# Patient Record
Sex: Male | Born: 1955 | Race: White | Hispanic: No | Marital: Married | Smoking: Never smoker
Health system: Southern US, Community
[De-identification: ages and names within clinical notes are randomized; demographics above are authoritative.]

## PROBLEM LIST (undated history)

## (undated) DIAGNOSIS — J309 Allergic rhinitis, unspecified: Secondary | ICD-10-CM

## (undated) DIAGNOSIS — I5030 Unspecified diastolic (congestive) heart failure: Secondary | ICD-10-CM

## (undated) DIAGNOSIS — R519 Headache, unspecified: Secondary | ICD-10-CM

## (undated) DIAGNOSIS — I509 Heart failure, unspecified: Secondary | ICD-10-CM

## (undated) DIAGNOSIS — K760 Fatty (change of) liver, not elsewhere classified: Secondary | ICD-10-CM

## (undated) DIAGNOSIS — N183 Chronic kidney disease, stage 3 unspecified: Secondary | ICD-10-CM

## (undated) DIAGNOSIS — I77811 Abdominal aortic ectasia: Secondary | ICD-10-CM

## (undated) DIAGNOSIS — Z7901 Long term (current) use of anticoagulants: Secondary | ICD-10-CM

## (undated) DIAGNOSIS — E785 Hyperlipidemia, unspecified: Secondary | ICD-10-CM

## (undated) DIAGNOSIS — I700 Atherosclerosis of aorta: Secondary | ICD-10-CM

## (undated) DIAGNOSIS — F32A Depression, unspecified: Secondary | ICD-10-CM

## (undated) DIAGNOSIS — G4730 Sleep apnea, unspecified: Secondary | ICD-10-CM

## (undated) DIAGNOSIS — M545 Low back pain, unspecified: Secondary | ICD-10-CM

## (undated) DIAGNOSIS — I4891 Unspecified atrial fibrillation: Secondary | ICD-10-CM

## (undated) DIAGNOSIS — M5481 Occipital neuralgia: Secondary | ICD-10-CM

## (undated) DIAGNOSIS — I10 Essential (primary) hypertension: Secondary | ICD-10-CM

## (undated) DIAGNOSIS — I209 Angina pectoris, unspecified: Secondary | ICD-10-CM

## (undated) DIAGNOSIS — D751 Secondary polycythemia: Secondary | ICD-10-CM

## (undated) DIAGNOSIS — N281 Cyst of kidney, acquired: Secondary | ICD-10-CM

## (undated) DIAGNOSIS — E039 Hypothyroidism, unspecified: Secondary | ICD-10-CM

## (undated) DIAGNOSIS — Z79899 Other long term (current) drug therapy: Secondary | ICD-10-CM

## (undated) DIAGNOSIS — E291 Testicular hypofunction: Secondary | ICD-10-CM

## (undated) DIAGNOSIS — R600 Localized edema: Secondary | ICD-10-CM

## (undated) DIAGNOSIS — K219 Gastro-esophageal reflux disease without esophagitis: Secondary | ICD-10-CM

## (undated) DIAGNOSIS — R0609 Other forms of dyspnea: Secondary | ICD-10-CM

## (undated) DIAGNOSIS — G5603 Carpal tunnel syndrome, bilateral upper limbs: Secondary | ICD-10-CM

## (undated) DIAGNOSIS — I5189 Other ill-defined heart diseases: Secondary | ICD-10-CM

## (undated) DIAGNOSIS — E669 Obesity, unspecified: Secondary | ICD-10-CM

## (undated) DIAGNOSIS — I359 Nonrheumatic aortic valve disorder, unspecified: Secondary | ICD-10-CM

## (undated) DIAGNOSIS — E119 Type 2 diabetes mellitus without complications: Secondary | ICD-10-CM

## (undated) DIAGNOSIS — N529 Male erectile dysfunction, unspecified: Secondary | ICD-10-CM

## (undated) DIAGNOSIS — G43909 Migraine, unspecified, not intractable, without status migrainosus: Secondary | ICD-10-CM

## (undated) DIAGNOSIS — M75101 Unspecified rotator cuff tear or rupture of right shoulder, not specified as traumatic: Secondary | ICD-10-CM

## (undated) DIAGNOSIS — L80 Vitiligo: Secondary | ICD-10-CM

## (undated) DIAGNOSIS — Z974 Presence of external hearing-aid: Secondary | ICD-10-CM

## (undated) DIAGNOSIS — I77810 Thoracic aortic ectasia: Secondary | ICD-10-CM

## (undated) DIAGNOSIS — E041 Nontoxic single thyroid nodule: Secondary | ICD-10-CM

## (undated) DIAGNOSIS — R918 Other nonspecific abnormal finding of lung field: Secondary | ICD-10-CM

## (undated) DIAGNOSIS — N400 Enlarged prostate without lower urinary tract symptoms: Secondary | ICD-10-CM

## (undated) DIAGNOSIS — G5602 Carpal tunnel syndrome, left upper limb: Secondary | ICD-10-CM

## (undated) DIAGNOSIS — R7881 Bacteremia: Secondary | ICD-10-CM

## (undated) DIAGNOSIS — M1990 Unspecified osteoarthritis, unspecified site: Secondary | ICD-10-CM

## (undated) DIAGNOSIS — F329 Major depressive disorder, single episode, unspecified: Secondary | ICD-10-CM

## (undated) DIAGNOSIS — G8929 Other chronic pain: Secondary | ICD-10-CM

## (undated) DIAGNOSIS — I340 Nonrheumatic mitral (valve) insufficiency: Secondary | ICD-10-CM

## (undated) DIAGNOSIS — G4733 Obstructive sleep apnea (adult) (pediatric): Secondary | ICD-10-CM

## (undated) DIAGNOSIS — T884XXA Failed or difficult intubation, initial encounter: Secondary | ICD-10-CM

## (undated) DIAGNOSIS — F411 Generalized anxiety disorder: Secondary | ICD-10-CM

## (undated) HISTORY — PX: COLONOSCOPY: SHX174

## (undated) HISTORY — PX: APPENDECTOMY: SHX54

## (undated) HISTORY — PX: FINGER SURGERY: SHX640

## (undated) HISTORY — PX: MANDIBLE SURGERY: SHX707

## (undated) HISTORY — PX: THYROIDECTOMY, PARTIAL: SHX18

## (undated) HISTORY — PX: TONSILLECTOMY AND ADENOIDECTOMY: SUR1326

## (undated) HISTORY — PX: ESOPHAGOGASTRODUODENOSCOPY: SHX1529

## (undated) HISTORY — PX: TONSILLECTOMY: SUR1361

## (undated) HISTORY — PX: LASIK: SHX215

---

## 2003-11-23 HISTORY — PX: CARPAL TUNNEL RELEASE: SHX101

## 2003-12-14 ENCOUNTER — Ambulatory Visit: Payer: Self-pay | Admitting: Unknown Physician Specialty

## 2004-10-04 ENCOUNTER — Ambulatory Visit: Payer: Self-pay | Admitting: Otolaryngology

## 2005-02-08 ENCOUNTER — Ambulatory Visit: Payer: Self-pay

## 2005-02-22 HISTORY — PX: ROTATOR CUFF REPAIR: SHX139

## 2005-03-06 ENCOUNTER — Ambulatory Visit: Payer: Self-pay | Admitting: Unknown Physician Specialty

## 2006-01-17 ENCOUNTER — Ambulatory Visit: Payer: Self-pay | Admitting: Internal Medicine

## 2006-04-03 ENCOUNTER — Ambulatory Visit: Payer: Self-pay | Admitting: Family Medicine

## 2006-08-19 ENCOUNTER — Ambulatory Visit: Payer: Self-pay | Admitting: Unknown Physician Specialty

## 2007-06-23 HISTORY — PX: EXCISION NEUROMA: SHX6350

## 2008-10-28 ENCOUNTER — Ambulatory Visit: Payer: Self-pay | Admitting: Unknown Physician Specialty

## 2012-01-21 ENCOUNTER — Ambulatory Visit: Payer: Self-pay | Admitting: Unknown Physician Specialty

## 2012-01-24 LAB — PATHOLOGY REPORT

## 2012-08-22 ENCOUNTER — Ambulatory Visit: Payer: Self-pay | Admitting: Unknown Physician Specialty

## 2012-11-17 ENCOUNTER — Ambulatory Visit: Payer: Self-pay | Admitting: Neurology

## 2012-11-17 LAB — CREATININE, SERUM: EGFR (Non-African Amer.): 58 — ABNORMAL LOW

## 2013-08-13 DIAGNOSIS — E291 Testicular hypofunction: Secondary | ICD-10-CM | POA: Insufficient documentation

## 2013-08-13 DIAGNOSIS — K219 Gastro-esophageal reflux disease without esophagitis: Secondary | ICD-10-CM | POA: Insufficient documentation

## 2013-08-13 DIAGNOSIS — G5602 Carpal tunnel syndrome, left upper limb: Secondary | ICD-10-CM | POA: Insufficient documentation

## 2014-02-24 ENCOUNTER — Emergency Department: Payer: Self-pay | Admitting: Emergency Medicine

## 2014-02-24 LAB — COMPREHENSIVE METABOLIC PANEL
ALK PHOS: 79 U/L (ref 46–116)
ALT: 26 U/L (ref 14–63)
AST: 24 U/L (ref 15–37)
Albumin: 3.6 g/dL (ref 3.4–5.0)
Anion Gap: 4 — ABNORMAL LOW (ref 7–16)
BUN: 13 mg/dL (ref 7–18)
Bilirubin,Total: 0.3 mg/dL (ref 0.2–1.0)
CREATININE: 1.22 mg/dL (ref 0.60–1.30)
Calcium, Total: 9 mg/dL (ref 8.5–10.1)
Chloride: 105 mmol/L (ref 98–107)
Co2: 29 mmol/L (ref 21–32)
EGFR (African American): >60
EGFR (Non-African Amer.): >60
GLUCOSE: 93 mg/dL (ref 65–99)
Osmolality: 275 (ref 275–301)
POTASSIUM: 3.8 mmol/L (ref 3.5–5.1)
Sodium: 138 mmol/L (ref 136–145)
TOTAL PROTEIN: 7.8 g/dL (ref 6.4–8.2)

## 2014-02-24 LAB — CBC
HCT: 44.4 % (ref 40.0–52.0)
HGB: 14.4 g/dL (ref 13.0–18.0)
MCH: 27 pg (ref 26.0–34.0)
MCHC: 32.4 g/dL (ref 32.0–36.0)
MCV: 83 fL (ref 80–100)
Platelet: 185 10*3/uL (ref 150–440)
RBC: 5.32 10*6/uL (ref 4.40–5.90)
RDW: 14.5 % (ref 11.5–14.5)
WBC: 7.7 10*3/uL (ref 3.8–10.6)

## 2015-12-23 HISTORY — PX: PROSTATE BIOPSY: SHX241

## 2016-07-06 ENCOUNTER — Other Ambulatory Visit: Payer: Self-pay | Admitting: Otolaryngology

## 2016-07-06 DIAGNOSIS — E041 Nontoxic single thyroid nodule: Secondary | ICD-10-CM

## 2016-07-11 ENCOUNTER — Ambulatory Visit
Admission: RE | Admit: 2016-07-11 | Discharge: 2016-07-11 | Disposition: A | Discharge status: Home / Self Care | Payer: BLUE CROSS/BLUE SHIELD | Source: Ambulatory Visit | Attending: Otolaryngology | Admitting: Otolaryngology

## 2016-07-11 DIAGNOSIS — E065 Other chronic thyroiditis: Secondary | ICD-10-CM | POA: Insufficient documentation

## 2016-07-11 DIAGNOSIS — E041 Nontoxic single thyroid nodule: Secondary | ICD-10-CM | POA: Diagnosis not present

## 2016-07-11 DIAGNOSIS — E890 Postprocedural hypothyroidism: Secondary | ICD-10-CM | POA: Diagnosis not present

## 2016-07-11 HISTORY — DX: Essential (primary) hypertension: I10

## 2016-07-11 HISTORY — DX: Hypothyroidism, unspecified: E03.9

## 2016-07-11 HISTORY — DX: Sleep apnea, unspecified: G47.30

## 2016-07-11 HISTORY — DX: Hyperlipidemia, unspecified: E78.5

## 2016-07-11 NOTE — Procedures (Signed)
   [facility] thyroid nodule 25g x5 to cytopath No complication No blood loss. See [facility]ation [facility].  Thora Lance. Amadea Keagy MD Main # [phone] Pager  [phone]

## 2016-07-12 LAB — CYTOLOGY - NON PAP

## 2016-09-06 DIAGNOSIS — N182 Chronic kidney disease, stage 2 (mild): Secondary | ICD-10-CM | POA: Insufficient documentation

## 2017-01-29 DIAGNOSIS — G8929 Other chronic pain: Secondary | ICD-10-CM | POA: Insufficient documentation

## 2017-02-12 ENCOUNTER — Other Ambulatory Visit: Payer: Self-pay | Admitting: Unknown Physician Specialty

## 2017-02-12 DIAGNOSIS — M5416 Radiculopathy, lumbar region: Secondary | ICD-10-CM

## 2017-02-14 ENCOUNTER — Other Ambulatory Visit: Payer: Self-pay | Admitting: Urology

## 2017-02-14 DIAGNOSIS — N281 Cyst of kidney, acquired: Secondary | ICD-10-CM

## 2017-02-19 ENCOUNTER — Ambulatory Visit
Admission: RE | Admit: 2017-02-19 | Discharge: 2017-02-19 | Disposition: A | Discharge status: Home / Self Care | Payer: BLUE CROSS/BLUE SHIELD | Source: Ambulatory Visit | Attending: Urology | Admitting: Urology

## 2017-02-19 DIAGNOSIS — K769 Liver disease, unspecified: Secondary | ICD-10-CM | POA: Insufficient documentation

## 2017-02-19 DIAGNOSIS — Q6102 Congenital multiple renal cysts: Secondary | ICD-10-CM | POA: Insufficient documentation

## 2017-02-19 DIAGNOSIS — N281 Cyst of kidney, acquired: Secondary | ICD-10-CM | POA: Diagnosis present

## 2017-02-20 ENCOUNTER — Ambulatory Visit
Admission: RE | Admit: 2017-02-20 | Discharge: 2017-02-20 | Disposition: A | Discharge status: Home / Self Care | Payer: BLUE CROSS/BLUE SHIELD | Source: Ambulatory Visit | Attending: Unknown Physician Specialty | Admitting: Unknown Physician Specialty

## 2017-02-20 ENCOUNTER — Other Ambulatory Visit: Payer: Self-pay | Admitting: Unknown Physician Specialty

## 2017-02-20 DIAGNOSIS — M5416 Radiculopathy, lumbar region: Secondary | ICD-10-CM

## 2017-02-20 DIAGNOSIS — K760 Fatty (change of) liver, not elsewhere classified: Secondary | ICD-10-CM | POA: Insufficient documentation

## 2017-02-20 MED ORDER — METHYLPREDNISOLONE ACETATE 40 MG/ML INJ SUSP (RADIOLOG
120.0000 mg | Freq: Once | INTRAMUSCULAR | Status: AC
  Administered 2017-02-20: 120 mg via EPIDURAL

## 2017-02-20 MED ORDER — IOPAMIDOL (ISOVUE-M 200) INJECTION 41%
1.0000 mL | Freq: Once | INTRAMUSCULAR | Status: AC
  Administered 2017-02-20: 1 mL via EPIDURAL

## 2017-02-20 NOTE — Discharge Instructions (Signed)

## 2017-05-01 ENCOUNTER — Other Ambulatory Visit: Payer: Self-pay | Admitting: Orthopedic Surgery

## 2017-05-01 DIAGNOSIS — M5441 Lumbago with sciatica, right side: Secondary | ICD-10-CM

## 2017-05-01 DIAGNOSIS — M5440 Lumbago with sciatica, unspecified side: Principal | ICD-10-CM

## 2017-05-01 DIAGNOSIS — M545 Low back pain, unspecified: Secondary | ICD-10-CM

## 2017-05-01 DIAGNOSIS — G8929 Other chronic pain: Secondary | ICD-10-CM

## 2017-05-13 ENCOUNTER — Other Ambulatory Visit: Payer: Self-pay | Admitting: Orthopedic Surgery

## 2017-05-13 ENCOUNTER — Ambulatory Visit
Admission: RE | Admit: 2017-05-13 | Discharge: 2017-05-13 | Disposition: A | Discharge status: Home / Self Care | Payer: BLUE CROSS/BLUE SHIELD | Source: Ambulatory Visit | Attending: Orthopedic Surgery | Admitting: Orthopedic Surgery

## 2017-05-13 DIAGNOSIS — M5441 Lumbago with sciatica, right side: Secondary | ICD-10-CM

## 2017-05-13 DIAGNOSIS — G8929 Other chronic pain: Secondary | ICD-10-CM

## 2017-05-13 DIAGNOSIS — M545 Low back pain, unspecified: Secondary | ICD-10-CM

## 2017-05-13 DIAGNOSIS — M5440 Lumbago with sciatica, unspecified side: Principal | ICD-10-CM

## 2017-05-13 MED ORDER — IOPAMIDOL (ISOVUE-M 200) INJECTION 41%
1.0000 mL | Freq: Once | INTRAMUSCULAR | Status: AC
  Administered 2017-05-13: 1 mL via EPIDURAL

## 2017-05-13 MED ORDER — METHYLPREDNISOLONE ACETATE 40 MG/ML INJ SUSP (RADIOLOG
120.0000 mg | Freq: Once | INTRAMUSCULAR | Status: AC
  Administered 2017-05-13: 120 mg via EPIDURAL

## 2017-08-29 ENCOUNTER — Other Ambulatory Visit: Payer: Self-pay | Admitting: Otolaryngology

## 2017-08-29 DIAGNOSIS — E041 Nontoxic single thyroid nodule: Secondary | ICD-10-CM

## 2017-09-10 ENCOUNTER — Ambulatory Visit
Admission: RE | Admit: 2017-09-10 | Discharge: 2017-09-10 | Disposition: A | Discharge status: Home / Self Care | Payer: BLUE CROSS/BLUE SHIELD | Source: Ambulatory Visit | Attending: Otolaryngology | Admitting: Otolaryngology

## 2017-09-10 ENCOUNTER — Encounter (INDEPENDENT_AMBULATORY_CARE_PROVIDER_SITE_OTHER): Payer: Self-pay

## 2017-09-10 DIAGNOSIS — E041 Nontoxic single thyroid nodule: Secondary | ICD-10-CM | POA: Diagnosis not present

## 2017-09-16 ENCOUNTER — Other Ambulatory Visit: Payer: Self-pay | Admitting: Otolaryngology

## 2017-09-16 DIAGNOSIS — E041 Nontoxic single thyroid nodule: Secondary | ICD-10-CM

## 2017-09-25 ENCOUNTER — Ambulatory Visit
Admission: RE | Admit: 2017-09-25 | Discharge: 2017-09-25 | Disposition: A | Discharge status: Home / Self Care | Payer: BLUE CROSS/BLUE SHIELD | Source: Ambulatory Visit | Attending: Otolaryngology | Admitting: Otolaryngology

## 2017-09-25 DIAGNOSIS — E041 Nontoxic single thyroid nodule: Secondary | ICD-10-CM | POA: Diagnosis present

## 2017-09-25 NOTE — Discharge Instructions (Signed)
Thyroid Biopsy, Care After °Refer to this sheet in the next few weeks. These instructions provide you with information on caring for yourself after your procedure. Your health care provider may also give you more specific instructions. Your treatment has been planned according to current medical practices, but problems sometimes occur. Call your health care provider if you have any problems or questions after your procedure. °What can I expect after the procedure? °After your procedure, it is typical to have the following: °· You may have soreness and tenderness at the biopsy site for a few days. °· You may have a sore throat or a hoarse voice if you had an open biopsy. This should go away after a couple days. ° °Follow these instructions at home: °· Take medicines only as directed by your health care provider. °· To ease discomfort at the biopsy site: °? Keep your head raised on a pillow when you are lying down. °? Support the back of your head and neck with both hands as you sit up from a lying position. °· If you have a sore throat, try using throat lozenges or gargling with warm salt water. °· Keep all follow-up visits as directed by your health care provider. This is important. °Contact a health care provider if: °· You have a fever. °Get help right away if: °· You have severe bleeding from the biopsy site. °· You have difficulty swallowing. °· You have drainage, redness, swelling, or pain at the biopsy site. °· You have swollen glands (lymph nodes) in your neck. °This information is not intended to replace advice given to you by your health care provider. Make sure you discuss any questions you have with your health care provider. °Document Released: 08/05/2013 Document Revised: 09/11/2015 Document Reviewed: 04/02/2013 °Elsevier Interactive Patient Education © 2018 Elsevier Inc. ° °

## 2017-09-25 NOTE — Procedures (Signed)
US guided FNA of left mid thyroid nodule.  Total of 10 FNAs performed.  Minimal blood loss and no immediate complication.

## 2017-09-27 LAB — CYTOLOGY - NON PAP

## 2017-10-24 ENCOUNTER — Other Ambulatory Visit: Payer: Self-pay | Admitting: Internal Medicine

## 2017-10-24 DIAGNOSIS — I714 Abdominal aortic aneurysm, without rupture, unspecified: Secondary | ICD-10-CM

## 2017-11-13 ENCOUNTER — Other Ambulatory Visit: Payer: Self-pay

## 2017-11-13 ENCOUNTER — Encounter: Payer: Self-pay | Admitting: *Deleted

## 2017-11-14 ENCOUNTER — Ambulatory Visit
Admission: RE | Admit: 2017-11-14 | Discharge: 2017-11-14 | Disposition: A | Discharge status: Home / Self Care | Payer: BLUE CROSS/BLUE SHIELD | Source: Ambulatory Visit | Attending: Internal Medicine | Admitting: Internal Medicine

## 2017-11-14 DIAGNOSIS — I77811 Abdominal aortic ectasia: Secondary | ICD-10-CM | POA: Diagnosis not present

## 2017-11-14 DIAGNOSIS — I700 Atherosclerosis of aorta: Secondary | ICD-10-CM | POA: Insufficient documentation

## 2017-11-14 DIAGNOSIS — I714 Abdominal aortic aneurysm, without rupture, unspecified: Secondary | ICD-10-CM

## 2017-11-14 DIAGNOSIS — K760 Fatty (change of) liver, not elsewhere classified: Secondary | ICD-10-CM | POA: Diagnosis not present

## 2017-11-14 HISTORY — DX: Type 2 diabetes mellitus without complications: E11.9

## 2017-11-14 LAB — POCT I-STAT CREATININE: CREATININE: 1.1 mg/dL (ref 0.61–1.24)

## 2017-11-14 MED ORDER — IOPAMIDOL (ISOVUE-300) INJECTION 61%: 100.0000 mL | Freq: Once | INTRAVENOUS | Status: DC | PRN

## 2017-11-14 MED ORDER — IOPAMIDOL (ISOVUE-370) INJECTION 76%
100.0000 mL | Freq: Once | INTRAVENOUS | Status: AC | PRN
  Administered 2017-11-14: 100 mL via INTRAVENOUS

## 2017-11-19 ENCOUNTER — Encounter
Admission: RE | Disposition: A | Discharge status: Home / Self Care | Payer: Self-pay | Source: Ambulatory Visit | Attending: Unknown Physician Specialty

## 2017-11-19 ENCOUNTER — Ambulatory Visit: Payer: BLUE CROSS/BLUE SHIELD | Admitting: Anesthesiology

## 2017-11-19 ENCOUNTER — Ambulatory Visit
Admission: RE | Admit: 2017-11-19 | Discharge: 2017-11-19 | Disposition: A | Discharge status: Home / Self Care | Payer: BLUE CROSS/BLUE SHIELD | Source: Ambulatory Visit | Attending: Unknown Physician Specialty | Admitting: Unknown Physician Specialty

## 2017-11-19 DIAGNOSIS — G5602 Carpal tunnel syndrome, left upper limb: Secondary | ICD-10-CM | POA: Insufficient documentation

## 2017-11-19 DIAGNOSIS — Z79899 Other long term (current) drug therapy: Secondary | ICD-10-CM | POA: Diagnosis not present

## 2017-11-19 DIAGNOSIS — E039 Hypothyroidism, unspecified: Secondary | ICD-10-CM | POA: Insufficient documentation

## 2017-11-19 DIAGNOSIS — I10 Essential (primary) hypertension: Secondary | ICD-10-CM | POA: Insufficient documentation

## 2017-11-19 DIAGNOSIS — Z7982 Long term (current) use of aspirin: Secondary | ICD-10-CM | POA: Diagnosis not present

## 2017-11-19 DIAGNOSIS — E785 Hyperlipidemia, unspecified: Secondary | ICD-10-CM | POA: Diagnosis not present

## 2017-11-19 DIAGNOSIS — Z7984 Long term (current) use of oral hypoglycemic drugs: Secondary | ICD-10-CM | POA: Insufficient documentation

## 2017-11-19 DIAGNOSIS — E119 Type 2 diabetes mellitus without complications: Secondary | ICD-10-CM | POA: Insufficient documentation

## 2017-11-19 DIAGNOSIS — G4733 Obstructive sleep apnea (adult) (pediatric): Secondary | ICD-10-CM | POA: Insufficient documentation

## 2017-11-19 DIAGNOSIS — F329 Major depressive disorder, single episode, unspecified: Secondary | ICD-10-CM | POA: Insufficient documentation

## 2017-11-19 HISTORY — DX: Gastro-esophageal reflux disease without esophagitis: K21.9

## 2017-11-19 HISTORY — DX: Carpal tunnel syndrome, bilateral upper limbs: G56.03

## 2017-11-19 HISTORY — PX: CARPAL TUNNEL RELEASE: SHX101

## 2017-11-19 HISTORY — DX: Presence of external hearing-aid: Z97.4

## 2017-11-19 HISTORY — DX: Major depressive disorder, single episode, unspecified: F32.9

## 2017-11-19 HISTORY — DX: Depression, unspecified: F32.A

## 2017-11-19 LAB — GLUCOSE, CAPILLARY
Glucose-Capillary: 139 mg/dL — ABNORMAL HIGH (ref 70–99)
Glucose-Capillary: 107 mg/dL — ABNORMAL HIGH (ref 70–99)

## 2017-11-19 SURGERY — CARPAL TUNNEL RELEASE
Anesthesia: General | Site: Hand | Laterality: Left

## 2017-11-19 MED ORDER — ACETAMINOPHEN 325 MG PO TABS: 325.0000 mg | ORAL_TABLET | ORAL | Status: DC | PRN

## 2017-11-19 MED ORDER — OXYCODONE HCL 5 MG/5ML PO SOLN: 5.0000 mg | Freq: Once | ORAL | Status: DC | PRN

## 2017-11-19 MED ORDER — FENTANYL CITRATE (PF) 100 MCG/2ML IJ SOLN
INTRAMUSCULAR | Status: DC | PRN
  Administered 2017-11-19: 50 ug via INTRAVENOUS

## 2017-11-19 MED ORDER — PROMETHAZINE HCL 25 MG/ML IJ SOLN: 6.2500 mg | INTRAMUSCULAR | Status: DC | PRN

## 2017-11-19 MED ORDER — BUPIVACAINE HCL (PF) 0.5 % IJ SOLN
INTRAMUSCULAR | Status: DC | PRN
  Administered 2017-11-19: 5 mL

## 2017-11-19 MED ORDER — ONDANSETRON HCL 4 MG/2ML IJ SOLN
INTRAMUSCULAR | Status: DC | PRN
  Administered 2017-11-19: 4 mg via INTRAVENOUS

## 2017-11-19 MED ORDER — DEXAMETHASONE SODIUM PHOSPHATE 4 MG/ML IJ SOLN
INTRAMUSCULAR | Status: DC | PRN
  Administered 2017-11-19: 4 mg via INTRAVENOUS

## 2017-11-19 MED ORDER — LACTATED RINGERS IV SOLN
10.0000 mL/h | INTRAVENOUS | Status: DC
  Administered 2017-11-19: 10 mL/h via INTRAVENOUS

## 2017-11-19 MED ORDER — NORCO 5-325 MG PO TABS: 1.0000 | ORAL_TABLET | Freq: Four times a day (QID) | ORAL | 0 refills | Status: AC | PRN

## 2017-11-19 MED ORDER — EPHEDRINE SULFATE 50 MG/ML IJ SOLN
INTRAMUSCULAR | Status: DC | PRN
  Administered 2017-11-19 (×2): 5 mg via INTRAVENOUS

## 2017-11-19 MED ORDER — LIDOCAINE HCL (CARDIAC) PF 100 MG/5ML IV SOSY
PREFILLED_SYRINGE | INTRAVENOUS | Status: DC | PRN
  Administered 2017-11-19: 40 mg via INTRATRACHEAL

## 2017-11-19 MED ORDER — MIDAZOLAM HCL 5 MG/5ML IJ SOLN
INTRAMUSCULAR | Status: DC | PRN
  Administered 2017-11-19: 2 mg via INTRAVENOUS

## 2017-11-19 MED ORDER — ACETAMINOPHEN 160 MG/5ML PO SOLN: 325.0000 mg | ORAL | Status: DC | PRN

## 2017-11-19 MED ORDER — GLYCOPYRROLATE 0.2 MG/ML IJ SOLN
INTRAMUSCULAR | Status: DC | PRN
  Administered 2017-11-19: 0.1 mg via INTRAVENOUS

## 2017-11-19 MED ORDER — FENTANYL CITRATE (PF) 100 MCG/2ML IJ SOLN: 25.0000 ug | INTRAMUSCULAR | Status: DC | PRN

## 2017-11-19 MED ORDER — OXYCODONE HCL 5 MG PO TABS: 5.0000 mg | ORAL_TABLET | Freq: Once | ORAL | Status: DC | PRN

## 2017-11-19 SURGICAL SUPPLY — 26 items
BANDAGE ELASTIC 2 LF NS (GAUZE/BANDAGES/DRESSINGS) ×3 IMPLANT
BNDG ESMARK 4X12 TAN STRL LF (GAUZE/BANDAGES/DRESSINGS) ×3 IMPLANT
COVER LIGHT HANDLE FLEXIBLE (MISCELLANEOUS) ×3 IMPLANT
CUFF TOURNIQUET DUAL PORT 30IN (TOURNIQUET CUFF) ×3 IMPLANT
DURAPREP 26ML APPLICATOR (WOUND CARE) ×3 IMPLANT
ELECT REM PT RETURN 9FT ADLT (ELECTROSURGICAL) ×3
ELECTRODE REM PT RTRN 9FT ADLT (ELECTROSURGICAL) ×1 IMPLANT
GAUZE SPONGE 4X4 12PLY STRL (GAUZE/BANDAGES/DRESSINGS) ×3 IMPLANT
GLOVE BIO SURGEON STRL SZ7.5 (GLOVE) ×6 IMPLANT
GLOVE BIO SURGEON STRL SZ8 (GLOVE) ×6 IMPLANT
GLOVE INDICATOR 8.0 STRL GRN (GLOVE) ×6 IMPLANT
GOWN STRL REUS W/ TWL LRG LVL3 (GOWN DISPOSABLE) ×2 IMPLANT
GOWN STRL REUS W/TWL LRG LVL3 (GOWN DISPOSABLE) ×4
KIT TURNOVER KIT A (KITS) ×3 IMPLANT
NS IRRIG 500ML POUR BTL (IV SOLUTION) ×3 IMPLANT
PACK EXTREMITY ARMC (MISCELLANEOUS) ×3 IMPLANT
PADDING CAST 2X4YD ST (MISCELLANEOUS) ×2
PADDING CAST BLEND 2X4 STRL (MISCELLANEOUS) ×1 IMPLANT
SOL PREP PVP 2OZ (MISCELLANEOUS) ×3
SOLUTION PREP PVP 2OZ (MISCELLANEOUS) ×1 IMPLANT
SPLINT CAST 1 STEP 3X12 (MISCELLANEOUS) ×3 IMPLANT
STOCKINETTE 4X48 STRL (DRAPES) ×3 IMPLANT
STRAP BODY AND KNEE 60X3 (MISCELLANEOUS) ×3 IMPLANT
SUT ETHILON 4-0 (SUTURE) ×2
SUT ETHILON 4-0 FS2 18XMFL BLK (SUTURE) ×1
SUTURE ETHLN 4-0 FS2 18XMF BLK (SUTURE) ×1 IMPLANT

## 2017-11-19 NOTE — Anesthesia Postprocedure Evaluation (Signed)
Anesthesia Post Note  Patient: Jacob Lawson  Procedure(s) Performed: LEFT OPEN CARPAL TUNNEL RELEASE (Left Hand)  Patient location during evaluation: PACU Anesthesia Type: General Level of consciousness: awake and alert Pain management: pain level controlled Vital Signs Assessment: post-procedure vital signs reviewed and stable Respiratory status: spontaneous breathing, nonlabored ventilation, respiratory function stable and patient connected to nasal cannula oxygen Cardiovascular status: blood pressure returned to baseline and stable Postop Assessment: no apparent nausea or vomiting Anesthetic complications: no    DANIEL D KOVACS

## 2017-11-19 NOTE — Transfer of Care (Signed)
Immediate Anesthesia Transfer of Care Note  Patient: Jacob Lawson  Procedure(s) Performed: LEFT OPEN CARPAL TUNNEL RELEASE (Left Hand)  Patient Location: PACU  Anesthesia Type: General  Level of Consciousness: awake, alert  and patient cooperative  Airway and Oxygen Therapy: Patient Spontanous Breathing and Patient connected to supplemental oxygen  Post-op Assessment: Post-op Vital signs reviewed, Patient's Cardiovascular Status Stable, Respiratory Function Stable, Patent Airway and No signs of Nausea or vomiting  Post-op Vital Signs: Reviewed and stable  Complications: No apparent anesthesia complications

## 2017-11-19 NOTE — Op Note (Signed)
DATE OF SURGERY:  11/19/2017  PATIENT NAME:  Jacob Lawson   DOB: Sep 05, 1955  MRN: [id number]  PRE-OPERATIVE DIAGNOSIS left carpal tunnel syndrome  POST-OPERATIVE DIAGNOSIS:  Same  PROCEDURE: Left carpal tunnel release  SURGEON: Dr. Erin Sons, Montez Hageman. M.D.  ANESTHESIA: General   INDICATIONS FOR SURGERY: Jacob Lawson is a 62 y.o. year old male with a long history of numbness and paresthesias in the left hand. Nerve conduction studies demonstrated findings consistent with moderately severe median nerve compression.The patient had not seen any significant improvement despite conservative nonsurgical intervention. After discussion of the risks and benefits of surgical intervention, the patient expressed understanding of the risks benefits and agreed with plans for carpal tunnel release.   PROCEDURE IN DETAIL: The patient was taken the operating room where satisfactory general anesthesia was achieved. A tourniquet was placed on the patient's left upper arm.The left hand and arm were prepped  and draped in the usual sterile fashion. A "time-out" was performed as per usual protocol. The hand and forearm were exsanguinated using an Esmarch and the tourniquet was inflated to 250 mmHg.  An incision was made just ulnar to the thenar palmar crease. Dissection was carried down through the palmar fascia to the transverse carpal ligament. The transverse carpal ligament was sharply incised, taking care to protect the underlying structures within the carpal tunnel. Complete release of the transverse carpal ligament was achieved. There was no evidence of a mass or proliferative synovitis within the carpal tunnel. The median nerve did appear to be slightly flattened. The wound was irrigated with saline. The tourniquet was released at this time. It had been up for about 14 minutes. Bleeding was controlled with digital pressure and coagulation cautery. I did inject the subcutaneous tissue of the wound with about 5  cc of 0.5% Marcaine without epinephrine. The skin was then re-approximated with interrupted sutures of #4-0 nylon. A sterile dressing was applied followed by application of a volar splint.  The patient was awakened and transferred to a stretcher bed.  The patient tolerated the procedure well and was transported to the PACU in stable condition. Blood loss was negligible.  Dr. Isidoro Donning. M.D.

## 2017-11-19 NOTE — Anesthesia Preprocedure Evaluation (Addendum)
Anesthesia Evaluation  Patient identified by MRN, date of birth, ID band Patient awake    Reviewed: Allergy & Precautions, H&P , NPO status , Patient's Chart, lab work & pertinent test results, reviewed documented beta blocker date and time   Airway Mallampati: III  TM Distance: >3 FB Neck ROM: full    Dental no notable dental hx.    Pulmonary sleep apnea and Continuous Positive Airway Pressure Ventilation ,    Pulmonary exam normal breath sounds clear to auscultation       Cardiovascular Exercise Tolerance: Good hypertension,  Rhythm:regular Rate:Normal     Neuro/Psych negative neurological ROS  negative psych ROS   GI/Hepatic Neg liver ROS, GERD  Controlled,  Endo/Other  diabetesHypothyroidism   Renal/GU negative Renal ROS  negative genitourinary   Musculoskeletal   Abdominal   Peds  Hematology negative hematology ROS (+)   Anesthesia Other Findings   Reproductive/Obstetrics negative OB ROS                            Anesthesia Physical Anesthesia Plan  ASA: II  Anesthesia Plan: General   Post-op Pain Management:    Induction:   PONV Risk Score and Plan: 2 and Midazolam and Ondansetron  Airway Management Planned:   Additional Equipment:   Intra-op Plan:   Post-operative Plan:   Informed Consent: I have reviewed the patients History and Physical, chart, labs and discussed the procedure including the risks, benefits and alternatives for the proposed anesthesia with the patient or authorized representative who has indicated his/her understanding and acceptance.     Plan Discussed with: CRNA  Anesthesia Plan Comments:        Anesthesia Quick Evaluation

## 2017-11-19 NOTE — Anesthesia Procedure Notes (Signed)
Procedure Name: LMA Insertion Date/Time: 11/19/2017 10:48 AM Performed by: Jimmy Picket, CRNA Pre-anesthesia Checklist: Patient identified, Emergency Drugs available, Suction available, Timeout performed and Patient being monitored Patient Re-evaluated:Patient Re-evaluated prior to induction Oxygen Delivery Method: Circle system utilized Preoxygenation: Pre-oxygenation with 100% oxygen Induction Type: IV induction LMA: LMA inserted LMA Size: 4.0 Number of attempts: 1 Placement Confirmation: positive ETCO2 and breath sounds checked- equal and bilateral Tube secured with: Tape

## 2017-11-19 NOTE — H&P (Signed)
  H and P reviewed. No changes. Uploaded at later date. 

## 2017-11-19 NOTE — Discharge Instructions (Signed)
General Anesthesia, Adult, Care After °These instructions provide you with information about caring for yourself after your procedure. Your health care provider may also give you more specific instructions. Your treatment has been planned according to current medical practices, but problems sometimes occur. Call your health care provider if you have any problems or questions after your procedure. °What can I expect after the procedure? °After the procedure, it is common to have: °· Vomiting. °· A sore throat. °· Mental slowness. ° °It is common to feel: °· Nauseous. °· Cold or shivery. °· Sleepy. °· Tired. °· Sore or achy, even in parts of your body where you did not have surgery. ° °Follow these instructions at home: °For at least 24 hours after the procedure: °· Do not: °? Participate in activities where you could fall or become injured. °? Drive. °? Use heavy machinery. °? Drink alcohol. °? Take sleeping pills or medicines that cause drowsiness. °? Make important decisions or sign legal documents. °? Take care of children on your own. °· Rest. °Eating and drinking °· If you vomit, drink water, juice, or soup when you can drink without vomiting. °· Drink enough fluid to keep your urine clear or pale yellow. °· Make sure you have little or no nausea before eating solid foods. °· Follow the diet recommended by your health care provider. °General instructions °· Have a responsible adult stay with you until you are awake and alert. °· Return to your normal activities as told by your health care provider. Ask your health care provider what activities are safe for you. °· Take over-the-counter and prescription medicines only as told by your health care provider. °· If you smoke, do not smoke without supervision. °· Keep all follow-up visits as told by your health care provider. This is important. °Contact a health care provider if: °· You continue to have nausea or vomiting at home, and medicines are not helpful. °· You  cannot drink fluids or start eating again. °· You cannot urinate after 8-12 hours. °· You develop a skin rash. °· You have fever. °· You have increasing redness at the site of your procedure. °Get help right away if: °· You have difficulty breathing. °· You have chest pain. °· You have unexpected bleeding. °· You feel that you are having a life-threatening or urgent problem. °This information is not intended to replace advice given to you by your health care provider. Make sure you discuss any questions you have with your health care provider. °Document Released: 04/16/2000 Document Revised: 06/13/2015 Document Reviewed: 12/23/2014 °Elsevier Interactive Patient Education © 2018 Elsevier Inc. ° °Ice pack  ° °Elevation ° °RTC in about 2 weeks ° °

## 2017-12-30 ENCOUNTER — Ambulatory Visit
Admission: EM | Admit: 2017-12-30 | Discharge: 2017-12-30 | Disposition: A | Discharge status: Home / Self Care | Payer: BLUE CROSS/BLUE SHIELD

## 2017-12-30 ENCOUNTER — Encounter: Payer: Self-pay | Admitting: Emergency Medicine

## 2017-12-30 ENCOUNTER — Other Ambulatory Visit: Payer: Self-pay

## 2017-12-30 ENCOUNTER — Ambulatory Visit
Admission: EM | Admit: 2017-12-30 | Discharge: 2017-12-30 | Disposition: A | Discharge status: Home / Self Care | Payer: BLUE CROSS/BLUE SHIELD | Attending: Family Medicine | Admitting: Family Medicine

## 2017-12-30 DIAGNOSIS — J0100 Acute maxillary sinusitis, unspecified: Secondary | ICD-10-CM | POA: Diagnosis not present

## 2017-12-30 DIAGNOSIS — H1033 Unspecified acute conjunctivitis, bilateral: Secondary | ICD-10-CM | POA: Diagnosis not present

## 2017-12-30 MED ORDER — AMOXICILLIN-POT CLAVULANATE 875-125 MG PO TABS: 1.0000 | ORAL_TABLET | Freq: Two times a day (BID) | ORAL | 0 refills | Status: DC

## 2017-12-30 MED ORDER — POLYMYXIN B-TRIMETHOPRIM 10000-0.1 UNIT/ML-% OP SOLN: 1.0000 [drp] | Freq: Four times a day (QID) | OPHTHALMIC | 0 refills | Status: AC

## 2017-12-30 MED ORDER — HYDROCOD POLST-CPM POLST ER 10-8 MG/5ML PO SUER: 5.0000 mL | Freq: Two times a day (BID) | ORAL | 0 refills | Status: DC | PRN

## 2017-12-30 NOTE — Discharge Instructions (Signed)
Rest.  Medications as prescribed.  Take care  Dr. Sherhonda Gaspar  

## 2017-12-30 NOTE — ED Triage Notes (Signed)
Patient c/o cough, sinus congestion, facial pain and pressure x 3 weeks. Patient has taken Sudafed OTC for his symptoms.

## 2017-12-30 NOTE — ED Provider Notes (Signed)
[facility]    CSN: [id number] Arrival date & time: 12/30/17  1751  History   Chief Complaint Chief Complaint  Patient presents with  . Cough  . Facial Pain   HPI  62 year old male presents with respiratory symptoms.  Patient states that he has been sick for the past complaints.  Started on 11/30.  Patient reports congestion, hoarseness.  Has failed improvement with over-the-counter Sudafed and other treatments.  He has now developed sinus pain and pressure for the past few days.  Located on the right side.  Continues to be hoarse.  Also has had eye redness, irritation, and drainage.  His wife has been sick with a similar illness.  No reports of fever.  No other associated symptoms.  No other complaints.  History reviewed and updated as below. Past Medical History:  Diagnosis Date  . Carpal tunnel syndrome on both sides   . Depression   . Diabetes mellitus without complication (HCC)   . GERD (gastroesophageal reflux disease)   . Hyperlipidemia   . Hypertension   . Hypothyroidism   . Sleep apnea    CPAP  . Wears hearing aid in both ears     There are no active problems to display for this patient.   Past Surgical History:  Procedure Laterality Date  . APPENDECTOMY    . CARPAL TUNNEL RELEASE Right 11/2003  . CARPAL TUNNEL RELEASE Left 11/19/2017   Procedure: LEFT OPEN CARPAL TUNNEL RELEASE;  Surgeon: Erin SonsKernodle, Harold, MD;  [facility];  Service: Orthopedics;  Laterality: Left;  Diabetic - oral meds Sleep Apnea  . COLONOSCOPY     2/91, 6/00, 01/04, 7/08, 10/10, 12/13, 02/26/17  . ESOPHAGOGASTRODUODENOSCOPY     2/91, 1/95, 6/00, 1/04, 7/08, 10/10  . EXCISION NEUROMA Left 06/2007   foot  . FINGER SURGERY     for congenital webbing  . MANDIBLE SURGERY     in late 30s, fo overbite  . PROSTATE BIOPSY  12/2015  . ROTATOR CUFF REPAIR Left 02/2005  . THYROIDECTOMY, PARTIAL    . TONSILLECTOMY     and adenoidectomy       Home Medications     Prior to Admission medications   Medication Sig Start Date End Date Taking? Authorizing Provider  aspirin 81 MG tablet Take 81 mg by mouth daily.   Yes [provider]  Cholecalciferol (VITAMIN D3) 2000 units TABS Take 1 tablet by mouth daily.   Yes [provider]  co-enzyme Q-10 50 MG capsule Take 50 mg by mouth daily.   Yes [provider]  esomeprazole (NEXIUM) 20 MG packet Take 20 mg by mouth daily before breakfast.   Yes [provider]  finasteride (PROSCAR) 5 MG tablet Take 5 mg by mouth daily.   Yes [provider]  fluticasone (FLONASE) 50 MCG/ACT nasal spray Place 1 spray into both nostrils daily.   Yes [provider]  Boris LownKrill Oil 1000 MG CAPS Take 1 capsule by mouth 2 (two) times daily.   Yes [provider]  levothyroxine (SYNTHROID, LEVOTHROID) 125 MCG tablet Take 125 mcg by mouth daily before breakfast.   Yes [provider]  liothyronine (CYTOMEL) 25 MCG tablet Take by mouth daily.   Yes [provider]  lisinopril (PRINIVIL,ZESTRIL) 20 MG tablet Take 20 mg by mouth daily.   Yes [provider]  loratadine (CLARITIN) 10 MG tablet Take 10 mg by mouth daily as needed for allergies.   Yes [provider]  metFORMIN (GLUCOPHAGE) 500 MG tablet Take 1,000 mg by mouth daily with breakfast.   Yes [provider]  sertraline (ZOLOFT) 25 MG tablet Take 25 mg by mouth every other day.   Yes [provider]  TESTOSTERONE TD Place 1 % onto the skin 2 (two) times daily.   Yes [provider]  vitamin B-12 (CYANOCOBALAMIN) 500 MCG tablet Take 500 mcg by mouth daily.   Yes [provider]  amoxicillin-clavulanate (AUGMENTIN) 875-125 MG tablet Take 1 tablet by mouth every 12 (twelve) hours. 12/30/17   Tommie Sams, DO  chlorpheniramine-HYDROcodone (TUSSIONEX PENNKINETIC ER) 10-8 MG/5ML SUER Take 5 mLs by mouth every 12 (twelve) hours as needed. 12/30/17   Tommie Sams, DO  metoprolol succinate (TOPROL-XL) 50 MG 24 hr tablet Take 50 mg by mouth daily. Take with or immediately following a meal.    [provider]  Multiple Vitamins-Minerals (CENTRUM SILVER PO) Take by mouth daily.    [provider]  trimethoprim-polymyxin b (POLYTRIM) ophthalmic solution Place 1 drop into both eyes every 6 (six) hours for 5 days. 12/30/17 01/04/18  Tommie Sams, DO   Social History Social History   Tobacco Use  . Smoking status: Never Smoker  . Smokeless tobacco: Never Used  Substance Use Topics  . Alcohol use: Yes    Comment: occasional beer  . Drug use: No     Allergies   Lamictal [lamotrigine] and Statins   Review of Systems Review of Systems Per HPI  Physical Exam Triage Vital Signs ED Triage Vitals  Enc Vitals Group     BP 12/30/17 1810 (!) 150/95     Pulse Rate 12/30/17 1810 92     Resp 12/30/17 1810 18     Temp 12/30/17 1810 99 F (37.2 C)     Temp Source 12/30/17 1810 Oral     SpO2 12/30/17 1810 97 %     Weight 12/30/17 1809 265 lb (120.2 kg)     Height 12/30/17 1809 6' (1.829 m)     Head Circumference --      Peak Flow --      Pain Score 12/30/17 1808 10     Pain Loc --      Pain Edu? --      Excl. in GC? --    Updated Vital Signs BP (!) 150/95 (BP Location: Left Arm)   Pulse 92   Temp 99 F (37.2 C) (Oral)   Resp 18   Ht 6' (1.829 m)   Wt 120.2 kg   SpO2 97%   BMI 35.94 kg/m   Visual Acuity Right Eye Distance:   Left Eye Distance:   Bilateral Distance:    Right Eye Near:   Left Eye Near:    Bilateral Near:     Physical Exam  Constitutional: He is oriented to person, place, and time. He appears well-developed. No distress.  HENT:  Head: Normocephalic and atraumatic.  Mouth/Throat: Oropharynx is clear and moist.  Right maxillary sinus tenderness palpation.  Eyes:  Bilateral conjunctival injection.  Visible purulent discharge noted bilaterally.  Cardiovascular: Normal rate and regular  rhythm.  Pulmonary/Chest: Effort normal and breath sounds normal. He has no wheezes. He has no rales.  Neurological: He is alert and oriented to person, place, and time.  Psychiatric: He has a normal mood and affect. His behavior is normal.  Nursing note and vitals reviewed.  UC Treatments / Results  Labs (all labs ordered are listed, but only  abnormal results are displayed) Labs Reviewed - No data to display  EKG None  Radiology No results found.  Procedures Procedures (including critical care time)  Medications Ordered in UC Medications - No data to display  Initial Impression / Assessment and Plan / UC Course  I have reviewed the triage vital signs and the nursing notes.  Pertinent labs & imaging results that were available during my care of the patient were reviewed by me and considered in my medical decision making (see chart for details).    62 year old male presents with sinusitis and conjunctivitis.  Treating with Polytrim and Augmentin.  Tussionex for cough.  Final Clinical Impressions(s) / UC Diagnoses   Final diagnoses:  Acute maxillary sinusitis, recurrence not specified  Acute bacterial conjunctivitis of both eyes     Discharge Instructions     Rest.  Medications as prescribed.  Take care  Dr. Adriana Simas    ED Prescriptions    Medication Sig Dispense Auth. Provider   trimethoprim-polymyxin b (POLYTRIM) ophthalmic solution Place 1 drop into both eyes every 6 (six) hours for 5 days. 10 mL Collins Dimaria G, DO   amoxicillin-clavulanate (AUGMENTIN) 875-125 MG tablet Take 1 tablet by mouth every 12 (twelve) hours. 20 tablet [place], Aleksandr Pellow G, DO   chlorpheniramine-HYDROcodone (TUSSIONEX PENNKINETIC ER) 10-8 MG/5ML SUER Take 5 mLs by mouth every 12 (twelve) hours as needed. 115 mL Tommie Sams, DO     Controlled Substance Prescriptions [place] Controlled Substance Registry consulted? Not Applicable   Tommie Sams, DO 12/30/17 2158

## 2018-08-20 DIAGNOSIS — M67441 Ganglion, right hand: Secondary | ICD-10-CM | POA: Insufficient documentation

## 2018-11-07 ENCOUNTER — Emergency Department: Payer: BC Managed Care – PPO

## 2018-11-07 ENCOUNTER — Other Ambulatory Visit: Payer: Self-pay

## 2018-11-07 ENCOUNTER — Inpatient Hospital Stay
Admission: EM | Admit: 2018-11-07 | Discharge: 2018-11-10 | DRG: 853 | Disposition: A | Discharge status: Home Health | Payer: BC Managed Care – PPO | Attending: Internal Medicine | Admitting: Internal Medicine

## 2018-11-07 ENCOUNTER — Encounter: Payer: Self-pay | Admitting: Emergency Medicine

## 2018-11-07 DIAGNOSIS — L03116 Cellulitis of left lower limb: Secondary | ICD-10-CM | POA: Diagnosis present

## 2018-11-07 DIAGNOSIS — R509 Fever, unspecified: Secondary | ICD-10-CM | POA: Diagnosis not present

## 2018-11-07 DIAGNOSIS — Z7982 Long term (current) use of aspirin: Secondary | ICD-10-CM

## 2018-11-07 DIAGNOSIS — G8194 Hemiplegia, unspecified affecting left nondominant side: Secondary | ICD-10-CM | POA: Diagnosis present

## 2018-11-07 DIAGNOSIS — Z79890 Hormone replacement therapy (postmenopausal): Secondary | ICD-10-CM

## 2018-11-07 DIAGNOSIS — R4701 Aphasia: Secondary | ICD-10-CM | POA: Diagnosis present

## 2018-11-07 DIAGNOSIS — E8770 Fluid overload, unspecified: Secondary | ICD-10-CM | POA: Diagnosis present

## 2018-11-07 DIAGNOSIS — L089 Local infection of the skin and subcutaneous tissue, unspecified: Secondary | ICD-10-CM | POA: Diagnosis not present

## 2018-11-07 DIAGNOSIS — F419 Anxiety disorder, unspecified: Secondary | ICD-10-CM

## 2018-11-07 DIAGNOSIS — E1122 Type 2 diabetes mellitus with diabetic chronic kidney disease: Secondary | ICD-10-CM | POA: Diagnosis present

## 2018-11-07 DIAGNOSIS — A4181 Sepsis due to Enterococcus: Secondary | ICD-10-CM | POA: Diagnosis present

## 2018-11-07 DIAGNOSIS — N182 Chronic kidney disease, stage 2 (mild): Secondary | ICD-10-CM | POA: Diagnosis present

## 2018-11-07 DIAGNOSIS — R531 Weakness: Secondary | ICD-10-CM

## 2018-11-07 DIAGNOSIS — I10 Essential (primary) hypertension: Secondary | ICD-10-CM

## 2018-11-07 DIAGNOSIS — A498 Other bacterial infections of unspecified site: Secondary | ICD-10-CM | POA: Diagnosis not present

## 2018-11-07 DIAGNOSIS — J9601 Acute respiratory failure with hypoxia: Secondary | ICD-10-CM | POA: Diagnosis present

## 2018-11-07 DIAGNOSIS — L97521 Non-pressure chronic ulcer of other part of left foot limited to breakdown of skin: Secondary | ICD-10-CM

## 2018-11-07 DIAGNOSIS — M609 Myositis, unspecified: Secondary | ICD-10-CM | POA: Diagnosis present

## 2018-11-07 DIAGNOSIS — E785 Hyperlipidemia, unspecified: Secondary | ICD-10-CM | POA: Diagnosis not present

## 2018-11-07 DIAGNOSIS — L97529 Non-pressure chronic ulcer of other part of left foot with unspecified severity: Secondary | ICD-10-CM | POA: Diagnosis present

## 2018-11-07 DIAGNOSIS — R29701 NIHSS score 1: Secondary | ICD-10-CM | POA: Diagnosis present

## 2018-11-07 DIAGNOSIS — A4159 Other Gram-negative sepsis: Principal | ICD-10-CM | POA: Diagnosis present

## 2018-11-07 DIAGNOSIS — E118 Type 2 diabetes mellitus with unspecified complications: Secondary | ICD-10-CM | POA: Diagnosis not present

## 2018-11-07 DIAGNOSIS — E11621 Type 2 diabetes mellitus with foot ulcer: Secondary | ICD-10-CM | POA: Diagnosis present

## 2018-11-07 DIAGNOSIS — E291 Testicular hypofunction: Secondary | ICD-10-CM | POA: Diagnosis present

## 2018-11-07 DIAGNOSIS — Z888 Allergy status to other drugs, medicaments and biological substances status: Secondary | ICD-10-CM

## 2018-11-07 DIAGNOSIS — R778 Other specified abnormalities of plasma proteins: Secondary | ICD-10-CM | POA: Diagnosis not present

## 2018-11-07 DIAGNOSIS — D696 Thrombocytopenia, unspecified: Secondary | ICD-10-CM

## 2018-11-07 DIAGNOSIS — R6520 Severe sepsis without septic shock: Secondary | ICD-10-CM | POA: Diagnosis present

## 2018-11-07 DIAGNOSIS — N400 Enlarged prostate without lower urinary tract symptoms: Secondary | ICD-10-CM | POA: Diagnosis present

## 2018-11-07 DIAGNOSIS — E669 Obesity, unspecified: Secondary | ICD-10-CM | POA: Diagnosis present

## 2018-11-07 DIAGNOSIS — Z20828 Contact with and (suspected) exposure to other viral communicable diseases: Secondary | ICD-10-CM | POA: Diagnosis present

## 2018-11-07 DIAGNOSIS — N179 Acute kidney failure, unspecified: Secondary | ICD-10-CM | POA: Diagnosis not present

## 2018-11-07 DIAGNOSIS — E860 Dehydration: Secondary | ICD-10-CM | POA: Diagnosis present

## 2018-11-07 DIAGNOSIS — R7989 Other specified abnormal findings of blood chemistry: Secondary | ICD-10-CM

## 2018-11-07 DIAGNOSIS — G9340 Encephalopathy, unspecified: Secondary | ICD-10-CM | POA: Diagnosis not present

## 2018-11-07 DIAGNOSIS — I878 Other specified disorders of veins: Secondary | ICD-10-CM | POA: Diagnosis present

## 2018-11-07 DIAGNOSIS — E039 Hypothyroidism, unspecified: Secondary | ICD-10-CM | POA: Diagnosis present

## 2018-11-07 DIAGNOSIS — Z85038 Personal history of other malignant neoplasm of large intestine: Secondary | ICD-10-CM

## 2018-11-07 DIAGNOSIS — G4733 Obstructive sleep apnea (adult) (pediatric): Secondary | ICD-10-CM | POA: Diagnosis present

## 2018-11-07 DIAGNOSIS — Z8042 Family history of malignant neoplasm of prostate: Secondary | ICD-10-CM

## 2018-11-07 DIAGNOSIS — G4730 Sleep apnea, unspecified: Secondary | ICD-10-CM | POA: Diagnosis not present

## 2018-11-07 DIAGNOSIS — L02612 Cutaneous abscess of left foot: Secondary | ICD-10-CM | POA: Diagnosis present

## 2018-11-07 DIAGNOSIS — G459 Transient cerebral ischemic attack, unspecified: Secondary | ICD-10-CM

## 2018-11-07 DIAGNOSIS — E1140 Type 2 diabetes mellitus with diabetic neuropathy, unspecified: Secondary | ICD-10-CM | POA: Diagnosis present

## 2018-11-07 DIAGNOSIS — Z6836 Body mass index (BMI) 36.0-36.9, adult: Secondary | ICD-10-CM

## 2018-11-07 DIAGNOSIS — K219 Gastro-esophageal reflux disease without esophagitis: Secondary | ICD-10-CM | POA: Diagnosis present

## 2018-11-07 DIAGNOSIS — Z800 Family history of malignant neoplasm of digestive organs: Secondary | ICD-10-CM

## 2018-11-07 DIAGNOSIS — Z808 Family history of malignant neoplasm of other organs or systems: Secondary | ICD-10-CM

## 2018-11-07 DIAGNOSIS — Z7984 Long term (current) use of oral hypoglycemic drugs: Secondary | ICD-10-CM

## 2018-11-07 DIAGNOSIS — I129 Hypertensive chronic kidney disease with stage 1 through stage 4 chronic kidney disease, or unspecified chronic kidney disease: Secondary | ICD-10-CM | POA: Diagnosis present

## 2018-11-07 DIAGNOSIS — Z79899 Other long term (current) drug therapy: Secondary | ICD-10-CM

## 2018-11-07 DIAGNOSIS — Z803 Family history of malignant neoplasm of breast: Secondary | ICD-10-CM

## 2018-11-07 DIAGNOSIS — G039 Meningitis, unspecified: Secondary | ICD-10-CM | POA: Diagnosis present

## 2018-11-07 DIAGNOSIS — R29818 Other symptoms and signs involving the nervous system: Secondary | ICD-10-CM | POA: Diagnosis present

## 2018-11-07 DIAGNOSIS — F329 Major depressive disorder, single episode, unspecified: Secondary | ICD-10-CM | POA: Diagnosis present

## 2018-11-07 DIAGNOSIS — R7881 Bacteremia: Secondary | ICD-10-CM | POA: Diagnosis not present

## 2018-11-07 LAB — CSF CELL COUNT WITH DIFFERENTIAL
Eosinophils, CSF: 0 %
Eosinophils, CSF: 0 %
Lymphs, CSF: 73 %
Lymphs, CSF: 79 %
Monocyte-Macrophage-Spinal Fluid: 18 %
Monocyte-Macrophage-Spinal Fluid: 22 %
RBC Count, CSF: 113 /mm3 — ABNORMAL HIGH (ref 0–3)
RBC Count, CSF: 247 /mm3 — ABNORMAL HIGH (ref 0–3)
Segmented Neutrophils-CSF: 3 %
Segmented Neutrophils-CSF: 5 %
Tube #: 1
Tube #: 1
WBC, CSF: 4 /mm3 (ref 0–5)
WBC, CSF: 7 /mm3 — ABNORMAL HIGH (ref 0–5)

## 2018-11-07 LAB — PROTIME-INR
INR: 1.2 (ref 0.8–1.2)
Prothrombin Time: 15.3 seconds — ABNORMAL HIGH (ref 11.4–15.2)

## 2018-11-07 LAB — COMPREHENSIVE METABOLIC PANEL
ALT: 27 U/L (ref 0–44)
AST: 44 U/L — ABNORMAL HIGH (ref 15–41)
Albumin: 4.1 g/dL (ref 3.5–5.0)
Alkaline Phosphatase: 61 U/L (ref 38–126)
Anion gap: 19 — ABNORMAL HIGH (ref 5–15)
BUN: 25 mg/dL — ABNORMAL HIGH (ref 8–23)
CO2: 17 mmol/L — ABNORMAL LOW (ref 22–32)
Calcium: 8.7 mg/dL — ABNORMAL LOW (ref 8.9–10.3)
Chloride: 95 mmol/L — ABNORMAL LOW (ref 98–111)
Creatinine, Ser: 1.82 mg/dL — ABNORMAL HIGH (ref 0.61–1.24)
GFR calc Af Amer: 45 mL/min — ABNORMAL LOW (ref >60)
GFR calc non Af Amer: 39 mL/min — ABNORMAL LOW (ref >60)
Glucose, Bld: 206 mg/dL — ABNORMAL HIGH (ref 70–99)
Potassium: 3.9 mmol/L (ref 3.5–5.1)
Sodium: 131 mmol/L — ABNORMAL LOW (ref 135–145)
Total Bilirubin: 1.3 mg/dL — ABNORMAL HIGH (ref 0.3–1.2)
Total Protein: 8.1 g/dL (ref 6.5–8.1)

## 2018-11-07 LAB — URINALYSIS, COMPLETE (UACMP) WITH MICROSCOPIC
Bacteria, UA: NONE SEEN
Bilirubin Urine: NEGATIVE
Glucose, UA: >=500 mg/dL — AB
Ketones, ur: NEGATIVE mg/dL
Leukocytes,Ua: NEGATIVE
Nitrite: NEGATIVE
Protein, ur: 100 mg/dL — AB
RBC / HPF: >50 RBC/hpf — ABNORMAL HIGH (ref 0–5)
Specific Gravity, Urine: 1.015 (ref 1.005–1.030)
pH: 5 (ref 5.0–8.0)

## 2018-11-07 LAB — T4, FREE: Free T4: 0.75 ng/dL (ref 0.61–1.12)

## 2018-11-07 LAB — GLUCOSE, CSF: Glucose, CSF: 103 mg/dL — ABNORMAL HIGH (ref 40–70)

## 2018-11-07 LAB — CBC
HCT: 43 % (ref 39.0–52.0)
Hemoglobin: 14.2 g/dL (ref 13.0–17.0)
MCH: 27.2 pg (ref 26.0–34.0)
MCHC: 33 g/dL (ref 30.0–36.0)
MCV: 82.4 fL (ref 80.0–100.0)
Platelets: 117 10*3/uL — ABNORMAL LOW (ref 150–400)
RBC: 5.22 MIL/uL (ref 4.22–5.81)
RDW: 14.1 % (ref 11.5–15.5)
WBC: 10.7 10*3/uL — ABNORMAL HIGH (ref 4.0–10.5)
nRBC: 0 % (ref 0.0–0.2)

## 2018-11-07 LAB — PROTEIN, CSF: Total  Protein, CSF: 33 mg/dL (ref 15–45)

## 2018-11-07 LAB — LACTIC ACID, PLASMA
Lactic Acid, Venous: 8.1 mmol/L (ref 0.5–1.9)
Lactic Acid, Venous: 2.3 mmol/L (ref 0.5–1.9)

## 2018-11-07 LAB — DIFFERENTIAL
Abs Immature Granulocytes: 0.09 10*3/uL — ABNORMAL HIGH (ref 0.00–0.07)
Basophils Absolute: 0.1 10*3/uL (ref 0.0–0.1)
Basophils Relative: 1 %
Eosinophils Absolute: 0.1 10*3/uL (ref 0.0–0.5)
Eosinophils Relative: 1 %
Immature Granulocytes: 1 %
Lymphocytes Relative: 10 %
Lymphs Abs: 1 10*3/uL (ref 0.7–4.0)
Monocytes Absolute: 0.5 10*3/uL (ref 0.1–1.0)
Monocytes Relative: 5 %
Neutro Abs: 8.9 10*3/uL — ABNORMAL HIGH (ref 1.7–7.7)
Neutrophils Relative %: 82 %

## 2018-11-07 LAB — APTT: aPTT: 35 seconds (ref 24–36)

## 2018-11-07 LAB — PROCALCITONIN: Procalcitonin: 16.93 ng/mL

## 2018-11-07 LAB — TSH: TSH: 1.447 u[IU]/mL (ref 0.350–4.500)

## 2018-11-07 LAB — GLUCOSE, CAPILLARY
Glucose-Capillary: 214 mg/dL — ABNORMAL HIGH (ref 70–99)
Glucose-Capillary: 226 mg/dL — ABNORMAL HIGH (ref 70–99)
Glucose-Capillary: 239 mg/dL — ABNORMAL HIGH (ref 70–99)

## 2018-11-07 LAB — SARS CORONAVIRUS 2 BY RT PCR (HOSPITAL ORDER, PERFORMED IN ~~LOC~~ HOSPITAL LAB): SARS Coronavirus 2: NEGATIVE

## 2018-11-07 MED ORDER — LORATADINE 10 MG PO TABS: 10.0000 mg | ORAL_TABLET | Freq: Every day | ORAL | Status: DC | PRN

## 2018-11-07 MED ORDER — ASPIRIN 325 MG PO TABS
325.0000 mg | ORAL_TABLET | Freq: Every day | ORAL | Status: DC
  Administered 2018-11-08 – 2018-11-10 (×3): 325 mg via ORAL
  Filled 2018-11-07 (×3): qty 1

## 2018-11-07 MED ORDER — VANCOMYCIN HCL 10 G IV SOLR
1250.0000 mg | INTRAVENOUS | Status: DC
  Filled 2018-11-07: qty 1250

## 2018-11-07 MED ORDER — VANCOMYCIN HCL IN DEXTROSE 1-5 GM/200ML-% IV SOLN
1000.0000 mg | Freq: Once | INTRAVENOUS | Status: AC
  Administered 2018-11-07: 1000 mg via INTRAVENOUS
  Filled 2018-11-07: qty 200

## 2018-11-07 MED ORDER — SERTRALINE HCL 50 MG PO TABS
50.0000 mg | ORAL_TABLET | Freq: Every evening | ORAL | Status: DC
  Administered 2018-11-07 – 2018-11-09 (×3): 50 mg via ORAL
  Filled 2018-11-07 (×3): qty 1

## 2018-11-07 MED ORDER — VITAMIN D3 25 MCG (1000 UNIT) PO TABS
2000.0000 [IU] | ORAL_TABLET | Freq: Every day | ORAL | Status: DC
  Administered 2018-11-08 – 2018-11-10 (×3): 2000 [IU] via ORAL
  Filled 2018-11-07 (×6): qty 2

## 2018-11-07 MED ORDER — FINASTERIDE 5 MG PO TABS
5.0000 mg | ORAL_TABLET | Freq: Every evening | ORAL | Status: DC
  Administered 2018-11-07 – 2018-11-09 (×3): 5 mg via ORAL
  Filled 2018-11-07 (×3): qty 1

## 2018-11-07 MED ORDER — INSULIN ASPART 100 UNIT/ML ~~LOC~~ SOLN
0.0000 [IU] | Freq: Three times a day (TID) | SUBCUTANEOUS | Status: DC
  Administered 2018-11-07: 18:00:00 3 [IU] via SUBCUTANEOUS
  Administered 2018-11-08 – 2018-11-10 (×3): 1 [IU] via SUBCUTANEOUS
  Filled 2018-11-07 (×4): qty 1

## 2018-11-07 MED ORDER — FENTANYL CITRATE (PF) 100 MCG/2ML IJ SOLN
25.0000 ug | Freq: Once | INTRAMUSCULAR | Status: AC
  Administered 2018-11-07: 25 ug via INTRAVENOUS

## 2018-11-07 MED ORDER — VITAMIN B-12 1000 MCG PO TABS
500.0000 ug | ORAL_TABLET | Freq: Every day | ORAL | Status: DC
  Administered 2018-11-08 – 2018-11-10 (×3): 500 ug via ORAL
  Filled 2018-11-07 (×3): qty 1

## 2018-11-07 MED ORDER — VANCOMYCIN HCL 1.25 G IV SOLR
1250.0000 mg | Freq: Once | INTRAVENOUS | Status: AC
  Administered 2018-11-07: 1250 mg via INTRAVENOUS
  Filled 2018-11-07: qty 1250

## 2018-11-07 MED ORDER — CO-ENZYME Q-10 50 MG PO CAPS: 50.0000 mg | ORAL_CAPSULE | Freq: Every day | ORAL | Status: DC

## 2018-11-07 MED ORDER — SODIUM CHLORIDE 0.9 % IV SOLN
INTRAVENOUS | Status: DC
  Administered 2018-11-07: 18:00:00 via INTRAVENOUS

## 2018-11-07 MED ORDER — DEXAMETHASONE SODIUM PHOSPHATE 10 MG/ML IJ SOLN
10.0000 mg | Freq: Once | INTRAMUSCULAR | Status: AC
  Administered 2018-11-07: 10 mg via INTRAVENOUS
  Filled 2018-11-07: qty 1

## 2018-11-07 MED ORDER — SODIUM CHLORIDE 0.9% FLUSH: 3.0000 mL | Freq: Once | INTRAVENOUS | Status: DC

## 2018-11-07 MED ORDER — ASPIRIN 81 MG PO TABS: 81.0000 mg | ORAL_TABLET | Freq: Every day | ORAL | Status: DC

## 2018-11-07 MED ORDER — ENOXAPARIN SODIUM 40 MG/0.4ML ~~LOC~~ SOLN
40.0000 mg | SUBCUTANEOUS | Status: DC
  Administered 2018-11-08: 09:00:00 40 mg via SUBCUTANEOUS
  Filled 2018-11-07: qty 0.4

## 2018-11-07 MED ORDER — INSULIN ASPART 100 UNIT/ML ~~LOC~~ SOLN
0.0000 [IU] | Freq: Every day | SUBCUTANEOUS | Status: DC
  Administered 2018-11-07: 22:00:00 2 [IU] via SUBCUTANEOUS
  Filled 2018-11-07: qty 1

## 2018-11-07 MED ORDER — SODIUM CHLORIDE 0.9 % IV SOLN
2.0000 g | INTRAVENOUS | Status: DC
  Administered 2018-11-08: 2 g via INTRAVENOUS
  Filled 2018-11-07: qty 20
  Filled 2018-11-07: qty 2

## 2018-11-07 MED ORDER — SODIUM CHLORIDE 0.9 % IV BOLUS
1000.0000 mL | Freq: Once | INTRAVENOUS | Status: AC
  Administered 2018-11-07: 1000 mL via INTRAVENOUS

## 2018-11-07 MED ORDER — KRILL OIL 1000 MG PO CAPS: 3000.0000 mg | ORAL_CAPSULE | Freq: Every evening | ORAL | Status: DC

## 2018-11-07 MED ORDER — LIOTHYRONINE SODIUM 25 MCG PO TABS
12.5000 ug | ORAL_TABLET | Freq: Every day | ORAL | Status: DC
  Administered 2018-11-08 – 2018-11-10 (×3): 12.5 ug via ORAL
  Filled 2018-11-07 (×3): qty 1

## 2018-11-07 MED ORDER — LEVOTHYROXINE SODIUM 125 MCG PO TABS
125.0000 ug | ORAL_TABLET | Freq: Every day | ORAL | Status: DC
  Administered 2018-11-08 – 2018-11-10 (×3): 125 ug via ORAL
  Filled 2018-11-07 (×4): qty 1

## 2018-11-07 MED ORDER — PANTOPRAZOLE SODIUM 40 MG PO TBEC
40.0000 mg | DELAYED_RELEASE_TABLET | Freq: Every day | ORAL | Status: DC
  Administered 2018-11-07 – 2018-11-10 (×4): 40 mg via ORAL
  Filled 2018-11-07 (×4): qty 1

## 2018-11-07 MED ORDER — ADULT MULTIVITAMIN W/MINERALS CH
1.0000 | ORAL_TABLET | Freq: Every day | ORAL | Status: DC
  Administered 2018-11-08 – 2018-11-10 (×3): 1 via ORAL
  Filled 2018-11-07 (×3): qty 1

## 2018-11-07 MED ORDER — FENTANYL CITRATE (PF) 100 MCG/2ML IJ SOLN
100.0000 ug | Freq: Once | INTRAMUSCULAR | Status: AC
  Administered 2018-11-07: 25 ug via INTRAVENOUS
  Filled 2018-11-07: qty 2

## 2018-11-07 MED ORDER — SODIUM CHLORIDE 0.9 % IV SOLN
2.0000 g | Freq: Two times a day (BID) | INTRAVENOUS | Status: DC
  Filled 2018-11-07 (×2): qty 20

## 2018-11-07 MED ORDER — ONDANSETRON HCL 4 MG/2ML IJ SOLN
4.0000 mg | Freq: Once | INTRAMUSCULAR | Status: AC
  Administered 2018-11-07: 4 mg via INTRAVENOUS
  Filled 2018-11-07: qty 2

## 2018-11-07 MED ORDER — SODIUM CHLORIDE 0.9 % IV SOLN
2.0000 g | Freq: Once | INTRAVENOUS | Status: AC
  Administered 2018-11-07: 2 g via INTRAVENOUS
  Filled 2018-11-07: qty 20

## 2018-11-07 MED ORDER — STROKE: EARLY STAGES OF RECOVERY BOOK
Freq: Once | Status: AC
  Administered 2018-11-07: 23:00:00

## 2018-11-07 MED ORDER — FLUTICASONE PROPIONATE 50 MCG/ACT NA SUSP
1.0000 | Freq: Every day | NASAL | Status: DC | PRN
  Filled 2018-11-07: qty 16

## 2018-11-07 NOTE — Consult Note (Signed)
NAME: Jacob Lawson  DOB: 1955-06-23  MRN: [id number]  Date/Time: 11/07/2018 5:32 PM  REQUESTING PROVIDER: Dr. Enid Baas Subjective:  REASON FOR CONSULT: Possible meningitis ? Jacob Lawson is a 63 y.o. male with a history of hypertension, hypothyroidism, sleep apnea, hyperlipidemia presents to the ED with sudden onset weakness on the left side that started around 10 AM today.  He also had some difficulty with speaking.  As per patient and his wife he felt weak and bad on Tuesday. HE went to work on Wednesday and felt bad and started having fever.Thursday he stayed off work. Today he was at the Urologist office ( has low testosterone and Mild BPH) and started having chills and fever measured there was 101. He came to his car and wife noted that he was not speaking and sliding to his left and she drove him to the ED. On arriving in the ED after some time his speech was completely okay and his weakness had resolved.  He had been seen by neurologist and TPA was not needed.  He had CT of the head which did not reveal any acute findings.  MRI and MRA is pending.  Covid test Negative.  I am asked to see the patient as there was a concern for meningitis and patient had undergone a lumbar puncture. Pt has some stinging when he passed urine and had a rt flank ache Pt says he has had some memory issues for the past 2 weeks and has not been himself He has occasional cough due to the OSA machine but nothing significant. No significant headache but some times when he coughed his head hurt No Runny nose or sore throat. Wears mask all the time- Owns an event organizing business.  No sick contacts No travel No tick bites but summer had a wandering tick he removed from his body. He also sustained an injury to his left foot 2 months ago ( not sure how) but thinks he could have stepped on something sharp and noted blood. He took a needle heated it and opened the spot on his foot- The foot was painful and red then but not  anymore. He was supposed to see the Podiatrist today ( Dr.BAker)  No tooth ache No recent antibiotics or steroids No recent surgery Got flu vaccine 3 weeks ago  Past Medical History:  Diagnosis Date  . Carpal tunnel syndrome on both sides   . Depression   . Diabetes mellitus without complication (HCC)   . GERD (gastroesophageal reflux disease)   . Hyperlipidemia   . Hypertension   . Hypothyroidism   . Sleep apnea    CPAP  . Wears hearing aid in both ears     Past Surgical History:  Procedure Laterality Date  . APPENDECTOMY    . CARPAL TUNNEL RELEASE Right 11/2003  . CARPAL TUNNEL RELEASE Left 11/19/2017   Procedure: LEFT OPEN CARPAL TUNNEL RELEASE;  Surgeon: Erin Sons, MD;  Location: [facility];  Service: Orthopedics;  Laterality: Left;  Diabetic - oral meds Sleep Apnea  . COLONOSCOPY     2/91, 6/00, 01/04, 7/08, 10/10, 12/13, 02/26/17  . ESOPHAGOGASTRODUODENOSCOPY     2/91, 1/95, 6/00, 1/04, 7/08, 10/10  . EXCISION NEUROMA Left 06/2007   foot  . FINGER SURGERY     for congenital webbing  . MANDIBLE SURGERY     in late 30s, fo overbite  . PROSTATE BIOPSY  12/2015  . ROTATOR CUFF REPAIR Left 02/2005  . THYROIDECTOMY, PARTIAL    .  TONSILLECTOMY     and adenoidectomy    SH Lives with wife Non smoker Occasional beer No drug use  Family history Prostate cancer brother Pancreatic cancer with father Oral cancer maternal grandfather Dementia mother Brain cancer paternal aunt Ovarian cancer sister. Breast cancer sister  Allergies  Allergen Reactions  . Lamictal [Lamotrigine] Hives  . Statins     Muscle/joint aches   I ? Current Facility-Administered Medications  Medication Dose Route Frequency Provider Last Rate Last Dose  . 0.9 %  sodium chloride infusion   Intravenous Continuous Ojie, Jude, MD      . Melene Muller ON 11/08/2018] aspirin tablet 325 mg  325 mg Oral Daily Ojie, Jude, MD      . cefTRIAXone (ROCEPHIN) 2 g in sodium chloride 0.9 % 100 mL  IVPB  2 g Intravenous Q12H Ojie, Jude, MD      . Centrum Silver   Oral Daily Ojie, Jude, MD      . co-enzyme Q-10 capsule 50 mg  50 mg Oral Daily Ojie, Jude, MD      . Melene Muller ON 11/08/2018] enoxaparin (LOVENOX) injection 40 mg  40 mg Subcutaneous Q24H Ojie, Jude, MD      . finasteride (PROSCAR) tablet 5 mg  5 mg Oral QPM Ojie, Jude, MD      . fluticasone (FLONASE) 50 MCG/ACT nasal spray 1-2 spray  1-2 spray Each Nare Daily PRN Ojie, Jude, MD      . insulin aspart (novoLOG) injection 0-5 Units  0-5 Units Subcutaneous QHS Ojie, Jude, MD      . insulin aspart (novoLOG) injection 0-9 Units  0-9 Units Subcutaneous TID WC Ojie, Jude, MD      . Boris Lown Oil CAPS 3,000 mg  3,000 mg Oral QPM Ojie, Jude, MD      . Melene Muller ON 11/08/2018] levothyroxine (SYNTHROID) tablet 125 mcg  125 mcg Oral QAC breakfast Ojie, Jude, MD      . liothyronine (CYTOMEL) tablet 12.5 mcg  12.5 mcg Oral Daily Ojie, Jude, MD      . loratadine (CLARITIN) tablet 10 mg  10 mg Oral Daily PRN Ojie, Jude, MD      . pantoprazole (PROTONIX) EC tablet 40 mg  40 mg Oral Daily Ojie, Jude, MD      . sertraline (ZOLOFT) tablet 50 mg  50 mg Oral QPM Ojie, Jude, MD      . sodium chloride flush (NS) 0.9 % injection 3 mL  3 mL Intravenous Once Concha Se, MD      . Melene Muller ON 11/08/2018] vancomycin (VANCOCIN) 1,250 mg in sodium chloride 0.9 % 250 mL IVPB  1,250 mg Intravenous Q24H Shanlever, Charmayne Sheer, RPH      . vitamin B-12 (CYANOCOBALAMIN) tablet 500 mcg  500 mcg Oral Daily Ojie, Jude, MD      . Vitamin D3 TABS 2,000 Units  2,000 Units Oral Daily Ojie, Jude, MD       Current Outpatient Medications  Medication Sig Dispense Refill  . aspirin 81 MG tablet Take 81 mg by mouth daily.    . Cholecalciferol (VITAMIN D3) 2000 units TABS Take 2,000 Units by mouth daily.     Marland Kitchen co-enzyme Q-10 50 MG capsule Take 50 mg by mouth daily.    Marland Kitchen esomeprazole (NEXIUM) 20 MG capsule Take 20 mg by mouth daily before breakfast.     . finasteride (PROSCAR) 5 MG  tablet Take 5 mg by mouth every evening.     . fluticasone (FLONASE) 50  MCG/ACT nasal spray Place 1-2 sprays into both nostrils daily as needed for allergies.     Boris Lown Oil 1000 MG CAPS Take 3,000 mg by mouth every evening.     Marland Kitchen levothyroxine (SYNTHROID, LEVOTHROID) 125 MCG tablet Take 125 mcg by mouth daily before breakfast.    . liothyronine (CYTOMEL) 25 MCG tablet Take 12.5 mcg by mouth daily.     Marland Kitchen lisinopril (ZESTRIL) 40 MG tablet Take 40 mg by mouth daily.     Marland Kitchen loratadine (CLARITIN) 10 MG tablet Take 10 mg by mouth daily as needed for allergies.    . metFORMIN (GLUCOPHAGE-XR) 500 MG 24 hr tablet Take 1,000 mg by mouth daily with breakfast.    . metoprolol succinate (TOPROL-XL) 50 MG 24 hr tablet Take 50 mg by mouth daily. Take with or immediately following a meal.    . Multiple Vitamins-Minerals (CENTRUM SILVER PO) Take 1 tablet by mouth daily.     . sertraline (ZOLOFT) 50 MG tablet Take 50 mg by mouth every evening.     . TESTOSTERONE TD Place 1.25 g onto the skin daily. (20% topical cream)    . vitamin B-12 (CYANOCOBALAMIN) 500 MCG tablet Take 500 mcg by mouth daily.       Abtx:  Anti-infectives (From admission, onward)   Start     Dose/Rate Route Frequency Ordered Stop   11/08/18 1000  vancomycin (VANCOCIN) 1,250 mg in sodium chloride 0.9 % 250 mL IVPB     1,250 mg 166.7 mL/hr over 90 Minutes Intravenous Every 24 hours 11/07/18 1503     11/07/18 1445  cefTRIAXone (ROCEPHIN) 2 g in sodium chloride 0.9 % 100 mL IVPB     2 g 200 mL/hr over 30 Minutes Intravenous Every 12 hours 11/07/18 1439     11/07/18 1215  vancomycin (VANCOCIN) 1,250 mg in sodium chloride 0.9 % 250 mL IVPB     1,250 mg 166.7 mL/hr over 90 Minutes Intravenous  Once 11/07/18 1202 11/07/18 1606   11/07/18 1115  cefTRIAXone (ROCEPHIN) 2 g in sodium chloride 0.9 % 100 mL IVPB     2 g 200 mL/hr over 30 Minutes Intravenous  Once 11/07/18 1102 11/07/18 1205   11/07/18 1115  vancomycin (VANCOCIN) IVPB 1000 mg/200 mL  premix     1,000 mg 200 mL/hr over 60 Minutes Intravenous  Once 11/07/18 1102 11/07/18 1314      REVIEW OF SYSTEMS:  Const:  fever,  chills, intentional weight loss Eyes: negative diplopia or visual changes, negative eye pain ENT: negative coryza, negative sore throat Resp: negative cough, hemoptysis, dyspnea Cards: negative for chest pain, palpitations, lower extremity edema GU: negative for frequency, has dysuria , no  hematuria GI: Negative for abdominal pain, diarrhea, bleeding, constipation Skin: negative for rash and pruritus Heme: negative for easy bruising and gum/nose bleeding MS: negative for myalgias, arthralgias, back pain and muscle weakness Neurolo:as above Psych:  anxiety,  Endocrine: has hypothyroidism and DM Allergy/Immunology-as above Objective:  VITALS:  BP 96/65   Pulse 76   Temp 98.8 F (37.1 C) (Oral)   Resp 16   Ht 6' (1.829 m)   Wt 120.7 kg   SpO2 98%   BMI 36.09 kg/m  PHYSICAL EXAM:  General: Alert, cooperative, no distress, appears stated age. obese Head: Normocephalic, without obvious abnormality, atraumatic. Eyes: Conjunctivae clear, anicteric sclerae. Pupils are equal, full range of eye movts ENT Nares normal. No drainage or sinus tenderness. Lips, mucosa, and tongue normal. No Thrush Neck: Supple, symmetrical,  no adenopathy, thyroid: non tender no carotid bruit and no JVD. Back: No CVA tenderness. Lungs: Clear to auscultation bilaterally. No Wheezing or Rhonchi. No rales. Heart: Regular rate and rhythm, no murmur, rub or gallop. Abdomen: Soft, non-tender,not distended. Bowel sounds normal. No masses Extremities:left foot- at the site of the 1st MTP on the plantar aspect there is a pin hole Tenderness on pressing it No erythema or discharge    Skin: No rashes or lesions. Or bruising Lymph: Cervical, supraclavicular normal. Neurologic: Grossly non-focal, no focal weakness Pertinent Labs Lab Results CBC    Component Value Date/Time    WBC 10.7 (H) 11/07/2018 1005   RBC 5.22 11/07/2018 1005   HGB 14.2 11/07/2018 1005   HGB 14.4 02/24/2014 1742   HCT 43.0 11/07/2018 1005   HCT 44.4 02/24/2014 1742   PLT 117 (L) 11/07/2018 1005   PLT 185 02/24/2014 1742   MCV 82.4 11/07/2018 1005   MCV 83 02/24/2014 1742   MCH 27.2 11/07/2018 1005   MCHC 33.0 11/07/2018 1005   RDW 14.1 11/07/2018 1005   RDW 14.5 02/24/2014 1742   LYMPHSABS 1.0 11/07/2018 1005   MONOABS 0.5 11/07/2018 1005   EOSABS 0.1 11/07/2018 1005   BASOSABS 0.1 11/07/2018 1005    CMP Latest Ref Rng & Units 11/07/2018 11/14/2017 02/24/2014  Glucose 70 - 99 mg/dL 161(W206(H) - 93  BUN 8 - 23 mg/dL 96(E25(H) - 13  Creatinine 0.61 - 1.24 mg/dL 4.54(U1.82(H) 9.811.10 1.911.22  Sodium 135 - 145 mmol/L 131(L) - 138  Potassium 3.5 - 5.1 mmol/L 3.9 - 3.8  Chloride 98 - 111 mmol/L 95(L) - 105  CO2 22 - 32 mmol/L 17(L) - 29  Calcium 8.9 - 10.3 mg/dL 4.7(W8.7(L) - 9.0  Total Protein 6.5 - 8.1 g/dL 8.1 - 7.8  Total Bilirubin 0.3 - 1.2 mg/dL 2.9(F1.3(H) - 0.3  Alkaline Phos 38 - 126 U/L 61 - 79  AST 15 - 41 U/L 44(H) - 24  ALT 0 - 44 U/L 27 - 26      Microbiology: Recent Results (from the past 240 hour(s))  SARS Coronavirus 2 by RT PCR (hospital order, [facility]) Nasopharyngeal Nasopharyngeal Swab     Status: None   Collection Time: 11/07/18 10:40 AM   Specimen: Nasopharyngeal Swab  Result Value Ref Range Status   SARS Coronavirus 2 NEGATIVE NEGATIVE Final    Comment: (NOTE) If result is NEGATIVE SARS-CoV-2 target nucleic acids are NOT DETECTED. The SARS-CoV-2 RNA is generally detectable in upper and lower  respiratory specimens during the acute phase of infection. The lowest  concentration of SARS-CoV-2 viral copies this assay can detect is 250  copies / mL. A negative result does not preclude SARS-CoV-2 infection  and should not be used as the sole basis for treatment or other  patient management decisions.  A negative result may occur with  improper specimen  collection / handling, submission of specimen other  than nasopharyngeal swab, presence of viral mutation(s) within the  areas targeted by this assay, and inadequate number of viral copies  (<250 copies / mL). A negative result must be combined with clinical  observations, patient history, and epidemiological information. If result is POSITIVE SARS-CoV-2 target nucleic acids are DETECTED. The SARS-CoV-2 RNA is generally detectable in upper and lower  respiratory specimens dur ing the acute phase of infection.  Positive  results are indicative of active infection with SARS-CoV-2.  Clinical  correlation with patient history and other diagnostic information is  necessary to determine patient infection status.  Positive results do  not rule out bacterial infection or co-infection with other viruses. If result is PRESUMPTIVE POSTIVE SARS-CoV-2 nucleic acids MAY BE PRESENT.   A presumptive positive result was obtained on the submitted specimen  and confirmed on repeat testing.  While 2019 novel coronavirus  (SARS-CoV-2) nucleic acids may be present in the submitted sample  additional confirmatory testing may be necessary for epidemiological  and / or clinical management purposes  to differentiate between  SARS-CoV-2 and other Sarbecovirus currently known to infect humans.  If clinically indicated additional testing with an alternate test  methodology [phone]) is advised. The SARS-CoV-2 RNA is generally  detectable in upper and lower respiratory sp ecimens during the acute  phase of infection. The expected result is Negative. Fact Sheet for Patients:  StrictlyIdeas.no Fact Sheet for Healthcare Providers: BankingDealers.co.za This test is not yet approved or cleared by the [place] FDA and has been authorized for detection and/or diagnosis of SARS-CoV-2 by FDA under an Emergency Use Authorization (EUA).  This EUA will remain in effect  (meaning this test can be used) for the duration of the COVID-19 declaration under Section 564(b)(1) of the Act, 21 U.S.C. section 360bbb-3(b)(1), unless the authorization is terminated or revoked sooner. Performed at [facility], [address] [place], [place] [address]   CSF culture     Status: None (Preliminary result)   Collection Time: 11/07/18 12:50 PM   Specimen: CSF; Cerebrospinal Fluid  Result Value Ref Range Status   Specimen Description CSF  Final   Special Requests NONE  Final   Gram Stain   Final    WBC SEEN RED BLOOD CELLS NO ORGANISMS SEEN Performed at [facility], [address] [place], [place] [address]    Culture PENDING  Incomplete   Report Status PENDING  Incomplete   UA shows 6-10 WBCs, protein 100, glucose more than 500, RBC of more than 50 CSF examination in tube 1 is WBC of 7 and RBC of 247 and 2 4 WBC of 4 and RBC of 113 Total protein 33 Glucose CSF 103  SARS-CoV-2 negative  IMAGING RESULTS CT of the abdomen and pelvis reveals prominent left inguinal lymph node with a short axis diameter of 1.4 cm. Exophytic cyst on the left renal Lower pole Fatty liver.  Chest x-ray: Clear lungs EKG sinus tachycardia.   I have personally reviewed the films ? Impression/Recommendation ? ?Fever with chills : Urosepsis ? Rt pyelonephritis VS left foot infection  No evidence of meningitis clinically and by LP DC droplet/contact precaution SARS COV 2 Negative Will get 2 d echo Recommend  podiatry consult Continue vanco and ceftriaxone ( regular dose)- awiat blood culture  Hyperlactatemia- ? Sepsis - improved with IV fluids  AKI - combination of sepsis, pre renal ,  Encephalopathy, slurred speech and left side weakness- resolved completely- ? TIA With fever need to r/o a cardiac source MRI brain planned  Thrombocytopenia- due to sepsis- observe closely   DM- poorly controlled was on metformin- getting insulin    Hypothyroidism - on levothyroxine  ? ___________________________________________________ Discussed with patient,and wife and his nurse ID will follow him remotely this weekend- call if needed Note:  This document was prepared using Dragon voice recognition software and may include unintentional dictation errors.

## 2018-11-07 NOTE — ED Notes (Signed)
Patient transported to X-ray 

## 2018-11-07 NOTE — H&P (Signed)
[employer] [facility] [place] [facility]   PATIENT NAME: Jacob Lawson    MR#:  [id number]  DATE OF BIRTH:  09/20/1955  DATE OF ADMISSION:  11/07/2018  PRIMARY CARE PHYSICIAN: Mickey Farber, MD   REQUESTING/REFERRING PHYSICIAN:   CHIEF COMPLAINT:   Chief Complaint  Patient presents with   Code Stroke  Fevers  HISTORY OF PRESENT ILLNESS:  Jermey Closs  is a 63 y.o. male with a known history of hypertension, borderline diabetes mellitus, hyperlipidemia, obstructive sleep apnea for which patient uses CPAP at night and hypothyroidism who had gone to urology clinic this morning due to having some dysuria and concern about possible urinary tract infection.  Patient also having fevers.  Was said to have had a temperature of 101 this morning.  While at the urology clinic patient suddenly developed left-sided weakness and some difficulty with speaking.  Concerned about some confusion as well.  Patient was transferred to the emergency room as code stroke.  CT scan of the head done with no acute findings.  Patient already seen by neurologist Dr. Loretha Brasil.  Neurological symptoms already resolved and patient reported to be back to baseline as such no TPA was administered.  Neurologist recommended infectious work-up and MRI MRA of the head and neck when possible.  Covid test came back negative.  Lumbar puncture already done by interventional radiologist prior to my evaluation.  CSF analysis still pending.  Patient given vancomycin and Rocephin due to concern for possible meningitis.  Patient noted that her CT scan renal stone in the emergency room with no acute findings.  Urinalysis ordered but patient has not yet produced any urine.  Medical service called to admit patient for further evaluation and management.   PAST MEDICAL HISTORY:   Past Medical History:  Diagnosis Date   Carpal tunnel syndrome on both sides    Depression    Diabetes mellitus without complication (HCC)    GERD  (gastroesophageal reflux disease)    Hyperlipidemia    Hypertension    Hypothyroidism    Sleep apnea    CPAP   Wears hearing aid in both ears     PAST SURGICAL HISTORY:   Past Surgical History:  Procedure Laterality Date   APPENDECTOMY     CARPAL TUNNEL RELEASE Right 11/2003   CARPAL TUNNEL RELEASE Left 11/19/2017   Procedure: LEFT OPEN CARPAL TUNNEL RELEASE;  Surgeon: Erin Sons, MD;  Location: [facility];  Service: Orthopedics;  Laterality: Left;  Diabetic - oral meds Sleep Apnea   COLONOSCOPY     2/91, 6/00, 01/04, 7/08, 10/10, 12/13, 02/26/17   ESOPHAGOGASTRODUODENOSCOPY     2/91, 1/95, 6/00, 1/04, 7/08, 10/10   EXCISION NEUROMA Left 06/2007   foot   FINGER SURGERY     for congenital webbing   MANDIBLE SURGERY     in late 30s, fo overbite   PROSTATE BIOPSY  12/2015   ROTATOR CUFF REPAIR Left 02/2005   THYROIDECTOMY, PARTIAL     TONSILLECTOMY     and adenoidectomy    SOCIAL HISTORY:   Social History   Tobacco Use   Smoking status: Never Lawson   Smokeless tobacco: Never Used  Substance Use Topics   Alcohol use: Yes    Comment: occasional beer    FAMILY HISTORY:  History reviewed. No pertinent family history.  DRUG ALLERGIES:   Allergies  Allergen Reactions   Lamictal [Lamotrigine] Hives   Statins     Muscle/joint aches    REVIEW  OF SYSTEMS:   Review of Systems  Constitutional: Positive for fever. Negative for chills.  HENT: Negative for hearing loss and tinnitus.   Eyes: Negative for blurred vision and double vision.  Respiratory: Negative for cough and shortness of breath.   Cardiovascular: Negative for chest pain and palpitations.  Gastrointestinal: Negative for heartburn, nausea and vomiting.  Genitourinary: Positive for dysuria. Negative for urgency.  Musculoskeletal: Negative for myalgias and neck pain.  Skin: Negative for itching and rash.  Neurological: Negative for dizziness and headaches.        Left-sided weakness and transient episode of confusion resolved.  Psychiatric/Behavioral: Negative for depression and hallucinations.    MEDICATIONS AT HOME:   Prior to Admission medications   Medication Sig Start Date End Date Taking? Authorizing Provider  aspirin 81 MG tablet Take 81 mg by mouth daily.   Yes [provider]  Cholecalciferol (VITAMIN D3) 2000 units TABS Take 2,000 Units by mouth daily.    Yes [provider]  co-enzyme Q-10 50 MG capsule Take 50 mg by mouth daily.   Yes [provider]  esomeprazole (NEXIUM) 20 MG capsule Take 20 mg by mouth daily before breakfast.    Yes [provider]  finasteride (PROSCAR) 5 MG tablet Take 5 mg by mouth every evening.    Yes [provider]  fluticasone (FLONASE) 50 MCG/ACT nasal spray Place 1-2 sprays into both nostrils daily as needed for allergies.    Yes [provider]  Javier Docker Oil 1000 MG CAPS Take 3,000 mg by mouth every evening.    Yes [provider]  levothyroxine (SYNTHROID, LEVOTHROID) 125 MCG tablet Take 125 mcg by mouth daily before breakfast.   Yes [provider]  liothyronine (CYTOMEL) 25 MCG tablet Take 12.5 mcg by mouth daily.    Yes [provider]  lisinopril (ZESTRIL) 40 MG tablet Take 40 mg by mouth daily.    Yes [provider]  loratadine (CLARITIN) 10 MG tablet Take 10 mg by mouth daily as needed for allergies.   Yes [provider]  metFORMIN (GLUCOPHAGE-XR) 500 MG 24 hr tablet Take 1,000 mg by mouth daily with breakfast.   Yes [provider]  metoprolol succinate (TOPROL-XL) 50 MG 24 hr tablet Take 50 mg by mouth daily. Take with or immediately following a meal.   Yes [provider]  Multiple Vitamins-Minerals (CENTRUM SILVER PO) Take 1 tablet by mouth daily.    Yes [provider]  sertraline (ZOLOFT) 50 MG tablet Take 50 mg by mouth every evening.    Yes [provider]    TESTOSTERONE TD Place 1.25 g onto the skin daily. (20% topical cream)   Yes [provider]  vitamin B-12 (CYANOCOBALAMIN) 500 MCG tablet Take 500 mcg by mouth daily.   Yes [provider]      VITAL SIGNS:  Blood pressure 99/74, pulse 81, temperature 98.8 F (37.1 C), temperature source Oral, resp. rate 17, height 6' (1.829 m), weight 120.7 kg, SpO2 95 %.  PHYSICAL EXAMINATION:  Physical Exam  GENERAL:  63 y.o.-year-old patient lying in the bed with no acute distress.  EYES: Pupils equal, round, reactive to light and accommodation. No scleral icterus. Extraocular muscles intact.  HEENT: Head atraumatic, normocephalic. Oropharynx and nasopharynx clear.  NECK:  Supple, no jugular venous distention. No thyroid enlargement, no tenderness.  LUNGS: Normal breath sounds bilaterally, no wheezing, rales,rhonchi or crepitation. No use of accessory muscles of respiration.  CARDIOVASCULAR: S1, S2 normal.  No murmurs, rubs, or gallops.  ABDOMEN: Soft, nontender, nondistended. Bowel sounds present. No organomegaly or mass.  EXTREMITIES: No pedal edema, cyanosis, or clubbing.  NEUROLOGIC: Cranial nerves II through XII are intact. Muscle strength 5/5 in all extremities. Sensation intact. Gait not checked.  PSYCHIATRIC: The patient is alert and oriented x 3.  SKIN: No obvious rash, lesion, or ulcer.   LABORATORY PANEL:   CBC Recent Labs  Lab 11/07/18 1005  WBC 10.7*  HGB 14.2  HCT 43.0  PLT 117*   ------------------------------------------------------------------------------------------------------------------  Chemistries  Recent Labs  Lab 11/07/18 1005  NA 131*  K 3.9  CL 95*  CO2 17*  GLUCOSE 206*  BUN 25*  CREATININE 1.82*  CALCIUM 8.7*  AST 44*  ALT 27  ALKPHOS 61  BILITOT 1.3*   ------------------------------------------------------------------------------------------------------------------  Cardiac Enzymes No results for input(s): TROPONINI in the  last 168 hours. ------------------------------------------------------------------------------------------------------------------  RADIOLOGY:  Ct Chest Wo Contrast  Result Date: 11/07/2018 CLINICAL DATA:  Fever of unknown origin. EXAM: CT CHEST, ABDOMEN AND PELVIS WITHOUT CONTRAST TECHNIQUE: Multidetector CT imaging of the chest, abdomen and pelvis was performed following the standard protocol without IV contrast. COMPARISON:  11/14/2017 CT abdomen and pelvis. FINDINGS: CT CHEST FINDINGS Cardiovascular: Heart is normal size. Aorta is normal caliber. Scattered aortic and coronary artery calcifications. Mediastinum/Nodes: No mediastinal, hilar, or axillary adenopathy. Trachea and esophagus are unremarkable. Thyroid unremarkable. Lungs/Pleura: Lungs are clear. No focal airspace opacities or suspicious nodules. No effusions. Musculoskeletal: Chest wall soft tissues are unremarkable. No acute bony abnormality. CT ABDOMEN PELVIS FINDINGS Hepatobiliary: Diffuse low-density throughout the liver compatible with fatty infiltration. No focal abnormality. Gallbladder unremarkable. Pancreas: No focal abnormality or ductal dilatation. Spleen: No focal abnormality.  Normal size. Adrenals/Urinary Tract: Exophytic cyst off the lower pole of the left kidney measures up to 4.5 cm, stable. Exophytic cyst anteriorly off the lower pole of the left kidney also stable. No renal or ureteral stones. No hydronephrosis. Adrenal glands and urinary bladder unremarkable. Stomach/Bowel: Few scattered colonic diverticula. Stomach, large and small bowel grossly unremarkable. Vascular/Lymphatic: Aortic atherosclerosis. Prominent left inguinal lymph node has a short axis diameter of 14 mm. No other enlarged abdominal or pelvic lymph nodes. Reproductive: No visible focal abnormality. Other: No free fluid or free air. Musculoskeletal: No acute bony abnormality. IMPRESSION: No acute cardiopulmonary disease. Aortic atherosclerosis, coronary artery  disease. Diffuse fatty infiltration of the liver. No renal or ureteral stones. No hydronephrosis. Stable left renal cysts. Prominent left inguinal lymph node with a short axis diameter of 1.4 cm. This could be followed clinically to ensure stability. Electronically Signed   By: Charlett Nose M.D.   On: 11/07/2018 10:38   Dg Chest Portable 1 View  Result Date: 11/07/2018 CLINICAL DATA:  Fever EXAM: PORTABLE CHEST 1 VIEW COMPARISON:  04/03/2006 FINDINGS: The heart size and mediastinal contours are within normal limits. Both lungs are clear. The visualized skeletal structures are unremarkable. IMPRESSION: No acute abnormality of the lungs in AP portable projection. Electronically Signed   By: Lauralyn Primes M.D.   On: 11/07/2018 10:52   Ct Renal Stone Study  Result Date: 11/07/2018 CLINICAL DATA:  Fever of unknown origin. EXAM: CT CHEST, ABDOMEN AND PELVIS WITHOUT CONTRAST TECHNIQUE: Multidetector CT imaging of the chest, abdomen and pelvis was performed following the standard protocol without IV contrast. COMPARISON:  11/14/2017 CT abdomen and pelvis. FINDINGS: CT CHEST FINDINGS Cardiovascular: Heart is normal size. Aorta is normal caliber. Scattered aortic and coronary artery calcifications. Mediastinum/Nodes: No mediastinal, hilar, or  axillary adenopathy. Trachea and esophagus are unremarkable. Thyroid unremarkable. Lungs/Pleura: Lungs are clear. No focal airspace opacities or suspicious nodules. No effusions. Musculoskeletal: Chest wall soft tissues are unremarkable. No acute bony abnormality. CT ABDOMEN PELVIS FINDINGS Hepatobiliary: Diffuse low-density throughout the liver compatible with fatty infiltration. No focal abnormality. Gallbladder unremarkable. Pancreas: No focal abnormality or ductal dilatation. Spleen: No focal abnormality.  Normal size. Adrenals/Urinary Tract: Exophytic cyst off the lower pole of the left kidney measures up to 4.5 cm, stable. Exophytic cyst anteriorly off the lower pole of  the left kidney also stable. No renal or ureteral stones. No hydronephrosis. Adrenal glands and urinary bladder unremarkable. Stomach/Bowel: Few scattered colonic diverticula. Stomach, large and small bowel grossly unremarkable. Vascular/Lymphatic: Aortic atherosclerosis. Prominent left inguinal lymph node has a short axis diameter of 14 mm. No other enlarged abdominal or pelvic lymph nodes. Reproductive: No visible focal abnormality. Other: No free fluid or free air. Musculoskeletal: No acute bony abnormality. IMPRESSION: No acute cardiopulmonary disease. Aortic atherosclerosis, coronary artery disease. Diffuse fatty infiltration of the liver. No renal or ureteral stones. No hydronephrosis. Stable left renal cysts. Prominent left inguinal lymph node with a short axis diameter of 1.4 cm. This could be followed clinically to ensure stability. Electronically Signed   By: Charlett NoseKevin  Dover M.D.   On: 11/07/2018 10:38   Ct Head Code Stroke Wo Contrast  Result Date: 11/07/2018 CLINICAL DATA:  Code stroke.  Speech difficulty.  Possible stroke. EXAM: CT HEAD WITHOUT CONTRAST TECHNIQUE: Contiguous axial images were obtained from the base of the skull through the vertex without intravenous contrast. COMPARISON:  Brain MRI 11/17/2012 FINDINGS: Brain: No evidence of acute infarction, hemorrhage, hydrocephalus, extra-axial collection or mass lesion/mass effect. Vascular: No hyperdense vessel or unexpected calcification. Skull: Normal. Negative for fracture or focal lesion. Sinuses/Orbits: No acute finding. Other: These results were called by telephone at the time of interpretation on 11/07/2018 at 10:32 am to provider [facility] FUNKE , who verbally acknowledged these results. ASPECTS [facility]([place] Stroke Program Early CT Score) - Ganglionic level infarction (caudate, lentiform nuclei, internal capsule, insula, M1-M3 cortex): 7 - Supraganglionic infarction (M4-M6 cortex): 3 Total score (0-10 with 10 being normal): 10 IMPRESSION: Negative  head CT. ASPECTS is 10. Electronically Signed   By: Marnee SpringJonathon  Watts M.D.   On: 11/07/2018 10:35   Dg Lumbar Puncture Fluoro Guide  Result Date: 11/07/2018 CLINICAL DATA:  Meningitis.  Fever and stroke symptoms. EXAM: DIAGNOSTIC LUMBAR PUNCTURE UNDER FLUOROSCOPIC GUIDANCE FLUOROSCOPY TIME:  Fluoroscopy Time:  1 minutes 42 seconds Radiation Exposure Index (if provided by the fluoroscopic device): 72.3 mGy Number of Acquired Spot Images: 1 PROCEDURE: After discussing the risks and benefits of this procedure with the patient informed consent was obtained. The back was sterilely prepped and draped. Following local anesthesia with 1% lidocaine a 22 gauge spinal needle was advanced into the lumbar spine thecal sac at the L4-L5 level under fluoroscopic guidance . 6 cc of CSF obtained. Initial CSF was slightly blood-tinged, this cleared. CSF was sent to the lab for further evaluation. Hemostasis achieved following needle removal. There no complications. IMPRESSION: Successful fluoroscopically directed lumbar puncture. CSF samples sent to the laboratory for further evaluation. Electronically Signed   By: Maisie Fushomas  Register   On: 11/07/2018 13:39      IMPRESSION AND PLAN:  Patient is a 63 year old male with history of hypertension, borderline diabetes mellitus, hyperlipidemia, obstructive sleep apnea for which patient uses CPAP at night and hypothyroidism being admitted for suspected meningitis and resolved neurological symptoms.  1.  Suspected meningitis Patient with fevers and neurological symptoms this morning with transient period of confusion which appeared resolved. CT head done was negative.  Patient already had lumbar puncture done in the emergency room. Follow-up on CSF analysis when available Continue empiric antibiotics with IV vancomycin and Rocephin to cover for suspected meningitis I have consulted infectious disease specialist.  Dr. Rivka Safer to see patient. Droplet and contact isolation until  meningitis ruled out Follow-up on urinalysis when patient able to make urine.  Patient initially went to urology clinic this morning with complaints of dysuria.  Already on empiric antibiotics with Rocephin.  2.  Resolving neurological symptoms. Patient initially brought in as a code stroke.  Symptoms resolved.  No TPA administered. Seen by neurologist already who recommended MRI MRA of head and neck which was ordered. Placed on aspirin.  Patient allergic to statins. PT and OT and speech therapy consult. 2D echocardiogram ordered  3.  Hypertension Holding off on home blood pressure meds for now due to borderline blood pressure  4.  Borderline diabetes mellitus Holding off on Metformin Placed on sliding scale insulin coverage.  Glycosylated hemoglobin level in a.m.  5.  Acute kidney injury Placed on IV fluid hydration.  Follow-up on renal function in a.m. Patient was dehydrated as evidenced by lactic acidosis.  Which is resolved with IV fluids.  6.  Hypothyroidism Continue Synthroid.  TSH level in a.m.  DVT prophylaxis; Lovenox  All the records are reviewed and case discussed with ED provider. Management plans discussed with the patient, family and they are in agreement. Updated wife present at bedside and treatment plans as outlined above   CODE STATUS: Full code  TOTAL TIME TAKING CARE OF THIS PATIENT: 63 minutes.    Whitney Hillegass M.D on 11/07/2018 at 3:18 PM  Between 7am to 6pm - Pager - [phone]  After 6pm go to [email or web address] - Scientist, research (life sciences) [facility]  Office  [phone]  CC: Primary care physician; Mickey Farber, MD   Note: This dictation was prepared with Dragon dictation along with smaller phrase technology. Any transcriptional errors that result from this process are unintentional.

## 2018-11-07 NOTE — ED Notes (Signed)
Blood cultures not drawn by this RN.

## 2018-11-07 NOTE — Consult Note (Signed)
Reason for Consult: L sided weakness  Referring Physician: Dr. Jari Pigg   CC: L sided weakness   HPI: Jacob Lawson is an 63 y.o. male with hx of DM, HTN, HLD last known well around 9:45 this AM. Patient wen to his primary care for abdominal pain and fever.  He was noted to have L sided weakness and near inability to move it.  Patient was brought in as code stroke. By the time he on in CT symptoms improved and currently appear to be nearly resolved except for possible dysarthria.  Possibly NIHSS of 1. CTH no acute abnormalities.    Past Medical History:  Diagnosis Date  . Carpal tunnel syndrome on both sides   . Depression   . Diabetes mellitus without complication ([place])   . GERD (gastroesophageal reflux disease)   . Hyperlipidemia   . Hypertension   . Hypothyroidism   . Sleep apnea    CPAP  . Wears hearing aid in both ears     Past Surgical History:  Procedure Laterality Date  . APPENDECTOMY    . CARPAL TUNNEL RELEASE Right 11/2003  . CARPAL TUNNEL RELEASE Left 11/19/2017   Procedure: LEFT OPEN CARPAL TUNNEL RELEASE;  Surgeon: Leanor Kail, MD;  Location: [place];  Service: Orthopedics;  Laterality: Left;  Diabetic - oral meds Sleep Apnea  . COLONOSCOPY     2/91, 6/00, 01/04, 7/08, 10/10, 12/13, 02/26/17  . ESOPHAGOGASTRODUODENOSCOPY     2/91, 1/95, 6/00, 1/04, 7/08, 10/10  . EXCISION NEUROMA Left 06/2007   foot  . FINGER SURGERY     for congenital webbing  . MANDIBLE SURGERY     in late 30s, fo overbite  . PROSTATE BIOPSY  12/2015  . ROTATOR CUFF REPAIR Left 02/2005  . THYROIDECTOMY, PARTIAL    . TONSILLECTOMY     and adenoidectomy    History reviewed. No pertinent family history.  Social History:  reports that he has never smoked. He has never used smokeless tobacco. He reports current alcohol use. He reports that he does not use drugs.  Allergies  Allergen Reactions  . Lamictal [Lamotrigine] Hives  . Statins     Muscle/joint aches    Medications:  I have reviewed the patient's current medications.  ROS: History obtained from the patient  General ROS: negative for - chills, fatigue, fever, night sweats, weight gain or weight loss Psychological ROS: negative for - behavioral disorder, hallucinations, memory difficulties, mood swings or suicidal ideation Ophthalmic ROS: negative for - blurry vision, double vision, eye pain or loss of vision ENT ROS: negative for - epistaxis, nasal discharge, oral lesions, sore throat, tinnitus or vertigo Allergy and Immunology ROS: negative for - hives or itchy/watery eyes Hematological and Lymphatic ROS: negative for - bleeding problems, bruising or swollen lymph nodes Endocrine ROS: negative for - galactorrhea, hair pattern changes, polydipsia/polyuria or temperature intolerance Respiratory ROS: negative for - cough, hemoptysis, shortness of breath or wheezing Cardiovascular ROS: negative for - chest pain, dyspnea on exertion, edema or irregular heartbeat Gastrointestinal ROS: negative for - abdominal pain, diarrhea, hematemesis, nausea/vomiting or stool incontinence Genito-Urinary ROS: negative for - dysuria, hematuria, incontinence or urinary frequency/urgency Musculoskeletal ROS: negative for - joint swelling or muscular weakness Neurological ROS: as noted in HPI Dermatological ROS: negative for rash and skin lesion changes  Physical Examination: Blood pressure 109/77, pulse (!) 101, temperature 98.8 F (37.1 C), temperature source Oral, resp. rate (!) 21, height 6' (1.829 m), weight 120.7 kg, SpO2 90 %.  Neurological Examination   Mental Status: Alert, oriented, thought content appropriate.  Possible dysarthria.  Cranial Nerves: II: Discs flat bilaterally; Visual fields grossly normal, pupils equal, round, reactive to light and accommodation III,IV, VI: ptosis not present, extra-ocular motions intact bilaterally V,VII: smile symmetric, facial light touch sensation normal bilaterally VIII:  hearing normal bilaterally IX,X: gag reflex present XI: bilateral shoulder shrug XII: midline tongue extension Motor: Right : Upper extremity   5/5    Left:     Upper extremity   5/5  Lower extremity   5/5     Lower extremity   5/5 Tone and bulk:normal tone throughout; no atrophy noted Sensory: Pinprick and light touch intact throughout, bilaterally Deep Tendon Reflexes: 1+ and symmetric throughout Plantars: Right: downgoing   Left: downgoing Cerebellar: Not tested      Laboratory Studies:   Basic Metabolic Panel: No results for input(s): NA, K, CL, CO2, GLUCOSE, BUN, CREATININE, CALCIUM, MG, PHOS in the last 168 hours.  Liver Function Tests: No results for input(s): AST, ALT, ALKPHOS, BILITOT, PROT, ALBUMIN in the last 168 hours. No results for input(s): LIPASE, AMYLASE in the last 168 hours. No results for input(s): AMMONIA in the last 168 hours.  CBC: Recent Labs  Lab 11/07/18 1005  WBC 10.7*  NEUTROABS 8.9*  HGB 14.2  HCT 43.0  MCV 82.4  PLT 117*    Cardiac Enzymes: No results for input(s): CKTOTAL, CKMB, CKMBINDEX, TROPONINI in the last 168 hours.  BNP: Invalid input(s): POCBNP  CBG: No results for input(s): GLUCAP in the last 168 hours.  Microbiology: No results found for this or any previous visit.  Coagulation Studies: Recent Labs    11/07/18 1005  LABPROT 15.3*  INR 1.2    Urinalysis: No results for input(s): COLORURINE, LABSPEC, PHURINE, GLUCOSEU, HGBUR, BILIRUBINUR, KETONESUR, PROTEINUR, UROBILINOGEN, NITRITE, LEUKOCYTESUR in the last 168 hours.  Invalid input(s): APPERANCEUR  Lipid Panel:  No results found for: CHOL, TRIG, HDL, CHOLHDL, VLDL, LDLCALC  HgbA1C: No results found for: HGBA1C  Urine Drug Screen:  No results found for: LABOPIA, COCAINSCRNUR, LABBENZ, AMPHETMU, THCU, LABBARB  Alcohol Level: No results for input(s): ETH in the last 168 hours.   Imaging: No results found.   Assessment/Plan:  63 y.o. male with hx of DM,  HTN, HLD last known well around 9:45 this AM. Patient wen to his primary care for abdominal pain and fever.  He was noted to have L sided weakness and near inability to move it.  Patient was brought in as code stroke. By the time he on in CT symptoms improved and currently appear to be nearly resolved except for possible dysarthria.  Possibly NIHSS of 1. CTH no acute abnormalities.   - Rapidly improving symptoms and close to baseline. No TPA - Same reason as above no stat CTA - Infectious work up specifically Covid testing - MRI/ MRA h/n when possible - ASA 325 daily.    11/07/2018, 10:36 AM

## 2018-11-07 NOTE — ED Notes (Signed)
Pt transferred to room 108 by this tech.

## 2018-11-07 NOTE — ED Triage Notes (Signed)
Blood glucose 214.

## 2018-11-07 NOTE — Consult Note (Signed)
Pharmacy Antibiotic Note  Jacob Lawson is a 63 y.o. male admitted on 11/07/2018 with suspected meningitis.  Pharmacy has been consulted for Vancomycin dosing.  Plan: Pt received a total load in ED of Vancomycin 2250mg  IV (1000mg  + 1250mg )  Will follow with Vancomycin 1250 mg IV Q 24 hrs. Goal AUC 400-550. Expected AUC: 490.3 SCr used: 1.83  (likely aki - should reassess renal fxn in the am and possibly redose early)   Height: 6' (182.9 cm) Weight: 266 lb 1.5 oz (120.7 kg) IBW/kg (Calculated) : 77.6  Temp (24hrs), Avg:98.8 F (37.1 C), Min:98.8 F (37.1 C), Max:98.8 F (37.1 C)  Recent Labs  Lab 11/07/18 1005 11/07/18 1247  WBC 10.7*  --   CREATININE 1.82*  --   LATICACIDVEN 8.1* 2.3*    Estimated Creatinine Clearance: 55.7 mL/min (A) (by C-G formula based on SCr of 1.82 mg/dL (H)).    Allergies  Allergen Reactions  . Lamictal [Lamotrigine] Hives  . Statins     Muscle/joint aches    Antimicrobials this admission: Vancomycin 10/16 >>  Dose adjustments this admission: None  Microbiology results: CSF cultures 10/16 >> pending COVID NEG  Thank you for allowing pharmacy to be a part of this patient's care.  Lu Duffel, PharmD, BCPS Clinical Pharmacist 11/07/2018 2:54 PM

## 2018-11-07 NOTE — ED Notes (Signed)
Dr. Funke at bedside for lumbar puncture. 

## 2018-11-07 NOTE — ED Notes (Signed)
Date and time results received: 11/07/18 1:15 PM (use smartphrase ".now" to insert current time)  Test: Lactic Acid Critical Value: 2.1  Name of Provider Notified: Dr. Jari Pigg  Orders Received? Or Actions Taken?: No new orders at this time.

## 2018-11-07 NOTE — ED Notes (Signed)
CODE STROKE CALLED TO 333 

## 2018-11-07 NOTE — ED Triage Notes (Signed)
Was at PCP for fever 102 and possible kidney infection.  On way to hospital pt began with aphasia and dysarthria. No left grip. In car. Pt was unable to get out of car. No initial movement of left side when arrived.  At 1006 am pt did start moving left arm some.  Dr Jari Pigg aware and at bedside

## 2018-11-07 NOTE — Consult Note (Signed)
PHARMACY -  BRIEF ANTIBIOTIC NOTE   Pharmacy has received consult(s) for Vancomycin from an ED provider.  The patient's profile has been reviewed for ht/wt/allergies/indication/available labs.    One time order(s) placed for Vancomycin 1000mg  by ED physician + 1250mg  one time dose entered by pharmacy to complete a loading dose of 2250mg  (discussed with nursing in ED)  Further antibiotics/pharmacy consults should be ordered by admitting physician if indicated.                       Thank you,  Lu Duffel, PharmD, BCPS Clinical Pharmacist 11/07/2018 12:04 PM

## 2018-11-07 NOTE — ED Notes (Signed)
Date and time results received: 11/07/18 10:36 AM (use smartphrase ".now" to insert current time)  Test: Lactic Critical Value: 8.1  Name of Provider Notified: Dr. Jari Pigg  Orders Received? Or Actions Taken?: No new orders at this time.

## 2018-11-07 NOTE — ED Notes (Signed)
Pt given 62mcg fentanyl at this time per MD request.  If BP maintains another 38mcg order will be placed.

## 2018-11-07 NOTE — ED Provider Notes (Addendum)
[facility] Emergency Department Provider Note  ____________________________________________   First MD Initiated Contact with Patient 11/07/18 1001     (approximate)  I have reviewed the triage vital signs and the nursing notes.   HISTORY  Chief Complaint Code Stroke    HPI Jacob Lawson is a 63 y.o. male with hypertension, hyperlipidemia, diabetes, hypothyroidism who presents with left-sided weakness.  Patient was recently being seen in clinic today for fevers and concern for possible UTI.  Patient denies any symptoms however he had sudden onset of left-sided weakness that started around 10 AM.  He also has some difficulty with speaking.  This been constant, nothing makes better, nothing makes it worse. He had temp of 101 at the clinic and some dysuria.   Unable to get full HPI due to patient's difficulty with speech.          Past Medical History:  Diagnosis Date   Carpal tunnel syndrome on both sides    Depression    Diabetes mellitus without complication (HCC)    GERD (gastroesophageal reflux disease)    Hyperlipidemia    Hypertension    Hypothyroidism    Sleep apnea    CPAP   Wears hearing aid in both ears     There are no active problems to display for this patient.   Past Surgical History:  Procedure Laterality Date   APPENDECTOMY     CARPAL TUNNEL RELEASE Right 11/2003   CARPAL TUNNEL RELEASE Left 11/19/2017   Procedure: LEFT OPEN CARPAL TUNNEL RELEASE;  Surgeon: Erin SonsKernodle, Harold, MD;  [facility];  Service: Orthopedics;  Laterality: Left;  Diabetic - oral meds Sleep Apnea   COLONOSCOPY     2/91, 6/00, 01/04, 7/08, 10/10, 12/13, 02/26/17   ESOPHAGOGASTRODUODENOSCOPY     2/91, 1/95, 6/00, 1/04, 7/08, 10/10   EXCISION NEUROMA Left 06/2007   foot   FINGER SURGERY     for congenital webbing   MANDIBLE SURGERY     in late 30s, fo overbite   PROSTATE BIOPSY  12/2015   ROTATOR CUFF REPAIR Left  02/2005   THYROIDECTOMY, PARTIAL     TONSILLECTOMY     and adenoidectomy    Prior to Admission medications   Medication Sig Start Date End Date Taking? Authorizing Provider  amoxicillin-clavulanate (AUGMENTIN) 875-125 MG tablet Take 1 tablet by mouth every 12 (twelve) hours. 12/30/17   Tommie Samsook, Jayce G, DO  aspirin 81 MG tablet Take 81 mg by mouth daily.    [provider]  chlorpheniramine-HYDROcodone (TUSSIONEX PENNKINETIC ER) 10-8 MG/5ML SUER Take 5 mLs by mouth every 12 (twelve) hours as needed. 12/30/17   Tommie Samsook, Jayce G, DO  Cholecalciferol (VITAMIN D3) 2000 units TABS Take 1 tablet by mouth daily.    [provider]  co-enzyme Q-10 50 MG capsule Take 50 mg by mouth daily.    [provider]  esomeprazole (NEXIUM) 20 MG packet Take 20 mg by mouth daily before breakfast.    [provider]  finasteride (PROSCAR) 5 MG tablet Take 5 mg by mouth daily.    [provider]  fluticasone (FLONASE) 50 MCG/ACT nasal spray Place 1 spray into both nostrils daily.    [provider]  Boris LownKrill Oil 1000 MG CAPS Take 1 capsule by mouth 2 (two) times daily.    [provider]  levothyroxine (SYNTHROID, LEVOTHROID) 125 MCG tablet Take 125 mcg by mouth daily before breakfast.    [provider]  liothyronine (  CYTOMEL) 25 MCG tablet Take by mouth daily.    [provider]  lisinopril (PRINIVIL,ZESTRIL) 20 MG tablet Take 20 mg by mouth daily.    [provider]  loratadine (CLARITIN) 10 MG tablet Take 10 mg by mouth daily as needed for allergies.    [provider]  metFORMIN (GLUCOPHAGE) 500 MG tablet Take 1,000 mg by mouth daily with breakfast.    [provider]  metoprolol succinate (TOPROL-XL) 50 MG 24 hr tablet Take 50 mg by mouth daily. Take with or immediately following a meal.    [provider]  Multiple Vitamins-Minerals (CENTRUM SILVER PO) Take by mouth daily.    [provider]    sertraline (ZOLOFT) 25 MG tablet Take 25 mg by mouth every other day.    [provider]  TESTOSTERONE TD Place 1 % onto the skin 2 (two) times daily.    [provider]  vitamin B-12 (CYANOCOBALAMIN) 500 MCG tablet Take 500 mcg by mouth daily.    [provider]    Allergies Lamictal [lamotrigine] and Statins  No family history on file.  Social History Social History   Tobacco Use   Smoking status: Never Smoker   Smokeless tobacco: Never Used  Substance Use Topics   Alcohol use: Yes    Comment: occasional beer   Drug use: No      Review of Systems Constitutional: Positive fever Eyes: No visual changes. ENT: No sore throat. Cardiovascular: Denies chest pain. Respiratory: Denies shortness of breath. Gastrointestinal: No abdominal pain.  No nausea, no vomiting.  No diarrhea.  No constipation. Genitourinary: Positive dysuria Musculoskeletal: Negative for back pain. Skin: Negative for rash. Neurological: Negative for headaches, focal weakness or numbness. Somewhat limited due to difficulty with speaking. ____________________________________________   PHYSICAL EXAM:  VITAL SIGNS: Blood pressure 107/65, pulse 93, temperature 98.8 F (37.1 C), temperature source Oral, resp. rate (!) 23, height 6' (1.829 m), weight 120.7 kg, SpO2 95 %.  Constitutional: Diaphoretic, looking around, difficulty with speech Eyes: Conjunctivae are normal. EOMI. Head: Atraumatic. Nose: No congestion/rhinnorhea. Mouth/Throat: Mucous membranes are moist.   Neck: No stridor. Trachea Midline. FROM Cardiovascular: Normal rate, regular rhythm. Grossly normal heart sounds.  Good peripheral circulation. Respiratory: Normal respiratory effort.  No retractions. Lungs CTAB. Gastrointestinal: Soft and nontender. No distention. No abdominal bruits.  Musculoskeletal: No lower extremity tenderness nor edema.  No joint effusions. Neurologic: Difficulty speech with left-sided  weakness..  Skin:  Skin is warm, dry and intact. No rash noted. Psychiatric: Unable to fully assess GU: Deferred   ____________________________________________   LABS (all labs ordered are listed, but only abnormal results are displayed)  Labs Reviewed  PROTIME-INR - Abnormal; Notable for the following components:      Result Value   Prothrombin Time 15.3 (*)    All other components within normal limits  CBC - Abnormal; Notable for the following components:   WBC 10.7 (*)    Platelets 117 (*)    All other components within normal limits  DIFFERENTIAL - Abnormal; Notable for the following components:   Neutro Abs 8.9 (*)    Abs Immature Granulocytes 0.09 (*)    All other components within normal limits  COMPREHENSIVE METABOLIC PANEL - Abnormal; Notable for the following components:   Sodium 131 (*)    Chloride 95 (*)    CO2 17 (*)    Glucose, Bld 206 (*)    BUN 25 (*)    Creatinine, Ser 1.82 (*)  Calcium 8.7 (*)    AST 44 (*)    Total Bilirubin 1.3 (*)    GFR calc non Af Amer 39 (*)    GFR calc Af Amer 45 (*)    Anion gap 19 (*)    All other components within normal limits  LACTIC ACID, PLASMA - Abnormal; Notable for the following components:   Lactic Acid, Venous 8.1 (*)    All other components within normal limits  GLUCOSE, CAPILLARY - Abnormal; Notable for the following components:   Glucose-Capillary 214 (*)    All other components within normal limits  SARS CORONAVIRUS 2 BY RT PCR (HOSPITAL ORDER, PERFORMED IN [facility])  CULTURE, BLOOD (ROUTINE X 2)  CULTURE, BLOOD (ROUTINE X 2)  CSF CULTURE  GRAM STAIN  APTT  TSH  T4, FREE  LACTIC ACID, PLASMA  URINALYSIS, COMPLETE (UACMP) WITH MICROSCOPIC  CSF CELL COUNT WITH DIFFERENTIAL  CSF CELL COUNT WITH DIFFERENTIAL  PROTEIN AND GLUCOSE, CSF  OLIGOCLONAL BANDS, CSF + SERM  DRAW EXTRA CLOT TUBE  CBG MONITORING, ED   ____________________________________________   ED ECG REPORT I, Concha Se, the attending physician, personally viewed and interpreted this ECG.  Is sinus  rate of 99, no ST elevation, T wave inversion in V3 through V6.  No ST elevation ____________________________________________  RADIOLOGY Vela Prose, personally viewed and evaluated these images (plain radiographs) as part of my medical decision making, as well as reviewing the written report by the radiologist.  ED MD interpretation: Chest x-ray no pneumonia  Official radiology report(s): Ct Chest Wo Contrast  Result Date: 11/07/2018 CLINICAL DATA:  Fever of unknown origin. EXAM: CT CHEST, ABDOMEN AND PELVIS WITHOUT CONTRAST TECHNIQUE: Multidetector CT imaging of the chest, abdomen and pelvis was performed following the standard protocol without IV contrast. COMPARISON:  11/14/2017 CT abdomen and pelvis. FINDINGS: CT CHEST FINDINGS Cardiovascular: Heart is normal size. Aorta is normal caliber. Scattered aortic and coronary artery calcifications. Mediastinum/Nodes: No mediastinal, hilar, or axillary adenopathy. Trachea and esophagus are unremarkable. Thyroid unremarkable. Lungs/Pleura: Lungs are clear. No focal airspace opacities or suspicious nodules. No effusions. Musculoskeletal: Chest wall soft tissues are unremarkable. No acute bony abnormality. CT ABDOMEN PELVIS FINDINGS Hepatobiliary: Diffuse low-density throughout the liver compatible with fatty infiltration. No focal abnormality. Gallbladder unremarkable. Pancreas: No focal abnormality or ductal dilatation. Spleen: No focal abnormality.  Normal size. Adrenals/Urinary Tract: Exophytic cyst off the lower pole of the left kidney measures up to 4.5 cm, stable. Exophytic cyst anteriorly off the lower pole of the left kidney also stable. No renal or ureteral stones. No hydronephrosis. Adrenal glands and urinary bladder unremarkable. Stomach/Bowel: Few scattered colonic diverticula. Stomach, large and small bowel grossly unremarkable. Vascular/Lymphatic: Aortic  atherosclerosis. Prominent left inguinal lymph node has a short axis diameter of 14 mm. No other enlarged abdominal or pelvic lymph nodes. Reproductive: No visible focal abnormality. Other: No free fluid or free air. Musculoskeletal: No acute bony abnormality. IMPRESSION: No acute cardiopulmonary disease. Aortic atherosclerosis, coronary artery disease. Diffuse fatty infiltration of the liver. No renal or ureteral stones. No hydronephrosis. Stable left renal cysts. Prominent left inguinal lymph node with a short axis diameter of 1.4 cm. This could be followed clinically to ensure stability. Electronically Signed   By: Charlett Nose M.D.   On: 11/07/2018 10:38   Dg Chest Portable 1 View  Result Date: 11/07/2018 CLINICAL DATA:  Fever EXAM: PORTABLE CHEST 1 VIEW COMPARISON:  04/03/2006 FINDINGS: The heart size and mediastinal contours  are within normal limits. Both lungs are clear. The visualized skeletal structures are unremarkable. IMPRESSION: No acute abnormality of the lungs in AP portable projection. Electronically Signed   By: Lauralyn Primes M.D.   On: 11/07/2018 10:52   Ct Renal Stone Study  Result Date: 11/07/2018 CLINICAL DATA:  Fever of unknown origin. EXAM: CT CHEST, ABDOMEN AND PELVIS WITHOUT CONTRAST TECHNIQUE: Multidetector CT imaging of the chest, abdomen and pelvis was performed following the standard protocol without IV contrast. COMPARISON:  11/14/2017 CT abdomen and pelvis. FINDINGS: CT CHEST FINDINGS Cardiovascular: Heart is normal size. Aorta is normal caliber. Scattered aortic and coronary artery calcifications. Mediastinum/Nodes: No mediastinal, hilar, or axillary adenopathy. Trachea and esophagus are unremarkable. Thyroid unremarkable. Lungs/Pleura: Lungs are clear. No focal airspace opacities or suspicious nodules. No effusions. Musculoskeletal: Chest wall soft tissues are unremarkable. No acute bony abnormality. CT ABDOMEN PELVIS FINDINGS Hepatobiliary: Diffuse low-density throughout  the liver compatible with fatty infiltration. No focal abnormality. Gallbladder unremarkable. Pancreas: No focal abnormality or ductal dilatation. Spleen: No focal abnormality.  Normal size. Adrenals/Urinary Tract: Exophytic cyst off the lower pole of the left kidney measures up to 4.5 cm, stable. Exophytic cyst anteriorly off the lower pole of the left kidney also stable. No renal or ureteral stones. No hydronephrosis. Adrenal glands and urinary bladder unremarkable. Stomach/Bowel: Few scattered colonic diverticula. Stomach, large and small bowel grossly unremarkable. Vascular/Lymphatic: Aortic atherosclerosis. Prominent left inguinal lymph node has a short axis diameter of 14 mm. No other enlarged abdominal or pelvic lymph nodes. Reproductive: No visible focal abnormality. Other: No free fluid or free air. Musculoskeletal: No acute bony abnormality. IMPRESSION: No acute cardiopulmonary disease. Aortic atherosclerosis, coronary artery disease. Diffuse fatty infiltration of the liver. No renal or ureteral stones. No hydronephrosis. Stable left renal cysts. Prominent left inguinal lymph node with a short axis diameter of 1.4 cm. This could be followed clinically to ensure stability. Electronically Signed   By: Charlett Nose M.D.   On: 11/07/2018 10:38   Ct Head Code Stroke Wo Contrast  Result Date: 11/07/2018 CLINICAL DATA:  Code stroke.  Speech difficulty.  Possible stroke. EXAM: CT HEAD WITHOUT CONTRAST TECHNIQUE: Contiguous axial images were obtained from the base of the skull through the vertex without intravenous contrast. COMPARISON:  Brain MRI 11/17/2012 FINDINGS: Brain: No evidence of acute infarction, hemorrhage, hydrocephalus, extra-axial collection or mass lesion/mass effect. Vascular: No hyperdense vessel or unexpected calcification. Skull: Normal. Negative for fracture or focal lesion. Sinuses/Orbits: No acute finding. Other: These results were called by telephone at the time of interpretation on  11/07/2018 at 10:32 am to provider [facility] , who verbally acknowledged these results. ASPECTS [facility] Stroke Program Early CT Score) - Ganglionic level infarction (caudate, lentiform nuclei, internal capsule, insula, M1-M3 cortex): 7 - Supraganglionic infarction (M4-M6 cortex): 3 Total score (0-10 with 10 being normal): 10 IMPRESSION: Negative head CT. ASPECTS is 10. Electronically Signed   By: Marnee Spring M.D.   On: 11/07/2018 10:35    ____________________________________________   PROCEDURES  Procedure(s) performed (including Critical Care):  .Critical Care Performed by: Concha Se, MD Authorized by: Concha Se, MD   Critical care provider statement:    Critical care time (minutes):  45   Critical care was necessary to treat or prevent imminent or life-threatening deterioration of the following conditions:  CNS failure or compromise   Critical care was time spent personally by me on the following activities:  Discussions with consultants, evaluation of patient's response to treatment, examination of  patient, ordering and performing treatments and interventions, ordering and review of laboratory studies, ordering and review of radiographic studies, pulse oximetry, re-evaluation of patient's condition, obtaining history from patient or surrogate and review of old charts .Lumbar Puncture  Date/Time: 11/07/2018 12:55 PM Performed by: Concha Se, MD Authorized by: Concha Se, MD   Consent:    Consent obtained:  Written   Consent given by:  Patient   Risks discussed:  Bleeding, headache, infection, nerve damage, pain and repeat procedure Pre-procedure details:    Procedure purpose:  Diagnostic Procedure details:    Lumbar space:  L3-L4 interspace   Needle gauge:  20   Needle type:  Spinal needle - Quincke tip   Needle length (in):  2.5   Number of attempts:  3 Comments:     Unable to obtain fluid      ____________________________________________   INITIAL  IMPRESSION / ASSESSMENT AND PLAN / ED COURSE  Jacob Lawson was evaluated in Emergency Department on 11/07/2018 for the symptoms described in the history of present illness. He was evaluated in the context of the global COVID-19 pandemic, which necessitated consideration that the patient might be at risk for infection with the SARS-CoV-2 virus that causes COVID-19. Institutional protocols and algorithms that pertain to the evaluation of patients at risk for COVID-19 are in a state of rapid change based on information released by regulatory bodies including the CDC and federal and state organizations. These policies and algorithms were followed during the patient's care in the ED.    Patient is a 63 year old who presented with fever and dysuria.  He was coming over here to get a rapid Covid done when he had sudden onset of confusion, difficulty speaking and left-sided weakness.  This concerning for stroke.  Stroke code was called.  No chest pain to suggest dissection.  Patient rapidly improved within a few minutes and upon neuro assessment was back to baseline.  He does not really remember the event.  This is now concerning for meningitis in the setting of the fever with neuro deficits.  Will get LP to further evaluate.  Also consider coronavirus causing TIA.  Will get Covid testing as well.  Patient went down for CT head I also add on a CT chest given slightly hypoxic as well CT abdomen given the report of dysuria to rule out infected kidney stone. Lactate significant elevated seizure while being driven here, and perhaps pt had todd paralysis/post ictal.  With fever consider septic emboli. No obvious signs of endocarditis.   10:11 AM neuro exam rapidly improving.   10:54 AM patient was evaluated by neurology and agreed that given improving neuro exam TPA was withheld.  CT head was negative, CT renal was negative, CT chest was negative.  Lactate elevated 8.1.  Will start patient on antibiotics and fluid.   Will cover for meningitis given the left-sided weakness that is now resolved.  D/w zeylikman from neurology.  No TPA at this time.  11:00 AM reevaluated patient.  Patient is completely back to baseline.  He reports that he went in and had a fever of 101 in clinic and some urinary symptoms.  He was coming over here to get Covid testing when he had a sudden left-sided weakness and difficulty speaking.  He says he does not really remember exactly what happened.  He endorses a chronic headache but no neck stiffness.  We discussed lumbar puncture given the neuro deficits plus fever we discussed the benefits and  risk.  Patient is okay with proceeding with lumbar puncture.  We will give antibiotics to cover for  12:19 PM lumbar puncture attempted x3.  Discussed with Dr. Register and they will perform fluoroscopy guided LP.  Discussed with the hospital team for admission given elevated lactate, need for stroke work-up, rule out meningitis work-up. ____________________________________________   FINAL CLINICAL IMPRESSION(S) / ED DIAGNOSES   Final diagnoses:  TIA (transient ischemic attack)  Lactate blood increase  Fever, unspecified fever cause      MEDICATIONS GIVEN DURING THIS VISIT:  Medications  sodium chloride flush (NS) 0.9 % injection 3 mL (has no administration in time range)  vancomycin (VANCOCIN) IVPB 1000 mg/200 mL premix (1,000 mg Intravenous New Bag/Given 11/07/18 1212)  vancomycin (VANCOCIN) 1,250 mg in sodium chloride 0.9 % 250 mL IVPB (has no administration in time range)  sodium chloride 0.9 % bolus 1,000 mL (0 mLs Intravenous Stopped 11/07/18 1150)  dexamethasone (DECADRON) injection 10 mg (10 mg Intravenous Given 11/07/18 1125)  cefTRIAXone (ROCEPHIN) 2 g in sodium chloride 0.9 % 100 mL IVPB (0 g Intravenous Stopped 11/07/18 1205)  fentaNYL (SUBLIMAZE) injection 100 mcg (100 mcg Intravenous Given 11/07/18 1145)  ondansetron (ZOFRAN) injection 4 mg (4 mg Intravenous Given  11/07/18 1125)  sodium chloride 0.9 % bolus 1,000 mL (1,000 mLs Intravenous New Bag/Given 11/07/18 1156)  fentaNYL (SUBLIMAZE) injection 25 mcg (25 mcg Intravenous Given 11/07/18 1222)     ED Discharge Orders    None       Note:  This document was prepared using Dragon voice recognition software and may include unintentional dictation errors.   Concha Se, MD 11/07/18 1255    Concha Se, MD 11/08/18 [phone]

## 2018-11-07 NOTE — ED Notes (Signed)
Pt oxygen sats 88% on RA.  Placed on 2L [place] and up to 90%.  Oxygen now at 4L [place] with O2 at 93%.

## 2018-11-08 ENCOUNTER — Inpatient Hospital Stay
Admit: 2018-11-08 | Discharge: 2018-11-08 | Disposition: A | Discharge status: Home / Self Care | Payer: BC Managed Care – PPO | Attending: Internal Medicine | Admitting: Internal Medicine

## 2018-11-08 ENCOUNTER — Encounter: Payer: Self-pay | Admitting: Radiology

## 2018-11-08 ENCOUNTER — Inpatient Hospital Stay: Payer: BC Managed Care – PPO

## 2018-11-08 LAB — MAGNESIUM: Magnesium: 2.1 mg/dL (ref 1.7–2.4)

## 2018-11-08 LAB — BLOOD GAS, ARTERIAL
Acid-base deficit: 1.5 mmol/L (ref 0.0–2.0)
Allens test (pass/fail): POSITIVE — AB
Bicarbonate: 21.3 mmol/L (ref 20.0–28.0)
FIO2: 0.32
O2 Saturation: 97.6 %
Patient temperature: 37
pCO2 arterial: 30 mmHg — ABNORMAL LOW (ref 32.0–48.0)
pH, Arterial: 7.46 — ABNORMAL HIGH (ref 7.350–7.450)
pO2, Arterial: 93 mmHg (ref 83.0–108.0)

## 2018-11-08 LAB — GLUCOSE, CAPILLARY
Glucose-Capillary: 114 mg/dL — ABNORMAL HIGH (ref 70–99)
Glucose-Capillary: 143 mg/dL — ABNORMAL HIGH (ref 70–99)
Glucose-Capillary: 130 mg/dL — ABNORMAL HIGH (ref 70–99)
Glucose-Capillary: 145 mg/dL — ABNORMAL HIGH (ref 70–99)

## 2018-11-08 LAB — BASIC METABOLIC PANEL
Anion gap: 7 (ref 5–15)
BUN: 23 mg/dL (ref 8–23)
CO2: 24 mmol/L (ref 22–32)
Calcium: 8.3 mg/dL — ABNORMAL LOW (ref 8.9–10.3)
Chloride: 105 mmol/L (ref 98–111)
Creatinine, Ser: 1.41 mg/dL — ABNORMAL HIGH (ref 0.61–1.24)
GFR calc Af Amer: >60 mL/min (ref >60)
GFR calc non Af Amer: 53 mL/min — ABNORMAL LOW (ref >60)
Glucose, Bld: 193 mg/dL — ABNORMAL HIGH (ref 70–99)
Potassium: 3.8 mmol/L (ref 3.5–5.1)
Sodium: 136 mmol/L (ref 135–145)

## 2018-11-08 LAB — BLOOD CULTURE ID PANEL (REFLEXED)

## 2018-11-08 LAB — CBC
HCT: 37.1 % — ABNORMAL LOW (ref 39.0–52.0)
Hemoglobin: 12.1 g/dL — ABNORMAL LOW (ref 13.0–17.0)
MCH: 27.1 pg (ref 26.0–34.0)
MCHC: 32.6 g/dL (ref 30.0–36.0)
MCV: 83.2 fL (ref 80.0–100.0)
Platelets: 96 10*3/uL — ABNORMAL LOW (ref 150–400)
RBC: 4.46 MIL/uL (ref 4.22–5.81)
RDW: 14.5 % (ref 11.5–15.5)
WBC: 10.1 10*3/uL (ref 4.0–10.5)
nRBC: 0 % (ref 0.0–0.2)

## 2018-11-08 LAB — TSH: TSH: 0.522 u[IU]/mL (ref 0.350–4.500)

## 2018-11-08 LAB — HIV ANTIBODY (ROUTINE TESTING W REFLEX): HIV Screen 4th Generation wRfx: NONREACTIVE

## 2018-11-08 LAB — HEMOGLOBIN A1C
Hgb A1c MFr Bld: 6.7 % — ABNORMAL HIGH (ref 4.8–5.6)
Mean Plasma Glucose: 145.59 mg/dL

## 2018-11-08 LAB — PROCALCITONIN: Procalcitonin: 16.53 ng/mL

## 2018-11-08 LAB — LACTIC ACID, PLASMA
Lactic Acid, Venous: 2.8 mmol/L (ref 0.5–1.9)
Lactic Acid, Venous: 2.6 mmol/L (ref 0.5–1.9)

## 2018-11-08 LAB — TROPONIN I (HIGH SENSITIVITY)
Troponin I (High Sensitivity): 112 ng/L (ref <18)
Troponin I (High Sensitivity): 268 ng/L (ref <18)

## 2018-11-08 MED ORDER — ACETAMINOPHEN 325 MG PO TABS
ORAL_TABLET | ORAL | Status: AC
  Administered 2018-11-08: 09:00:00 650 mg
  Filled 2018-11-08: qty 2

## 2018-11-08 MED ORDER — ONDANSETRON HCL 4 MG/2ML IJ SOLN
4.0000 mg | Freq: Four times a day (QID) | INTRAMUSCULAR | Status: DC | PRN
  Administered 2018-11-08 – 2018-11-09 (×2): 4 mg via INTRAVENOUS
  Filled 2018-11-08: qty 2

## 2018-11-08 MED ORDER — HEPARIN BOLUS VIA INFUSION
3000.0000 [IU] | Freq: Once | INTRAVENOUS | Status: AC
  Administered 2018-11-08: 3000 [IU] via INTRAVENOUS
  Filled 2018-11-08: qty 3000

## 2018-11-08 MED ORDER — VANCOMYCIN HCL 1.5 G IV SOLR
1500.0000 mg | INTRAVENOUS | Status: DC
  Filled 2018-11-08: qty 1500

## 2018-11-08 MED ORDER — METOPROLOL TARTRATE 5 MG/5ML IV SOLN
5.0000 mg | Freq: Once | INTRAVENOUS | Status: AC
  Administered 2018-11-08: 5 mg via INTRAVENOUS
  Filled 2018-11-08: qty 5

## 2018-11-08 MED ORDER — LORAZEPAM 2 MG/ML IJ SOLN
INTRAMUSCULAR | Status: AC
  Administered 2018-11-08: 1 mg
  Filled 2018-11-08: qty 1

## 2018-11-08 MED ORDER — FUROSEMIDE 10 MG/ML IJ SOLN
20.0000 mg | Freq: Once | INTRAMUSCULAR | Status: AC
  Administered 2018-11-08: 17:00:00 20 mg via INTRAVENOUS
  Filled 2018-11-08: qty 2

## 2018-11-08 MED ORDER — ACETAMINOPHEN 325 MG PO TABS
650.0000 mg | ORAL_TABLET | Freq: Four times a day (QID) | ORAL | Status: DC | PRN
  Administered 2018-11-08: 650 mg via ORAL
  Filled 2018-11-08: qty 2

## 2018-11-08 MED ORDER — HEPARIN (PORCINE) 25000 UT/250ML-% IV SOLN
1750.0000 [IU]/h | INTRAVENOUS | Status: DC
  Administered 2018-11-08: 18:00:00 1450 [IU]/h via INTRAVENOUS
  Administered 2018-11-09: 1750 [IU]/h via INTRAVENOUS
  Filled 2018-11-08 (×2): qty 250

## 2018-11-08 MED ORDER — TRAZODONE HCL 50 MG PO TABS
50.0000 mg | ORAL_TABLET | Freq: Every evening | ORAL | Status: DC | PRN
  Administered 2018-11-08 – 2018-11-09 (×2): 50 mg via ORAL
  Filled 2018-11-08 (×2): qty 1

## 2018-11-08 MED ORDER — LORAZEPAM 2 MG/ML IJ SOLN: 1.0000 mg | Freq: Once | INTRAMUSCULAR | Status: DC

## 2018-11-08 MED ORDER — LEVALBUTEROL HCL 1.25 MG/0.5ML IN NEBU
1.2500 mg | INHALATION_SOLUTION | Freq: Four times a day (QID) | RESPIRATORY_TRACT | Status: DC
  Administered 2018-11-08 (×2): 1.25 mg via RESPIRATORY_TRACT
  Filled 2018-11-08 (×2): qty 0.5

## 2018-11-08 MED ORDER — IOHEXOL 350 MG/ML SOLN
75.0000 mL | Freq: Once | INTRAVENOUS | Status: AC | PRN
  Administered 2018-11-08: 11:00:00 75 mL via INTRAVENOUS

## 2018-11-08 NOTE — Progress Notes (Addendum)
PHARMACY - PHYSICIAN COMMUNICATION CRITICAL VALUE ALERT - BLOOD CULTURE IDENTIFICATION (BCID)  Jacob Lawson is an 63 y.o. male who presented to [facility] on 11/07/2018 with a chief complaint of weakness.  Assessment:  Lab reported 1 of 4 bottles + for proteus  Name of physician (or Provider) Contacted: Dr Marcille Blanco  Current antibiotics: Rocephin 2gm IV q24hrs  Changes to prescribed antibiotics recommended:  Patient is on recommended antibiotics - No changes needed  No results found for this or any previous visit.  [name], Marvin A 11/08/2018  5:11 AM

## 2018-11-08 NOTE — Consult Note (Signed)
Reason for Consult: Cellulitis with possible abscess left foot. Referring Physician: Adewale Pucillo is an 63 y.o. male.  HPI: This is a 63 year old male with recent development of some swelling and redness in his left foot.  He does relate that a couple of months ago he thinks he could have possibly stepped on something but does not recall any specific injury.  He works a lot around [employer].  Recently admitted with concerns for possible meningitis and consult was made for evaluation of the swelling and redness in the left foot.  Past Medical History:  Diagnosis Date  . Carpal tunnel syndrome on both sides   . Depression   . Diabetes mellitus without complication (HCC)   . GERD (gastroesophageal reflux disease)   . Hyperlipidemia   . Hypertension   . Hypothyroidism   . Sleep apnea    CPAP  . Wears hearing aid in both ears     Past Surgical History:  Procedure Laterality Date  . APPENDECTOMY    . CARPAL TUNNEL RELEASE Right 11/2003  . CARPAL TUNNEL RELEASE Left 11/19/2017   Procedure: LEFT OPEN CARPAL TUNNEL RELEASE;  Surgeon: Erin Sons, MD;  Location: [facility];  Service: Orthopedics;  Laterality: Left;  Diabetic - oral meds Sleep Apnea  . COLONOSCOPY     2/91, 6/00, 01/04, 7/08, 10/10, 12/13, 02/26/17  . ESOPHAGOGASTRODUODENOSCOPY     2/91, 1/95, 6/00, 1/04, 7/08, 10/10  . EXCISION NEUROMA Left 06/2007   foot  . FINGER SURGERY     for congenital webbing  . MANDIBLE SURGERY     in late 30s, fo overbite  . PROSTATE BIOPSY  12/2015  . ROTATOR CUFF REPAIR Left 02/2005  . THYROIDECTOMY, PARTIAL    . TONSILLECTOMY     and adenoidectomy    History reviewed. No pertinent family history.  Social History:  reports that he has never smoked. He has never used smokeless tobacco. He reports current alcohol use. He reports that he does not use drugs.  Allergies:  Allergies  Allergen Reactions  . Lamictal [Lamotrigine] Hives  . Statins     Muscle/joint  aches    Medications:  Scheduled: . aspirin  325 mg Oral Daily  . cholecalciferol  2,000 Units Oral Daily  . enoxaparin (LOVENOX) injection  40 mg Subcutaneous Q24H  . finasteride  5 mg Oral QPM  . insulin aspart  0-5 Units Subcutaneous QHS  . insulin aspart  0-9 Units Subcutaneous TID WC  . levalbuterol  1.25 mg Nebulization Q6H  . levothyroxine  125 mcg Oral QAC breakfast  . liothyronine  12.5 mcg Oral Daily  . LORazepam  1 mg Intravenous Once  . multivitamin with minerals  1 tablet Oral Daily  . pantoprazole  40 mg Oral Daily  . sertraline  50 mg Oral QPM  . sodium chloride flush  3 mL Intravenous Once  . vitamin B-12  500 mcg Oral Daily    Results for orders placed or performed during the hospital encounter of 11/07/18 (from the past 48 hour(s))  Glucose, capillary     Status: Abnormal   Collection Time: 11/07/18  9:57 AM  Result Value Ref Range   Glucose-Capillary 214 (H) 70 - 99 mg/dL  Urinalysis, Complete w Microscopic     Status: Abnormal   Collection Time: 11/07/18  9:59 AM  Result Value Ref Range   Color, Urine YELLOW (A) YELLOW   APPearance CLOUDY (A) CLEAR   Specific Gravity, Urine 1.015 1.005 -  1.030   pH 5.0 5.0 - 8.0   Glucose, UA >=500 (A) NEGATIVE mg/dL   Hgb urine dipstick MODERATE (A) NEGATIVE   Bilirubin Urine NEGATIVE NEGATIVE   Ketones, ur NEGATIVE NEGATIVE mg/dL   Protein, ur 829 (A) NEGATIVE mg/dL   Nitrite NEGATIVE NEGATIVE   Leukocytes,Ua NEGATIVE NEGATIVE   RBC / HPF >50 (H) 0 - 5 RBC/hpf   WBC, UA 6-10 0 - 5 WBC/hpf   Bacteria, UA NONE SEEN NONE SEEN   Squamous Epithelial / LPF 0-5 0 - 5   Mucus PRESENT    Amorphous Crystal PRESENT     Comment: Performed at [facility], [address] [place], [place] [address]  Protime-INR     Status: Abnormal   Collection Time: 11/07/18 10:05 AM  Result Value Ref Range   Prothrombin Time 15.3 (H) 11.4 - 15.2 seconds   INR 1.2 0.8 - 1.2    Comment: (NOTE) INR goal varies based on device  and disease states. Performed at [facility], [address] [place], [place] [address]   APTT     Status: None   Collection Time: 11/07/18 10:05 AM  Result Value Ref Range   aPTT 35 24 - 36 seconds    Comment: Performed at [facility], [address] [place], [place] [address]  CBC     Status: Abnormal   Collection Time: 11/07/18 10:05 AM  Result Value Ref Range   WBC 10.7 (H) 4.0 - 10.5 K/uL   RBC 5.22 4.22 - 5.81 MIL/uL   Hemoglobin 14.2 13.0 - 17.0 g/dL   HCT 29.5 28.4 - 13.2 %   MCV 82.4 80.0 - 100.0 fL   MCH 27.2 26.0 - 34.0 pg   MCHC 33.0 30.0 - 36.0 g/dL   RDW 44.0 10.2 - 72.5 %   Platelets 117 (L) 150 - 400 K/uL    Comment: Immature Platelet Fraction may be clinically indicated, consider ordering this additional test [id number]    nRBC 0.0 0.0 - 0.2 %    Comment: Performed at [facility], [address] [place], [place] [address]  Differential     Status: Abnormal   Collection Time: 11/07/18 10:05 AM  Result Value Ref Range   Neutrophils Relative % 82 %   Neutro Abs 8.9 (H) 1.7 - 7.7 K/uL   Lymphocytes Relative 10 %   Lymphs Abs 1.0 0.7 - 4.0 K/uL   Monocytes Relative 5 %   Monocytes Absolute 0.5 0.1 - 1.0 K/uL   Eosinophils Relative 1 %   Eosinophils Absolute 0.1 0.0 - 0.5 K/uL   Basophils Relative 1 %   Basophils Absolute 0.1 0.0 - 0.1 K/uL   Immature Granulocytes 1 %   Abs Immature Granulocytes 0.09 (H) 0.00 - 0.07 K/uL    Comment: Performed at [facility], [address] [place], [place] [address]  Comprehensive metabolic panel     Status: Abnormal   Collection Time: 11/07/18 10:05 AM  Result Value Ref Range   Sodium 131 (L) 135 - 145 mmol/L   Potassium 3.9 3.5 - 5.1 mmol/L   Chloride 95 (L) 98 - 111 mmol/L   CO2 17 (L) 22 - 32 mmol/L   Glucose, Bld 206 (H) 70 - 99 mg/dL   BUN 25 (H) 8 - 23 mg/dL   Creatinine, Ser 5.64 (H) 0.61 - 1.24 mg/dL   Calcium 8.7 (L) 8.9 - 10.3 mg/dL   Total Protein 8.1 6.5 - 8.1  g/dL  Albumin 4.1 3.5 - 5.0 g/dL   AST 44 (H) 15 - 41 U/L   ALT 27 0 - 44 U/L   Alkaline Phosphatase 61 38 - 126 U/L   Total Bilirubin 1.3 (H) 0.3 - 1.2 mg/dL   GFR calc non Af Amer 39 (L) >60 mL/min   GFR calc Af Amer 45 (L) >60 mL/min   Anion gap 19 (H) 5 - 15    Comment: Performed at [facility], [address] [place], [place] [address]  Lactic acid, plasma     Status: Abnormal   Collection Time: 11/07/18 10:05 AM  Result Value Ref Range   Lactic Acid, Venous 8.1 (HH) 0.5 - 1.9 mmol/L    Comment: CRITICAL RESULT CALLED TO, READ BACK BY AND VERIFIED WITH REBEKAH BAXTER AT 1038 11/07/2018.PMF Performed at [facility], [address] [place], [place] [address]   Blood culture (routine x 2)     Status: None (Preliminary result)   Collection Time: 11/07/18 10:05 AM   Specimen: BLOOD  Result Value Ref Range   Specimen Description BLOOD BLOOD RIGHT FOREARM    Special Requests      BOTTLES DRAWN AEROBIC AND ANAEROBIC Blood Culture adequate volume   Culture      NO GROWTH < 24 HOURS Performed at [facility], [address], [place], [place] [address]    Report Status PENDING   Blood culture (routine x 2)     Status: None (Preliminary result)   Collection Time: 11/07/18 10:05 AM   Specimen: BLOOD  Result Value Ref Range   Specimen Description BLOOD BLOOD LEFT FOREARM    Special Requests      BOTTLES DRAWN AEROBIC AND ANAEROBIC Blood Culture adequate volume   Culture  Setup Time      Organism ID to follow GRAM NEGATIVE RODS ANAEROBIC BOTTLE ONLY CRITICAL RESULT CALLED TO, READ BACK BY AND VERIFIED WITH: Due HALL  11/08/2018 TTG Performed at [facility], [address] [place], [place] [address]    Culture GRAM NEGATIVE RODS    Report Status PENDING   TSH     Status: None   Collection Time: 11/07/18 10:05 AM  Result Value Ref Range   TSH 1.447 0.350 - 4.500 uIU/mL    Comment: Performed by a 3rd Generation assay with a  functional sensitivity of <=0.01 uIU/mL. Performed at [facility], [address] [place], [place] [address]   T4, free     Status: None   Collection Time: 11/07/18 10:05 AM  Result Value Ref Range   Free T4 0.75 0.61 - 1.12 ng/dL    Comment: (NOTE) Biotin ingestion may interfere with free T4 tests. If the results are inconsistent with the TSH level, previous test results, or the clinical presentation, then consider biotin interference. If needed, order repeat testing after stopping biotin. Performed at [facility], [address] [place], [place] [address]   Hemoglobin A1c     Status: Abnormal   Collection Time: 11/07/18 10:05 AM  Result Value Ref Range   Hgb A1c MFr Bld 6.7 (H) 4.8 - 5.6 %    Comment: (NOTE) Pre diabetes:          5.7%-6.4% Diabetes:              >6.4% Glycemic control for   <7.0% adults with diabetes    Mean Plasma Glucose 145.59 mg/dL    Comment: Performed at [facility], [address]  [place], [place] [address]  Blood Culture ID Panel (Reflexed)     Status: Abnormal   Collection Time: 11/07/18 10:05 AM  Result Value Ref Range   Enterococcus species NOT DETECTED NOT DETECTED   Listeria monocytogenes NOT DETECTED NOT DETECTED   Staphylococcus species NOT DETECTED NOT DETECTED   Staphylococcus aureus (BCID) NOT DETECTED NOT DETECTED   Streptococcus species NOT DETECTED NOT DETECTED   Streptococcus agalactiae NOT DETECTED NOT DETECTED   Streptococcus pneumoniae NOT DETECTED NOT DETECTED   Streptococcus pyogenes NOT DETECTED NOT DETECTED   Acinetobacter baumannii NOT DETECTED NOT DETECTED   Enterobacteriaceae species DETECTED (A) NOT DETECTED    Comment: Enterobacteriaceae represent a large family of gram-negative bacteria, not a single organism. CRITICAL RESULT CALLED TO, READ BACK BY AND VERIFIED WITH: Olheiser HALL @506  11/08/2018 TTG    Enterobacter cloacae complex NOT DETECTED NOT DETECTED   Escherichia coli NOT DETECTED  NOT DETECTED   Klebsiella oxytoca NOT DETECTED NOT DETECTED   Klebsiella pneumoniae NOT DETECTED NOT DETECTED   Proteus species DETECTED (A) NOT DETECTED    Comment: CRITICAL RESULT CALLED TO, READ BACK BY AND VERIFIED WITH: Dehner HALL @506  11/08/2018 TTG    Serratia marcescens NOT DETECTED NOT DETECTED   Carbapenem resistance NOT DETECTED NOT DETECTED   Haemophilus influenzae NOT DETECTED NOT DETECTED   Neisseria meningitidis NOT DETECTED NOT DETECTED   Pseudomonas aeruginosa NOT DETECTED NOT DETECTED   Candida albicans NOT DETECTED NOT DETECTED   Candida glabrata NOT DETECTED NOT DETECTED   Candida krusei NOT DETECTED NOT DETECTED   Candida parapsilosis NOT DETECTED NOT DETECTED   Candida tropicalis NOT DETECTED NOT DETECTED    Comment: Performed at [facility], [address] [place], [place] [address]  SARS Coronavirus 2 by RT PCR (hospital order, performed in [facility] hospital lab) Nasopharyngeal Nasopharyngeal Swab     Status: None   Collection Time: 11/07/18 10:40 AM   Specimen: Nasopharyngeal Swab  Result Value Ref Range   SARS Coronavirus 2 NEGATIVE NEGATIVE    Comment: (NOTE) If result is NEGATIVE SARS-CoV-2 target nucleic acids are NOT DETECTED. The SARS-CoV-2 RNA is generally detectable in upper and lower  respiratory specimens during the acute phase of infection. The lowest  concentration of SARS-CoV-2 viral copies this assay can detect is 250  copies / mL. A negative result does not preclude SARS-CoV-2 infection  and should not be used as the sole basis for treatment or other  patient management decisions.  A negative result may occur with  improper specimen collection / handling, submission of specimen other  than nasopharyngeal swab, presence of viral mutation(s) within the  areas targeted by this assay, and inadequate number of viral copies  (<250 copies / mL). A negative result must be combined with clinical  observations, patient history, and  epidemiological information. If result is POSITIVE SARS-CoV-2 target nucleic acids are DETECTED. The SARS-CoV-2 RNA is generally detectable in upper and lower  respiratory specimens dur ing the acute phase of infection.  Positive  results are indicative of active infection with SARS-CoV-2.  Clinical  correlation with patient history and other diagnostic information is  necessary to determine patient infection status.  Positive results do  not rule out bacterial infection or co-infection with other viruses. If result is PRESUMPTIVE POSTIVE SARS-CoV-2 nucleic acids MAY BE PRESENT.   A presumptive positive result was obtained on the submitted specimen  and confirmed on repeat testing.  While 2019 novel coronavirus  (SARS-CoV-2) nucleic acids may be  present in the submitted sample  additional confirmatory testing may be necessary for epidemiological  and / or clinical management purposes  to differentiate between  SARS-CoV-2 and other Sarbecovirus currently known to infect humans.  If clinically indicated additional testing with an alternate test  methodology [phone]) is advised. The SARS-CoV-2 RNA is generally  detectable in upper and lower respiratory sp ecimens during the acute  phase of infection. The expected result is Negative. Fact Sheet for Patients:  BoilerBrush.com.cy Fact Sheet for Healthcare Providers: https://pope.com/ This test is not yet approved or cleared by the [place] FDA and has been authorized for detection and/or diagnosis of SARS-CoV-2 by FDA under an Emergency Use Authorization (EUA).  This EUA will remain in effect (meaning this test can be used) for the duration of the COVID-19 declaration under Section 564(b)(1) of the Act, 21 U.S.C. section 360bbb-3(b)(1), unless the authorization is terminated or revoked sooner. Performed at [facility], [address] [place], [place] [address]   CSF cell  count with differential collection tube #: 1     Status: Abnormal   Collection Time: 11/07/18 12:29 PM  Result Value Ref Range   Tube # 1    Color, CSF COLORLESS COLORLESS   Appearance, CSF CLEAR CLEAR   Supernatant CLEAR    RBC Count, CSF 113 (H) 0 - 3 /cu mm   WBC, CSF 4 0 - 5 /cu mm   Segmented Neutrophils-CSF 3 %   Lymphs, CSF 79 %   Monocyte-Macrophage-Spinal Fluid 18 %   Eosinophils, CSF 0 %    Comment: Performed at [facility], [address] [place], [place] [address]  CSF cell count with differential     Status: Abnormal   Collection Time: 11/07/18 12:29 PM  Result Value Ref Range   Tube # 1    Color, CSF COLORLESS COLORLESS   Appearance, CSF CLEAR CLEAR   Supernatant CLEAR    RBC Count, CSF 247 (H) 0 - 3 /cu mm   WBC, CSF 7 (H) 0 - 5 /cu mm   Segmented Neutrophils-CSF 5 %   Lymphs, CSF 73 %   Monocyte-Macrophage-Spinal Fluid 22 %   Eosinophils, CSF 0 %    Comment: Performed at [facility], [address] [place], [place] [address]  Lactic acid, plasma     Status: Abnormal   Collection Time: 11/07/18 12:47 PM  Result Value Ref Range   Lactic Acid, Venous 2.3 (HH) 0.5 - 1.9 mmol/L    Comment: CRITICAL RESULT CALLED TO, READ BACK BY AND VERIFIED WITH REBEKAH BAXTER AT 1316 11/07/2018.PMF Performed at [facility], [address] [place], [place] [address]   CSF culture     Status: None (Preliminary result)   Collection Time: 11/07/18 12:50 PM   Specimen: CSF; Cerebrospinal Fluid  Result Value Ref Range   Specimen Description CSF    Special Requests NONE    Gram Stain      WBC SEEN RED BLOOD CELLS NO ORGANISMS SEEN Performed at [facility], [address] [place], [place] [address]    Culture PENDING    Report Status PENDING   Glucose, CSF     Status: Abnormal   Collection Time: 11/07/18  1:10 PM  Result Value Ref Range   Glucose, CSF 103 (H) 40 - 70 mg/dL    Comment: Performed at [facility], [address], [place], [place] [address]  Protein, CSF     Status: None  Collection Time: 11/07/18  1:10 PM  Result Value Ref Range   Total  Protein, CSF 33 15 - 45 mg/dL    Comment: Performed at [facility], [address] [place], [place] [address]  Culture, fungus without smear     Status: None (Preliminary result)   Collection Time: 11/07/18  1:10 PM   Specimen: PATH Cytology CSF; Cerebrospinal Fluid  Result Value Ref Range   Specimen Description      CSF Performed at [facility], [address] [place], [place] [address]    Special Requests      NONE Performed at [facility], [address], [place], [place] [address]    Culture      NO FUNGUS ISOLATED AFTER 1 DAY Performed at [facility], [address], [place], [place] [address]    Report Status PENDING   Glucose, capillary     Status: Abnormal   Collection Time: 11/07/18  6:19 PM  Result Value Ref Range   Glucose-Capillary 226 (H) 70 - 99 mg/dL   Comment 1 Notify RN   Procalcitonin - Baseline     Status: None   Collection Time: 11/07/18  7:35 PM  Result Value Ref Range   Procalcitonin 16.93 ng/mL    Comment:        Interpretation: PCT >= 10 ng/mL: Important systemic inflammatory response, almost exclusively due to severe bacterial sepsis or septic shock. (NOTE)       Sepsis PCT Algorithm           Lower Respiratory Tract                                      Infection PCT Algorithm    ----------------------------     ----------------------------         PCT < 0.25 ng/mL                PCT < 0.10 ng/mL         Strongly encourage             Strongly discourage   discontinuation of antibiotics    initiation of antibiotics    ----------------------------     -----------------------------       PCT 0.25 - 0.50 ng/mL            PCT 0.10 - 0.25 ng/mL               OR       >80% decrease in PCT            Discourage initiation of                                             antibiotics      Encourage discontinuation           of antibiotics    ----------------------------     -----------------------------         PCT >= 0.50 ng/mL              PCT 0.26 - 0.50 ng/mL                AND       <80% decrease in PCT  Encourage initiation of                                             antibiotics       Encourage continuation           of antibiotics    ----------------------------     -----------------------------        PCT >= 0.50 ng/mL                  PCT > 0.50 ng/mL               AND         increase in PCT                  Strongly encourage                                      initiation of antibiotics    Strongly encourage escalation           of antibiotics                                     -----------------------------                                           PCT <= 0.25 ng/mL                                                 OR                                        > 80% decrease in PCT                                     Discontinue / Do not initiate                                             antibiotics Performed at [facility], [address] [place], [place] [address]   Glucose, capillary     Status: Abnormal   Collection Time: 11/07/18  9:56 PM  Result Value Ref Range   Glucose-Capillary 239 (H) 70 - 99 mg/dL  Basic metabolic panel     Status: Abnormal   Collection Time: 11/08/18  4:27 AM  Result Value Ref Range   Sodium 136 135 - 145 mmol/L   Potassium 3.8 3.5 - 5.1 mmol/L   Chloride 105 98 - 111 mmol/L   CO2 24 22 - 32 mmol/L   Glucose, Bld 193 (H) 70 - 99 mg/dL   BUN 23 8 - 23 mg/dL   Creatinine, Ser 5.28 (H) 0.61 - 1.24  mg/dL   Calcium 8.3 (L) 8.9 - 10.3 mg/dL   GFR calc non Af Amer 53 (L) >60 mL/min   GFR calc Af Amer >60 >60 mL/min   Anion gap 7 5 - 15    Comment: Performed at [facility], [address] [place], [place] [address]  CBC     Status: Abnormal   Collection Time: 11/08/18   4:27 AM  Result Value Ref Range   WBC 10.1 4.0 - 10.5 K/uL   RBC 4.46 4.22 - 5.81 MIL/uL   Hemoglobin 12.1 (L) 13.0 - 17.0 g/dL   HCT 60.4 (L) 54.0 - 98.1 %   MCV 83.2 80.0 - 100.0 fL   MCH 27.1 26.0 - 34.0 pg   MCHC 32.6 30.0 - 36.0 g/dL   RDW 19.1 47.8 - 29.5 %   Platelets 96 (L) 150 - 400 K/uL    Comment: Immature Platelet Fraction may be clinically indicated, consider ordering this additional test [id number]    nRBC 0.0 0.0 - 0.2 %    Comment: Performed at [facility], [address] [place], [place] [address]  Magnesium     Status: None   Collection Time: 11/08/18  4:27 AM  Result Value Ref Range   Magnesium 2.1 1.7 - 2.4 mg/dL    Comment: Performed at [facility], [address] [place], [place] [address]  TSH     Status: None   Collection Time: 11/08/18  4:27 AM  Result Value Ref Range   TSH 0.522 0.350 - 4.500 uIU/mL    Comment: Performed by a 3rd Generation assay with a functional sensitivity of <=0.01 uIU/mL. Performed at [facility], [address] [place], [place] [address]   Procalcitonin     Status: None   Collection Time: 11/08/18  4:27 AM  Result Value Ref Range   Procalcitonin 16.53 ng/mL    Comment:        Interpretation: PCT >= 10 ng/mL: Important systemic inflammatory response, almost exclusively due to severe bacterial sepsis or septic shock. (NOTE)       Sepsis PCT Algorithm           Lower Respiratory Tract                                      Infection PCT Algorithm    ----------------------------     ----------------------------         PCT < 0.25 ng/mL                PCT < 0.10 ng/mL         Strongly encourage             Strongly discourage   discontinuation of antibiotics    initiation of antibiotics    ----------------------------     -----------------------------       PCT 0.25 - 0.50 ng/mL            PCT 0.10 - 0.25 ng/mL               OR       >80% decrease in PCT            Discourage initiation of  antibiotics      Encourage discontinuation           of antibiotics    ----------------------------     -----------------------------         PCT >= 0.50 ng/mL              PCT 0.26 - 0.50 ng/mL                AND       <80% decrease in PCT             Encourage initiation of                                             antibiotics       Encourage continuation           of antibiotics    ----------------------------     -----------------------------        PCT >= 0.50 ng/mL                  PCT > 0.50 ng/mL               AND         increase in PCT                  Strongly encourage                                      initiation of antibiotics    Strongly encourage escalation           of antibiotics                                     -----------------------------                                           PCT <= 0.25 ng/mL                                                 OR                                        > 80% decrease in PCT                                     Discontinue / Do not initiate                                             antibiotics Performed at [facility], [address] [place], [place] [address]   Glucose, capillary     Status: Abnormal   Collection Time: 11/08/18  7:58  AM  Result Value Ref Range   Glucose-Capillary 114 (H) 70 - 99 mg/dL  Blood gas, arterial     Status: Abnormal   Collection Time: 11/08/18 10:45 AM  Result Value Ref Range   FIO2 0.32    Delivery systems NASAL CANNULA    pH, Arterial 7.46 (H) 7.350 - 7.450   pCO2 arterial 30 (L) 32.0 - 48.0 mmHg   pO2, Arterial 93 83.0 - 108.0 mmHg   Bicarbonate 21.3 20.0 - 28.0 mmol/L   Acid-base deficit 1.5 0.0 - 2.0 mmol/L   O2 Saturation 97.6 %   Patient temperature 37.0    Collection site LEFT RADIAL    Sample type ARTERIAL DRAW    Allens test (pass/fail) POSITIVE (A) PASS    Comment: Performed at [facility], [address] [place], [place] [address]  Troponin I (High Sensitivity)     Status: Abnormal   Collection Time: 11/08/18 10:47 AM  Result Value Ref Range   Troponin I (High Sensitivity) 112 (HH) <18 ng/L    Comment: CRITICAL RESULT CALLED TO, READ BACK BY AND VERIFIED WITH YAKANA CROSS ON 11/08/2018 AT 1221 TIK (NOTE) Elevated high sensitivity troponin I (hsTnI) values and significant  changes across serial measurements may suggest ACS but many other  chronic and acute conditions are known to elevate hsTnI results.  Refer to the "Links" section for chest pain algorithms and additional  guidance. Performed at [facility], [address] [place], [place] [address]   Lactic acid, plasma     Status: Abnormal   Collection Time: 11/08/18 11:03 AM  Result Value Ref Range   Lactic Acid, Venous 2.8 (HH) 0.5 - 1.9 mmol/L    Comment: CRITICAL VALUE NOTED. VALUE IS CONSISTENT WITH PREVIOUSLY REPORTED/CALLED VALUE TIK Performed at [facility], [address] [place], [place] [address]   Glucose, capillary     Status: Abnormal   Collection Time: 11/08/18 12:32 PM  Result Value Ref Range   Glucose-Capillary 143 (H) 70 - 99 mg/dL  Troponin I (High Sensitivity)     Status: Abnormal   Collection Time: 11/08/18 12:38 PM  Result Value Ref Range   Troponin I (High Sensitivity) 268 (HH) <18 ng/L    Comment: CRITICAL VALUE NOTED. VALUE IS CONSISTENT WITH PREVIOUSLY REPORTED/CALLED VALUE TIK (NOTE) Elevated high sensitivity troponin I (hsTnI) values and significant  changes across serial measurements may suggest ACS but many other  chronic and acute conditions are known to elevate hsTnI results.  Refer to the "Links" section for chest pain algorithms and additional  guidance. Performed at [facility], [address], [place], [place] [address]     Ct Chest Wo Contrast  Result Date: 11/07/2018 CLINICAL DATA:  Fever of unknown origin. EXAM: CT CHEST, ABDOMEN AND PELVIS  WITHOUT CONTRAST TECHNIQUE: Multidetector CT imaging of the chest, abdomen and pelvis was performed following the standard protocol without IV contrast. COMPARISON:  11/14/2017 CT abdomen and pelvis. FINDINGS: CT CHEST FINDINGS Cardiovascular: Heart is normal size. Aorta is normal caliber. Scattered aortic and coronary artery calcifications. Mediastinum/Nodes: No mediastinal, hilar, or axillary adenopathy. Trachea and esophagus are unremarkable. Thyroid unremarkable. Lungs/Pleura: Lungs are clear. No focal airspace opacities or suspicious nodules. No effusions. Musculoskeletal: Chest wall soft tissues are unremarkable. No acute bony abnormality. CT ABDOMEN PELVIS FINDINGS Hepatobiliary: Diffuse low-density throughout the liver compatible with fatty infiltration. No focal abnormality. Gallbladder unremarkable. Pancreas: No focal abnormality or ductal dilatation. Spleen: No focal abnormality.  Normal size. Adrenals/Urinary  Tract: Exophytic cyst off the lower pole of the left kidney measures up to 4.5 cm, stable. Exophytic cyst anteriorly off the lower pole of the left kidney also stable. No renal or ureteral stones. No hydronephrosis. Adrenal glands and urinary bladder unremarkable. Stomach/Bowel: Few scattered colonic diverticula. Stomach, large and small bowel grossly unremarkable. Vascular/Lymphatic: Aortic atherosclerosis. Prominent left inguinal lymph node has a short axis diameter of 14 mm. No other enlarged abdominal or pelvic lymph nodes. Reproductive: No visible focal abnormality. Other: No free fluid or free air. Musculoskeletal: No acute bony abnormality. IMPRESSION: No acute cardiopulmonary disease. Aortic atherosclerosis, coronary artery disease. Diffuse fatty infiltration of the liver. No renal or ureteral stones. No hydronephrosis. Stable left renal cysts. Prominent left inguinal lymph node with a short axis diameter of 1.4 cm. This could be followed clinically to ensure stability. Electronically Signed    By: Charlett NoseKevin  Dover M.D.   On: 11/07/2018 10:38   Ct Angio Chest Pe W Or Wo Contrast  Result Date: 11/08/2018 CLINICAL DATA:  Increased shortness of breath EXAM: CT ANGIOGRAPHY CHEST WITH CONTRAST TECHNIQUE: Multidetector CT imaging of the chest was performed using the standard protocol during bolus administration of intravenous contrast. Multiplanar CT image reconstructions and MIPs were obtained to evaluate the vascular anatomy. CONTRAST:  150mL OMNIPAQUE IOHEXOL 350 MG/ML SOLN COMPARISON:  CT chest dated 11/07/2018 FINDINGS: Cardiovascular: Initial contrast bolus demonstrates suboptimal contrast opacification, specifically in the bilateral lower lobes. Subsequent contrast bolus demonstrates improved contrast opacification to the lobar level. However, evaluation of the bilateral pulmonary arteries is constrained by respiratory motion, leading to artifact, involving the lung apices and bilateral lower lobes. Within that constraint, there is no evidence of pulmonary embolism. The heart is normal in size. No pericardial effusion. No evidence thoracic aortic aneurysm. Atherosclerotic calcifications of the aortic arch. Mild coronary atherosclerosis of the LAD. Mediastinum/Nodes: No suspicious mediastinal lymphadenopathy. 8 mm short axis subcarinal node. Visualized left thyroid is unremarkable. Right thyroid is not visualized, presumably surgically absent. Lungs/Pleura: Evaluation of the lung parenchyma is constrained by respiratory motion. Within that constraint, there no suspicious pulmonary nodules. Scattered areas of ground-glass opacity likely reflect expiratory imaging. This appearance is not considered suspicious for interstitial edema. No focal consolidation. No pleural effusion or pneumothorax. Upper Abdomen: Visualized upper abdomen is grossly unremarkable. Musculoskeletal: Degenerative changes of the visualized thoracolumbar spine. Review of the MIP images confirms the above findings. IMPRESSION: Limited  evaluation, as above. No evidence of pulmonary embolism. When accounting for respiratory motion, there is no interval change from recent CT. Aortic Atherosclerosis (ICD10-I70.0). Electronically Signed   By: Charline BillsSriyesh  Krishnan M.D.   On: 11/08/2018 12:10   Dg Chest Portable 1 View  Result Date: 11/07/2018 CLINICAL DATA:  Fever EXAM: PORTABLE CHEST 1 VIEW COMPARISON:  04/03/2006 FINDINGS: The heart size and mediastinal contours are within normal limits. Both lungs are clear. The visualized skeletal structures are unremarkable. IMPRESSION: No acute abnormality of the lungs in AP portable projection. Electronically Signed   By: Lauralyn PrimesAlex  Bibbey M.D.   On: 11/07/2018 10:52   Dg Foot Complete Left  Result Date: 11/08/2018 CLINICAL DATA:  63 year old male with history of ulcer on the left foot. EXAM: LEFT FOOT - COMPLETE 3+ VIEW COMPARISON:  No priors. FINDINGS: There is no evidence of fracture or dislocation. There is no evidence of inflammatory arthropathy or other focal bone abnormality. Soft tissues are unremarkable. IMPRESSION: Negative. Electronically Signed   By: Trudie Reedaniel  Entrikin M.D.   On: 11/08/2018 11:43   Ct Renal  Stone Study  Result Date: 11/07/2018 CLINICAL DATA:  Fever of unknown origin. EXAM: CT CHEST, ABDOMEN AND PELVIS WITHOUT CONTRAST TECHNIQUE: Multidetector CT imaging of the chest, abdomen and pelvis was performed following the standard protocol without IV contrast. COMPARISON:  11/14/2017 CT abdomen and pelvis. FINDINGS: CT CHEST FINDINGS Cardiovascular: Heart is normal size. Aorta is normal caliber. Scattered aortic and coronary artery calcifications. Mediastinum/Nodes: No mediastinal, hilar, or axillary adenopathy. Trachea and esophagus are unremarkable. Thyroid unremarkable. Lungs/Pleura: Lungs are clear. No focal airspace opacities or suspicious nodules. No effusions. Musculoskeletal: Chest wall soft tissues are unremarkable. No acute bony abnormality. CT ABDOMEN PELVIS FINDINGS  Hepatobiliary: Diffuse low-density throughout the liver compatible with fatty infiltration. No focal abnormality. Gallbladder unremarkable. Pancreas: No focal abnormality or ductal dilatation. Spleen: No focal abnormality.  Normal size. Adrenals/Urinary Tract: Exophytic cyst off the lower pole of the left kidney measures up to 4.5 cm, stable. Exophytic cyst anteriorly off the lower pole of the left kidney also stable. No renal or ureteral stones. No hydronephrosis. Adrenal glands and urinary bladder unremarkable. Stomach/Bowel: Few scattered colonic diverticula. Stomach, large and small bowel grossly unremarkable. Vascular/Lymphatic: Aortic atherosclerosis. Prominent left inguinal lymph node has a short axis diameter of 14 mm. No other enlarged abdominal or pelvic lymph nodes. Reproductive: No visible focal abnormality. Other: No free fluid or free air. Musculoskeletal: No acute bony abnormality. IMPRESSION: No acute cardiopulmonary disease. Aortic atherosclerosis, coronary artery disease. Diffuse fatty infiltration of the liver. No renal or ureteral stones. No hydronephrosis. Stable left renal cysts. Prominent left inguinal lymph node with a short axis diameter of 1.4 cm. This could be followed clinically to ensure stability. Electronically Signed   By: Charlett Nose M.D.   On: 11/07/2018 10:38   Ct Head Code Stroke Wo Contrast  Result Date: 11/07/2018 CLINICAL DATA:  Code stroke.  Speech difficulty.  Possible stroke. EXAM: CT HEAD WITHOUT CONTRAST TECHNIQUE: Contiguous axial images were obtained from the base of the skull through the vertex without intravenous contrast. COMPARISON:  Brain MRI 11/17/2012 FINDINGS: Brain: No evidence of acute infarction, hemorrhage, hydrocephalus, extra-axial collection or mass lesion/mass effect. Vascular: No hyperdense vessel or unexpected calcification. Skull: Normal. Negative for fracture or focal lesion. Sinuses/Orbits: No acute finding. Other: These results were called by  telephone at the time of interpretation on 11/07/2018 at 10:32 am to provider [facility] , who verbally acknowledged these results. ASPECTS [facility] Stroke Program Early CT Score) - Ganglionic level infarction (caudate, lentiform nuclei, internal capsule, insula, M1-M3 cortex): 7 - Supraganglionic infarction (M4-M6 cortex): 3 Total score (0-10 with 10 being normal): 10 IMPRESSION: Negative head CT. ASPECTS is 10. Electronically Signed   By: Marnee Spring M.D.   On: 11/07/2018 10:35   Dg Lumbar Puncture Fluoro Guide  Result Date: 11/07/2018 CLINICAL DATA:  Meningitis.  Fever and stroke symptoms. EXAM: DIAGNOSTIC LUMBAR PUNCTURE UNDER FLUOROSCOPIC GUIDANCE FLUOROSCOPY TIME:  Fluoroscopy Time:  1 minutes 42 seconds Radiation Exposure Index (if provided by the fluoroscopic device): 72.3 mGy Number of Acquired Spot Images: 1 PROCEDURE: After discussing the risks and benefits of this procedure with the patient informed consent was obtained. The back was sterilely prepped and draped. Following local anesthesia with 1% lidocaine a 22 gauge spinal needle was advanced into the lumbar spine thecal sac at the L4-L5 level under fluoroscopic guidance . 6 cc of CSF obtained. Initial CSF was slightly blood-tinged, this cleared. CSF was sent to the lab for further evaluation. Hemostasis achieved following needle removal. There no complications. IMPRESSION:  Successful fluoroscopically directed lumbar puncture. CSF samples sent to the laboratory for further evaluation. Electronically Signed   By: Maisie Fus  Register   On: 11/07/2018 13:39    Review of Systems  Constitutional: Positive for chills. Negative for fever.  HENT: Negative for congestion and sore throat.   Eyes: Negative for blurred vision and double vision.  Respiratory: Positive for shortness of breath. Negative for cough.   Cardiovascular: Negative for chest pain and palpitations.  Gastrointestinal: Negative for nausea and vomiting.  Genitourinary: Negative  for dysuria.  Musculoskeletal:       Some recent increased pain in the left forefoot.  Skin:       Patient relates recent swelling and redness in his left foot.  Denies any drainage  Neurological: Negative for tingling and sensory change.  Endo/Heme/Allergies: Does not bruise/bleed easily.  Psychiatric/Behavioral: Negative for depression.   Blood pressure 132/87, pulse 100, temperature 98.2 F (36.8 C), temperature source Oral, resp. rate (!) 22, height 6' (1.829 m), weight 120.7 kg, SpO2 99 %. Physical Exam  Cardiovascular:  DP and PT pulses are fully palpable bilateral.  Musculoskeletal:     Comments: Adequate range of motion of the pedal joints.  Muscle testing deferred.  Some pain on palpation around the left forefoot at the first and second metatarsal phalangeal joint area  Neurological:  Loss of protective threshold with a monofilament wire in the toes bilateral and plantar forefoot.  Proprioception still appears to be intact.  Skin:  The skin is warm dry and supple.  There is some significant erythema and edema in the left forefoot with what appears to be an intact possible previous puncture wound or ulceration.  Post debridement there is a full-thickness ulcerative area with moderate purulent discharge expressed from deep towards the first interspace.  Superficial granular ulcerative area approximately 1.5 cm diameter with the full-thickness area approximately 2 mm diameter.    Assessment/Plan: Assessment: 1.  Ulceration left foot with abscess first interspace. 2.  Diabetes with associated neuropathy. 3.  Cellulitis left forefoot  Plan: Excisional debridement of devitalized tissue from the ulcerative area sharply using a 15 blade and tissue nippers.  Culture was obtained of the purulent discharge.  Saline wet-to-dry dressing applied to the left foot.  We will obtain an MRI for evaluation of deeper infection as well as for evaluation of any bone or joint involvement.  Discussed with  the patient that pending the results of his MRI he may require an I&D of the left foot.  Follow-up later this evening for evaluation of the MRI.  Ricci Barker 11/08/2018, 2:15 PM

## 2018-11-08 NOTE — Consult Note (Signed)
Pulmonary Medicine          Date: 11/08/2018,   MRN# [id number] Jacob Lawson 11/17/1955     AdmissionWeight: 120.7 kg                 CurrentWeight: 120.7 kg      CHIEF COMPLAINT:   Acute respiratory distress   HISTORY OF PRESENT ILLNESS   This is a pleasant 63 year old male with a history of migraine headaches, sleep apnea, GERD, fatty liver, dyslipidemia, diabetes type 2, allergic rhinitis, occipital neuralgia, CKD stage II, history of colon CA who was initially admitted to the ED with left-sided weakness in the morning.  This was accompanied with aphasia.  Reports previous to admission he was feeling malaise and reports febrile episode objectively measured T-max 101.  As per wife patient was showing signs of disequilibrium.  Patient had code stroke evaluation with neurologist TPA was not indicated, he did have full evaluation with brain imaging pending, COVID-19 testing was done and is negative.  Since admission he has been on room air with episodes of mild hypoxemia nocturnally, as well as sinus tachycardia, he had chest x-ray done yesterday which was essentially unremarkable, CT chest independently reviewed by me and is without pulmonary edema,Effusions,Bronchial debris,Masses,Pneumothorax,Appreciable consolidation,This study was done without contrast and therefore lymphadenopathy as well as pulmonary embolism was unable to be appreciated.  Blood cultures show Proteus bacteremia positive. Patient did have a lumbar puncture done which was essentially normal.  PAST MEDICAL HISTORY   Past Medical History:  Diagnosis Date   Carpal tunnel syndrome on both sides    Depression    Diabetes mellitus without complication (HCC)    GERD (gastroesophageal reflux disease)    Hyperlipidemia    Hypertension    Hypothyroidism    Sleep apnea    CPAP   Wears hearing aid in both ears      SURGICAL HISTORY   Past Surgical History:  Procedure Laterality Date    APPENDECTOMY     CARPAL TUNNEL RELEASE Right 11/2003   CARPAL TUNNEL RELEASE Left 11/19/2017   Procedure: LEFT OPEN CARPAL TUNNEL RELEASE;  Surgeon: Erin SonsKernodle, Harold, MD;  [facility];  Service: Orthopedics;  Laterality: Left;  Diabetic - oral meds Sleep Apnea   COLONOSCOPY     2/91, 6/00, 01/04, 7/08, 10/10, 12/13, 02/26/17   ESOPHAGOGASTRODUODENOSCOPY     2/91, 1/95, 6/00, 1/04, 7/08, 10/10   EXCISION NEUROMA Left 06/2007   foot   FINGER SURGERY     for congenital webbing   MANDIBLE SURGERY     in late 30s, fo overbite   PROSTATE BIOPSY  12/2015   ROTATOR CUFF REPAIR Left 02/2005   THYROIDECTOMY, PARTIAL     TONSILLECTOMY     and adenoidectomy     FAMILY HISTORY   History reviewed. No pertinent family history.   SOCIAL HISTORY   Social History   Tobacco Use   Smoking status: Never Smoker   Smokeless tobacco: Never Used  Substance Use Topics   Alcohol use: Yes    Comment: occasional beer   Drug use: No     MEDICATIONS    Home Medication:    Current Medication:  Current Facility-Administered Medications:    0.9 %  sodium chloride infusion, , Intravenous, Continuous, Ojie, Jude, MD, Last Rate: 100 mL/hr at 11/08/18 0500   acetaminophen (TYLENOL) tablet 650 mg, 650 mg, Oral, Q6H PRN, Auburn BilberryPatel, Shreyang, MD   aspirin tablet 325 mg, 325 mg,  Oral, Daily, Ojie, Jude, MD, 325 mg at 11/08/18 0846   cefTRIAXone (ROCEPHIN) 2 g in sodium chloride 0.9 % 100 mL IVPB, 2 g, Intravenous, Q24H, Ravishankar, Jayashree, MD   cholecalciferol (VITAMIN D) tablet 2,000 Units, 2,000 Units, Oral, Daily, Ojie, Jude, MD, 2,000 Units at 11/08/18 0846   enoxaparin (LOVENOX) injection 40 mg, 40 mg, Subcutaneous, Q24H, Ojie, Jude, MD, 40 mg at 11/08/18 0846   finasteride (PROSCAR) tablet 5 mg, 5 mg, Oral, QPM, Ojie, Jude, MD, 5 mg at 11/07/18 1820   fluticasone (FLONASE) 50 MCG/ACT nasal spray 1-2 spray, 1-2 spray, Each Nare, Daily PRN, Ojie, Jude, MD    insulin aspart (novoLOG) injection 0-5 Units, 0-5 Units, Subcutaneous, QHS, Ojie, Jude, MD, 2 Units at 11/07/18 2213   insulin aspart (novoLOG) injection 0-9 Units, 0-9 Units, Subcutaneous, TID WC, Ojie, Jude, MD, 3 Units at 11/07/18 1828   levalbuterol (XOPENEX) nebulizer solution 1.25 mg, 1.25 mg, Nebulization, Q6H, Auburn Bilberry, MD   levothyroxine (SYNTHROID) tablet 125 mcg, 125 mcg, Oral, QAC breakfast, Ojie, Jude, MD, 125 mcg at 11/08/18 4098   liothyronine (CYTOMEL) tablet 12.5 mcg, 12.5 mcg, Oral, Daily, Ojie, Jude, MD, 12.5 mcg at 11/08/18 0845   loratadine (CLARITIN) tablet 10 mg, 10 mg, Oral, Daily PRN, Enid Baas, Jude, MD   LORazepam (ATIVAN) injection 1 mg, 1 mg, Intravenous, Once, Auburn Bilberry, MD   metoprolol tartrate (LOPRESSOR) injection 5 mg, 5 mg, Intravenous, Once, Auburn Bilberry, MD   multivitamin with minerals tablet 1 tablet, 1 tablet, Oral, Daily, Ojie, Jude, MD, 1 tablet at 11/08/18 0846   pantoprazole (PROTONIX) EC tablet 40 mg, 40 mg, Oral, Daily, Ojie, Jude, MD, 40 mg at 11/08/18 0845   sertraline (ZOLOFT) tablet 50 mg, 50 mg, Oral, QPM, Ojie, Jude, MD, 50 mg at 11/07/18 1820   sodium chloride flush (NS) 0.9 % injection 3 mL, 3 mL, Intravenous, Once, Concha Se, MD   vitamin B-12 (CYANOCOBALAMIN) tablet 500 mcg, 500 mcg, Oral, Daily, Ojie, Jude, MD, 500 mcg at 11/08/18 1191    ALLERGIES   Lamictal [lamotrigine] and Statins     REVIEW OF SYSTEMS    Review of Systems:  Gen:  Denies  fever, sweats, chills weigh loss  HEENT: Denies blurred vision, double vision, ear pain, eye pain, hearing loss, nose bleeds, sore throat Cardiac:  No dizziness, chest pain or heaviness, chest tightness,edema Resp:   Denies cough or sputum porduction, shortness of breath,wheezing, hemoptysis,  Gi: Denies swallowing difficulty, stomach pain, nausea or vomiting, diarrhea, constipation, bowel incontinence Gu:  Denies bladder incontinence, burning urine Ext:   Denies  Joint pain, stiffness or swelling Skin: Denies  skin rash, easy bruising or bleeding or hives Endoc:  Denies polyuria, polydipsia , polyphagia or weight change Psych:   Denies depression, insomnia or hallucinations   Other:  All other systems negative   VS: BP 132/87 (BP Location: Right Arm)    Pulse 97    Temp 98.2 F (36.8 C) (Oral)    Resp 18    Ht 6' (1.829 m)    Wt 120.7 kg    SpO2 90% Comment: 1 min recovery after ambulation.  Desat to 88% while walking.   BMI 36.09 kg/m      PHYSICAL EXAM    GENERAL:NAD, no fevers, chills, no weakness no fatigue HEAD: Normocephalic, atraumatic.  EYES: Pupils equal, round, reactive to light. Extraocular muscles intact. No scleral icterus.  MOUTH: Moist mucosal membrane. Dentition intact. No abscess noted.  EAR, NOSE, THROAT: Clear without  exudates. No external lesions.  NECK: Supple. No thyromegaly. No nodules. No JVD.  PULMONARY: Diffuse coarse rhonchi right sided +wheezes CARDIOVASCULAR: S1 and S2. Regular rate and rhythm. No murmurs, rubs, or gallops. No edema. Pedal pulses 2+ bilaterally.  GASTROINTESTINAL: Soft, nontender, nondistended. No masses. Positive bowel sounds. No hepatosplenomegaly.  MUSCULOSKELETAL: No swelling, clubbing, or edema. Range of motion full in all extremities.  NEUROLOGIC: Cranial nerves II through XII are intact. No gross focal neurological deficits. Sensation intact. Reflexes intact.  SKIN: No ulceration, lesions, rashes, or cyanosis. Skin warm and dry. Turgor intact.  PSYCHIATRIC: Mood, affect within normal limits. The patient is awake, alert and oriented x 3. Insight, judgment intact.       IMAGING    Ct Chest Wo Contrast  Result Date: 11/07/2018 CLINICAL DATA:  Fever of unknown origin. EXAM: CT CHEST, ABDOMEN AND PELVIS WITHOUT CONTRAST TECHNIQUE: Multidetector CT imaging of the chest, abdomen and pelvis was performed following the standard protocol without IV contrast. COMPARISON:  11/14/2017 CT abdomen  and pelvis. FINDINGS: CT CHEST FINDINGS Cardiovascular: Heart is normal size. Aorta is normal caliber. Scattered aortic and coronary artery calcifications. Mediastinum/Nodes: No mediastinal, hilar, or axillary adenopathy. Trachea and esophagus are unremarkable. Thyroid unremarkable. Lungs/Pleura: Lungs are clear. No focal airspace opacities or suspicious nodules. No effusions. Musculoskeletal: Chest wall soft tissues are unremarkable. No acute bony abnormality. CT ABDOMEN PELVIS FINDINGS Hepatobiliary: Diffuse low-density throughout the liver compatible with fatty infiltration. No focal abnormality. Gallbladder unremarkable. Pancreas: No focal abnormality or ductal dilatation. Spleen: No focal abnormality.  Normal size. Adrenals/Urinary Tract: Exophytic cyst off the lower pole of the left kidney measures up to 4.5 cm, stable. Exophytic cyst anteriorly off the lower pole of the left kidney also stable. No renal or ureteral stones. No hydronephrosis. Adrenal glands and urinary bladder unremarkable. Stomach/Bowel: Few scattered colonic diverticula. Stomach, large and small bowel grossly unremarkable. Vascular/Lymphatic: Aortic atherosclerosis. Prominent left inguinal lymph node has a short axis diameter of 14 mm. No other enlarged abdominal or pelvic lymph nodes. Reproductive: No visible focal abnormality. Other: No free fluid or free air. Musculoskeletal: No acute bony abnormality. IMPRESSION: No acute cardiopulmonary disease. Aortic atherosclerosis, coronary artery disease. Diffuse fatty infiltration of the liver. No renal or ureteral stones. No hydronephrosis. Stable left renal cysts. Prominent left inguinal lymph node with a short axis diameter of 1.4 cm. This could be followed clinically to ensure stability. Electronically Signed   By: Rolm Baptise M.D.   On: 11/07/2018 10:38   Dg Chest Portable 1 View  Result Date: 11/07/2018 CLINICAL DATA:  Fever EXAM: PORTABLE CHEST 1 VIEW COMPARISON:  04/03/2006 FINDINGS:  The heart size and mediastinal contours are within normal limits. Both lungs are clear. The visualized skeletal structures are unremarkable. IMPRESSION: No acute abnormality of the lungs in AP portable projection. Electronically Signed   By: Eddie Candle M.D.   On: 11/07/2018 10:52   Ct Renal Stone Study  Result Date: 11/07/2018 CLINICAL DATA:  Fever of unknown origin. EXAM: CT CHEST, ABDOMEN AND PELVIS WITHOUT CONTRAST TECHNIQUE: Multidetector CT imaging of the chest, abdomen and pelvis was performed following the standard protocol without IV contrast. COMPARISON:  11/14/2017 CT abdomen and pelvis. FINDINGS: CT CHEST FINDINGS Cardiovascular: Heart is normal size. Aorta is normal caliber. Scattered aortic and coronary artery calcifications. Mediastinum/Nodes: No mediastinal, hilar, or axillary adenopathy. Trachea and esophagus are unremarkable. Thyroid unremarkable. Lungs/Pleura: Lungs are clear. No focal airspace opacities or suspicious nodules. No effusions. Musculoskeletal: Chest wall soft tissues are unremarkable.  No acute bony abnormality. CT ABDOMEN PELVIS FINDINGS Hepatobiliary: Diffuse low-density throughout the liver compatible with fatty infiltration. No focal abnormality. Gallbladder unremarkable. Pancreas: No focal abnormality or ductal dilatation. Spleen: No focal abnormality.  Normal size. Adrenals/Urinary Tract: Exophytic cyst off the lower pole of the left kidney measures up to 4.5 cm, stable. Exophytic cyst anteriorly off the lower pole of the left kidney also stable. No renal or ureteral stones. No hydronephrosis. Adrenal glands and urinary bladder unremarkable. Stomach/Bowel: Few scattered colonic diverticula. Stomach, large and small bowel grossly unremarkable. Vascular/Lymphatic: Aortic atherosclerosis. Prominent left inguinal lymph node has a short axis diameter of 14 mm. No other enlarged abdominal or pelvic lymph nodes. Reproductive: No visible focal abnormality. Other: No free fluid or  free air. Musculoskeletal: No acute bony abnormality. IMPRESSION: No acute cardiopulmonary disease. Aortic atherosclerosis, coronary artery disease. Diffuse fatty infiltration of the liver. No renal or ureteral stones. No hydronephrosis. Stable left renal cysts. Prominent left inguinal lymph node with a short axis diameter of 1.4 cm. This could be followed clinically to ensure stability. Electronically Signed   By: Charlett Nose M.D.   On: 11/07/2018 10:38   Ct Head Code Stroke Wo Contrast  Result Date: 11/07/2018 CLINICAL DATA:  Code stroke.  Speech difficulty.  Possible stroke. EXAM: CT HEAD WITHOUT CONTRAST TECHNIQUE: Contiguous axial images were obtained from the base of the skull through the vertex without intravenous contrast. COMPARISON:  Brain MRI 11/17/2012 FINDINGS: Brain: No evidence of acute infarction, hemorrhage, hydrocephalus, extra-axial collection or mass lesion/mass effect. Vascular: No hyperdense vessel or unexpected calcification. Skull: Normal. Negative for fracture or focal lesion. Sinuses/Orbits: No acute finding. Other: These results were called by telephone at the time of interpretation on 11/07/2018 at 10:32 am to provider [facility] , who verbally acknowledged these results. ASPECTS [facility] Stroke Program Early CT Score) - Ganglionic level infarction (caudate, lentiform nuclei, internal capsule, insula, M1-M3 cortex): 7 - Supraganglionic infarction (M4-M6 cortex): 3 Total score (0-10 with 10 being normal): 10 IMPRESSION: Negative head CT. ASPECTS is 10. Electronically Signed   By: Marnee Spring M.D.   On: 11/07/2018 10:35   Dg Lumbar Puncture Fluoro Guide  Result Date: 11/07/2018 CLINICAL DATA:  Meningitis.  Fever and stroke symptoms. EXAM: DIAGNOSTIC LUMBAR PUNCTURE UNDER FLUOROSCOPIC GUIDANCE FLUOROSCOPY TIME:  Fluoroscopy Time:  1 minutes 42 seconds Radiation Exposure Index (if provided by the fluoroscopic device): 72.3 mGy Number of Acquired Spot Images: 1 PROCEDURE: After  discussing the risks and benefits of this procedure with the patient informed consent was obtained. The back was sterilely prepped and draped. Following local anesthesia with 1% lidocaine a 22 gauge spinal needle was advanced into the lumbar spine thecal sac at the L4-L5 level under fluoroscopic guidance . 6 cc of CSF obtained. Initial CSF was slightly blood-tinged, this cleared. CSF was sent to the lab for further evaluation. Hemostasis achieved following needle removal. There no complications. IMPRESSION: Successful fluoroscopically directed lumbar puncture. CSF samples sent to the laboratory for further evaluation. Electronically Signed   By: Maisie Fus  Register   On: 11/07/2018 13:39        ASSESSMENT/PLAN   Acute hypoxemic respiratory failure   -s/p repeat CT chest - with contrast this time - no PE, bilateral GGO diffuse with AP gradient suggestive of dependent pulmonary edema likely from IVF resuscitation   -Patient is total 3.6 L intake however with good urine output at 2.3 L with net 1.4+, GFR improved overnight baseline CKD stage II   -IV fluids  stopped for now   -Otherwise unremarkable repeat CTh previous   -Transthoracic echo pending   -Supplemental oxygen via nasal cannula   -Patient has incentive spirometer at bedside which is still in bag- pt may open bag and use multiple times each hour   Sepsis bacteremia due to Proteus species   -Likely urinary source   -ID on case continue antimicrobials as recommended   CKD - KDIGO 2   - S/p contrast load - had to receive double infusion due to initial poor load   - monitor GFR - dc non essential nephrotoxins   - BMP in am    - Urine output monitoring - stict I&O   Multiple comorbid conditions  -As per primary team       Thank you Dr Allena Katz for allowing me to participate in the care of this patient.    Patient/Family are satisfied with care plan and all questions have been answered.  This document was prepared using Dragon voice  recognition software and may include unintentional dictation errors.     Vida Rigger, M.D.  Division of Pulmonary & Critical Care Medicine  [facility]

## 2018-11-08 NOTE — Consult Note (Signed)
ANTICOAGULATION CONSULT NOTE - Initial Consult  Pharmacy Consult for Heparin Drip Indication: chest pain/ACS/STEMI  Allergies  Allergen Reactions  . Lamictal [Lamotrigine] Hives  . Statins     Muscle/joint aches    Patient Measurements: Height: 6' (182.9 cm) Weight: 266 lb 1.5 oz (120.7 kg) IBW/kg (Calculated) : 77.6 Heparin Dosing Weight: 104.1kg  Vital Signs: Temp: 99 F (37.2 C) (10/17 1649) Temp Source: Oral (10/17 1649) BP: 128/74 (10/17 1649) Pulse Rate: 95 (10/17 1649)  Labs: Recent Labs    11/07/18 1005 11/08/18 0427 11/08/18 1047 11/08/18 1238  HGB 14.2 12.1*  --   --   HCT 43.0 37.1*  --   --   PLT 117* 96*  --   --   APTT 35  --   --   --   LABPROT 15.3*  --   --   --   INR 1.2  --   --   --   CREATININE 1.82* 1.41*  --   --   TROPONINIHS  --   --  112* 268*    Estimated Creatinine Clearance: 71.9 mL/min (A) (by C-G formula based on SCr of 1.41 mg/dL (H)).   Medical History: Past Medical History:  Diagnosis Date  . Carpal tunnel syndrome on both sides   . Depression   . Diabetes mellitus without complication ([place])   . GERD (gastroesophageal reflux disease)   . Hyperlipidemia   . Hypertension   . Hypothyroidism   . Sleep apnea    CPAP  . Wears hearing aid in both ears     Medications:  No PTA anticoagulant of record-  As inpatient pt received single Lovenox 40mg  sq dose 10/17 @ 0846  Assessment: 63 y o male being treated for probable meningitis now exhibiting cardiac symptoms with elevated troponin.  Pharmacy has been consulted to initiate/monitor heparin drip.  Goal of Therapy:  Heparin level 0.3-0.7 units/ml Monitor platelets by anticoagulation protocol: Yes   Plan:  Give 3000 units bolus x 1, followed by 1450 units/hour.  Will recheck Heparin level in 6 hours per protocol.   Daily CBC's while on heparin drip.  Given slightly lower bolus due to proximity to AM enoxaparin dose.  Lu Duffel, PharmD, BCPS Clinical  Pharmacist 11/08/2018 4:57 PM

## 2018-11-08 NOTE — Progress Notes (Signed)
[employer] [facility] [place] [facility]                                                                                                                                                                                  Patient Demographics   Jacob Lawson, is a 63 y.o. male, DOB - [birth year], GNF:[id number]  Admit date - 11/07/2018   Admitting Physician Jude Enid Baasjie, MD  Outpatient Primary MD for the patient is Mickey Farberhies, David, MD   LOS - 1  Subjective: Patient feeling better.  Denies any chest pain or shortness of breath this morning   Review of Systems:   CONSTITUTIONAL: No documented fever. No fatigue, weakness. No weight gain, no weight loss.  EYES: No blurry or double vision.  ENT: No tinnitus. No postnasal drip. No redness of the oropharynx.  RESPIRATORY: No cough, no wheeze, no hemoptysis. No dyspnea.  CARDIOVASCULAR: No chest pain. No orthopnea. No palpitations. No syncope.  GASTROINTESTINAL: No nausea, no vomiting or diarrhea. No abdominal pain. No melena or hematochezia.  GENITOURINARY: No dysuria or hematuria.  ENDOCRINE: No polyuria or nocturia. No heat or cold intolerance.  HEMATOLOGY: No anemia. No bruising. No bleeding.  INTEGUMENTARY: No rashes. No lesions.  MUSCULOSKELETAL: No arthritis. No swelling. No gout.  NEUROLOGIC: No numbness, tingling, or ataxia. No seizure-type activity.  PSYCHIATRIC: No anxiety. No insomnia. No ADD.    Vitals:   Vitals:   11/08/18 0519 11/08/18 0844 11/08/18 1020 11/08/18 1103  BP: 98/67 132/87    Pulse: 69 84 97 100  Resp: 16 18  (!) 22  Temp: (!) 97.4 F (36.3 C) 98.2 F (36.8 C)    TempSrc: Oral Oral    SpO2: 96% 97% 90% 99%  Weight:      Height:        Wt Readings from Last 3 Encounters:  11/07/18 120.7 kg  12/30/17 120.2 kg  11/19/17 121.1 kg     Intake/Output Summary (Last 24 hours) at 11/08/2018 1239 Last data filed at 11/08/2018 0900 Gross per 24 hour  Intake 2731.89 ml  Output 2394 ml  Net 337.89 ml     Physical Exam:   GENERAL: Pleasant-appearing in no apparent distress.  HEAD, EYES, EARS, NOSE AND THROAT: Atraumatic, normocephalic. Extraocular muscles are intact. Pupils equal and reactive to light. Sclerae anicteric. No conjunctival injection. No oro-pharyngeal erythema.  NECK: Supple. There is no jugular venous distention. No bruits, no lymphadenopathy, no thyromegaly.  HEART: Regular rate and rhythm,. No murmurs, no rubs, no clicks.  LUNGS: Clear to auscultation bilaterally. No rales or rhonchi. No wheezes.  ABDOMEN: Soft, flat, nontender, nondistended. Has good bowel sounds. No hepatosplenomegaly appreciated.  EXTREMITIES: No  evidence of any cyanosis, clubbing, or peripheral edema.  +2 pedal and radial pulses bilaterally.  NEUROLOGIC: The patient is alert, awake, and oriented x3 with no focal motor or sensory deficits appreciated bilaterally.  SKIN: No rash.  Right lower extremity there is an area of fluctuation Psych: Not anxious, depressed LN: No inguinal LN enlargement    Antibiotics   Anti-infectives (From admission, onward)   Start     Dose/Rate Route Frequency Ordered Stop   11/08/18 1445  cefTRIAXone (ROCEPHIN) 2 g in sodium chloride 0.9 % 100 mL IVPB     2 g 200 mL/hr over 30 Minutes Intravenous Every 24 hours 11/07/18 1739     11/08/18 1200  vancomycin (VANCOCIN) 1,500 mg in sodium chloride 0.9 % 500 mL IVPB  Status:  Discontinued     1,500 mg 250 mL/hr over 120 Minutes Intravenous Every 24 hours 11/08/18 0817 11/08/18 1028   11/08/18 1000  vancomycin (VANCOCIN) 1,250 mg in sodium chloride 0.9 % 250 mL IVPB  Status:  Discontinued     1,250 mg 166.7 mL/hr over 90 Minutes Intravenous Every 24 hours 11/07/18 1503 11/08/18 0817   11/07/18 1445  cefTRIAXone (ROCEPHIN) 2 g in sodium chloride 0.9 % 100 mL IVPB  Status:  Discontinued     2 g 200 mL/hr over 30 Minutes Intravenous Every 12 hours 11/07/18 1439 11/07/18 1739   11/07/18 1215  vancomycin (VANCOCIN) 1,250 mg in  sodium chloride 0.9 % 250 mL IVPB     1,250 mg 166.7 mL/hr over 90 Minutes Intravenous  Once 11/07/18 1202 11/07/18 1606   11/07/18 1115  cefTRIAXone (ROCEPHIN) 2 g in sodium chloride 0.9 % 100 mL IVPB     2 g 200 mL/hr over 30 Minutes Intravenous  Once 11/07/18 1102 11/07/18 1205   11/07/18 1115  vancomycin (VANCOCIN) IVPB 1000 mg/200 mL premix     1,000 mg 200 mL/hr over 60 Minutes Intravenous  Once 11/07/18 1102 11/07/18 1314      Medications   Scheduled Meds: . aspirin  325 mg Oral Daily  . cholecalciferol  2,000 Units Oral Daily  . enoxaparin (LOVENOX) injection  40 mg Subcutaneous Q24H  . finasteride  5 mg Oral QPM  . insulin aspart  0-5 Units Subcutaneous QHS  . insulin aspart  0-9 Units Subcutaneous TID WC  . levalbuterol  1.25 mg Nebulization Q6H  . levothyroxine  125 mcg Oral QAC breakfast  . liothyronine  12.5 mcg Oral Daily  . LORazepam  1 mg Intravenous Once  . multivitamin with minerals  1 tablet Oral Daily  . pantoprazole  40 mg Oral Daily  . sertraline  50 mg Oral QPM  . sodium chloride flush  3 mL Intravenous Once  . vitamin B-12  500 mcg Oral Daily   Continuous Infusions: . sodium chloride 100 mL/hr at 11/08/18 0500  . cefTRIAXone (ROCEPHIN)  IV     PRN Meds:.acetaminophen, fluticasone, loratadine   Data Review:   Micro Results Recent Results (from the past 240 hour(s))  Blood culture (routine x 2)     Status: None (Preliminary result)   Collection Time: 11/07/18 10:05 AM   Specimen: BLOOD  Result Value Ref Range Status   Specimen Description BLOOD BLOOD RIGHT FOREARM  Final   Special Requests   Final    BOTTLES DRAWN AEROBIC AND ANAEROBIC Blood Culture adequate volume   Culture   Final    NO GROWTH < 24 HOURS Performed at [facility], [address]  [place], [place] [address]    Report Status PENDING  Incomplete  Blood culture (routine x 2)     Status: None (Preliminary result)   Collection Time: 11/07/18 10:05 AM   Specimen:  BLOOD  Result Value Ref Range Status   Specimen Description BLOOD BLOOD LEFT FOREARM  Final   Special Requests   Final    BOTTLES DRAWN AEROBIC AND ANAEROBIC Blood Culture adequate volume   Culture  Setup Time   Final    Organism ID to follow GRAM NEGATIVE RODS ANAEROBIC BOTTLE ONLY CRITICAL RESULT CALLED TO, READ BACK BY AND VERIFIED WITH: Ducre HALL @506  11/08/2018 TTG Performed at [facility], [address] [place], [place] [address]    Culture GRAM NEGATIVE RODS  Final   Report Status PENDING  Incomplete  Blood Culture ID Panel (Reflexed)     Status: Abnormal   Collection Time: 11/07/18 10:05 AM  Result Value Ref Range Status   Enterococcus species NOT DETECTED NOT DETECTED Final   Listeria monocytogenes NOT DETECTED NOT DETECTED Final   Staphylococcus species NOT DETECTED NOT DETECTED Final   Staphylococcus aureus (BCID) NOT DETECTED NOT DETECTED Final   Streptococcus species NOT DETECTED NOT DETECTED Final   Streptococcus agalactiae NOT DETECTED NOT DETECTED Final   Streptococcus pneumoniae NOT DETECTED NOT DETECTED Final   Streptococcus pyogenes NOT DETECTED NOT DETECTED Final   Acinetobacter baumannii NOT DETECTED NOT DETECTED Final   Enterobacteriaceae species DETECTED (A) NOT DETECTED Final    Comment: Enterobacteriaceae represent a large family of gram-negative bacteria, not a single organism. CRITICAL RESULT CALLED TO, READ BACK BY AND VERIFIED WITH: Resetar HALL @506  11/08/2018 TTG    Enterobacter cloacae complex NOT DETECTED NOT DETECTED Final   Escherichia coli NOT DETECTED NOT DETECTED Final   Klebsiella oxytoca NOT DETECTED NOT DETECTED Final   Klebsiella pneumoniae NOT DETECTED NOT DETECTED Final   Proteus species DETECTED (A) NOT DETECTED Final    Comment: CRITICAL RESULT CALLED TO, READ BACK BY AND VERIFIED WITH: Brucker HALL @506  11/08/2018 TTG    Serratia marcescens NOT DETECTED NOT DETECTED Final   Carbapenem resistance NOT DETECTED NOT  DETECTED Final   Haemophilus influenzae NOT DETECTED NOT DETECTED Final   Neisseria meningitidis NOT DETECTED NOT DETECTED Final   Pseudomonas aeruginosa NOT DETECTED NOT DETECTED Final   Candida albicans NOT DETECTED NOT DETECTED Final   Candida glabrata NOT DETECTED NOT DETECTED Final   Candida krusei NOT DETECTED NOT DETECTED Final   Candida parapsilosis NOT DETECTED NOT DETECTED Final   Candida tropicalis NOT DETECTED NOT DETECTED Final    Comment: Performed at [facility], [address] [place], [place] [address]  SARS Coronavirus 2 by RT PCR (hospital order, performed in [facility] hospital lab) Nasopharyngeal Nasopharyngeal Swab     Status: None   Collection Time: 11/07/18 10:40 AM   Specimen: Nasopharyngeal Swab  Result Value Ref Range Status   SARS Coronavirus 2 NEGATIVE NEGATIVE Final    Comment: (NOTE) If result is NEGATIVE SARS-CoV-2 target nucleic acids are NOT DETECTED. The SARS-CoV-2 RNA is generally detectable in upper and lower  respiratory specimens during the acute phase of infection. The lowest  concentration of SARS-CoV-2 viral copies this assay can detect is 250  copies / mL. A negative result does not preclude SARS-CoV-2 infection  and should not be used as the sole basis for treatment or other  patient management decisions.  A negative result may occur with  improper specimen collection / handling,  submission of specimen other  than nasopharyngeal swab, presence of viral mutation(s) within the  areas targeted by this assay, and inadequate number of viral copies  (<250 copies / mL). A negative result must be combined with clinical  observations, patient history, and epidemiological information. If result is POSITIVE SARS-CoV-2 target nucleic acids are DETECTED. The SARS-CoV-2 RNA is generally detectable in upper and lower  respiratory specimens dur ing the acute phase of infection.  Positive  results are indicative of active infection with  SARS-CoV-2.  Clinical  correlation with patient history and other diagnostic information is  necessary to determine patient infection status.  Positive results do  not rule out bacterial infection or co-infection with other viruses. If result is PRESUMPTIVE POSTIVE SARS-CoV-2 nucleic acids MAY BE PRESENT.   A presumptive positive result was obtained on the submitted specimen  and confirmed on repeat testing.  While 2019 novel coronavirus  (SARS-CoV-2) nucleic acids may be present in the submitted sample  additional confirmatory testing may be necessary for epidemiological  and / or clinical management purposes  to differentiate between  SARS-CoV-2 and other Sarbecovirus currently known to infect humans.  If clinically indicated additional testing with an alternate test  methodology [phone]) is advised. The SARS-CoV-2 RNA is generally  detectable in upper and lower respiratory sp ecimens during the acute  phase of infection. The expected result is Negative. Fact Sheet for Patients:  BoilerBrush.com.cy Fact Sheet for Healthcare Providers: https://pope.com/ This test is not yet approved or cleared by the [place] FDA and has been authorized for detection and/or diagnosis of SARS-CoV-2 by FDA under an Emergency Use Authorization (EUA).  This EUA will remain in effect (meaning this test can be used) for the duration of the COVID-19 declaration under Section 564(b)(1) of the Act, 21 U.S.C. section 360bbb-3(b)(1), unless the authorization is terminated or revoked sooner. Performed at [facility], [address] [place], [place] [address]   CSF culture     Status: None (Preliminary result)   Collection Time: 11/07/18 12:50 PM   Specimen: CSF; Cerebrospinal Fluid  Result Value Ref Range Status   Specimen Description CSF  Final   Special Requests NONE  Final   Gram Stain   Final    WBC SEEN RED BLOOD CELLS NO ORGANISMS  SEEN Performed at [facility], [address] [place], [place] [address]    Culture PENDING  Incomplete   Report Status PENDING  Incomplete  Culture, fungus without smear     Status: None (Preliminary result)   Collection Time: 11/07/18  1:10 PM   Specimen: PATH Cytology CSF; Cerebrospinal Fluid  Result Value Ref Range Status   Specimen Description   Final    CSF Performed at [facility], [address] [place], [place] [address]    Special Requests   Final    NONE Performed at [facility], [address] [place], [place] [address]    Culture   Final    NO FUNGUS ISOLATED AFTER 1 DAY Performed at [facility], [address], [place], [place] [address]    Report Status PENDING  Incomplete    Radiology Reports Ct Chest Wo Contrast  Result Date: 11/07/2018 CLINICAL DATA:  Fever of unknown origin. EXAM: CT CHEST, ABDOMEN AND PELVIS WITHOUT CONTRAST TECHNIQUE: Multidetector CT imaging of the chest, abdomen and pelvis was performed following the standard protocol without IV contrast. COMPARISON:  11/14/2017 CT abdomen and pelvis. FINDINGS: CT CHEST FINDINGS Cardiovascular: Heart  is normal size. Aorta is normal caliber. Scattered aortic and coronary artery calcifications. Mediastinum/Nodes: No mediastinal, hilar, or axillary adenopathy. Trachea and esophagus are unremarkable. Thyroid unremarkable. Lungs/Pleura: Lungs are clear. No focal airspace opacities or suspicious nodules. No effusions. Musculoskeletal: Chest wall soft tissues are unremarkable. No acute bony abnormality. CT ABDOMEN PELVIS FINDINGS Hepatobiliary: Diffuse low-density throughout the liver compatible with fatty infiltration. No focal abnormality. Gallbladder unremarkable. Pancreas: No focal abnormality or ductal dilatation. Spleen: No focal abnormality.  Normal size. Adrenals/Urinary Tract: Exophytic cyst off the lower pole of the left kidney measures up to 4.5 cm, stable. Exophytic  cyst anteriorly off the lower pole of the left kidney also stable. No renal or ureteral stones. No hydronephrosis. Adrenal glands and urinary bladder unremarkable. Stomach/Bowel: Few scattered colonic diverticula. Stomach, large and small bowel grossly unremarkable. Vascular/Lymphatic: Aortic atherosclerosis. Prominent left inguinal lymph node has a short axis diameter of 14 mm. No other enlarged abdominal or pelvic lymph nodes. Reproductive: No visible focal abnormality. Other: No free fluid or free air. Musculoskeletal: No acute bony abnormality. IMPRESSION: No acute cardiopulmonary disease. Aortic atherosclerosis, coronary artery disease. Diffuse fatty infiltration of the liver. No renal or ureteral stones. No hydronephrosis. Stable left renal cysts. Prominent left inguinal lymph node with a short axis diameter of 1.4 cm. This could be followed clinically to ensure stability. Electronically Signed   By: Charlett Nose M.D.   On: 11/07/2018 10:38   Ct Angio Chest Pe W Or Wo Contrast  Result Date: 11/08/2018 CLINICAL DATA:  Increased shortness of breath EXAM: CT ANGIOGRAPHY CHEST WITH CONTRAST TECHNIQUE: Multidetector CT imaging of the chest was performed using the standard protocol during bolus administration of intravenous contrast. Multiplanar CT image reconstructions and MIPs were obtained to evaluate the vascular anatomy. CONTRAST:  OMNIPAQUE IOHEXOL 350 MG/ML SOLN COMPARISON:  CT chest dated 11/07/2018 FINDINGS: Cardiovascular: Initial contrast bolus demonstrates suboptimal contrast opacification, specifically in the bilateral lower lobes. Subsequent contrast bolus demonstrates improved contrast opacification to the lobar level. However, evaluation of the bilateral pulmonary arteries is constrained by respiratory motion, leading to artifact, involving the lung apices and bilateral lower lobes. Within that constraint, there is no evidence of pulmonary embolism. The heart is normal in size. No  pericardial effusion. No evidence thoracic aortic aneurysm. Atherosclerotic calcifications of the aortic arch. Mild coronary atherosclerosis of the LAD. Mediastinum/Nodes: No suspicious mediastinal lymphadenopathy. 8 mm short axis subcarinal node. Visualized left thyroid is unremarkable. Right thyroid is not visualized, presumably surgically absent. Lungs/Pleura: Evaluation of the lung parenchyma is constrained by respiratory motion. Within that constraint, there no suspicious pulmonary nodules. Scattered areas of ground-glass opacity likely reflect expiratory imaging. This appearance is not considered suspicious for interstitial edema. No focal consolidation. No pleural effusion or pneumothorax. Upper Abdomen: Visualized upper abdomen is grossly unremarkable. Musculoskeletal: Degenerative changes of the visualized thoracolumbar spine. Review of the MIP images confirms the above findings. IMPRESSION: Limited evaluation, as above. No evidence of pulmonary embolism. When accounting for respiratory motion, there is no interval change from recent CT. Aortic Atherosclerosis (ICD10-I70.0). Electronically Signed   By: Charline Bills M.D.   On: 11/08/2018 12:10   Dg Chest Portable 1 View  Result Date: 11/07/2018 CLINICAL DATA:  Fever EXAM: PORTABLE CHEST 1 VIEW COMPARISON:  04/03/2006 FINDINGS: The heart size and mediastinal contours are within normal limits. Both lungs are clear. The visualized skeletal structures are unremarkable. IMPRESSION: No acute abnormality of the lungs in AP portable projection. Electronically Signed   By: Lauralyn Primes  M.D.   On: 11/07/2018 10:52   Dg Foot Complete Left  Result Date: 11/08/2018 CLINICAL DATA:  63 year old male with history of ulcer on the left foot. EXAM: LEFT FOOT - COMPLETE 3+ VIEW COMPARISON:  No priors. FINDINGS: There is no evidence of fracture or dislocation. There is no evidence of inflammatory arthropathy or other focal bone abnormality. Soft tissues are  unremarkable. IMPRESSION: Negative. Electronically Signed   By: Trudie Reed M.D.   On: 11/08/2018 11:43   Ct Renal Stone Study  Result Date: 11/07/2018 CLINICAL DATA:  Fever of unknown origin. EXAM: CT CHEST, ABDOMEN AND PELVIS WITHOUT CONTRAST TECHNIQUE: Multidetector CT imaging of the chest, abdomen and pelvis was performed following the standard protocol without IV contrast. COMPARISON:  11/14/2017 CT abdomen and pelvis. FINDINGS: CT CHEST FINDINGS Cardiovascular: Heart is normal size. Aorta is normal caliber. Scattered aortic and coronary artery calcifications. Mediastinum/Nodes: No mediastinal, hilar, or axillary adenopathy. Trachea and esophagus are unremarkable. Thyroid unremarkable. Lungs/Pleura: Lungs are clear. No focal airspace opacities or suspicious nodules. No effusions. Musculoskeletal: Chest wall soft tissues are unremarkable. No acute bony abnormality. CT ABDOMEN PELVIS FINDINGS Hepatobiliary: Diffuse low-density throughout the liver compatible with fatty infiltration. No focal abnormality. Gallbladder unremarkable. Pancreas: No focal abnormality or ductal dilatation. Spleen: No focal abnormality.  Normal size. Adrenals/Urinary Tract: Exophytic cyst off the lower pole of the left kidney measures up to 4.5 cm, stable. Exophytic cyst anteriorly off the lower pole of the left kidney also stable. No renal or ureteral stones. No hydronephrosis. Adrenal glands and urinary bladder unremarkable. Stomach/Bowel: Few scattered colonic diverticula. Stomach, large and small bowel grossly unremarkable. Vascular/Lymphatic: Aortic atherosclerosis. Prominent left inguinal lymph node has a short axis diameter of 14 mm. No other enlarged abdominal or pelvic lymph nodes. Reproductive: No visible focal abnormality. Other: No free fluid or free air. Musculoskeletal: No acute bony abnormality. IMPRESSION: No acute cardiopulmonary disease. Aortic atherosclerosis, coronary artery disease. Diffuse fatty  infiltration of the liver. No renal or ureteral stones. No hydronephrosis. Stable left renal cysts. Prominent left inguinal lymph node with a short axis diameter of 1.4 cm. This could be followed clinically to ensure stability. Electronically Signed   By: Charlett Nose M.D.   On: 11/07/2018 10:38   Ct Head Code Stroke Wo Contrast  Result Date: 11/07/2018 CLINICAL DATA:  Code stroke.  Speech difficulty.  Possible stroke. EXAM: CT HEAD WITHOUT CONTRAST TECHNIQUE: Contiguous axial images were obtained from the base of the skull through the vertex without intravenous contrast. COMPARISON:  Brain MRI 11/17/2012 FINDINGS: Brain: No evidence of acute infarction, hemorrhage, hydrocephalus, extra-axial collection or mass lesion/mass effect. Vascular: No hyperdense vessel or unexpected calcification. Skull: Normal. Negative for fracture or focal lesion. Sinuses/Orbits: No acute finding. Other: These results were called by telephone at the time of interpretation on 11/07/2018 at 10:32 am to provider [facility] , who verbally acknowledged these results. ASPECTS [facility] Stroke Program Early CT Score) - Ganglionic level infarction (caudate, lentiform nuclei, internal capsule, insula, M1-M3 cortex): 7 - Supraganglionic infarction (M4-M6 cortex): 3 Total score (0-10 with 10 being normal): 10 IMPRESSION: Negative head CT. ASPECTS is 10. Electronically Signed   By: Marnee Spring M.D.   On: 11/07/2018 10:35   Dg Lumbar Puncture Fluoro Guide  Result Date: 11/07/2018 CLINICAL DATA:  Meningitis.  Fever and stroke symptoms. EXAM: DIAGNOSTIC LUMBAR PUNCTURE UNDER FLUOROSCOPIC GUIDANCE FLUOROSCOPY TIME:  Fluoroscopy Time:  1 minutes 42 seconds Radiation Exposure Index (if provided by the fluoroscopic device): 72.3 mGy Number of  Acquired Spot Images: 1 PROCEDURE: After discussing the risks and benefits of this procedure with the patient informed consent was obtained. The back was sterilely prepped and draped. Following local  anesthesia with 1% lidocaine a 22 gauge spinal needle was advanced into the lumbar spine thecal sac at the L4-L5 level under fluoroscopic guidance . 6 cc of CSF obtained. Initial CSF was slightly blood-tinged, this cleared. CSF was sent to the lab for further evaluation. Hemostasis achieved following needle removal. There no complications. IMPRESSION: Successful fluoroscopically directed lumbar puncture. CSF samples sent to the laboratory for further evaluation. Electronically Signed   By: Maisie Fus  Register   On: 11/07/2018 13:39     CBC Recent Labs  Lab 11/07/18 1005 11/08/18 0427  WBC 10.7* 10.1  HGB 14.2 12.1*  HCT 43.0 37.1*  PLT 117* 96*  MCV 82.4 83.2  MCH 27.2 27.1  MCHC 33.0 32.6  RDW 14.1 14.5  LYMPHSABS 1.0  --   MONOABS 0.5  --   EOSABS 0.1  --   BASOSABS 0.1  --     Chemistries  Recent Labs  Lab 11/07/18 1005 11/08/18 0427  NA 131* 136  K 3.9 3.8  CL 95* 105  CO2 17* 24  GLUCOSE 206* 193*  BUN 25* 23  CREATININE 1.82* 1.41*  CALCIUM 8.7* 8.3*  MG  --  2.1  AST 44*  --   ALT 27  --   ALKPHOS 61  --   BILITOT 1.3*  --    ------------------------------------------------------------------------------------------------------------------ estimated creatinine clearance is 71.9 mL/min (A) (by C-G formula based on SCr of 1.41 mg/dL (H)). ------------------------------------------------------------------------------------------------------------------ Recent Labs    11/07/18 1005  HGBA1C 6.7*   ------------------------------------------------------------------------------------------------------------------ No results for input(s): CHOL, HDL, LDLCALC, TRIG, CHOLHDL, LDLDIRECT in the last 72 hours. ------------------------------------------------------------------------------------------------------------------ Recent Labs    11/08/18 0427  TSH 0.522    ------------------------------------------------------------------------------------------------------------------ No results for input(s): VITAMINB12, FOLATE, FERRITIN, TIBC, IRON, RETICCTPCT in the last 72 hours.  Coagulation profile Recent Labs  Lab 11/07/18 1005  INR 1.2    No results for input(s): DDIMER in the last 72 hours.  Cardiac Enzymes No results for input(s): CKMB, TROPONINI, MYOGLOBIN in the last 168 hours.  Invalid input(s): CK ------------------------------------------------------------------------------------------------------------------ Invalid input(s): POCBNP    Assessment & Plan   Patient is a 63 year old male with history of hypertension, borderline diabetes mellitus, hyperlipidemia, obstructive sleep apnea for which patient uses CPAP at night and hypothyroidism being admitted for suspected meningitis and resolved neurological symptoms.  1.    Sepsis source felt to be due to urine or foot Continue vancomycin and cefepime Follow blood culture   2.  Resolving neurological symptoms. I have ordered MRI of the brain   3.  Hypertension Patient has hypertension blood pressure medications on hold  4.  Borderline diabetes mellitus Holding off on Metformin Placed on sliding scale insulin coverage.  Glycosylated hemoglobin level in a.m.  5.  Acute kidney injury Improved with IV fluid  6.  Hypothyroidism Continue Synthroid.  TSH level in a.m.  DVT prophylaxis; Lovenox  All the records are reviewed and case discussed with ED provider. Management plans discussed with the patient, family and they are in agreement. Updated wife present at bedside and treatment plans as outlined above   CODE STATUS: Full code  TOTAL TIME TAKING CARE OF THIS PATIENT: 35 minutes.    Jude Ojie M.D on 11/07/2018 at 3:18 PM  Between 7am to 6pm - Pager - [phone]  After 6pm go to [email or web address] -  password Airline pilot  [facility]  Office  [phone]  CC: Primary care physician; Ezequiel Kayser, MD   Note: This dictation was prepared with Dragon dictation along with smaller phrase technology. Any transcriptional errors that result from this process are unintentional.        Routing History                 Code Status Orders  (From admission, onward)         Start     Ordered   11/07/18 1436  Full code  Continuous     11/07/18 1436        Code Status History    This patient has a current code status but no historical code status.   Advance Care Planning Activity           Consults none  DVT Prophylaxis  Lovenox   Lab Results  Component Value Date   PLT 96 (L) 11/08/2018     Time Spent in minutes   71min Greater than 50% of time spent in care coordination and counseling patient regarding the condition and plan of care.   Dustin Flock M.D on 11/08/2018 at 12:39 PM  Between 7am to 6pm - Pager - [phone]  After 6pm go to [email or web address] - Proofreader  [employer]   Office  [phone]

## 2018-11-08 NOTE — Progress Notes (Signed)
SLP Cancellation Note  Patient Details Name: Jacob Lawson MRN: [id number] DOB: [birth year]   Cancelled treatment:       Reason Eval/Treat Not Completed: SLP screened, no needs identified, will sign off; Patient oriented x4 with overall reported resolving speech/memory symptoms reported earlier in admission. No acute findings on recent CT. No reported or observable difficulty with regular and thin liquid diet. RN also reported patient "looking better". At this time, skilled ST services not indicated. Patient verbalized understanding and agreement to reporting any changes in swallowing function or cognition to MD during hospital stay.   Loni Beckwith, M.S. CCC-SLP Speech-Language Pathologist   Loni Beckwith 11/08/2018, 9:14 AM

## 2018-11-08 NOTE — Consult Note (Signed)
Pharmacy Antibiotic Note  Jacob Lawson is a 63 y.o. male admitted on 11/07/2018 with suspected meningitis. Pharmacy has been consulted for Vancomycin dosing.  Plan: Per ID note, no evidence of meningitis clinically and by LP. Will follow AUC now that meningitis ruled out.  Renal function improved today. Will change vancomycin as below.   Vancomycin 1500 mg IV Q 24 hrs Goal AUC 400-550 Expected AUC: 463.7 SCr used: 1.4  Will continue to follow renal function and adjust dose as indicated.  Ceftriaxone 2 g IV q 24h for positive blood culture with proteus species in 1 of 2 sets   Height: 6' (182.9 cm) Weight: 266 lb 1.5 oz (120.7 kg) IBW/kg (Calculated) : 77.6  Temp (24hrs), Avg:97.7 F (36.5 C), Min:96.8 F (36 C), Max:98.8 F (37.1 C)  Recent Labs  Lab 11/07/18 1005 11/07/18 1247 11/08/18 0427  WBC 10.7*  --  10.1  CREATININE 1.82*  --  1.41*  LATICACIDVEN 8.1* 2.3*  --     Estimated Creatinine Clearance: 71.9 mL/min (A) (by C-G formula based on SCr of 1.41 mg/dL (H)).    Allergies  Allergen Reactions  . Lamictal [Lamotrigine] Hives  . Statins     Muscle/joint aches    Antimicrobials this admission: Vancomycin 10/16 >> Ceftriaxone 10/16 >>  Dose adjustments this admission: 10/17 vanc 1250 mg q24h >> 1500 mg q24h  Microbiology results: Blood cx 10/16 >> 1 of 2 bottles w/ proteus  species Urine cx 10/16 >> pending CSF cultures 10/16 >> pending COVID NEG  Thank you for allowing pharmacy to be a part of this patient's care.  Tawnya Crook, PharmD Clinical Pharmacist 11/08/2018 8:20 AM

## 2018-11-08 NOTE — Progress Notes (Signed)
It was noted that patient had a repeat troponin of 268. Dr Dustin Flock, MD notified. Heparin drip ordered. Cardiology consulted (Dr. Bartholome Bill, MD). Will continue to monitor.

## 2018-11-08 NOTE — Evaluation (Signed)
Physical Therapy Evaluation Patient Details Name: Jacob Lawson MRN: [id number] DOB: 04/14/1955 Today's Date: 11/08/2018   History of Present Illness  Pt is a 63 year old male admitted for stroke workup and suspected meninigitis following c/o L side weakness.  Pt - for meningitis.  PMH includes hearing aide use, sleep apnea, Htn, DM, depression and carpal tunnel.  Clinical Impression  Pt is a 63 year old male who lives is independent with mobility at baseline and lives with his wife.  He was lying supine in bed and appearing slightly lethargic, stating that he hasn't slept well in 4 days.  Bed mobility and transfers independent and pt presented with good overall strength.  He did fatigue quickly and demonstrated a mild tremor during coordination testing which he attributed to feeling "just weak overall".  He was able to ambulate 40 ft without AD but desat to upper 80% range and became mildly tachycardic.  He recovered to low 90% range with 1 min of rest.  Pt with good coordination and no report of N/T, visual deficits or HA.  Will keep Pt on PT schedule in hospital to address decreased tolerance to activity but no recommendation for PT follow up at discharge.    Follow Up Recommendations No PT follow up    Equipment Recommendations  None recommended by PT    Recommendations for Other Services       Precautions / Restrictions Precautions Precautions: None      Mobility  Bed Mobility Overal bed mobility: Independent                Transfers Overall transfer level: Independent                  Ambulation/Gait Ambulation/Gait assistance: Supervision Gait Distance (Feet): 40 Feet Assistive device: None     Gait velocity interpretation: 1.31 - 2.62 ft/sec, indicative of limited community ambulator General Gait Details: Wider BOS, good foot clearance and step length, pt did become fatigued during ambulation and O2 measured 89%.  Recovered to 90% in 1 min.  Stairs             Wheelchair Mobility    Modified Rankin (Stroke Patients Only)       Balance Overall balance assessment: Independent                                           Pertinent Vitals/Pain Pain Assessment: No/denies pain(Denies HA or neck pain.)    Home Living Family/patient expects to be discharged to:: Private residence Living Arrangements: Spouse/significant other Available Help at Discharge: Family;Available PRN/intermittently Type of Home: House         Home Equipment: None      Prior Function Level of Independence: Independent         Comments: Community ambulator without AD.     Hand Dominance        Extremity/Trunk Assessment   Upper Extremity Assessment Upper Extremity Assessment: Overall WFL for tasks assessed(Grip, elbow flexion/extension, shoulder flexion: 5/5 with full shoulder ROM.)    Lower Extremity Assessment Lower Extremity Assessment: Overall WFL for tasks assessed(Ankle flexion/extension, knee flexion/extension, hip  flexion: 5/5 bilaterally.  No N/T reported.)    Cervical / Trunk Assessment Cervical / Trunk Assessment: Normal  Communication   Communication: No difficulties  Cognition Arousal/Alertness: Lethargic(Reported very little sleep in past 4 days.) Behavior During Therapy: [facility]  for tasks assessed/performed Overall Cognitive Status: Within Functional Limits for tasks assessed                                        General Comments General comments (skin integrity, edema, etc.): Coordination: UE: finger to nose: 5 sec with no past pointing, mild tremor but pt stated that he just feels "weak" overall.    Exercises     Assessment/Plan    PT Assessment Patient needs continued PT services  PT Problem List Decreased strength;Decreased mobility;Decreased activity tolerance       PT Treatment Interventions      PT Goals (Current goals can be found in the Care Plan section)  Acute  Rehab PT Goals Patient Stated Goal: to return to generally active lifestyle. PT Goal Formulation: With patient Time For Goal Achievement: 11/22/18 Potential to Achieve Goals: Good    Frequency Min 2X/week   Barriers to discharge        Co-evaluation               AM-PAC PT "6 Clicks" Mobility  Outcome Measure Help needed turning from your back to your side while in a flat bed without using bedrails?: None Help needed moving from lying on your back to sitting on the side of a flat bed without using bedrails?: None Help needed moving to and from a bed to a chair (including a wheelchair)?: None Help needed standing up from a chair using your arms (e.g., wheelchair or bedside chair)?: None Help needed to walk in hospital room?: A Little Help needed climbing 3-5 steps with a railing? : A Little 6 Click Score: 22    End of Session Equipment Utilized During Treatment: Gait belt Activity Tolerance: Patient tolerated treatment well;Patient limited by fatigue Patient left: in bed;with call bell/phone within reach Nurse Communication: Mobility status PT Visit Diagnosis: Muscle weakness (generalized) (M62.81);Difficulty in walking, not elsewhere classified (R26.2)    Time: 4481-8563 PT Time Calculation (min) (ACUTE ONLY): 10 min   Charges:   PT Evaluation $PT Eval Low Complexity: 1 Low          Roxanne Gates, PT, DPT   Roxanne Gates 11/08/2018, 10:29 AM

## 2018-11-08 NOTE — Progress Notes (Signed)
OT Cancellation Note  Patient Details Name: Jacob Lawson MRN: [id number] DOB: [birth year]   Cancelled Treatment:    Reason Eval/Treat Not Completed: Patient at procedure or test/ unavailable. Order received, chart reviewed. Pt out of room for testing (CT angio of chest to r/o PE). Will re-attempt OT evaluation at later date/time as pt is available and medically appropriate.  Jeni Salles, MPH, MS, OTR/L ascom [phone] 11/08/18, 11:52 AM

## 2018-11-08 NOTE — Progress Notes (Signed)
*  PRELIMINARY RESULTS* Echocardiogram 2D Echocardiogram has been performed.  Jacob Lawson 11/08/2018, 12:15 PM

## 2018-11-08 NOTE — Progress Notes (Signed)
CRITICAL VALUE ALERT  Critical Value:  Troponin of 112  Date & Time Notied:  11/08/2018 at 1220pm  Provider Notified: Dustin Flock, MD, Ottie Glazier, MD  Orders Received/Actions taken: IV fluids to be discontinued per Dr Posey Pronto

## 2018-11-08 NOTE — Progress Notes (Signed)
[employer] [facility] [place] [facility]                                                                                                                                                                                  Patient Demographics   Jacob Lawson, is a 63 y.o. male, DOB - [birth year], ZOX:[id number]  Admit date - 11/07/2018   Admitting Physician Jude Enid Baas, MD  Outpatient Primary MD for the patient is Mickey Farber, MD   LOS - 1  Subjective: Called by nurse patient saying that he is having chills he is also complaining of shortness of breath, patient tachycardic    Review of Systems:   CONSTITUTIONAL: No documented fever. No fatigue, weakness. No weight gain, no weight loss.  Positive chills EYES: No blurry or double vision.  ENT: No tinnitus. No postnasal drip. No redness of the oropharynx.  RESPIRATORY: No cough, no wheeze, no hemoptysis.  Positive dyspnea.  CARDIOVASCULAR: No chest pain. No orthopnea. No palpitations. No syncope.  GASTROINTESTINAL: No nausea, no vomiting or diarrhea. No abdominal pain. No melena or hematochezia.  GENITOURINARY: No dysuria or hematuria.  ENDOCRINE: No polyuria or nocturia. No heat or cold intolerance.  HEMATOLOGY: No anemia. No bruising. No bleeding.  INTEGUMENTARY: No rashes. No lesions.  MUSCULOSKELETAL: No arthritis. No swelling. No gout.  NEUROLOGIC: No numbness, tingling, or ataxia. No seizure-type activity.  PSYCHIATRIC: No anxiety. No insomnia. No ADD.    Vitals:   Vitals:   11/08/18 0519 11/08/18 0844 11/08/18 1020 11/08/18 1103  BP: 98/67 132/87    Pulse: 69 84 97 100  Resp: 16 18  (!) 22  Temp: (!) 97.4 F (36.3 C) 98.2 F (36.8 C)    TempSrc: Oral Oral    SpO2: 96% 97% 90% 99%  Weight:      Height:        Wt Readings from Last 3 Encounters:  11/07/18 120.7 kg  12/30/17 120.2 kg  11/19/17 121.1 kg     Intake/Output Summary (Last 24 hours) at 11/08/2018 1251 Last data filed at 11/08/2018 0900 Gross  per 24 hour  Intake 2731.89 ml  Output 2394 ml  Net 337.89 ml    Physical Exam:   GENERAL: Patient in distress HEAD, EYES, EARS, NOSE AND THROAT: Atraumatic, normocephalic. Extraocular muscles are intact. Pupils equal and reactive to light. Sclerae anicteric. No conjunctival injection. No oro-pharyngeal erythema.  NECK: Supple. There is no jugular venous distention. No bruits, no lymphadenopathy, no thyromegaly.  HEART: Regular rate and rhythm,. No murmurs, no rubs, no clicks.  LUNGS: Clear to auscultation bilaterally. No rales or rhonchi. No wheezes.  ABDOMEN: Soft, flat, nontender, nondistended. Has good bowel  sounds. No hepatosplenomegaly appreciated.  EXTREMITIES: No evidence of any cyanosis, clubbing, or peripheral edema.  +2 pedal and radial pulses bilaterally.  NEUROLOGIC: The patient is alert, awake, and oriented x3 with no focal motor or sensory deficits appreciated bilaterally.  SKIN: Moist and warm with no rashes appreciated.  Psych: Not anxious, depressed LN: No inguinal LN enlargement    Antibiotics   Anti-infectives (From admission, onward)   Start     Dose/Rate Route Frequency Ordered Stop   11/08/18 1445  cefTRIAXone (ROCEPHIN) 2 g in sodium chloride 0.9 % 100 mL IVPB     2 g 200 mL/hr over 30 Minutes Intravenous Every 24 hours 11/07/18 1739     11/08/18 1200  vancomycin (VANCOCIN) 1,500 mg in sodium chloride 0.9 % 500 mL IVPB  Status:  Discontinued     1,500 mg 250 mL/hr over 120 Minutes Intravenous Every 24 hours 11/08/18 0817 11/08/18 1028   11/08/18 1000  vancomycin (VANCOCIN) 1,250 mg in sodium chloride 0.9 % 250 mL IVPB  Status:  Discontinued     1,250 mg 166.7 mL/hr over 90 Minutes Intravenous Every 24 hours 11/07/18 1503 11/08/18 0817   11/07/18 1445  cefTRIAXone (ROCEPHIN) 2 g in sodium chloride 0.9 % 100 mL IVPB  Status:  Discontinued     2 g 200 mL/hr over 30 Minutes Intravenous Every 12 hours 11/07/18 1439 11/07/18 1739   11/07/18 1215  vancomycin  (VANCOCIN) 1,250 mg in sodium chloride 0.9 % 250 mL IVPB     1,250 mg 166.7 mL/hr over 90 Minutes Intravenous  Once 11/07/18 1202 11/07/18 1606   11/07/18 1115  cefTRIAXone (ROCEPHIN) 2 g in sodium chloride 0.9 % 100 mL IVPB     2 g 200 mL/hr over 30 Minutes Intravenous  Once 11/07/18 1102 11/07/18 1205   11/07/18 1115  vancomycin (VANCOCIN) IVPB 1000 mg/200 mL premix     1,000 mg 200 mL/hr over 60 Minutes Intravenous  Once 11/07/18 1102 11/07/18 1314      Medications   Scheduled Meds: . aspirin  325 mg Oral Daily  . cholecalciferol  2,000 Units Oral Daily  . enoxaparin (LOVENOX) injection  40 mg Subcutaneous Q24H  . finasteride  5 mg Oral QPM  . insulin aspart  0-5 Units Subcutaneous QHS  . insulin aspart  0-9 Units Subcutaneous TID WC  . levalbuterol  1.25 mg Nebulization Q6H  . levothyroxine  125 mcg Oral QAC breakfast  . liothyronine  12.5 mcg Oral Daily  . LORazepam  1 mg Intravenous Once  . multivitamin with minerals  1 tablet Oral Daily  . pantoprazole  40 mg Oral Daily  . sertraline  50 mg Oral QPM  . sodium chloride flush  3 mL Intravenous Once  . vitamin B-12  500 mcg Oral Daily   Continuous Infusions: . sodium chloride 100 mL/hr at 11/08/18 0500  . cefTRIAXone (ROCEPHIN)  IV     PRN Meds:.acetaminophen, fluticasone, loratadine   Data Review:   Micro Results Recent Results (from the past 240 hour(s))  Blood culture (routine x 2)     Status: None (Preliminary result)   Collection Time: 11/07/18 10:05 AM   Specimen: BLOOD  Result Value Ref Range Status   Specimen Description BLOOD BLOOD RIGHT FOREARM  Final   Special Requests   Final    BOTTLES DRAWN AEROBIC AND ANAEROBIC Blood Culture adequate volume   Culture   Final    NO GROWTH < 24 HOURS Performed at [facility], [address] [place], [place] [address]    Report Status PENDING  Incomplete  Blood culture (routine x 2)     Status: None (Preliminary result)   Collection Time: 11/07/18  10:05 AM   Specimen: BLOOD  Result Value Ref Range Status   Specimen Description BLOOD BLOOD LEFT FOREARM  Final   Special Requests   Final    BOTTLES DRAWN AEROBIC AND ANAEROBIC Blood Culture adequate volume   Culture  Setup Time   Final    Organism ID to follow GRAM NEGATIVE RODS ANAEROBIC BOTTLE ONLY CRITICAL RESULT CALLED TO, READ BACK BY AND VERIFIED WITH: Waide HALL  11/08/2018 TTG Performed at [facility], [address] [place], [place] [address]    Culture GRAM NEGATIVE RODS  Final   Report Status PENDING  Incomplete  Blood Culture ID Panel (Reflexed)     Status: Abnormal   Collection Time: 11/07/18 10:05 AM  Result Value Ref Range Status   Enterococcus species NOT DETECTED NOT DETECTED Final   Listeria monocytogenes NOT DETECTED NOT DETECTED Final   Staphylococcus species NOT DETECTED NOT DETECTED Final   Staphylococcus aureus (BCID) NOT DETECTED NOT DETECTED Final   Streptococcus species NOT DETECTED NOT DETECTED Final   Streptococcus agalactiae NOT DETECTED NOT DETECTED Final   Streptococcus pneumoniae NOT DETECTED NOT DETECTED Final   Streptococcus pyogenes NOT DETECTED NOT DETECTED Final   Acinetobacter baumannii NOT DETECTED NOT DETECTED Final   Enterobacteriaceae species DETECTED (A) NOT DETECTED Final    Comment: Enterobacteriaceae represent a large family of gram-negative bacteria, not a single organism. CRITICAL RESULT CALLED TO, READ BACK BY AND VERIFIED WITH: [place] HALL  11/08/2018 TTG    Enterobacter cloacae complex NOT DETECTED NOT DETECTED Final   Escherichia coli NOT DETECTED NOT DETECTED Final   Klebsiella oxytoca NOT DETECTED NOT DETECTED Final   Klebsiella pneumoniae NOT DETECTED NOT DETECTED Final   Proteus species DETECTED (A) NOT DETECTED Final    Comment: CRITICAL RESULT CALLED TO, READ BACK BY AND VERIFIED WITH: Marschke HALL  11/08/2018 TTG    Serratia marcescens NOT DETECTED NOT DETECTED Final   Carbapenem resistance  NOT DETECTED NOT DETECTED Final   Haemophilus influenzae NOT DETECTED NOT DETECTED Final   Neisseria meningitidis NOT DETECTED NOT DETECTED Final   Pseudomonas aeruginosa NOT DETECTED NOT DETECTED Final   Candida albicans NOT DETECTED NOT DETECTED Final   Candida glabrata NOT DETECTED NOT DETECTED Final   Candida krusei NOT DETECTED NOT DETECTED Final   Candida parapsilosis NOT DETECTED NOT DETECTED Final   Candida tropicalis NOT DETECTED NOT DETECTED Final    Comment: Performed at [facility], [address] [place], [place] [address]  SARS Coronavirus 2 by RT PCR (hospital order, performed in [facility] hospital lab) Nasopharyngeal Nasopharyngeal Swab     Status: None   Collection Time: 11/07/18 10:40 AM   Specimen: Nasopharyngeal Swab  Result Value Ref Range Status   SARS Coronavirus 2 NEGATIVE NEGATIVE Final    Comment: (NOTE) If result is NEGATIVE SARS-CoV-2 target nucleic acids are NOT DETECTED. The SARS-CoV-2 RNA is generally detectable in upper and lower  respiratory specimens during the acute phase of infection. The lowest  concentration of SARS-CoV-2 viral copies this assay can detect is 250  copies / mL. A negative result does not preclude SARS-CoV-2 infection  and should not be used as the sole basis for treatment or other  patient management decisions.  A negative result may occur with  improper specimen  collection / handling, submission of specimen other  than nasopharyngeal swab, presence of viral mutation(s) within the  areas targeted by this assay, and inadequate number of viral copies  (<250 copies / mL). A negative result must be combined with clinical  observations, patient history, and epidemiological information. If result is POSITIVE SARS-CoV-2 target nucleic acids are DETECTED. The SARS-CoV-2 RNA is generally detectable in upper and lower  respiratory specimens dur ing the acute phase of infection.  Positive  results are indicative of active  infection with SARS-CoV-2.  Clinical  correlation with patient history and other diagnostic information is  necessary to determine patient infection status.  Positive results do  not rule out bacterial infection or co-infection with other viruses. If result is PRESUMPTIVE POSTIVE SARS-CoV-2 nucleic acids MAY BE PRESENT.   A presumptive positive result was obtained on the submitted specimen  and confirmed on repeat testing.  While 2019 novel coronavirus  (SARS-CoV-2) nucleic acids may be present in the submitted sample  additional confirmatory testing may be necessary for epidemiological  and / or clinical management purposes  to differentiate between  SARS-CoV-2 and other Sarbecovirus currently known to infect humans.  If clinically indicated additional testing with an alternate test  methodology [phone]) is advised. The SARS-CoV-2 RNA is generally  detectable in upper and lower respiratory sp ecimens during the acute  phase of infection. The expected result is Negative. Fact Sheet for Patients:  BoilerBrush.com.cy Fact Sheet for Healthcare Providers: https://pope.com/ This test is not yet approved or cleared by the [place] FDA and has been authorized for detection and/or diagnosis of SARS-CoV-2 by FDA under an Emergency Use Authorization (EUA).  This EUA will remain in effect (meaning this test can be used) for the duration of the COVID-19 declaration under Section 564(b)(1) of the Act, 21 U.S.C. section 360bbb-3(b)(1), unless the authorization is terminated or revoked sooner. Performed at [facility], [address] [place], [place] [address]   CSF culture     Status: None (Preliminary result)   Collection Time: 11/07/18 12:50 PM   Specimen: CSF; Cerebrospinal Fluid  Result Value Ref Range Status   Specimen Description CSF  Final   Special Requests NONE  Final   Gram Stain   Final    WBC SEEN RED BLOOD  CELLS NO ORGANISMS SEEN Performed at [facility], [address], [place], [place] [address]    Culture PENDING  Incomplete   Report Status PENDING  Incomplete  Culture, fungus without smear     Status: None (Preliminary result)   Collection Time: 11/07/18  1:10 PM   Specimen: PATH Cytology CSF; Cerebrospinal Fluid  Result Value Ref Range Status   Specimen Description   Final    CSF Performed at [facility], [address] [place], [place] [address]    Special Requests   Final    NONE Performed at [facility], [address] [place], [place] [address]    Culture   Final    NO FUNGUS ISOLATED AFTER 1 DAY Performed at [facility], [address], [place], [place] [address]    Report Status PENDING  Incomplete    Radiology Reports Ct Chest Wo Contrast  Result Date: 11/07/2018 CLINICAL DATA:  Fever of unknown origin. EXAM: CT CHEST, ABDOMEN AND PELVIS WITHOUT CONTRAST TECHNIQUE: Multidetector CT imaging of the chest, abdomen and pelvis was performed following the standard protocol without IV contrast. COMPARISON:  11/14/2017 CT abdomen and pelvis. FINDINGS: CT CHEST  FINDINGS Cardiovascular: Heart is normal size. Aorta is normal caliber. Scattered aortic and coronary artery calcifications. Mediastinum/Nodes: No mediastinal, hilar, or axillary adenopathy. Trachea and esophagus are unremarkable. Thyroid unremarkable. Lungs/Pleura: Lungs are clear. No focal airspace opacities or suspicious nodules. No effusions. Musculoskeletal: Chest wall soft tissues are unremarkable. No acute bony abnormality. CT ABDOMEN PELVIS FINDINGS Hepatobiliary: Diffuse low-density throughout the liver compatible with fatty infiltration. No focal abnormality. Gallbladder unremarkable. Pancreas: No focal abnormality or ductal dilatation. Spleen: No focal abnormality.  Normal size. Adrenals/Urinary Tract: Exophytic cyst off the lower pole of the left kidney measures up to 4.5 cm,  stable. Exophytic cyst anteriorly off the lower pole of the left kidney also stable. No renal or ureteral stones. No hydronephrosis. Adrenal glands and urinary bladder unremarkable. Stomach/Bowel: Few scattered colonic diverticula. Stomach, large and small bowel grossly unremarkable. Vascular/Lymphatic: Aortic atherosclerosis. Prominent left inguinal lymph node has a short axis diameter of 14 mm. No other enlarged abdominal or pelvic lymph nodes. Reproductive: No visible focal abnormality. Other: No free fluid or free air. Musculoskeletal: No acute bony abnormality. IMPRESSION: No acute cardiopulmonary disease. Aortic atherosclerosis, coronary artery disease. Diffuse fatty infiltration of the liver. No renal or ureteral stones. No hydronephrosis. Stable left renal cysts. Prominent left inguinal lymph node with a short axis diameter of 1.4 cm. This could be followed clinically to ensure stability. Electronically Signed   By: Rolm Baptise M.D.   On: 11/07/2018 10:38   Ct Angio Chest Pe W Or Wo Contrast  Result Date: 11/08/2018 CLINICAL DATA:  Increased shortness of breath EXAM: CT ANGIOGRAPHY CHEST WITH CONTRAST TECHNIQUE: Multidetector CT imaging of the chest was performed using the standard protocol during bolus administration of intravenous contrast. Multiplanar CT image reconstructions and MIPs were obtained to evaluate the vascular anatomy. CONTRAST:  173mL OMNIPAQUE IOHEXOL 350 MG/ML SOLN COMPARISON:  CT chest dated 11/07/2018 FINDINGS: Cardiovascular: Initial contrast bolus demonstrates suboptimal contrast opacification, specifically in the bilateral lower lobes. Subsequent contrast bolus demonstrates improved contrast opacification to the lobar level. However, evaluation of the bilateral pulmonary arteries is constrained by respiratory motion, leading to artifact, involving the lung apices and bilateral lower lobes. Within that constraint, there is no evidence of pulmonary embolism. The heart is normal in  size. No pericardial effusion. No evidence thoracic aortic aneurysm. Atherosclerotic calcifications of the aortic arch. Mild coronary atherosclerosis of the LAD. Mediastinum/Nodes: No suspicious mediastinal lymphadenopathy. 8 mm short axis subcarinal node. Visualized left thyroid is unremarkable. Right thyroid is not visualized, presumably surgically absent. Lungs/Pleura: Evaluation of the lung parenchyma is constrained by respiratory motion. Within that constraint, there no suspicious pulmonary nodules. Scattered areas of ground-glass opacity likely reflect expiratory imaging. This appearance is not considered suspicious for interstitial edema. No focal consolidation. No pleural effusion or pneumothorax. Upper Abdomen: Visualized upper abdomen is grossly unremarkable. Musculoskeletal: Degenerative changes of the visualized thoracolumbar spine. Review of the MIP images confirms the above findings. IMPRESSION: Limited evaluation, as above. No evidence of pulmonary embolism. When accounting for respiratory motion, there is no interval change from recent CT. Aortic Atherosclerosis (ICD10-I70.0). Electronically Signed   By: Julian Hy M.D.   On: 11/08/2018 12:10   Dg Chest Portable 1 View  Result Date: 11/07/2018 CLINICAL DATA:  Fever EXAM: PORTABLE CHEST 1 VIEW COMPARISON:  04/03/2006 FINDINGS: The heart size and mediastinal contours are within normal limits. Both lungs are clear. The visualized skeletal structures are unremarkable. IMPRESSION: No acute abnormality of the lungs in AP portable projection. Electronically Signed   By:  Lauralyn PrimesAlex  Bibbey M.D.   On: 11/07/2018 10:52   Dg Foot Complete Left  Result Date: 11/08/2018 CLINICAL DATA:  63 year old male with history of ulcer on the left foot. EXAM: LEFT FOOT - COMPLETE 3+ VIEW COMPARISON:  No priors. FINDINGS: There is no evidence of fracture or dislocation. There is no evidence of inflammatory arthropathy or other focal bone abnormality. Soft tissues  are unremarkable. IMPRESSION: Negative. Electronically Signed   By: Trudie Reedaniel  Entrikin M.D.   On: 11/08/2018 11:43   Ct Renal Stone Study  Result Date: 11/07/2018 CLINICAL DATA:  Fever of unknown origin. EXAM: CT CHEST, ABDOMEN AND PELVIS WITHOUT CONTRAST TECHNIQUE: Multidetector CT imaging of the chest, abdomen and pelvis was performed following the standard protocol without IV contrast. COMPARISON:  11/14/2017 CT abdomen and pelvis. FINDINGS: CT CHEST FINDINGS Cardiovascular: Heart is normal size. Aorta is normal caliber. Scattered aortic and coronary artery calcifications. Mediastinum/Nodes: No mediastinal, hilar, or axillary adenopathy. Trachea and esophagus are unremarkable. Thyroid unremarkable. Lungs/Pleura: Lungs are clear. No focal airspace opacities or suspicious nodules. No effusions. Musculoskeletal: Chest wall soft tissues are unremarkable. No acute bony abnormality. CT ABDOMEN PELVIS FINDINGS Hepatobiliary: Diffuse low-density throughout the liver compatible with fatty infiltration. No focal abnormality. Gallbladder unremarkable. Pancreas: No focal abnormality or ductal dilatation. Spleen: No focal abnormality.  Normal size. Adrenals/Urinary Tract: Exophytic cyst off the lower pole of the left kidney measures up to 4.5 cm, stable. Exophytic cyst anteriorly off the lower pole of the left kidney also stable. No renal or ureteral stones. No hydronephrosis. Adrenal glands and urinary bladder unremarkable. Stomach/Bowel: Few scattered colonic diverticula. Stomach, large and small bowel grossly unremarkable. Vascular/Lymphatic: Aortic atherosclerosis. Prominent left inguinal lymph node has a short axis diameter of 14 mm. No other enlarged abdominal or pelvic lymph nodes. Reproductive: No visible focal abnormality. Other: No free fluid or free air. Musculoskeletal: No acute bony abnormality. IMPRESSION: No acute cardiopulmonary disease. Aortic atherosclerosis, coronary artery disease. Diffuse fatty  infiltration of the liver. No renal or ureteral stones. No hydronephrosis. Stable left renal cysts. Prominent left inguinal lymph node with a short axis diameter of 1.4 cm. This could be followed clinically to ensure stability. Electronically Signed   By: Charlett NoseKevin  Dover M.D.   On: 11/07/2018 10:38   Ct Head Code Stroke Wo Contrast  Result Date: 11/07/2018 CLINICAL DATA:  Code stroke.  Speech difficulty.  Possible stroke. EXAM: CT HEAD WITHOUT CONTRAST TECHNIQUE: Contiguous axial images were obtained from the base of the skull through the vertex without intravenous contrast. COMPARISON:  Brain MRI 11/17/2012 FINDINGS: Brain: No evidence of acute infarction, hemorrhage, hydrocephalus, extra-axial collection or mass lesion/mass effect. Vascular: No hyperdense vessel or unexpected calcification. Skull: Normal. Negative for fracture or focal lesion. Sinuses/Orbits: No acute finding. Other: These results were called by telephone at the time of interpretation on 11/07/2018 at 10:32 am to provider [facility] , who verbally acknowledged these results. ASPECTS [facility]([place] Stroke Program Early CT Score) - Ganglionic level infarction (caudate, lentiform nuclei, internal capsule, insula, M1-M3 cortex): 7 - Supraganglionic infarction (M4-M6 cortex): 3 Total score (0-10 with 10 being normal): 10 IMPRESSION: Negative head CT. ASPECTS is 10. Electronically Signed   By: Marnee SpringJonathon  Watts M.D.   On: 11/07/2018 10:35   Dg Lumbar Puncture Fluoro Guide  Result Date: 11/07/2018 CLINICAL DATA:  Meningitis.  Fever and stroke symptoms. EXAM: DIAGNOSTIC LUMBAR PUNCTURE UNDER FLUOROSCOPIC GUIDANCE FLUOROSCOPY TIME:  Fluoroscopy Time:  1 minutes 42 seconds Radiation Exposure Index (if provided by the fluoroscopic device): 72.3  mGy Number of Acquired Spot Images: 1 PROCEDURE: After discussing the risks and benefits of this procedure with the patient informed consent was obtained. The back was sterilely prepped and draped. Following local  anesthesia with 1% lidocaine a 22 gauge spinal needle was advanced into the lumbar spine thecal sac at the L4-L5 level under fluoroscopic guidance . 6 cc of CSF obtained. Initial CSF was slightly blood-tinged, this cleared. CSF was sent to the lab for further evaluation. Hemostasis achieved following needle removal. There no complications. IMPRESSION: Successful fluoroscopically directed lumbar puncture. CSF samples sent to the laboratory for further evaluation. Electronically Signed   By: Maisie Fus  Register   On: 11/07/2018 13:39     CBC Recent Labs  Lab 11/07/18 1005 11/08/18 0427  WBC 10.7* 10.1  HGB 14.2 12.1*  HCT 43.0 37.1*  PLT 117* 96*  MCV 82.4 83.2  MCH 27.2 27.1  MCHC 33.0 32.6  RDW 14.1 14.5  LYMPHSABS 1.0  --   MONOABS 0.5  --   EOSABS 0.1  --   BASOSABS 0.1  --     Chemistries  Recent Labs  Lab 11/07/18 1005 11/08/18 0427  NA 131* 136  K 3.9 3.8  CL 95* 105  CO2 17* 24  GLUCOSE 206* 193*  BUN 25* 23  CREATININE 1.82* 1.41*  CALCIUM 8.7* 8.3*  MG  --  2.1  AST 44*  --   ALT 27  --   ALKPHOS 61  --   BILITOT 1.3*  --    ------------------------------------------------------------------------------------------------------------------ estimated creatinine clearance is 71.9 mL/min (A) (by C-G formula based on SCr of 1.41 mg/dL (H)). ------------------------------------------------------------------------------------------------------------------ Recent Labs    11/07/18 1005  HGBA1C 6.7*   ------------------------------------------------------------------------------------------------------------------ No results for input(s): CHOL, HDL, LDLCALC, TRIG, CHOLHDL, LDLDIRECT in the last 72 hours. ------------------------------------------------------------------------------------------------------------------ Recent Labs    11/08/18 0427  TSH 0.522    ------------------------------------------------------------------------------------------------------------------ No results for input(s): VITAMINB12, FOLATE, FERRITIN, TIBC, IRON, RETICCTPCT in the last 72 hours.  Coagulation profile Recent Labs  Lab 11/07/18 1005  INR 1.2    No results for input(s): DDIMER in the last 72 hours.  Cardiac Enzymes No results for input(s): CKMB, TROPONINI, MYOGLOBIN in the last 168 hours.  Invalid input(s): CK ------------------------------------------------------------------------------------------------------------------ Invalid input(s): POCBNP    Assessment & Plan  Patient is a 63 year old male with history of hypertension,borderline diabetes mellitus,hyperlipidemia,obstructive sleep apnea for which patient uses CPAP at night and hypothyroidism being admitted for suspected meningitis and resolved neurological symptoms.  1.   Acute respiratory failure will obtain a stat CT per chest. I have discussed the case with the pulmonary critical care MD who is seen the patient. Obtain stat EKG as well as cardiac enzyme Follow-up on the echo Continue aspirin Add Xopenex to current treatment     Code Status Orders  (From admission, onward)         Start     Ordered   11/07/18 1436  Full code  Continuous     11/07/18 1436        Code Status History    This patient has a current code status but no historical code status.   Advance Care Planning Activity           Consults pulmonary critical care  DVT Prophylaxis Lovenox  Lab Results  Component Value Date   PLT 96 (L) 11/08/2018     Time Spent in minutes 35 minutes  Greater than 50% of time spent in care coordination and counseling patient regarding  the condition and plan of care.   Auburn Bilberry M.D on 11/08/2018 at 12:51 PM  Between 7am to 6pm - Pager - [phone]  After 6pm go to [email or web address] - Social research officer, government  [employer]   Office   [phone]

## 2018-11-09 ENCOUNTER — Inpatient Hospital Stay: Payer: BC Managed Care – PPO | Admitting: Anesthesiology

## 2018-11-09 ENCOUNTER — Encounter: Payer: Self-pay | Admitting: Anesthesiology

## 2018-11-09 ENCOUNTER — Encounter
Admission: EM | Disposition: A | Discharge status: Home Health | Payer: Self-pay | Source: Home / Self Care | Attending: Internal Medicine

## 2018-11-09 HISTORY — PX: IRRIGATION AND DEBRIDEMENT FOOT: SHX6602

## 2018-11-09 LAB — BASIC METABOLIC PANEL
Anion gap: 10 (ref 5–15)
BUN: 20 mg/dL (ref 8–23)
CO2: 25 mmol/L (ref 22–32)
Calcium: 8.3 mg/dL — ABNORMAL LOW (ref 8.9–10.3)
Chloride: 101 mmol/L (ref 98–111)
Creatinine, Ser: 1.21 mg/dL (ref 0.61–1.24)
GFR calc Af Amer: >60 mL/min (ref >60)
GFR calc non Af Amer: >60 mL/min (ref >60)
Glucose, Bld: 157 mg/dL — ABNORMAL HIGH (ref 70–99)
Potassium: 3.2 mmol/L — ABNORMAL LOW (ref 3.5–5.1)
Sodium: 136 mmol/L (ref 135–145)

## 2018-11-09 LAB — URINE CULTURE: Culture: NO GROWTH

## 2018-11-09 LAB — HEPARIN LEVEL (UNFRACTIONATED)
Heparin Unfractionated: <0.1 IU/mL — ABNORMAL LOW (ref 0.30–0.70)
Heparin Unfractionated: <0.1 IU/mL — ABNORMAL LOW (ref 0.30–0.70)

## 2018-11-09 LAB — LIPID PANEL
Cholesterol: 120 mg/dL (ref 0–200)
HDL: 12 mg/dL — ABNORMAL LOW (ref >40)
LDL Cholesterol: 60 mg/dL (ref 0–99)
Total CHOL/HDL Ratio: 10 RATIO
Triglycerides: 239 mg/dL — ABNORMAL HIGH (ref <150)
VLDL: 48 mg/dL — ABNORMAL HIGH (ref 0–40)

## 2018-11-09 LAB — CBC
HCT: 35.2 % — ABNORMAL LOW (ref 39.0–52.0)
Hemoglobin: 11.6 g/dL — ABNORMAL LOW (ref 13.0–17.0)
MCH: 26.9 pg (ref 26.0–34.0)
MCHC: 33 g/dL (ref 30.0–36.0)
MCV: 81.5 fL (ref 80.0–100.0)
Platelets: 99 10*3/uL — ABNORMAL LOW (ref 150–400)
RBC: 4.32 MIL/uL (ref 4.22–5.81)
RDW: 14.5 % (ref 11.5–15.5)
WBC: 8.2 10*3/uL (ref 4.0–10.5)
nRBC: 0 % (ref 0.0–0.2)

## 2018-11-09 LAB — ECHOCARDIOGRAM COMPLETE
Height: 72 in
Weight: 4257.52 oz

## 2018-11-09 LAB — GLUCOSE, CAPILLARY
Glucose-Capillary: 118 mg/dL — ABNORMAL HIGH (ref 70–99)
Glucose-Capillary: 149 mg/dL — ABNORMAL HIGH (ref 70–99)
Glucose-Capillary: 111 mg/dL — ABNORMAL HIGH (ref 70–99)
Glucose-Capillary: 103 mg/dL — ABNORMAL HIGH (ref 70–99)

## 2018-11-09 LAB — PROCALCITONIN: Procalcitonin: 10.25 ng/mL

## 2018-11-09 SURGERY — IRRIGATION AND DEBRIDEMENT FOOT
Anesthesia: General | Laterality: Left

## 2018-11-09 MED ORDER — FENTANYL CITRATE (PF) 100 MCG/2ML IJ SOLN: 25.0000 ug | INTRAMUSCULAR | Status: DC | PRN

## 2018-11-09 MED ORDER — FENTANYL CITRATE (PF) 100 MCG/2ML IJ SOLN
INTRAMUSCULAR | Status: DC | PRN
  Administered 2018-11-09 (×4): 25 ug via INTRAVENOUS

## 2018-11-09 MED ORDER — POTASSIUM CHLORIDE CRYS ER 20 MEQ PO TBCR
40.0000 meq | EXTENDED_RELEASE_TABLET | Freq: Once | ORAL | Status: AC
  Administered 2018-11-09: 11:00:00 40 meq via ORAL
  Filled 2018-11-09: qty 2

## 2018-11-09 MED ORDER — FENTANYL CITRATE (PF) 100 MCG/2ML IJ SOLN
INTRAMUSCULAR | Status: AC
  Filled 2018-11-09: qty 2

## 2018-11-09 MED ORDER — LACTATED RINGERS IV SOLN
INTRAVENOUS | Status: DC | PRN
  Administered 2018-11-09: 16:00:00 via INTRAVENOUS

## 2018-11-09 MED ORDER — OXYCODONE HCL 5 MG PO TABS: 5.0000 mg | ORAL_TABLET | Freq: Once | ORAL | Status: DC | PRN

## 2018-11-09 MED ORDER — BUPIVACAINE HCL (PF) 0.5 % IJ SOLN
INTRAMUSCULAR | Status: DC | PRN
  Administered 2018-11-09: 10 mL

## 2018-11-09 MED ORDER — PROPOFOL 10 MG/ML IV BOLUS
INTRAVENOUS | Status: AC
  Filled 2018-11-09: qty 20

## 2018-11-09 MED ORDER — MORPHINE SULFATE (PF) 2 MG/ML IV SOLN: 2.0000 mg | INTRAVENOUS | Status: DC | PRN

## 2018-11-09 MED ORDER — OXYCODONE-ACETAMINOPHEN 5-325 MG PO TABS
1.0000 | ORAL_TABLET | ORAL | Status: DC | PRN
  Administered 2018-11-09 – 2018-11-10 (×2): 1 via ORAL
  Administered 2018-11-10: 09:00:00 2 via ORAL
  Filled 2018-11-09: qty 1
  Filled 2018-11-09: qty 2
  Filled 2018-11-09: qty 1

## 2018-11-09 MED ORDER — HEPARIN BOLUS VIA INFUSION
3000.0000 [IU] | Freq: Once | INTRAVENOUS | Status: AC
  Administered 2018-11-09: 01:00:00 3000 [IU] via INTRAVENOUS
  Filled 2018-11-09: qty 3000

## 2018-11-09 MED ORDER — PIPERACILLIN-TAZOBACTAM 3.375 G IVPB
3.3750 g | Freq: Three times a day (TID) | INTRAVENOUS | Status: DC
  Administered 2018-11-09 – 2018-11-10 (×3): 3.375 g via INTRAVENOUS
  Filled 2018-11-09 (×3): qty 50

## 2018-11-09 MED ORDER — LEVALBUTEROL HCL 1.25 MG/0.5ML IN NEBU: 1.2500 mg | INHALATION_SOLUTION | Freq: Four times a day (QID) | RESPIRATORY_TRACT | Status: DC | PRN

## 2018-11-09 MED ORDER — VANCOMYCIN HCL 1000 MG IV SOLR
INTRAVENOUS | Status: AC
  Filled 2018-11-09: qty 1000

## 2018-11-09 MED ORDER — OXYCODONE HCL 5 MG/5ML PO SOLN: 5.0000 mg | Freq: Once | ORAL | Status: DC | PRN

## 2018-11-09 MED ORDER — PROPOFOL 10 MG/ML IV BOLUS
INTRAVENOUS | Status: DC | PRN
  Administered 2018-11-09: 200 mg via INTRAVENOUS

## 2018-11-09 MED ORDER — PIPERACILLIN-TAZOBACTAM 3.375 G IVPB: 3.3750 g | Freq: Three times a day (TID) | INTRAVENOUS | Status: DC

## 2018-11-09 MED ORDER — SODIUM CHLORIDE 0.9 % IV SOLN
INTRAVENOUS | Status: DC
  Administered 2018-11-09: 16:00:00 50 mL/h via INTRAVENOUS

## 2018-11-09 MED ORDER — BUPIVACAINE HCL (PF) 0.5 % IJ SOLN
INTRAMUSCULAR | Status: AC
  Filled 2018-11-09: qty 30

## 2018-11-09 SURGICAL SUPPLY — 52 items
"PENCIL ELECTRO HAND CTR " (MISCELLANEOUS) ×1 IMPLANT
BLADE OSCILLATING/SAGITTAL (BLADE)
BLADE SURG 15 STRL LF DISP TIS (BLADE) ×1 IMPLANT
BLADE SURG 15 STRL SS (BLADE) ×2
BLADE SW THK.38XMED LNG THN (BLADE) IMPLANT
BNDG CONFORM 2 STRL LF (GAUZE/BANDAGES/DRESSINGS) ×3 IMPLANT
BNDG ELASTIC 4X5.8 VLCR NS LF (GAUZE/BANDAGES/DRESSINGS) ×3 IMPLANT
BNDG ESMARK 4X12 TAN STRL LF (GAUZE/BANDAGES/DRESSINGS) ×3 IMPLANT
BNDG GAUZE 4.5X4.1 6PLY STRL (MISCELLANEOUS) ×3 IMPLANT
CANISTER SUCT 1200ML W/VALVE (MISCELLANEOUS) ×3 IMPLANT
COVER WAND RF STERILE (DRAPES) ×3 IMPLANT
CUFF TOURN SGL QUICK 12 (TOURNIQUET CUFF) IMPLANT
CUFF TOURN SGL QUICK 18X4 (TOURNIQUET CUFF) IMPLANT
DRAPE FLUOR MINI C-ARM 54X84 (DRAPES) IMPLANT
DURAPREP 26ML APPLICATOR (WOUND CARE) ×3 IMPLANT
ELECT REM PT RETURN 9FT ADLT (ELECTROSURGICAL) ×3
ELECTRODE REM PT RTRN 9FT ADLT (ELECTROSURGICAL) ×1 IMPLANT
GAUZE PACKING IODOFORM 1/2 (PACKING) ×2 IMPLANT
GAUZE SPONGE 4X4 12PLY STRL (GAUZE/BANDAGES/DRESSINGS) ×3 IMPLANT
GAUZE XEROFORM 1X8 LF (GAUZE/BANDAGES/DRESSINGS) ×3 IMPLANT
GLOVE BIO SURGEON STRL SZ7.5 (GLOVE) ×3 IMPLANT
GLOVE INDICATOR 8.0 STRL GRN (GLOVE) ×3 IMPLANT
GOWN STRL REUS W/ TWL LRG LVL3 (GOWN DISPOSABLE) ×2 IMPLANT
GOWN STRL REUS W/TWL LRG LVL3 (GOWN DISPOSABLE) ×4
HANDPIECE VERSAJET DEBRIDEMENT (MISCELLANEOUS) ×3 IMPLANT
KIT TURNOVER KIT A (KITS) ×3 IMPLANT
LABEL OR SOLS (LABEL) ×3 IMPLANT
NDL FILTER BLUNT 18X1 1/2 (NEEDLE) ×1 IMPLANT
NDL HYPO 25X1 1.5 SAFETY (NEEDLE) ×3 IMPLANT
NEEDLE FILTER BLUNT 18X 1/2SAF (NEEDLE) ×2
NEEDLE FILTER BLUNT 18X1 1/2 (NEEDLE) ×1 IMPLANT
NEEDLE HYPO 25X1 1.5 SAFETY (NEEDLE) ×9 IMPLANT
NS IRRIG 500ML POUR BTL (IV SOLUTION) ×3 IMPLANT
PACK EXTREMITY ARMC (MISCELLANEOUS) ×3 IMPLANT
PAD ABD DERMACEA PRESS 5X9 (GAUZE/BANDAGES/DRESSINGS) ×6 IMPLANT
PENCIL ELECTRO HAND CTR (MISCELLANEOUS) ×3 IMPLANT
RASP SM TEAR CROSS CUT (RASP) IMPLANT
SOL PREP PVP 2OZ (MISCELLANEOUS) ×3
SOLUTION PREP PVP 2OZ (MISCELLANEOUS) ×1 IMPLANT
STOCKINETTE 48X4 2 PLY STRL (GAUZE/BANDAGES/DRESSINGS) ×1 IMPLANT
STOCKINETTE STRL 4IN 9604848 (GAUZE/BANDAGES/DRESSINGS) ×3 IMPLANT
STOCKINETTE STRL 6IN 960660 (GAUZE/BANDAGES/DRESSINGS) ×3 IMPLANT
SUT ETHILON 3-0 FS-10 30 BLK (SUTURE) ×3
SUT ETHILON 4-0 (SUTURE) ×2
SUT ETHILON 4-0 FS2 18XMFL BLK (SUTURE) ×1
SUT VIC AB 3-0 SH 27 (SUTURE) ×2
SUT VIC AB 3-0 SH 27X BRD (SUTURE) ×1 IMPLANT
SUT VIC AB 4-0 FS2 27 (SUTURE) ×3 IMPLANT
SUTURE EHLN 3-0 FS-10 30 BLK (SUTURE) ×1 IMPLANT
SUTURE ETHLN 4-0 FS2 18XMF BLK (SUTURE) ×1 IMPLANT
SYR 10ML LL (SYRINGE) ×6 IMPLANT
SYR 3ML LL SCALE MARK (SYRINGE) ×3 IMPLANT

## 2018-11-09 NOTE — H&P (View-Only) (Signed)
 Subjective/Chief Complaint: Patient seen.  States he is feeling much better today.   Objective: Vital signs in last 24 hours: Temp:  [98.3 F (36.8 C)-99 F (37.2 C)] 98.3 F (36.8 C) (10/18 0611) Pulse Rate:  [75-95] 75 (10/18 0611) Resp:  [16-26] 16 (10/18 0611) BP: (121-139)/(74-89) 121/81 (10/18 0611) SpO2:  [95 %-99 %] 97 % (10/18 0611) Last BM Date: 11/08/18  Intake/Output from previous day: 10/17 0701 - 10/18 0700 In: 547 [P.O.:290; I.V.:157; IV Piggyback:100] Out: 2350 [Urine:2350] Intake/Output this shift: Total I/O In: -  Out: 675 [Urine:675]  Some mild drainage is noted on the bandaging.  Upon removal there is still significant erythema and edema in the forefoot.  Purulence is still expressed from the full-thickness portion of the ulceration plantarly.  MRI did not clearly show any abscess or sign of bone or joint involvement.  Lab Results:  Recent Labs    11/08/18 0427 11/09/18 0447  WBC 10.1 8.2  HGB 12.1* 11.6*  HCT 37.1* 35.2*  PLT 96* 99*   BMET Recent Labs    11/08/18 0427 11/09/18 0447  NA 136 136  K 3.8 3.2*  CL 105 101  CO2 24 25  GLUCOSE 193* 157*  BUN 23 20  CREATININE 1.41* 1.21  CALCIUM 8.3* 8.3*   PT/INR Recent Labs    11/07/18 1005  LABPROT 15.3*  INR 1.2   ABG Recent Labs    11/08/18 1045  PHART 7.46*  HCO3 21.3    Studies/Results: Ct Angio Chest Pe W Or Wo Contrast  Result Date: 11/08/2018 CLINICAL DATA:  Increased shortness of breath EXAM: CT ANGIOGRAPHY CHEST WITH CONTRAST TECHNIQUE: Multidetector CT imaging of the chest was performed using the standard protocol during bolus administration of intravenous contrast. Multiplanar CT image reconstructions and MIPs were obtained to evaluate the vascular anatomy. CONTRAST:  150mL OMNIPAQUE IOHEXOL 350 MG/ML SOLN COMPARISON:  CT chest dated 11/07/2018 FINDINGS: Cardiovascular: Initial contrast bolus demonstrates suboptimal contrast opacification, specifically in the  bilateral lower lobes. Subsequent contrast bolus demonstrates improved contrast opacification to the lobar level. However, evaluation of the bilateral pulmonary arteries is constrained by respiratory motion, leading to artifact, involving the lung apices and bilateral lower lobes. Within that constraint, there is no evidence of pulmonary embolism. The heart is normal in size. No pericardial effusion. No evidence thoracic aortic aneurysm. Atherosclerotic calcifications of the aortic arch. Mild coronary atherosclerosis of the LAD. Mediastinum/Nodes: No suspicious mediastinal lymphadenopathy. 8 mm short axis subcarinal node. Visualized left thyroid is unremarkable. Right thyroid is not visualized, presumably surgically absent. Lungs/Pleura: Evaluation of the lung parenchyma is constrained by respiratory motion. Within that constraint, there no suspicious pulmonary nodules. Scattered areas of ground-glass opacity likely reflect expiratory imaging. This appearance is not considered suspicious for interstitial edema. No focal consolidation. No pleural effusion or pneumothorax. Upper Abdomen: Visualized upper abdomen is grossly unremarkable. Musculoskeletal: Degenerative changes of the visualized thoracolumbar spine. Review of the MIP images confirms the above findings. IMPRESSION: Limited evaluation, as above. No evidence of pulmonary embolism. When accounting for respiratory motion, there is no interval change from recent CT. Aortic Atherosclerosis (ICD10-I70.0). Electronically Signed   By: Sriyesh  Krishnan M.D.   On: 11/08/2018 12:10   Mr Foot Left Wo Contrast  Result Date: 11/08/2018 CLINICAL DATA:  Foot swelling, diabetic, swelling and redness EXAM: MRI OF THE LEFT FOOT WITHOUT CONTRAST TECHNIQUE: Multiplanar, multisequence MR imaging of the left forefoot was performed. No intravenous contrast was administered. COMPARISON:  Radiograph same day FINDINGS: Bones/Joint/Cartilage Normal   marrow signal is seen  throughout. No areas of periosteal reaction, cortical destruction or a vascular necrosis. No fractures seen. No large joint effusions are noted. Mild first MTP joint osteoarthritis is noted. Ligaments The Lisfranc ligament is intact. The collateral ligaments appear to be intact. Muscles and Tendons There is increased feathery signal seen within the abductor and adductor hallucis muscle belly and flexor digitorum brevis. There is small amount of fluid seen surrounding it extending to the myotendinous junction of the abductor hallucis muscle. There is also mildly increased feathery signal seen within the plantar surface of the intrinsic musculature. The flexor and extensor tendons appear to be intact. Soft tissues Diffuse dorsal soft tissue edema is noted. No focal fluid collection or sinus tract is seen. There is mild skin thickening seen along the plantar medial surface of the forefoot. IMPRESSION: 1. Myositis/muscular strain involving the abductor and adductor hallucis muscle bellies as well as the flexor digitorum brevis. 2. There is also mild muscular edema seen within the intrinsic muscles of the forefoot. 3. No evidence of osteomyelitis or soft tissue abscess. Electronically Signed   By: Prudencio Pair M.D.   On: 11/08/2018 19:50   Dg Foot Complete Left  Result Date: 11/08/2018 CLINICAL DATA:  63 year old male with history of ulcer on the left foot. EXAM: LEFT FOOT - COMPLETE 3+ VIEW COMPARISON:  No priors. FINDINGS: There is no evidence of fracture or dislocation. There is no evidence of inflammatory arthropathy or other focal bone abnormality. Soft tissues are unremarkable. IMPRESSION: Negative. Electronically Signed   By: Vinnie Langton M.D.   On: 11/08/2018 11:43   Dg Lumbar Puncture Fluoro Guide  Result Date: 11/07/2018 CLINICAL DATA:  Meningitis.  Fever and stroke symptoms. EXAM: DIAGNOSTIC LUMBAR PUNCTURE UNDER FLUOROSCOPIC GUIDANCE FLUOROSCOPY TIME:  Fluoroscopy Time:  1 minutes 42 seconds  Radiation Exposure Index (if provided by the fluoroscopic device): 72.3 mGy Number of Acquired Spot Images: 1 PROCEDURE: After discussing the risks and benefits of this procedure with the patient informed consent was obtained. The back was sterilely prepped and draped. Following local anesthesia with 1% lidocaine a 22 gauge spinal needle was advanced into the lumbar spine thecal sac at the L4-L5 level under fluoroscopic guidance . 6 cc of CSF obtained. Initial CSF was slightly blood-tinged, this cleared. CSF was sent to the lab for further evaluation. Hemostasis achieved following needle removal. There no complications. IMPRESSION: Successful fluoroscopically directed lumbar puncture. CSF samples sent to the laboratory for further evaluation. Electronically Signed   By: Marcello Moores  Register   On: 11/07/2018 13:39    Anti-infectives: Anti-infectives (From admission, onward)   Start     Dose/Rate Route Frequency Ordered Stop   11/09/18 1200  piperacillin-tazobactam (ZOSYN) IVPB 3.375 g     3.375 g 12.5 mL/hr over 240 Minutes Intravenous Every 8 hours 11/09/18 1114     11/08/18 1445  cefTRIAXone (ROCEPHIN) 2 g in sodium chloride 0.9 % 100 mL IVPB  Status:  Discontinued     2 g 200 mL/hr over 30 Minutes Intravenous Every 24 hours 11/07/18 1739 11/09/18 1114   11/08/18 1200  vancomycin (VANCOCIN) 1,500 mg in sodium chloride 0.9 % 500 mL IVPB  Status:  Discontinued     1,500 mg 250 mL/hr over 120 Minutes Intravenous Every 24 hours 11/08/18 0817 11/08/18 1028   11/08/18 1000  vancomycin (VANCOCIN) 1,250 mg in sodium chloride 0.9 % 250 mL IVPB  Status:  Discontinued     1,250 mg 166.7 mL/hr over 90 Minutes Intravenous  Every 24 hours 11/07/18 1503 11/08/18 0817   11/07/18 1445  cefTRIAXone (ROCEPHIN) 2 g in sodium chloride 0.9 % 100 mL IVPB  Status:  Discontinued     2 g 200 mL/hr over 30 Minutes Intravenous Every 12 hours 11/07/18 1439 11/07/18 1739   11/07/18 1215  vancomycin (VANCOCIN) 1,250 mg in sodium  chloride 0.9 % 250 mL IVPB     1,250 mg 166.7 mL/hr over 90 Minutes Intravenous  Once 11/07/18 1202 11/07/18 1606   11/07/18 1115  cefTRIAXone (ROCEPHIN) 2 g in sodium chloride 0.9 % 100 mL IVPB     2 g 200 mL/hr over 30 Minutes Intravenous  Once 11/07/18 1102 11/07/18 1205   11/07/18 1115  vancomycin (VANCOCIN) IVPB 1000 mg/200 mL premix     1,000 mg 200 mL/hr over 60 Minutes Intravenous  Once 11/07/18 1102 11/07/18 1314      Assessment/Plan: s/p * No surgery found * Assessment: Cellulitis with abscess left forefoot.   Plan: Due to the continued deep purulence in the first interspace discussed with the patient and his wife an I&D to open up the area and drain the abscess and explore the wound.  We did discuss possible risks and complications of the procedure including inability of the wound to heal due to continued infection or his diabetes.  Discussed the possibility of repeat debridement if continued problems.  We will need to leave the wound open with packing for drainage of the wound.  Questions invited and answered.  Consent form for incision and drainage abscess left foot.  N.p.o.  Plan for surgery later this afternoon.  LOS: 2 days    Ricci Barker 11/09/2018

## 2018-11-09 NOTE — Anesthesia Postprocedure Evaluation (Signed)
Anesthesia Post Note  Patient: Jacob Lawson  Procedure(s) Performed: IRRIGATION AND DEBRIDEMENT FOOT (Left )  Patient location during evaluation: PACU Anesthesia Type: General Level of consciousness: awake and alert Pain management: pain level controlled Vital Signs Assessment: post-procedure vital signs reviewed and stable Respiratory status: spontaneous breathing, nonlabored ventilation, respiratory function stable and patient connected to nasal cannula oxygen Cardiovascular status: blood pressure returned to baseline and stable Postop Assessment: no apparent nausea or vomiting Anesthetic complications: no     Last Vitals:  Vitals:   11/09/18 1728 11/09/18 1810  BP: 125/81 131/86  Pulse: 77 72  Resp: (!) 23 18  Temp:  37.1 C  SpO2: 94% 98%    Last Pain:  Vitals:   11/09/18 1810  TempSrc: Oral  PainSc:                  Jacob Lawson

## 2018-11-09 NOTE — Transfer of Care (Signed)
Immediate Anesthesia Transfer of Care Note  Patient: Jacob Lawson  Procedure(s) Performed: IRRIGATION AND DEBRIDEMENT FOOT (Left )  Patient Location: PACU  Anesthesia Type:General  Level of Consciousness: sedated  Airway & Oxygen Therapy: Patient Spontanous Breathing and Patient connected to nasal cannula oxygen  Post-op Assessment: Report given to RN and Post -op Vital signs reviewed and stable  Post vital signs: Reviewed and stable  Last Vitals:  Vitals Value Taken Time  BP 131/86 11/09/18 1810  Temp 37.1 C 11/09/18 1810  Pulse 72 11/09/18 1810  Resp 18 11/09/18 1810  SpO2 98 % 11/09/18 1810    Last Pain:  Vitals:   11/09/18 1810  TempSrc: Oral  PainSc:          Complications: No apparent anesthesia complications

## 2018-11-09 NOTE — Progress Notes (Signed)
Subjective/Chief Complaint: Patient seen.  States he is feeling much better today.   Objective: Vital signs in last 24 hours: Temp:  [98.3 F (36.8 C)-99 F (37.2 C)] 98.3 F (36.8 C) (10/18 0611) Pulse Rate:  [75-95] 75 (10/18 0611) Resp:  [16-26] 16 (10/18 0611) BP: (121-139)/(74-89) 121/81 (10/18 0611) SpO2:  [95 %-99 %] 97 % (10/18 0611) Last BM Date: 11/08/18  Intake/Output from previous day: 10/17 0701 - 10/18 0700 In: 547 [P.O.:290; I.V.:157; IV Piggyback:100] Out: 2350 [Urine:2350] Intake/Output this shift: Total I/O In: -  Out: 675 [Urine:675]  Some mild drainage is noted on the bandaging.  Upon removal there is still significant erythema and edema in the forefoot.  Purulence is still expressed from the full-thickness portion of the ulceration plantarly.  MRI did not clearly show any abscess or sign of bone or joint involvement.  Lab Results:  Recent Labs    11/08/18 0427 11/09/18 0447  WBC 10.1 8.2  HGB 12.1* 11.6*  HCT 37.1* 35.2*  PLT 96* 99*   BMET Recent Labs    11/08/18 0427 11/09/18 0447  NA 136 136  K 3.8 3.2*  CL 105 101  CO2 24 25  GLUCOSE 193* 157*  BUN 23 20  CREATININE 1.41* 1.21  CALCIUM 8.3* 8.3*   PT/INR Recent Labs    11/07/18 1005  LABPROT 15.3*  INR 1.2   ABG Recent Labs    11/08/18 1045  PHART 7.46*  HCO3 21.3    Studies/Results: Ct Angio Chest Pe W Or Wo Contrast  Result Date: 11/08/2018 CLINICAL DATA:  Increased shortness of breath EXAM: CT ANGIOGRAPHY CHEST WITH CONTRAST TECHNIQUE: Multidetector CT imaging of the chest was performed using the standard protocol during bolus administration of intravenous contrast. Multiplanar CT image reconstructions and MIPs were obtained to evaluate the vascular anatomy. CONTRAST:  150mL OMNIPAQUE IOHEXOL 350 MG/ML SOLN COMPARISON:  CT chest dated 11/07/2018 FINDINGS: Cardiovascular: Initial contrast bolus demonstrates suboptimal contrast opacification, specifically in the  bilateral lower lobes. Subsequent contrast bolus demonstrates improved contrast opacification to the lobar level. However, evaluation of the bilateral pulmonary arteries is constrained by respiratory motion, leading to artifact, involving the lung apices and bilateral lower lobes. Within that constraint, there is no evidence of pulmonary embolism. The heart is normal in size. No pericardial effusion. No evidence thoracic aortic aneurysm. Atherosclerotic calcifications of the aortic arch. Mild coronary atherosclerosis of the LAD. Mediastinum/Nodes: No suspicious mediastinal lymphadenopathy. 8 mm short axis subcarinal node. Visualized left thyroid is unremarkable. Right thyroid is not visualized, presumably surgically absent. Lungs/Pleura: Evaluation of the lung parenchyma is constrained by respiratory motion. Within that constraint, there no suspicious pulmonary nodules. Scattered areas of ground-glass opacity likely reflect expiratory imaging. This appearance is not considered suspicious for interstitial edema. No focal consolidation. No pleural effusion or pneumothorax. Upper Abdomen: Visualized upper abdomen is grossly unremarkable. Musculoskeletal: Degenerative changes of the visualized thoracolumbar spine. Review of the MIP images confirms the above findings. IMPRESSION: Limited evaluation, as above. No evidence of pulmonary embolism. When accounting for respiratory motion, there is no interval change from recent CT. Aortic Atherosclerosis (ICD10-I70.0). Electronically Signed   By: Charline BillsSriyesh  Krishnan M.D.   On: 11/08/2018 12:10   Mr Foot Left Wo Contrast  Result Date: 11/08/2018 CLINICAL DATA:  Foot swelling, diabetic, swelling and redness EXAM: MRI OF THE LEFT FOOT WITHOUT CONTRAST TECHNIQUE: Multiplanar, multisequence MR imaging of the left forefoot was performed. No intravenous contrast was administered. COMPARISON:  Radiograph same day FINDINGS: Bones/Joint/Cartilage Normal  marrow signal is seen  throughout. No areas of periosteal reaction, cortical destruction or a vascular necrosis. No fractures seen. No large joint effusions are noted. Mild first MTP joint osteoarthritis is noted. Ligaments The Lisfranc ligament is intact. The collateral ligaments appear to be intact. Muscles and Tendons There is increased feathery signal seen within the abductor and adductor hallucis muscle belly and flexor digitorum brevis. There is small amount of fluid seen surrounding it extending to the myotendinous junction of the abductor hallucis muscle. There is also mildly increased feathery signal seen within the plantar surface of the intrinsic musculature. The flexor and extensor tendons appear to be intact. Soft tissues Diffuse dorsal soft tissue edema is noted. No focal fluid collection or sinus tract is seen. There is mild skin thickening seen along the plantar medial surface of the forefoot. IMPRESSION: 1. Myositis/muscular strain involving the abductor and adductor hallucis muscle bellies as well as the flexor digitorum brevis. 2. There is also mild muscular edema seen within the intrinsic muscles of the forefoot. 3. No evidence of osteomyelitis or soft tissue abscess. Electronically Signed   By: Prudencio Pair M.D.   On: 11/08/2018 19:50   Dg Foot Complete Left  Result Date: 11/08/2018 CLINICAL DATA:  63 year old male with history of ulcer on the left foot. EXAM: LEFT FOOT - COMPLETE 3+ VIEW COMPARISON:  No priors. FINDINGS: There is no evidence of fracture or dislocation. There is no evidence of inflammatory arthropathy or other focal bone abnormality. Soft tissues are unremarkable. IMPRESSION: Negative. Electronically Signed   By: Vinnie Langton M.D.   On: 11/08/2018 11:43   Dg Lumbar Puncture Fluoro Guide  Result Date: 11/07/2018 CLINICAL DATA:  Meningitis.  Fever and stroke symptoms. EXAM: DIAGNOSTIC LUMBAR PUNCTURE UNDER FLUOROSCOPIC GUIDANCE FLUOROSCOPY TIME:  Fluoroscopy Time:  1 minutes 42 seconds  Radiation Exposure Index (if provided by the fluoroscopic device): 72.3 mGy Number of Acquired Spot Images: 1 PROCEDURE: After discussing the risks and benefits of this procedure with the patient informed consent was obtained. The back was sterilely prepped and draped. Following local anesthesia with 1% lidocaine a 22 gauge spinal needle was advanced into the lumbar spine thecal sac at the L4-L5 level under fluoroscopic guidance . 6 cc of CSF obtained. Initial CSF was slightly blood-tinged, this cleared. CSF was sent to the lab for further evaluation. Hemostasis achieved following needle removal. There no complications. IMPRESSION: Successful fluoroscopically directed lumbar puncture. CSF samples sent to the laboratory for further evaluation. Electronically Signed   By: Marcello Moores  Register   On: 11/07/2018 13:39    Anti-infectives: Anti-infectives (From admission, onward)   Start     Dose/Rate Route Frequency Ordered Stop   11/09/18 1200  piperacillin-tazobactam (ZOSYN) IVPB 3.375 g     3.375 g 12.5 mL/hr over 240 Minutes Intravenous Every 8 hours 11/09/18 1114     11/08/18 1445  cefTRIAXone (ROCEPHIN) 2 g in sodium chloride 0.9 % 100 mL IVPB  Status:  Discontinued     2 g 200 mL/hr over 30 Minutes Intravenous Every 24 hours 11/07/18 1739 11/09/18 1114   11/08/18 1200  vancomycin (VANCOCIN) 1,500 mg in sodium chloride 0.9 % 500 mL IVPB  Status:  Discontinued     1,500 mg 250 mL/hr over 120 Minutes Intravenous Every 24 hours 11/08/18 0817 11/08/18 1028   11/08/18 1000  vancomycin (VANCOCIN) 1,250 mg in sodium chloride 0.9 % 250 mL IVPB  Status:  Discontinued     1,250 mg 166.7 mL/hr over 90 Minutes Intravenous  Every 24 hours 11/07/18 1503 11/08/18 0817   11/07/18 1445  cefTRIAXone (ROCEPHIN) 2 g in sodium chloride 0.9 % 100 mL IVPB  Status:  Discontinued     2 g 200 mL/hr over 30 Minutes Intravenous Every 12 hours 11/07/18 1439 11/07/18 1739   11/07/18 1215  vancomycin (VANCOCIN) 1,250 mg in sodium  chloride 0.9 % 250 mL IVPB     1,250 mg 166.7 mL/hr over 90 Minutes Intravenous  Once 11/07/18 1202 11/07/18 1606   11/07/18 1115  cefTRIAXone (ROCEPHIN) 2 g in sodium chloride 0.9 % 100 mL IVPB     2 g 200 mL/hr over 30 Minutes Intravenous  Once 11/07/18 1102 11/07/18 1205   11/07/18 1115  vancomycin (VANCOCIN) IVPB 1000 mg/200 mL premix     1,000 mg 200 mL/hr over 60 Minutes Intravenous  Once 11/07/18 1102 11/07/18 1314      Assessment/Plan: s/p * No surgery found * Assessment: Cellulitis with abscess left forefoot.   Plan: Due to the continued deep purulence in the first interspace discussed with the patient and his wife an I&D to open up the area and drain the abscess and explore the wound.  We did discuss possible risks and complications of the procedure including inability of the wound to heal due to continued infection or his diabetes.  Discussed the possibility of repeat debridement if continued problems.  We will need to leave the wound open with packing for drainage of the wound.  Questions invited and answered.  Consent form for incision and drainage abscess left foot.  N.p.o.  Plan for surgery later this afternoon.  LOS: 2 days    Ricci Barker 11/09/2018

## 2018-11-09 NOTE — Consult Note (Signed)
ANTICOAGULATION CONSULT NOTE - Initial Consult  Pharmacy Consult for Heparin Drip Indication: chest pain/ACS/STEMI  Allergies  Allergen Reactions  . Lamictal [Lamotrigine] Hives  . Statins     Muscle/joint aches   Patient Measurements: Height: 6' (182.9 cm) Weight: 266 lb 1.5 oz (120.7 kg) IBW/kg (Calculated) : 77.6 Heparin Dosing Weight: 104.1kg  Vital Signs: Temp: 98.8 F (37.1 C) (10/17 2004) Temp Source: Oral (10/17 2004) BP: 139/89 (10/17 2004) Pulse Rate: 87 (10/17 2004)  Labs: Recent Labs    11/07/18 1005 11/08/18 0427 11/08/18 1047 11/08/18 1238 11/08/18 2255  HGB 14.2 12.1*  --   --   --   HCT 43.0 37.1*  --   --   --   PLT 117* 96*  --   --   --   APTT 35  --   --   --   --   LABPROT 15.3*  --   --   --   --   INR 1.2  --   --   --   --   HEPARINUNFRC  --   --   --   --  <0.10*  CREATININE 1.82* 1.41*  --   --   --   TROPONINIHS  --   --  112* 268*  --     Estimated Creatinine Clearance: 71.9 mL/min (A) (by C-G formula based on SCr of 1.41 mg/dL (H)).  Medical History: Past Medical History:  Diagnosis Date  . Carpal tunnel syndrome on both sides   . Depression   . Diabetes mellitus without complication ([place])   . GERD (gastroesophageal reflux disease)   . Hyperlipidemia   . Hypertension   . Hypothyroidism   . Sleep apnea    CPAP  . Wears hearing aid in both ears     Medications:  No PTA anticoagulant of record-  As inpatient pt received single Lovenox 40mg  sq dose 10/17 @ 0846  Assessment: 63 y o male being treated for probable meningitis now exhibiting cardiac symptoms with elevated troponin.  Pharmacy has been consulted to initiate/monitor heparin drip.  10/17 @ 2255 HL = < 0.10, subtherapeutic  Goal of Therapy:  Heparin level 0.3-0.7 units/ml Monitor platelets by anticoagulation protocol: Yes   Plan:  Give 3000 units bolus x 1 (rebolus) then increase heparin infusion to 1750 units/hour.   Will recheck Heparin level in 6 hours after  rate increase per protocol.    Daily CBC's while on heparin drip.    Ena Dawley, PharmD Clinical Pharmacist 11/09/2018 1:02 AM

## 2018-11-09 NOTE — Consult Note (Signed)
Pulmonary Medicine          Date: 11/09/2018,   MRN# [id number] Jacob Lawson Apr 08, 1955     AdmissionWeight: 120.7 kg                 CurrentWeight: 120.7 kg      CHIEF COMPLAINT:   Acute respiratory distress   HISTORY OF PRESENT ILLNESS   Patient is clnically improved, now on room air.  For OR today d/t diabetic foot.   PAST MEDICAL HISTORY   Past Medical History:  Diagnosis Date   Carpal tunnel syndrome on both sides    Depression    Diabetes mellitus without complication (HCC)    GERD (gastroesophageal reflux disease)    Hyperlipidemia    Hypertension    Hypothyroidism    Sleep apnea    CPAP   Wears hearing aid in both ears      SURGICAL HISTORY   Past Surgical History:  Procedure Laterality Date   APPENDECTOMY     CARPAL TUNNEL RELEASE Right 11/2003   CARPAL TUNNEL RELEASE Left 11/19/2017   Procedure: LEFT OPEN CARPAL TUNNEL RELEASE;  Surgeon: Leanor Kail, MD;  Location: [facility];  Service: Orthopedics;  Laterality: Left;  Diabetic - oral meds Sleep Apnea   COLONOSCOPY     2/91, 6/00, 01/04, 7/08, 10/10, 12/13, 02/26/17   ESOPHAGOGASTRODUODENOSCOPY     2/91, 1/95, 6/00, 1/04, 7/08, 10/10   EXCISION NEUROMA Left 06/2007   foot   FINGER SURGERY     for congenital webbing   MANDIBLE SURGERY     in late 30s, fo overbite   PROSTATE BIOPSY  12/2015   ROTATOR CUFF REPAIR Left 02/2005   THYROIDECTOMY, PARTIAL     TONSILLECTOMY     and adenoidectomy     FAMILY HISTORY   History reviewed. No pertinent family history.   SOCIAL HISTORY   Social History   Tobacco Use   Smoking status: Never Smoker   Smokeless tobacco: Never Used  Substance Use Topics   Alcohol use: Yes    Comment: occasional beer   Drug use: No     MEDICATIONS    Home Medication:    Current Medication:  Current Facility-Administered Medications:    acetaminophen (TYLENOL) tablet 650 mg, 650 mg, Oral, Q6H PRN,  Dustin Flock, MD, 650 mg at 11/08/18 1930   aspirin tablet 325 mg, 325 mg, Oral, Daily, Ojie, Jude, MD, 325 mg at 11/09/18 1058   cholecalciferol (VITAMIN D) tablet 2,000 Units, 2,000 Units, Oral, Daily, Ojie, Jude, MD, 2,000 Units at 11/09/18 1057   finasteride (PROSCAR) tablet 5 mg, 5 mg, Oral, QPM, Ojie, Jude, MD, 5 mg at 11/08/18 1930   fluticasone (FLONASE) 50 MCG/ACT nasal spray 1-2 spray, 1-2 spray, Each Nare, Daily PRN, Ojie, Jude, MD   insulin aspart (novoLOG) injection 0-5 Units, 0-5 Units, Subcutaneous, QHS, Ojie, Jude, MD, 2 Units at 11/07/18 2213   insulin aspart (novoLOG) injection 0-9 Units, 0-9 Units, Subcutaneous, TID WC, Ojie, Jude, MD, 1 Units at 11/08/18 1739   levalbuterol (XOPENEX) nebulizer solution 1.25 mg, 1.25 mg, Nebulization, Q6H PRN, Dustin Flock, MD   levothyroxine (SYNTHROID) tablet 125 mcg, 125 mcg, Oral, QAC breakfast, Ojie, Jude, MD, 125 mcg at 11/09/18 0604   liothyronine (CYTOMEL) tablet 12.5 mcg, 12.5 mcg, Oral, Daily, Ojie, Jude, MD, 12.5 mcg at 11/09/18 1057   loratadine (CLARITIN) tablet 10 mg, 10 mg, Oral, Daily PRN, Stark Jock, Jude, MD   LORazepam (ATIVAN) injection 1  mg, 1 mg, Intravenous, Once, [place] BilberryPatel, Shreyang, MD   multivitamin with minerals tablet 1 tablet, 1 tablet, Oral, Daily, Ojie, Jude, MD, 1 tablet at 11/09/18 1057   ondansetron (ZOFRAN) injection 4 mg, 4 mg, Intravenous, Q6H PRN, [place] BilberryPatel, Shreyang, MD, 4 mg at 11/08/18 1737   pantoprazole (PROTONIX) EC tablet 40 mg, 40 mg, Oral, Daily, Ojie, Jude, MD, 40 mg at 11/09/18 1057   piperacillin-tazobactam (ZOSYN) IVPB 3.375 g, 3.375 g, Intravenous, Q8H, Ravishankar, Jayashree, MD   sertraline (ZOLOFT) tablet 50 mg, 50 mg, Oral, QPM, Ojie, Jude, MD, 50 mg at 11/08/18 1930   sodium chloride flush (NS) 0.9 % injection 3 mL, 3 mL, Intravenous, Once, Concha SeFunke, Mary E, MD   traZODone (DESYREL) tablet 50 mg, 50 mg, Oral, QHS PRN, [place] BilberryPatel, Shreyang, MD, 50 mg at 11/08/18 2034   vitamin B-12  (CYANOCOBALAMIN) tablet 500 mcg, 500 mcg, Oral, Daily, Ojie, Jude, MD, 500 mcg at 11/09/18 1057    ALLERGIES   Lamictal [lamotrigine] and Statins     REVIEW OF SYSTEMS    Review of Systems:  Gen:  Denies  fever, sweats, chills weigh loss  HEENT: Denies blurred vision, double vision, ear pain, eye pain, hearing loss, nose bleeds, sore throat Cardiac:  No dizziness, chest pain or heaviness, chest tightness,edema Resp:   Denies cough or sputum porduction, shortness of breath,wheezing, hemoptysis,  Gi: Denies swallowing difficulty, stomach pain, nausea or vomiting, diarrhea, constipation, bowel incontinence Gu:  Denies bladder incontinence, burning urine Ext:   Denies Joint pain, stiffness or swelling Skin: Denies  skin rash, easy bruising or bleeding or hives Endoc:  Denies polyuria, polydipsia , polyphagia or weight change Psych:   Denies depression, insomnia or hallucinations   Other:  All other systems negative   VS: BP 121/81 (BP Location: Left Arm)    Pulse 75    Temp 98.3 F (36.8 C) (Oral)    Resp 16    Ht 6' (1.829 m)    Wt 120.7 kg    SpO2 97%    BMI 36.09 kg/m      PHYSICAL EXAM    GENERAL:NAD, no fevers, chills, no weakness no fatigue HEAD: Normocephalic, atraumatic.  EYES: Pupils equal, round, reactive to light. Extraocular muscles intact. No scleral icterus.  MOUTH: Moist mucosal membrane. Dentition intact. No abscess noted.  EAR, NOSE, THROAT: Clear without exudates. No external lesions.  NECK: Supple. No thyromegaly. No nodules. No JVD.  PULMONARY: Mild bibasilar crackles CARDIOVASCULAR: S1 and S2. Regular rate and rhythm. No murmurs, rubs, or gallops. No edema. Pedal pulses 2+ bilaterally.  GASTROINTESTINAL: Soft, nontender, nondistended. No masses. Positive bowel sounds. No hepatosplenomegaly.  MUSCULOSKELETAL: No swelling, clubbing, or edema. Range of motion full in all extremities.  NEUROLOGIC: Cranial nerves II through XII are intact. No gross focal  neurological deficits. Sensation intact. Reflexes intact.  SKIN: No ulceration, lesions, rashes, or cyanosis. Skin warm and dry. Turgor intact.  PSYCHIATRIC: Mood, affect within normal limits. The patient is awake, alert and oriented x 3. Insight, judgment intact.       IMAGING    Ct Chest Wo Contrast  Result Date: 11/07/2018 CLINICAL DATA:  Fever of unknown origin. EXAM: CT CHEST, ABDOMEN AND PELVIS WITHOUT CONTRAST TECHNIQUE: Multidetector CT imaging of the chest, abdomen and pelvis was performed following the standard protocol without IV contrast. COMPARISON:  11/14/2017 CT abdomen and pelvis. FINDINGS: CT CHEST FINDINGS Cardiovascular: Heart is normal size. Aorta is normal caliber. Scattered aortic and coronary artery calcifications. Mediastinum/Nodes: No mediastinal, hilar,  or axillary adenopathy. Trachea and esophagus are unremarkable. Thyroid unremarkable. Lungs/Pleura: Lungs are clear. No focal airspace opacities or suspicious nodules. No effusions. Musculoskeletal: Chest wall soft tissues are unremarkable. No acute bony abnormality. CT ABDOMEN PELVIS FINDINGS Hepatobiliary: Diffuse low-density throughout the liver compatible with fatty infiltration. No focal abnormality. Gallbladder unremarkable. Pancreas: No focal abnormality or ductal dilatation. Spleen: No focal abnormality.  Normal size. Adrenals/Urinary Tract: Exophytic cyst off the lower pole of the left kidney measures up to 4.5 cm, stable. Exophytic cyst anteriorly off the lower pole of the left kidney also stable. No renal or ureteral stones. No hydronephrosis. Adrenal glands and urinary bladder unremarkable. Stomach/Bowel: Few scattered colonic diverticula. Stomach, large and small bowel grossly unremarkable. Vascular/Lymphatic: Aortic atherosclerosis. Prominent left inguinal lymph node has a short axis diameter of 14 mm. No other enlarged abdominal or pelvic lymph nodes. Reproductive: No visible focal abnormality. Other: No free  fluid or free air. Musculoskeletal: No acute bony abnormality. IMPRESSION: No acute cardiopulmonary disease. Aortic atherosclerosis, coronary artery disease. Diffuse fatty infiltration of the liver. No renal or ureteral stones. No hydronephrosis. Stable left renal cysts. Prominent left inguinal lymph node with a short axis diameter of 1.4 cm. This could be followed clinically to ensure stability. Electronically Signed   By: Charlett Nose M.D.   On: 11/07/2018 10:38   Ct Angio Chest Pe W Or Wo Contrast  Result Date: 11/08/2018 CLINICAL DATA:  Increased shortness of breath EXAM: CT ANGIOGRAPHY CHEST WITH CONTRAST TECHNIQUE: Multidetector CT imaging of the chest was performed using the standard protocol during bolus administration of intravenous contrast. Multiplanar CT image reconstructions and MIPs were obtained to evaluate the vascular anatomy. CONTRAST:  OMNIPAQUE IOHEXOL 350 MG/ML SOLN COMPARISON:  CT chest dated 11/07/2018 FINDINGS: Cardiovascular: Initial contrast bolus demonstrates suboptimal contrast opacification, specifically in the bilateral lower lobes. Subsequent contrast bolus demonstrates improved contrast opacification to the lobar level. However, evaluation of the bilateral pulmonary arteries is constrained by respiratory motion, leading to artifact, involving the lung apices and bilateral lower lobes. Within that constraint, there is no evidence of pulmonary embolism. The heart is normal in size. No pericardial effusion. No evidence thoracic aortic aneurysm. Atherosclerotic calcifications of the aortic arch. Mild coronary atherosclerosis of the LAD. Mediastinum/Nodes: No suspicious mediastinal lymphadenopathy. 8 mm short axis subcarinal node. Visualized left thyroid is unremarkable. Right thyroid is not visualized, presumably surgically absent. Lungs/Pleura: Evaluation of the lung parenchyma is constrained by respiratory motion. Within that constraint, there no suspicious pulmonary nodules.  Scattered areas of ground-glass opacity likely reflect expiratory imaging. This appearance is not considered suspicious for interstitial edema. No focal consolidation. No pleural effusion or pneumothorax. Upper Abdomen: Visualized upper abdomen is grossly unremarkable. Musculoskeletal: Degenerative changes of the visualized thoracolumbar spine. Review of the MIP images confirms the above findings. IMPRESSION: Limited evaluation, as above. No evidence of pulmonary embolism. When accounting for respiratory motion, there is no interval change from recent CT. Aortic Atherosclerosis (ICD10-I70.0). Electronically Signed   By: Charline Bills M.D.   On: 11/08/2018 12:10   Mr Foot Left Wo Contrast  Result Date: 11/08/2018 CLINICAL DATA:  Foot swelling, diabetic, swelling and redness EXAM: MRI OF THE LEFT FOOT WITHOUT CONTRAST TECHNIQUE: Multiplanar, multisequence MR imaging of the left forefoot was performed. No intravenous contrast was administered. COMPARISON:  Radiograph same day FINDINGS: Bones/Joint/Cartilage Normal marrow signal is seen throughout. No areas of periosteal reaction, cortical destruction or a vascular necrosis. No fractures seen. No large joint effusions are noted. Mild first MTP  joint osteoarthritis is noted. Ligaments The Lisfranc ligament is intact. The collateral ligaments appear to be intact. Muscles and Tendons There is increased feathery signal seen within the abductor and adductor hallucis muscle belly and flexor digitorum brevis. There is small amount of fluid seen surrounding it extending to the myotendinous junction of the abductor hallucis muscle. There is also mildly increased feathery signal seen within the plantar surface of the intrinsic musculature. The flexor and extensor tendons appear to be intact. Soft tissues Diffuse dorsal soft tissue edema is noted. No focal fluid collection or sinus tract is seen. There is mild skin thickening seen along the plantar medial surface of the  forefoot. IMPRESSION: 1. Myositis/muscular strain involving the abductor and adductor hallucis muscle bellies as well as the flexor digitorum brevis. 2. There is also mild muscular edema seen within the intrinsic muscles of the forefoot. 3. No evidence of osteomyelitis or soft tissue abscess. Electronically Signed   By: Jonna Clark M.D.   On: 11/08/2018 19:50   Dg Chest Portable 1 View  Result Date: 11/07/2018 CLINICAL DATA:  Fever EXAM: PORTABLE CHEST 1 VIEW COMPARISON:  04/03/2006 FINDINGS: The heart size and mediastinal contours are within normal limits. Both lungs are clear. The visualized skeletal structures are unremarkable. IMPRESSION: No acute abnormality of the lungs in AP portable projection. Electronically Signed   By: Lauralyn Primes M.D.   On: 11/07/2018 10:52   Dg Foot Complete Left  Result Date: 11/08/2018 CLINICAL DATA:  63 year old male with history of ulcer on the left foot. EXAM: LEFT FOOT - COMPLETE 3+ VIEW COMPARISON:  No priors. FINDINGS: There is no evidence of fracture or dislocation. There is no evidence of inflammatory arthropathy or other focal bone abnormality. Soft tissues are unremarkable. IMPRESSION: Negative. Electronically Signed   By: Trudie Reed M.D.   On: 11/08/2018 11:43   Ct Renal Stone Study  Result Date: 11/07/2018 CLINICAL DATA:  Fever of unknown origin. EXAM: CT CHEST, ABDOMEN AND PELVIS WITHOUT CONTRAST TECHNIQUE: Multidetector CT imaging of the chest, abdomen and pelvis was performed following the standard protocol without IV contrast. COMPARISON:  11/14/2017 CT abdomen and pelvis. FINDINGS: CT CHEST FINDINGS Cardiovascular: Heart is normal size. Aorta is normal caliber. Scattered aortic and coronary artery calcifications. Mediastinum/Nodes: No mediastinal, hilar, or axillary adenopathy. Trachea and esophagus are unremarkable. Thyroid unremarkable. Lungs/Pleura: Lungs are clear. No focal airspace opacities or suspicious nodules. No effusions.  Musculoskeletal: Chest wall soft tissues are unremarkable. No acute bony abnormality. CT ABDOMEN PELVIS FINDINGS Hepatobiliary: Diffuse low-density throughout the liver compatible with fatty infiltration. No focal abnormality. Gallbladder unremarkable. Pancreas: No focal abnormality or ductal dilatation. Spleen: No focal abnormality.  Normal size. Adrenals/Urinary Tract: Exophytic cyst off the lower pole of the left kidney measures up to 4.5 cm, stable. Exophytic cyst anteriorly off the lower pole of the left kidney also stable. No renal or ureteral stones. No hydronephrosis. Adrenal glands and urinary bladder unremarkable. Stomach/Bowel: Few scattered colonic diverticula. Stomach, large and small bowel grossly unremarkable. Vascular/Lymphatic: Aortic atherosclerosis. Prominent left inguinal lymph node has a short axis diameter of 14 mm. No other enlarged abdominal or pelvic lymph nodes. Reproductive: No visible focal abnormality. Other: No free fluid or free air. Musculoskeletal: No acute bony abnormality. IMPRESSION: No acute cardiopulmonary disease. Aortic atherosclerosis, coronary artery disease. Diffuse fatty infiltration of the liver. No renal or ureteral stones. No hydronephrosis. Stable left renal cysts. Prominent left inguinal lymph node with a short axis diameter of 1.4 cm. This could be followed clinically  to ensure stability. Electronically Signed   By: Charlett Nose M.D.   On: 11/07/2018 10:38   Ct Head Code Stroke Wo Contrast  Result Date: 11/07/2018 CLINICAL DATA:  Code stroke.  Speech difficulty.  Possible stroke. EXAM: CT HEAD WITHOUT CONTRAST TECHNIQUE: Contiguous axial images were obtained from the base of the skull through the vertex without intravenous contrast. COMPARISON:  Brain MRI 11/17/2012 FINDINGS: Brain: No evidence of acute infarction, hemorrhage, hydrocephalus, extra-axial collection or mass lesion/mass effect. Vascular: No hyperdense vessel or unexpected calcification. Skull:  Normal. Negative for fracture or focal lesion. Sinuses/Orbits: No acute finding. Other: These results were called by telephone at the time of interpretation on 11/07/2018 at 10:32 am to provider [facility] , who verbally acknowledged these results. ASPECTS [facility] Stroke Program Early CT Score) - Ganglionic level infarction (caudate, lentiform nuclei, internal capsule, insula, M1-M3 cortex): 7 - Supraganglionic infarction (M4-M6 cortex): 3 Total score (0-10 with 10 being normal): 10 IMPRESSION: Negative head CT. ASPECTS is 10. Electronically Signed   By: Marnee Spring M.D.   On: 11/07/2018 10:35   Dg Lumbar Puncture Fluoro Guide  Result Date: 11/07/2018 CLINICAL DATA:  Meningitis.  Fever and stroke symptoms. EXAM: DIAGNOSTIC LUMBAR PUNCTURE UNDER FLUOROSCOPIC GUIDANCE FLUOROSCOPY TIME:  Fluoroscopy Time:  1 minutes 42 seconds Radiation Exposure Index (if provided by the fluoroscopic device): 72.3 mGy Number of Acquired Spot Images: 1 PROCEDURE: After discussing the risks and benefits of this procedure with the patient informed consent was obtained. The back was sterilely prepped and draped. Following local anesthesia with 1% lidocaine a 22 gauge spinal needle was advanced into the lumbar spine thecal sac at the L4-L5 level under fluoroscopic guidance . 6 cc of CSF obtained. Initial CSF was slightly blood-tinged, this cleared. CSF was sent to the lab for further evaluation. Hemostasis achieved following needle removal. There no complications. IMPRESSION: Successful fluoroscopically directed lumbar puncture. CSF samples sent to the laboratory for further evaluation. Electronically Signed   By: Maisie Fus  Register   On: 11/07/2018 13:39        ASSESSMENT/PLAN   Acute hypoxemic respiratory failure   -s/p repeat CT chest - with contrast this time - no PE, bilateral GGO diffuse with AP gradient suggestive of dependent pulmonary edema likely from IVF resuscitation   -Patient is total 3.6 L intake however  with good urine output at 2.3 L with net 1.4+, GFR improved overnight baseline CKD stage II   -IV fluids stopped for now   -Otherwise unremarkable repeat CTh previous   -Transthoracic echo pending   -Supplemental oxygen via nasal cannula   -Patient has incentive spirometer at bedside which is still in bag- pt may open bag and use multiple times each hour -s/p diuresis 2.5 L   Sepsis bacteremia due to Proteus species   -Likely urinary source   -ID on case continue antimicrobials as recommended   CKD - KDIGO 2   - S/p contrast load - had to receive double infusion due to initial poor load   - monitor GFR - dc non essential nephrotoxins   - BMP in am    - Urine output monitoring - stict I&O   Multiple comorbid conditions  -As per primary team       Thank you Dr Allena Katz for allowing me to participate in the care of this patient.    Patient/Family are satisfied with care plan and all questions have been answered.  This document was prepared using Conservation officer, historic buildings and may include  unintentional dictation errors.     Vida Rigger, M.D.  Division of Pulmonary & Critical Care Medicine  [facility]

## 2018-11-09 NOTE — Consult Note (Signed)
Cardiology Consultation Note    Patient ID: Jacob Lawson, MRN: [id number], DOB/AGE: November 26, 1955 63 y.o. Admit date: 11/07/2018   Date of Consult: 11/09/2018 Primary Physician: Mickey Farber, MD Primary Cardiologist:    Chief Complaint: fever/left sided weakness Reason for Consultation: elevated troponin Requesting MD: Dr. Auburn Bilberry  HPI: Jacob Lawson is a 63 y.o. male with history of hypertension, diabetes, hyperlipidemia and sleep apnea on CPAP along with hypothyroidism was admitted after presenting to urology appointment noted to have dysuria and fever.  Developed left-sided weakness and some aphasia during the presentation.  Transferred to the hospital.  CT scan showed no acute changes.  His neurologic changes improved by the time he got to the ER.  Brain MRI is pending.  MRI of the left foot revealed myositis with no osteomyelitis or soft tissue abscess.  CT of the chest revealed no pulmonary embolus.  Chest x-ray on October 16 revealed no acute abnormalities.  Patient denied any pain.  Troponin drawn per ER protocol Was 112 followed by 2068.  EKG showed sinus tachycardia with nonspecific ST-T wave changes.  Echocardiogram revealed normal LV function with no regional wall motion abnormality.  There was mild MR and trivial TR and AI.  Valves appeared unremarkable.  Laboratories revealed acute renal insufficiency with a creatinine of 1.82 up from a baseline of 1.10.  This a.m. creatinine is improved to 1.21 with potassium of 3.2.  Was normal.  White count was 8.2 with a hemoglobin hematocrit of 11.6 and 35.2.  Arterial blood gas on admission showed a pH of 7.46 with a PCO2 30 PO2 of 93.  TSH was 0.522.  Blood culture grew Enterobacter. Past Medical History:  Diagnosis Date  . Carpal tunnel syndrome on both sides   . Depression   . Diabetes mellitus without complication (HCC)   . GERD (gastroesophageal reflux disease)   . Hyperlipidemia   . Hypertension   . Hypothyroidism   . Sleep  apnea    CPAP  . Wears hearing aid in both ears       Surgical History:  Past Surgical History:  Procedure Laterality Date  . APPENDECTOMY    . CARPAL TUNNEL RELEASE Right 11/2003  . CARPAL TUNNEL RELEASE Left 11/19/2017   Procedure: LEFT OPEN CARPAL TUNNEL RELEASE;  Surgeon: Erin Sons, MD;  Location: [facility];  Service: Orthopedics;  Laterality: Left;  Diabetic - oral meds Sleep Apnea  . COLONOSCOPY     2/91, 6/00, 01/04, 7/08, 10/10, 12/13, 02/26/17  . ESOPHAGOGASTRODUODENOSCOPY     2/91, 1/95, 6/00, 1/04, 7/08, 10/10  . EXCISION NEUROMA Left 06/2007   foot  . FINGER SURGERY     for congenital webbing  . MANDIBLE SURGERY     in late 30s, fo overbite  . PROSTATE BIOPSY  12/2015  . ROTATOR CUFF REPAIR Left 02/2005  . THYROIDECTOMY, PARTIAL    . TONSILLECTOMY     and adenoidectomy     Home Meds: Prior to Admission medications   Medication Sig Start Date End Date Taking? Authorizing Provider  aspirin 81 MG tablet Take 81 mg by mouth daily.   Yes [provider]  Cholecalciferol (VITAMIN D3) 2000 units TABS Take 2,000 Units by mouth daily.    Yes [provider]  co-enzyme Q-10 50 MG capsule Take 50 mg by mouth daily.   Yes [provider]  esomeprazole (NEXIUM) 20 MG capsule Take 20 mg by mouth daily before breakfast.    Yes [provider]  finasteride (PROSCAR) 5 MG tablet Take 5 mg by mouth every evening.    Yes [provider]  fluticasone (FLONASE) 50 MCG/ACT nasal spray Place 1-2 sprays into both nostrils daily as needed for allergies.    Yes [provider]  Boris Lown Oil 1000 MG CAPS Take 3,000 mg by mouth every evening.    Yes [provider]  levothyroxine (SYNTHROID, LEVOTHROID) 125 MCG tablet Take 125 mcg by mouth daily before breakfast.   Yes [provider]  liothyronine (CYTOMEL) 25 MCG tablet Take 12.5 mcg by mouth daily.    Yes [provider]  lisinopril (ZESTRIL) 40  MG tablet Take 40 mg by mouth daily.    Yes [provider]  loratadine (CLARITIN) 10 MG tablet Take 10 mg by mouth daily as needed for allergies.   Yes [provider]  metFORMIN (GLUCOPHAGE-XR) 500 MG 24 hr tablet Take 1,000 mg by mouth daily with breakfast.   Yes [provider]  metoprolol succinate (TOPROL-XL) 50 MG 24 hr tablet Take 50 mg by mouth daily. Take with or immediately following a meal.   Yes [provider]  Multiple Vitamins-Minerals (CENTRUM SILVER PO) Take 1 tablet by mouth daily.    Yes [provider]  sertraline (ZOLOFT) 50 MG tablet Take 50 mg by mouth every evening.    Yes [provider]  TESTOSTERONE TD Place 1.25 g onto the skin daily. (20% topical cream)   Yes [provider]  vitamin B-12 (CYANOCOBALAMIN) 500 MCG tablet Take 500 mcg by mouth daily.   Yes [provider]    Inpatient Medications:  . aspirin  325 mg Oral Daily  . cholecalciferol  2,000 Units Oral Daily  . finasteride  5 mg Oral QPM  . insulin aspart  0-5 Units Subcutaneous QHS  . insulin aspart  0-9 Units Subcutaneous TID WC  . levothyroxine  125 mcg Oral QAC breakfast  . liothyronine  12.5 mcg Oral Daily  . LORazepam  1 mg Intravenous Once  . multivitamin with minerals  1 tablet Oral Daily  . pantoprazole  40 mg Oral Daily  . potassium chloride  40 mEq Oral Once  . sertraline  50 mg Oral QPM  . sodium chloride flush  3 mL Intravenous Once  . vitamin B-12  500 mcg Oral Daily   . cefTRIAXone (ROCEPHIN)  IV 2 g (11/08/18 1636)  . heparin 1,750 Units/hr (11/09/18 8657)    Allergies:  Allergies  Allergen Reactions  . Lamictal [Lamotrigine] Hives  . Statins     Muscle/joint aches    Social History   Socioeconomic History  . Marital status: Married    Spouse name: Not on file  . Number of children: Not on file  . Years of education: Not on file  . Highest education level: Not on file  Occupational History  .  Not on file  Social Needs  . Financial resource strain: Not on file  . Food insecurity    Worry: Not on file    Inability: Not on file  . Transportation needs    Medical: Not on file    Non-medical: Not on file  Tobacco Use  . Smoking status: Never Smoker  . Smokeless tobacco: Never Used  Substance and Sexual Activity  . Alcohol use: Yes    Comment: occasional beer  . Drug use: No  . Sexual activity: Not on file  Lifestyle  . Physical activity    Days per  week: Not on file    Minutes per session: Not on file  . Stress: Not on file  Relationships  . Social Musician on phone: Not on file    Gets together: Not on file    Attends religious service: Not on file    Active member of club or organization: Not on file    Attends meetings of clubs or organizations: Not on file    Relationship status: Not on file  . Intimate partner violence    Fear of current or ex partner: Not on file    Emotionally abused: Not on file    Physically abused: Not on file    Forced sexual activity: Not on file  Other Topics Concern  . Not on file  Social History Narrative  . Not on file     History reviewed. No pertinent family history.   Review of Systems: A 12-system review of systems was performed and is negative except as noted in the HPI.  Labs: No results for input(s): CKTOTAL, CKMB, TROPONINI in the last 72 hours. Lab Results  Component Value Date   WBC 8.2 11/09/2018   HGB 11.6 (L) 11/09/2018   HCT 35.2 (L) 11/09/2018   MCV 81.5 11/09/2018   PLT 99 (L) 11/09/2018    Recent Labs  Lab 11/07/18 1005  11/09/18 0447  NA 131*   < > 136  K 3.9   < > 3.2*  CL 95*   < > 101  CO2 17*   < > 25  BUN 25*   < > 20  CREATININE 1.82*   < > 1.21  CALCIUM 8.7*   < > 8.3*  PROT 8.1  --   --   BILITOT 1.3*  --   --   ALKPHOS 61  --   --   ALT 27  --   --   AST 44*  --   --   GLUCOSE 206*   < > 157*   < > = values in this interval not displayed.   Lab Results  Component  Value Date   CHOL 120 11/09/2018   HDL 12 (L) 11/09/2018   LDLCALC 60 11/09/2018   TRIG 239 (H) 11/09/2018   No results found for: DDIMER  Radiology/Studies:  Ct Chest Wo Contrast  Result Date: 11/07/2018 CLINICAL DATA:  Fever of unknown origin. EXAM: CT CHEST, ABDOMEN AND PELVIS WITHOUT CONTRAST TECHNIQUE: Multidetector CT imaging of the chest, abdomen and pelvis was performed following the standard protocol without IV contrast. COMPARISON:  11/14/2017 CT abdomen and pelvis. FINDINGS: CT CHEST FINDINGS Cardiovascular: Heart is normal size. Aorta is normal caliber. Scattered aortic and coronary artery calcifications. Mediastinum/Nodes: No mediastinal, hilar, or axillary adenopathy. Trachea and esophagus are unremarkable. Thyroid unremarkable. Lungs/Pleura: Lungs are clear. No focal airspace opacities or suspicious nodules. No effusions. Musculoskeletal: Chest wall soft tissues are unremarkable. No acute bony abnormality. CT ABDOMEN PELVIS FINDINGS Hepatobiliary: Diffuse low-density throughout the liver compatible with fatty infiltration. No focal abnormality. Gallbladder unremarkable. Pancreas: No focal abnormality or ductal dilatation. Spleen: No focal abnormality.  Normal size. Adrenals/Urinary Tract: Exophytic cyst off the lower pole of the left kidney measures up to 4.5 cm, stable. Exophytic cyst anteriorly off the lower pole of the left kidney also stable. No renal or ureteral stones. No hydronephrosis. Adrenal glands and urinary bladder unremarkable. Stomach/Bowel: Few scattered colonic diverticula. Stomach, large and small bowel grossly unremarkable. Vascular/Lymphatic: Aortic atherosclerosis. Prominent left inguinal lymph node has a  short axis diameter of 14 mm. No other enlarged abdominal or pelvic lymph nodes. Reproductive: No visible focal abnormality. Other: No free fluid or free air. Musculoskeletal: No acute bony abnormality. IMPRESSION: No acute cardiopulmonary disease. Aortic  atherosclerosis, coronary artery disease. Diffuse fatty infiltration of the liver. No renal or ureteral stones. No hydronephrosis. Stable left renal cysts. Prominent left inguinal lymph node with a short axis diameter of 1.4 cm. This could be followed clinically to ensure stability. Electronically Signed   By: Charlett Nose M.D.   On: 11/07/2018 10:38   Ct Angio Chest Pe W Or Wo Contrast  Result Date: 11/08/2018 CLINICAL DATA:  Increased shortness of breath EXAM: CT ANGIOGRAPHY CHEST WITH CONTRAST TECHNIQUE: Multidetector CT imaging of the chest was performed using the standard protocol during bolus administration of intravenous contrast. Multiplanar CT image reconstructions and MIPs were obtained to evaluate the vascular anatomy. CONTRAST:  OMNIPAQUE IOHEXOL 350 MG/ML SOLN COMPARISON:  CT chest dated 11/07/2018 FINDINGS: Cardiovascular: Initial contrast bolus demonstrates suboptimal contrast opacification, specifically in the bilateral lower lobes. Subsequent contrast bolus demonstrates improved contrast opacification to the lobar level. However, evaluation of the bilateral pulmonary arteries is constrained by respiratory motion, leading to artifact, involving the lung apices and bilateral lower lobes. Within that constraint, there is no evidence of pulmonary embolism. The heart is normal in size. No pericardial effusion. No evidence thoracic aortic aneurysm. Atherosclerotic calcifications of the aortic arch. Mild coronary atherosclerosis of the LAD. Mediastinum/Nodes: No suspicious mediastinal lymphadenopathy. 8 mm short axis subcarinal node. Visualized left thyroid is unremarkable. Right thyroid is not visualized, presumably surgically absent. Lungs/Pleura: Evaluation of the lung parenchyma is constrained by respiratory motion. Within that constraint, there no suspicious pulmonary nodules. Scattered areas of ground-glass opacity likely reflect expiratory imaging. This appearance is not considered  suspicious for interstitial edema. No focal consolidation. No pleural effusion or pneumothorax. Upper Abdomen: Visualized upper abdomen is grossly unremarkable. Musculoskeletal: Degenerative changes of the visualized thoracolumbar spine. Review of the MIP images confirms the above findings. IMPRESSION: Limited evaluation, as above. No evidence of pulmonary embolism. When accounting for respiratory motion, there is no interval change from recent CT. Aortic Atherosclerosis (ICD10-I70.0). Electronically Signed   By: Charline Bills M.D.   On: 11/08/2018 12:10   Mr Foot Left Wo Contrast  Result Date: 11/08/2018 CLINICAL DATA:  Foot swelling, diabetic, swelling and redness EXAM: MRI OF THE LEFT FOOT WITHOUT CONTRAST TECHNIQUE: Multiplanar, multisequence MR imaging of the left forefoot was performed. No intravenous contrast was administered. COMPARISON:  Radiograph same day FINDINGS: Bones/Joint/Cartilage Normal marrow signal is seen throughout. No areas of periosteal reaction, cortical destruction or a vascular necrosis. No fractures seen. No large joint effusions are noted. Mild first MTP joint osteoarthritis is noted. Ligaments The Lisfranc ligament is intact. The collateral ligaments appear to be intact. Muscles and Tendons There is increased feathery signal seen within the abductor and adductor hallucis muscle belly and flexor digitorum brevis. There is small amount of fluid seen surrounding it extending to the myotendinous junction of the abductor hallucis muscle. There is also mildly increased feathery signal seen within the plantar surface of the intrinsic musculature. The flexor and extensor tendons appear to be intact. Soft tissues Diffuse dorsal soft tissue edema is noted. No focal fluid collection or sinus tract is seen. There is mild skin thickening seen along the plantar medial surface of the forefoot. IMPRESSION: 1. Myositis/muscular strain involving the abductor and adductor hallucis muscle bellies  as well as the flexor digitorum  brevis. 2. There is also mild muscular edema seen within the intrinsic muscles of the forefoot. 3. No evidence of osteomyelitis or soft tissue abscess. Electronically Signed   By: Jonna ClarkBindu  Avutu M.D.   On: 11/08/2018 19:50   Dg Chest Portable 1 View  Result Date: 11/07/2018 CLINICAL DATA:  Fever EXAM: PORTABLE CHEST 1 VIEW COMPARISON:  04/03/2006 FINDINGS: The heart size and mediastinal contours are within normal limits. Both lungs are clear. The visualized skeletal structures are unremarkable. IMPRESSION: No acute abnormality of the lungs in AP portable projection. Electronically Signed   By: Lauralyn PrimesAlex  Bibbey M.D.   On: 11/07/2018 10:52   Dg Foot Complete Left  Result Date: 11/08/2018 CLINICAL DATA:  63 year old male with history of ulcer on the left foot. EXAM: LEFT FOOT - COMPLETE 3+ VIEW COMPARISON:  No priors. FINDINGS: There is no evidence of fracture or dislocation. There is no evidence of inflammatory arthropathy or other focal bone abnormality. Soft tissues are unremarkable. IMPRESSION: Negative. Electronically Signed   By: Trudie Reedaniel  Entrikin M.D.   On: 11/08/2018 11:43   Ct Renal Stone Study  Result Date: 11/07/2018 CLINICAL DATA:  Fever of unknown origin. EXAM: CT CHEST, ABDOMEN AND PELVIS WITHOUT CONTRAST TECHNIQUE: Multidetector CT imaging of the chest, abdomen and pelvis was performed following the standard protocol without IV contrast. COMPARISON:  11/14/2017 CT abdomen and pelvis. FINDINGS: CT CHEST FINDINGS Cardiovascular: Heart is normal size. Aorta is normal caliber. Scattered aortic and coronary artery calcifications. Mediastinum/Nodes: No mediastinal, hilar, or axillary adenopathy. Trachea and esophagus are unremarkable. Thyroid unremarkable. Lungs/Pleura: Lungs are clear. No focal airspace opacities or suspicious nodules. No effusions. Musculoskeletal: Chest wall soft tissues are unremarkable. No acute bony abnormality. CT ABDOMEN PELVIS FINDINGS  Hepatobiliary: Diffuse low-density throughout the liver compatible with fatty infiltration. No focal abnormality. Gallbladder unremarkable. Pancreas: No focal abnormality or ductal dilatation. Spleen: No focal abnormality.  Normal size. Adrenals/Urinary Tract: Exophytic cyst off the lower pole of the left kidney measures up to 4.5 cm, stable. Exophytic cyst anteriorly off the lower pole of the left kidney also stable. No renal or ureteral stones. No hydronephrosis. Adrenal glands and urinary bladder unremarkable. Stomach/Bowel: Few scattered colonic diverticula. Stomach, large and small bowel grossly unremarkable. Vascular/Lymphatic: Aortic atherosclerosis. Prominent left inguinal lymph node has a short axis diameter of 14 mm. No other enlarged abdominal or pelvic lymph nodes. Reproductive: No visible focal abnormality. Other: No free fluid or free air. Musculoskeletal: No acute bony abnormality. IMPRESSION: No acute cardiopulmonary disease. Aortic atherosclerosis, coronary artery disease. Diffuse fatty infiltration of the liver. No renal or ureteral stones. No hydronephrosis. Stable left renal cysts. Prominent left inguinal lymph node with a short axis diameter of 1.4 cm. This could be followed clinically to ensure stability. Electronically Signed   By: Charlett NoseKevin  Dover M.D.   On: 11/07/2018 10:38   Ct Head Code Stroke Wo Contrast  Result Date: 11/07/2018 CLINICAL DATA:  Code stroke.  Speech difficulty.  Possible stroke. EXAM: CT HEAD WITHOUT CONTRAST TECHNIQUE: Contiguous axial images were obtained from the base of the skull through the vertex without intravenous contrast. COMPARISON:  Brain MRI 11/17/2012 FINDINGS: Brain: No evidence of acute infarction, hemorrhage, hydrocephalus, extra-axial collection or mass lesion/mass effect. Vascular: No hyperdense vessel or unexpected calcification. Skull: Normal. Negative for fracture or focal lesion. Sinuses/Orbits: No acute finding. Other: These results were called by  telephone at the time of interpretation on 11/07/2018 at 10:32 am to [facility] , who verbally acknowledged these results. ASPECTS [facility] [place] Stroke  Program Early CT Score) - Ganglionic level infarction (caudate, lentiform nuclei, internal capsule, insula, M1-M3 cortex): 7 - Supraganglionic infarction (M4-M6 cortex): 3 Total score (0-10 with 10 being normal): 10 IMPRESSION: Negative head CT. ASPECTS is 10. Electronically Signed   By: Monte Fantasia M.D.   On: 11/07/2018 10:35   Dg Lumbar Puncture Fluoro Guide  Result Date: 11/07/2018 CLINICAL DATA:  Meningitis.  Fever and stroke symptoms. EXAM: DIAGNOSTIC LUMBAR PUNCTURE UNDER FLUOROSCOPIC GUIDANCE FLUOROSCOPY TIME:  Fluoroscopy Time:  1 minutes 42 seconds Radiation Exposure Index (if provided by the fluoroscopic device): 72.3 mGy Number of Acquired Spot Images: 1 PROCEDURE: After discussing the risks and benefits of this procedure with the patient informed consent was obtained. The back was sterilely prepped and draped. Following local anesthesia with 1% lidocaine a 22 gauge spinal needle was advanced into the lumbar spine thecal sac at the L4-L5 level under fluoroscopic guidance . 6 cc of CSF obtained. Initial CSF was slightly blood-tinged, this cleared. CSF was sent to the lab for further evaluation. Hemostasis achieved following needle removal. There no complications. IMPRESSION: Successful fluoroscopically directed lumbar puncture. CSF samples sent to the laboratory for further evaluation. Electronically Signed   By: Marcello Moores  Register   On: 11/07/2018 13:39    Wt Readings from Last 3 Encounters:  11/07/18 120.7 kg  12/30/17 120.2 kg  11/19/17 121.1 kg    EKG: sinus tachycardia with nssttw changes.   Physical Exam:  Blood pressure 121/81, pulse 75, temperature 98.3 F (36.8 C), temperature source Oral, resp. rate 16, height 6' (1.829 m), weight 120.7 kg, SpO2 97 %. Body mass index is 36.09 kg/m. General: Well developed, well nourished,  in no acute distress. Head: Normocephalic, atraumatic, sclera non-icteric, no xanthomas, nares are without discharge.  Neck: Negative for carotid bruits. JVD not elevated. Lungs: Clear bilaterally to auscultation without wheezes, rales, or rhonchi. Breathing is unlabored. Heart: RRR with S1 S2. No murmurs, rubs, or gallops appreciated. Abdomen: Soft, non-tender, non-distended with normoactive bowel sounds. No hepatomegaly. No rebound/guarding. No obvious abdominal masses. Msk:  Strength and tone appear normal for age. Extremities: No clubbing or cyanosis. No edema.  Distal pedal pulses are 2+ and equal bilaterally. Neuro: Alert and oriented X 3. No facial asymmetry. No focal deficit. Moves all extremities spontaneously. Psych:  Responds to questions appropriately with a normal affect.     Assessment and Plan   Patient is a 63 year old male with history of hypertension,borderline diabetes mellitus,hyperlipidemia,obstructive sleep apnea for which patient uses CPAP at night and hypothyroidism being admitted for suspected meningitis and resolved neurological symptoms. Improved clinically. No evidence clinically of endocarditis. Echo showed normal lv funciton with no evidence of vegetations. Continue current therapy . Does not appear to require tee at present.            Signed, Teodoro Spray MD 11/09/2018, 8:45 AM Pager: [phone]

## 2018-11-09 NOTE — Progress Notes (Signed)
[facility]                                                                                                                                                                                  Patient Demographics   Divit Stipp, is a 63 y.o. male, DOB - [birth year], JSH:[id number]  Admit date - 11/07/2018   Admitting Physician Jude Stark Jock, MD  Outpatient Primary MD for the patient is Ezequiel Kayser, MD   LOS - 2  Subjective: Pt doing better.  No further chills or chest pains.   Review of Systems:   CONSTITUTIONAL: No documented fever. No fatigue, weakness. No weight gain, no weight loss.  EYES: No blurry or double vision.  ENT: No tinnitus. No postnasal drip. No redness of the oropharynx.  RESPIRATORY: No cough, no wheeze, no hemoptysis. No dyspnea.  CARDIOVASCULAR: No chest pain. No orthopnea. No palpitations. No syncope.  GASTROINTESTINAL: No nausea, no vomiting or diarrhea. No abdominal pain. No melena or hematochezia.  GENITOURINARY: No dysuria or hematuria.  ENDOCRINE: No polyuria or nocturia. No heat or cold intolerance.  HEMATOLOGY: No anemia. No bruising. No bleeding.  INTEGUMENTARY: No rashes. No lesions.  MUSCULOSKELETAL: No arthritis. No swelling. No gout.  NEUROLOGIC: No numbness, tingling, or ataxia. No seizure-type activity.  PSYCHIATRIC: No anxiety. No insomnia. No ADD.    Vitals:   Vitals:   11/08/18 1649 11/08/18 2004 11/08/18 2013 11/09/18 0611  BP: 128/74 139/89  121/81  Pulse: 95 87  75  Resp: (!) 26 18  16   Temp: 99 F (37.2 C) 98.8 F (37.1 C)  98.3 F (36.8 C)  TempSrc: Oral Oral  Oral  SpO2: 95% 99% 99% 97%  Weight:      Height:        Wt Readings from Last 3 Encounters:  11/07/18 120.7 kg  12/30/17 120.2 kg  11/19/17 121.1 kg     Intake/Output Summary (Last 24 hours) at 11/09/2018 1128 Last data filed at 11/09/2018 1109 Gross per 24 hour  Intake 306.99 ml  Output 3025 ml  Net -2718.01 ml     Physical Exam:   GENERAL: Pleasant-appearing in no apparent distress.  HEAD, EYES, EARS, NOSE AND THROAT: Atraumatic, normocephalic. Extraocular muscles are intact. Pupils equal and reactive to light. Sclerae anicteric. No conjunctival injection. No oro-pharyngeal erythema.  NECK: Supple. There is no jugular venous distention. No bruits, no lymphadenopathy, no thyromegaly.  HEART: Regular rate and rhythm,. No murmurs, no rubs, no clicks.  LUNGS: Clear to auscultation bilaterally. No rales or rhonchi. No wheezes.  ABDOMEN: Soft, flat, nontender, nondistended. Has good bowel sounds. No hepatosplenomegaly appreciated.  EXTREMITIES: No evidence of  any cyanosis, clubbing, or peripheral edema.  +2 pedal and radial pulses bilaterally.  NEUROLOGIC: The patient is alert, awake, and oriented x3 with no focal motor or sensory deficits appreciated bilaterally.  SKIN: No rash.  Right lower extremity there is an area of fluctuation Psych: Not anxious, depressed LN: No inguinal LN enlargement    Antibiotics   Anti-infectives (From admission, onward)   Start     Dose/Rate Route Frequency Ordered Stop   11/09/18 1200  piperacillin-tazobactam (ZOSYN) IVPB 3.375 g     3.375 g 12.5 mL/hr over 240 Minutes Intravenous Every 8 hours 11/09/18 1114     11/08/18 1445  cefTRIAXone (ROCEPHIN) 2 g in sodium chloride 0.9 % 100 mL IVPB  Status:  Discontinued     2 g 200 mL/hr over 30 Minutes Intravenous Every 24 hours 11/07/18 1739 11/09/18 1114   11/08/18 1200  vancomycin (VANCOCIN) 1,500 mg in sodium chloride 0.9 % 500 mL IVPB  Status:  Discontinued     1,500 mg 250 mL/hr over 120 Minutes Intravenous Every 24 hours 11/08/18 0817 11/08/18 1028   11/08/18 1000  vancomycin (VANCOCIN) 1,250 mg in sodium chloride 0.9 % 250 mL IVPB  Status:  Discontinued     1,250 mg 166.7 mL/hr over 90 Minutes Intravenous Every 24 hours 11/07/18 1503 11/08/18 0817   11/07/18 1445  cefTRIAXone (ROCEPHIN) 2 g in sodium chloride 0.9  % 100 mL IVPB  Status:  Discontinued     2 g 200 mL/hr over 30 Minutes Intravenous Every 12 hours 11/07/18 1439 11/07/18 1739   11/07/18 1215  vancomycin (VANCOCIN) 1,250 mg in sodium chloride 0.9 % 250 mL IVPB     1,250 mg 166.7 mL/hr over 90 Minutes Intravenous  Once 11/07/18 1202 11/07/18 1606   11/07/18 1115  cefTRIAXone (ROCEPHIN) 2 g in sodium chloride 0.9 % 100 mL IVPB     2 g 200 mL/hr over 30 Minutes Intravenous  Once 11/07/18 1102 11/07/18 1205   11/07/18 1115  vancomycin (VANCOCIN) IVPB 1000 mg/200 mL premix     1,000 mg 200 mL/hr over 60 Minutes Intravenous  Once 11/07/18 1102 11/07/18 1314      Medications   Scheduled Meds: . aspirin  325 mg Oral Daily  . cholecalciferol  2,000 Units Oral Daily  . finasteride  5 mg Oral QPM  . insulin aspart  0-5 Units Subcutaneous QHS  . insulin aspart  0-9 Units Subcutaneous TID WC  . levothyroxine  125 mcg Oral QAC breakfast  . liothyronine  12.5 mcg Oral Daily  . LORazepam  1 mg Intravenous Once  . multivitamin with minerals  1 tablet Oral Daily  . pantoprazole  40 mg Oral Daily  . sertraline  50 mg Oral QPM  . sodium chloride flush  3 mL Intravenous Once  . vitamin B-12  500 mcg Oral Daily   Continuous Infusions: . piperacillin-tazobactam (ZOSYN)  IV     PRN Meds:.acetaminophen, fluticasone, levalbuterol, loratadine, ondansetron (ZOFRAN) IV, traZODone   Data Review:   Micro Results Recent Results (from the past 240 hour(s))  Blood culture (routine x 2)     Status: None (Preliminary result)   Collection Time: 11/07/18 10:05 AM   Specimen: BLOOD  Result Value Ref Range Status   Specimen Description BLOOD BLOOD RIGHT FOREARM  Final   Special Requests   Final    BOTTLES DRAWN AEROBIC AND ANAEROBIC Blood Culture adequate volume   Culture   Final    NO GROWTH 2 DAYS Performed  at [facility], [address], [place], [place] [address]    Report Status PENDING  Incomplete  Blood culture (routine x 2)      Status: Abnormal (Preliminary result)   Collection Time: 11/07/18 10:05 AM   Specimen: BLOOD  Result Value Ref Range Status   Specimen Description   Final    BLOOD BLOOD LEFT FOREARM Performed at [facility], [address], [place], [place] [address]    Special Requests   Final    BOTTLES DRAWN AEROBIC AND ANAEROBIC Blood Culture adequate volume Performed at [facility], [address], [place], [place] [address]    Culture  Setup Time   Final    GRAM NEGATIVE RODS IN BOTH AEROBIC AND ANAEROBIC BOTTLES CRITICAL RESULT CALLED TO, READ BACK BY AND VERIFIED WITH: Tabor HALL @506  11/08/2018 TTG    Culture (A)  Final    PROTEUS MIRABILIS SUSCEPTIBILITIES TO FOLLOW Performed at [facility], [address] [place], [place] [address]    Report Status PENDING  Incomplete  Blood Culture ID Panel (Reflexed)     Status: Abnormal   Collection Time: 11/07/18 10:05 AM  Result Value Ref Range Status   Enterococcus species NOT DETECTED NOT DETECTED Final   Listeria monocytogenes NOT DETECTED NOT DETECTED Final   Staphylococcus species NOT DETECTED NOT DETECTED Final   Staphylococcus aureus (BCID) NOT DETECTED NOT DETECTED Final   Streptococcus species NOT DETECTED NOT DETECTED Final   Streptococcus agalactiae NOT DETECTED NOT DETECTED Final   Streptococcus pneumoniae NOT DETECTED NOT DETECTED Final   Streptococcus pyogenes NOT DETECTED NOT DETECTED Final   Acinetobacter baumannii NOT DETECTED NOT DETECTED Final   Enterobacteriaceae species DETECTED (A) NOT DETECTED Final    Comment: Enterobacteriaceae represent a large family of gram-negative bacteria, not a single organism. CRITICAL RESULT CALLED TO, READ BACK BY AND VERIFIED WITH: [place]zer HALL @506  11/08/2018 TTG    Enterobacter cloacae complex NOT DETECTED NOT DETECTED Final   Escherichia coli NOT DETECTED NOT DETECTED Final   Klebsiella oxytoca NOT DETECTED NOT DETECTED Final   Klebsiella pneumoniae NOT  DETECTED NOT DETECTED Final   Proteus species DETECTED (A) NOT DETECTED Final    Comment: CRITICAL RESULT CALLED TO, READ BACK BY AND VERIFIED WITH: Crossan HALL @506  11/08/2018 TTG    Serratia marcescens NOT DETECTED NOT DETECTED Final   Carbapenem resistance NOT DETECTED NOT DETECTED Final   Haemophilus influenzae NOT DETECTED NOT DETECTED Final   Neisseria meningitidis NOT DETECTED NOT DETECTED Final   Pseudomonas aeruginosa NOT DETECTED NOT DETECTED Final   Candida albicans NOT DETECTED NOT DETECTED Final   Candida glabrata NOT DETECTED NOT DETECTED Final   Candida krusei NOT DETECTED NOT DETECTED Final   Candida parapsilosis NOT DETECTED NOT DETECTED Final   Candida tropicalis NOT DETECTED NOT DETECTED Final    Comment: Performed at [facility], [address] [place], [place] [address]  SARS Coronavirus 2 by RT PCR (hospital order, performed in [facility] hospital lab) Nasopharyngeal Nasopharyngeal Swab     Status: None   Collection Time: 11/07/18 10:40 AM   Specimen: Nasopharyngeal Swab  Result Value Ref Range Status   SARS Coronavirus 2 NEGATIVE NEGATIVE Final    Comment: (NOTE) If result is NEGATIVE SARS-CoV-2 target nucleic acids are NOT DETECTED. The SARS-CoV-2 RNA is generally detectable in upper and lower  respiratory specimens during the acute phase of infection. The lowest  concentration of SARS-CoV-2 viral copies this assay can detect  is 250  copies / mL. A negative result does not preclude SARS-CoV-2 infection  and should not be used as the sole basis for treatment or other  patient management decisions.  A negative result may occur with  improper specimen collection / handling, submission of specimen other  than nasopharyngeal swab, presence of viral mutation(s) within the  areas targeted by this assay, and inadequate number of viral copies  (<250 copies / mL). A negative result must be combined with clinical  observations, patient history, and  epidemiological information. If result is POSITIVE SARS-CoV-2 target nucleic acids are DETECTED. The SARS-CoV-2 RNA is generally detectable in upper and lower  respiratory specimens dur ing the acute phase of infection.  Positive  results are indicative of active infection with SARS-CoV-2.  Clinical  correlation with patient history and other diagnostic information is  necessary to determine patient infection status.  Positive results do  not rule out bacterial infection or co-infection with other viruses. If result is PRESUMPTIVE POSTIVE SARS-CoV-2 nucleic acids MAY BE PRESENT.   A presumptive positive result was obtained on the submitted specimen  and confirmed on repeat testing.  While 2019 novel coronavirus  (SARS-CoV-2) nucleic acids may be present in the submitted sample  additional confirmatory testing may be necessary for epidemiological  and / or clinical management purposes  to differentiate between  SARS-CoV-2 and other Sarbecovirus currently known to infect humans.  If clinically indicated additional testing with an alternate test  methodology [phone]) is advised. The SARS-CoV-2 RNA is generally  detectable in upper and lower respiratory sp ecimens during the acute  phase of infection. The expected result is Negative. Fact Sheet for Patients:  BoilerBrush.com.cy Fact Sheet for Healthcare Providers: https://pope.com/ This test is not yet approved or cleared by the [place] FDA and has been authorized for detection and/or diagnosis of SARS-CoV-2 by FDA under an Emergency Use Authorization (EUA).  This EUA will remain in effect (meaning this test can be used) for the duration of the COVID-19 declaration under Section 564(b)(1) of the Act, 21 U.S.C. section 360bbb-3(b)(1), unless the authorization is terminated or revoked sooner. Performed at [facility], [address] [place], [place] [address]   CSF  culture     Status: None (Preliminary result)   Collection Time: 11/07/18 12:50 PM   Specimen: CSF; Cerebrospinal Fluid  Result Value Ref Range Status   Specimen Description   Final    CSF Performed at [facility], [address] [place], [place] [address]    Special Requests   Final    NONE Performed at [facility], [address] [place], [place] [address]    Gram Stain   Final    WBC SEEN RED BLOOD CELLS NO ORGANISMS SEEN Performed at [facility], [address] [place], [place] [address]    Culture   Final    NO GROWTH < 24 HOURS Performed at [facility], [address], [place], [place] [address]    Report Status PENDING  Incomplete  Culture, fungus without smear     Status: None (Preliminary result)   Collection Time: 11/07/18  1:10 PM   Specimen: PATH Cytology CSF; Cerebrospinal Fluid  Result Value Ref Range Status   Specimen Description   Final    CSF Performed at [facility], [address] [place], [place] [address]    Special Requests   Final    NONE Performed at [facility], [address] [place], [place] [address]    Culture   Final    NO FUNGUS ISOLATED AFTER 1 DAY Performed at [facility], [address] [place], [place] [address]    Report Status PENDING  Incomplete  Urine Culture     Status: None   Collection Time: 11/07/18  3:41 PM   Specimen: Urine, Random  Result Value Ref Range Status   Specimen Description   Final    URINE, RANDOM Performed at [facility], [address] [place], [place] [address]    Special Requests   Final    NONE Performed at [facility], [address] [place], [place] [address]    Culture   Final    NO GROWTH Performed at [facility], [address] [place], [place] [address]    Report Status 11/09/2018 FINAL  Final  Aerobic/Anaerobic Culture (surgical/deep wound)     Status: None (Preliminary result)    Collection Time: 11/08/18  2:25 PM   Specimen: Wound; Abscess  Result Value Ref Range Status   Specimen Description   Final    WOUND LEFT FOOT Performed at [facility], [address] [place], [place] [address]    Special Requests   Final    NONE Performed at [facility], [address] [place], [place] [address]    Gram Stain   Final    MODERATE WBC PRESENT, PREDOMINANTLY PMN MODERATE GRAM POSITIVE COCCI MODERATE GRAM NEGATIVE RODS    Culture   Final    ABUNDANT ENTEROCOCCUS FAECALIS SUSCEPTIBILITIES TO FOLLOW CULTURE REINCUBATED FOR BETTER GROWTH Performed at [facility], [address] [place], [place] [address]    Report Status PENDING  Incomplete    Radiology Reports Ct Chest Wo Contrast  Result Date: 11/07/2018 CLINICAL DATA:  Fever of unknown origin. EXAM: CT CHEST, ABDOMEN AND PELVIS WITHOUT CONTRAST TECHNIQUE: Multidetector CT imaging of the chest, abdomen and pelvis was performed following the standard protocol without IV contrast. COMPARISON:  11/14/2017 CT abdomen and pelvis. FINDINGS: CT CHEST FINDINGS Cardiovascular: Heart is normal size. Aorta is normal caliber. Scattered aortic and coronary artery calcifications. Mediastinum/Nodes: No mediastinal, hilar, or axillary adenopathy. Trachea and esophagus are unremarkable. Thyroid unremarkable. Lungs/Pleura: Lungs are clear. No focal airspace opacities or suspicious nodules. No effusions. Musculoskeletal: Chest wall soft tissues are unremarkable. No acute bony abnormality. CT ABDOMEN PELVIS FINDINGS Hepatobiliary: Diffuse low-density throughout the liver compatible with fatty infiltration. No focal abnormality. Gallbladder unremarkable. Pancreas: No focal abnormality or ductal dilatation. Spleen: No focal abnormality.  Normal size. Adrenals/Urinary Tract: Exophytic cyst off the lower pole of the left kidney measures up to 4.5 cm, stable. Exophytic cyst anteriorly off the lower pole of the left kidney  also stable. No renal or ureteral stones. No hydronephrosis. Adrenal glands and urinary bladder unremarkable. Stomach/Bowel: Few scattered colonic diverticula. Stomach, large and small bowel grossly unremarkable. Vascular/Lymphatic: Aortic atherosclerosis. Prominent left inguinal lymph node has a short axis diameter of 14 mm. No other enlarged abdominal or pelvic lymph nodes. Reproductive: No visible focal abnormality. Other: No free fluid or free air. Musculoskeletal: No acute bony abnormality. IMPRESSION: No acute cardiopulmonary disease. Aortic atherosclerosis, coronary artery disease. Diffuse fatty infiltration of the liver. No renal or ureteral stones. No hydronephrosis. Stable left renal cysts. Prominent left inguinal lymph node with a short axis diameter of 1.4 cm. This could be followed clinically to ensure stability. Electronically Signed   By: Charlett NoseKevin  Dover M.D.  On: 11/07/2018 10:38   Ct Angio Chest Pe W Or Wo Contrast  Result Date: 11/08/2018 CLINICAL DATA:  Increased shortness of breath EXAM: CT ANGIOGRAPHY CHEST WITH CONTRAST TECHNIQUE: Multidetector CT imaging of the chest was performed using the standard protocol during bolus administration of intravenous contrast. Multiplanar CT image reconstructions and MIPs were obtained to evaluate the vascular anatomy. CONTRAST:  OMNIPAQUE IOHEXOL 350 MG/ML SOLN COMPARISON:  CT chest dated 11/07/2018 FINDINGS: Cardiovascular: Initial contrast bolus demonstrates suboptimal contrast opacification, specifically in the bilateral lower lobes. Subsequent contrast bolus demonstrates improved contrast opacification to the lobar level. However, evaluation of the bilateral pulmonary arteries is constrained by respiratory motion, leading to artifact, involving the lung apices and bilateral lower lobes. Within that constraint, there is no evidence of pulmonary embolism. The heart is normal in size. No pericardial effusion. No evidence thoracic aortic aneurysm.  Atherosclerotic calcifications of the aortic arch. Mild coronary atherosclerosis of the LAD. Mediastinum/Nodes: No suspicious mediastinal lymphadenopathy. 8 mm short axis subcarinal node. Visualized left thyroid is unremarkable. Right thyroid is not visualized, presumably surgically absent. Lungs/Pleura: Evaluation of the lung parenchyma is constrained by respiratory motion. Within that constraint, there no suspicious pulmonary nodules. Scattered areas of ground-glass opacity likely reflect expiratory imaging. This appearance is not considered suspicious for interstitial edema. No focal consolidation. No pleural effusion or pneumothorax. Upper Abdomen: Visualized upper abdomen is grossly unremarkable. Musculoskeletal: Degenerative changes of the visualized thoracolumbar spine. Review of the MIP images confirms the above findings. IMPRESSION: Limited evaluation, as above. No evidence of pulmonary embolism. When accounting for respiratory motion, there is no interval change from recent CT. Aortic Atherosclerosis (ICD10-I70.0). Electronically Signed   By: Charline Bills M.D.   On: 11/08/2018 12:10   Mr Foot Left Wo Contrast  Result Date: 11/08/2018 CLINICAL DATA:  Foot swelling, diabetic, swelling and redness EXAM: MRI OF THE LEFT FOOT WITHOUT CONTRAST TECHNIQUE: Multiplanar, multisequence MR imaging of the left forefoot was performed. No intravenous contrast was administered. COMPARISON:  Radiograph same day FINDINGS: Bones/Joint/Cartilage Normal marrow signal is seen throughout. No areas of periosteal reaction, cortical destruction or a vascular necrosis. No fractures seen. No large joint effusions are noted. Mild first MTP joint osteoarthritis is noted. Ligaments The Lisfranc ligament is intact. The collateral ligaments appear to be intact. Muscles and Tendons There is increased feathery signal seen within the abductor and adductor hallucis muscle belly and flexor digitorum brevis. There is small amount of  fluid seen surrounding it extending to the myotendinous junction of the abductor hallucis muscle. There is also mildly increased feathery signal seen within the plantar surface of the intrinsic musculature. The flexor and extensor tendons appear to be intact. Soft tissues Diffuse dorsal soft tissue edema is noted. No focal fluid collection or sinus tract is seen. There is mild skin thickening seen along the plantar medial surface of the forefoot. IMPRESSION: 1. Myositis/muscular strain involving the abductor and adductor hallucis muscle bellies as well as the flexor digitorum brevis. 2. There is also mild muscular edema seen within the intrinsic muscles of the forefoot. 3. No evidence of osteomyelitis or soft tissue abscess. Electronically Signed   By: Jonna Clark M.D.   On: 11/08/2018 19:50   Dg Chest Portable 1 View  Result Date: 11/07/2018 CLINICAL DATA:  Fever EXAM: PORTABLE CHEST 1 VIEW COMPARISON:  04/03/2006 FINDINGS: The heart size and mediastinal contours are within normal limits. Both lungs are clear. The visualized skeletal structures are unremarkable. IMPRESSION: No acute abnormality of the lungs in  AP portable projection. Electronically Signed   By: Lauralyn Primes M.D.   On: 11/07/2018 10:52   Dg Foot Complete Left  Result Date: 11/08/2018 CLINICAL DATA:  63 year old male with history of ulcer on the left foot. EXAM: LEFT FOOT - COMPLETE 3+ VIEW COMPARISON:  No priors. FINDINGS: There is no evidence of fracture or dislocation. There is no evidence of inflammatory arthropathy or other focal bone abnormality. Soft tissues are unremarkable. IMPRESSION: Negative. Electronically Signed   By: Trudie Reed M.D.   On: 11/08/2018 11:43   Ct Renal Stone Study  Result Date: 11/07/2018 CLINICAL DATA:  Fever of unknown origin. EXAM: CT CHEST, ABDOMEN AND PELVIS WITHOUT CONTRAST TECHNIQUE: Multidetector CT imaging of the chest, abdomen and pelvis was performed following the standard protocol without  IV contrast. COMPARISON:  11/14/2017 CT abdomen and pelvis. FINDINGS: CT CHEST FINDINGS Cardiovascular: Heart is normal size. Aorta is normal caliber. Scattered aortic and coronary artery calcifications. Mediastinum/Nodes: No mediastinal, hilar, or axillary adenopathy. Trachea and esophagus are unremarkable. Thyroid unremarkable. Lungs/Pleura: Lungs are clear. No focal airspace opacities or suspicious nodules. No effusions. Musculoskeletal: Chest wall soft tissues are unremarkable. No acute bony abnormality. CT ABDOMEN PELVIS FINDINGS Hepatobiliary: Diffuse low-density throughout the liver compatible with fatty infiltration. No focal abnormality. Gallbladder unremarkable. Pancreas: No focal abnormality or ductal dilatation. Spleen: No focal abnormality.  Normal size. Adrenals/Urinary Tract: Exophytic cyst off the lower pole of the left kidney measures up to 4.5 cm, stable. Exophytic cyst anteriorly off the lower pole of the left kidney also stable. No renal or ureteral stones. No hydronephrosis. Adrenal glands and urinary bladder unremarkable. Stomach/Bowel: Few scattered colonic diverticula. Stomach, large and small bowel grossly unremarkable. Vascular/Lymphatic: Aortic atherosclerosis. Prominent left inguinal lymph node has a short axis diameter of 14 mm. No other enlarged abdominal or pelvic lymph nodes. Reproductive: No visible focal abnormality. Other: No free fluid or free air. Musculoskeletal: No acute bony abnormality. IMPRESSION: No acute cardiopulmonary disease. Aortic atherosclerosis, coronary artery disease. Diffuse fatty infiltration of the liver. No renal or ureteral stones. No hydronephrosis. Stable left renal cysts. Prominent left inguinal lymph node with a short axis diameter of 1.4 cm. This could be followed clinically to ensure stability. Electronically Signed   By: Charlett Nose M.D.   On: 11/07/2018 10:38   Ct Head Code Stroke Wo Contrast  Result Date: 11/07/2018 CLINICAL DATA:  Code stroke.   Speech difficulty.  Possible stroke. EXAM: CT HEAD WITHOUT CONTRAST TECHNIQUE: Contiguous axial images were obtained from the base of the skull through the vertex without intravenous contrast. COMPARISON:  Brain MRI 11/17/2012 FINDINGS: Brain: No evidence of acute infarction, hemorrhage, hydrocephalus, extra-axial collection or mass lesion/mass effect. Vascular: No hyperdense vessel or unexpected calcification. Skull: Normal. Negative for fracture or focal lesion. Sinuses/Orbits: No acute finding. Other: These results were called by telephone at the time of interpretation on 11/07/2018 at 10:32 am to provider [facility] , who verbally acknowledged these results. ASPECTS [facility] Stroke Program Early CT Score) - Ganglionic level infarction (caudate, lentiform nuclei, internal capsule, insula, M1-M3 cortex): 7 - Supraganglionic infarction (M4-M6 cortex): 3 Total score (0-10 with 10 being normal): 10 IMPRESSION: Negative head CT. ASPECTS is 10. Electronically Signed   By: Marnee Spring M.D.   On: 11/07/2018 10:35   Dg Lumbar Puncture Fluoro Guide  Result Date: 11/07/2018 CLINICAL DATA:  Meningitis.  Fever and stroke symptoms. EXAM: DIAGNOSTIC LUMBAR PUNCTURE UNDER FLUOROSCOPIC GUIDANCE FLUOROSCOPY TIME:  Fluoroscopy Time:  1 minutes 42 seconds Radiation Exposure  Index (if provided by the fluoroscopic device): 72.3 mGy Number of Acquired Spot Images: 1 PROCEDURE: After discussing the risks and benefits of this procedure with the patient informed consent was obtained. The back was sterilely prepped and draped. Following local anesthesia with 1% lidocaine a 22 gauge spinal needle was advanced into the lumbar spine thecal sac at the L4-L5 level under fluoroscopic guidance . 6 cc of CSF obtained. Initial CSF was slightly blood-tinged, this cleared. CSF was sent to the lab for further evaluation. Hemostasis achieved following needle removal. There no complications. IMPRESSION: Successful fluoroscopically directed  lumbar puncture. CSF samples sent to the laboratory for further evaluation. Electronically Signed   By: Maisie Fus  Register   On: 11/07/2018 13:39     CBC Recent Labs  Lab 11/07/18 1005 11/08/18 0427 11/09/18 0447  WBC 10.7* 10.1 8.2  HGB 14.2 12.1* 11.6*  HCT 43.0 37.1* 35.2*  PLT 117* 96* 99*  MCV 82.4 83.2 81.5  MCH 27.2 27.1 26.9  MCHC 33.0 32.6 33.0  RDW 14.1 14.5 14.5  LYMPHSABS 1.0  --   --   MONOABS 0.5  --   --   EOSABS 0.1  --   --   BASOSABS 0.1  --   --     Chemistries  Recent Labs  Lab 11/07/18 1005 11/08/18 0427 11/09/18 0447  NA 131* 136 136  K 3.9 3.8 3.2*  CL 95* 105 101  CO2 17* 24 25  GLUCOSE 206* 193* 157*  BUN 25* 23 20  CREATININE 1.82* 1.41* 1.21  CALCIUM 8.7* 8.3* 8.3*  MG  --  2.1  --   AST 44*  --   --   ALT 27  --   --   ALKPHOS 61  --   --   BILITOT 1.3*  --   --    ------------------------------------------------------------------------------------------------------------------ estimated creatinine clearance is 83.8 mL/min (by C-G formula based on SCr of 1.21 mg/dL). ------------------------------------------------------------------------------------------------------------------ Recent Labs    11/07/18 1005  HGBA1C 6.7*   ------------------------------------------------------------------------------------------------------------------ Recent Labs    11/09/18 0447  CHOL 120  HDL 12*  LDLCALC 60  TRIG 161*  CHOLHDL 10.0   ------------------------------------------------------------------------------------------------------------------ Recent Labs    11/08/18 0427  TSH 0.522   ------------------------------------------------------------------------------------------------------------------ No results for input(s): VITAMINB12, FOLATE, FERRITIN, TIBC, IRON, RETICCTPCT in the last 72 hours.  Coagulation profile Recent Labs  Lab 11/07/18 1005  INR 1.2    No results for input(s): DDIMER in the last 72 hours.  Cardiac  Enzymes No results for input(s): CKMB, TROPONINI, MYOGLOBIN in the last 168 hours.  Invalid input(s): CK ------------------------------------------------------------------------------------------------------------------ Invalid input(s): POCBNP    Assessment & Plan   Patient is a 63 year old male with history of hypertension, borderline diabetes mellitus, hyperlipidemia, obstructive sleep apnea for which patient uses CPAP at night and hypothyroidism being admitted for suspected meningitis and resolved neurological symptoms.  1.    Sepsis source felt to be due to foot  , UA shows no evidence of UTI Patient's antibiotics changed to Zosyn Blood cultures positive for gram-negative bacteria Cultures from wound multiorganism   2.  Resolving neurological symptoms. Appreciate neurology input   3.  Hypertension Patient has hypertension blood pressure medications on hold  4.  Borderline diabetes mellitus Holding off on Metformin Continue sliding scale insulin  5.  Acute kidney injury Improved with IV fluid  6.  Hypothyroidism Continue Synthroid.  TSH level in a.m.  7.  Fluid overload no evidence of congestive heart failure received IV Lasix  8.  Sleep apnea continue CPAP  9.  Very low HDL discussed with patient regarding exercise diet may need treatment  All the records are reviewed and case discussed with ED provider. Management plans discussed with the patient, family and they are in agreement. Updated wife present at bedside and treatment plans as outlined above   CODE STATUS: Full code  TOTAL TIME TAKING CARE OF THIS PATIENT: 35 minutes.    Jude Ojie M.D on 11/07/2018 at 3:18 PM  Between 7am to 6pm - Pager - [phone]  After 6pm go to [email or web address] - Scientist, research (life sciences) [facility]  Office  [phone]  CC: Primary care physician; Mickey Farber, MD   Note: This dictation was prepared with Dragon dictation along  with smaller phrase technology. Any transcriptional errors that result from this process are unintentional.        Routing History                 Code Status Orders  (From admission, onward)         Start     Ordered   11/07/18 1436  Full code  Continuous     11/07/18 1436        Code Status History    This patient has a current code status but no historical code status.   Advance Care Planning Activity           Consults none  DVT Prophylaxis  Lovenox   Lab Results  Component Value Date   PLT 99 (L) 11/09/2018     Time Spent in minutes   Greater than 50% of time spent in care coordination and counseling patient regarding the condition and plan of care.   Auburn Bilberry M.D on 11/09/2018 at 11:28 AM  Between 7am to 6pm - Pager - [phone]  After 6pm go to [email or web address] - Social research officer, government  [employer]   Office  [phone]

## 2018-11-09 NOTE — Anesthesia Preprocedure Evaluation (Signed)
Anesthesia Evaluation  Patient identified by MRN, date of birth, ID band Patient awake    Reviewed: Allergy & Precautions, H&P , NPO status , Patient's Chart, lab work & pertinent test results  History of Anesthesia Complications Negative for: history of anesthetic complications  Airway Mallampati: III  TM Distance: <3 FB Neck ROM: full    Dental  (+) Chipped   Pulmonary neg shortness of breath, sleep apnea ,           Cardiovascular Exercise Tolerance: Good hypertension, (-) angina(-) Past MI and (-) DOE      Neuro/Psych PSYCHIATRIC DISORDERS  Neuromuscular disease    GI/Hepatic Neg liver ROS, GERD  Medicated and Controlled,  Endo/Other  diabetes, Type 2Hypothyroidism   Renal/GU      Musculoskeletal   Abdominal   Peds  Hematology negative hematology ROS (+)   Anesthesia Other Findings Past Medical History: No date: Carpal tunnel syndrome on both sides No date: Depression No date: Diabetes mellitus without complication (HCC) No date: GERD (gastroesophageal reflux disease) No date: Hyperlipidemia No date: Hypertension No date: Hypothyroidism No date: Sleep apnea     Comment:  CPAP No date: Wears hearing aid in both ears  Past Surgical History: No date: APPENDECTOMY 11/2003: CARPAL TUNNEL RELEASE; Right 11/19/2017: CARPAL TUNNEL RELEASE; Left     Comment:  Procedure: LEFT OPEN CARPAL TUNNEL RELEASE;  Surgeon:               Leanor Kail, MD;  Location: [facility];                Service: Orthopedics;  Laterality: Left;  Diabetic - oral              meds Sleep Apnea No date: COLONOSCOPY     Comment:  2/91, 6/00, 01/04, 7/08, 10/10, 12/13, 02/26/17 No date: ESOPHAGOGASTRODUODENOSCOPY     Comment:  2/91, 1/95, 6/00, 1/04, 7/08, 10/10 06/2007: EXCISION NEUROMA; Left     Comment:  foot No date: FINGER SURGERY     Comment:  for congenital webbing No date: MANDIBLE SURGERY     Comment:  in late  33s, fo overbite 12/2015: PROSTATE BIOPSY 02/2005: ROTATOR CUFF REPAIR; Left No date: THYROIDECTOMY, PARTIAL No date: TONSILLECTOMY     Comment:  and adenoidectomy  BMI    Body Mass Index: 36.09 kg/m      Reproductive/Obstetrics negative OB ROS                             Anesthesia Physical Anesthesia Plan  ASA: IV  Anesthesia Plan: General LMA   Post-op Pain Management:    Induction: Intravenous  PONV Risk Score and Plan: Dexamethasone, Ondansetron, Midazolam and Treatment may vary due to age or medical condition  Airway Management Planned: LMA  Additional Equipment:   Intra-op Plan:   Post-operative Plan: Extubation in OR  Informed Consent: I have reviewed the patients History and Physical, chart, labs and discussed the procedure including the risks, benefits and alternatives for the proposed anesthesia with the patient or authorized representative who has indicated his/her understanding and acceptance.     Dental Advisory Given  Plan Discussed with: Anesthesiologist, CRNA and Surgeon  Anesthesia Plan Comments: (Patient consented for risks of anesthesia including but not limited to:  - adverse reactions to medications - damage to teeth, lips or other oral mucosa - sore throat or hoarseness - Damage to heart, brain, lungs or loss of life  Patient voiced understanding.)        Anesthesia Quick Evaluation

## 2018-11-09 NOTE — Consult Note (Addendum)
ANTICOAGULATION CONSULT NOTE - Initial Consult  Pharmacy Consult for Heparin Drip Indication: chest pain/ACS/STEMI  Allergies  Allergen Reactions  . Lamictal [Lamotrigine] Hives  . Statins     Muscle/joint aches   Patient Measurements: Height: 6' (182.9 cm) Weight: 266 lb 1.5 oz (120.7 kg) IBW/kg (Calculated) : 77.6 Heparin Dosing Weight: 104.1kg  Vital Signs: Temp: 98.3 F (36.8 C) (10/18 0611) Temp Source: Oral (10/18 0611) BP: 121/81 (10/18 0611) Pulse Rate: 75 (10/18 0611)  Labs: Recent Labs    11/07/18 1005 11/08/18 0427 11/08/18 1047 11/08/18 1238 11/08/18 2255 11/09/18 0447 11/09/18 0722  HGB 14.2 12.1*  --   --   --  11.6*  --   HCT 43.0 37.1*  --   --   --  35.2*  --   PLT 117* 96*  --   --   --  99*  --   APTT 35  --   --   --   --   --   --   LABPROT 15.3*  --   --   --   --   --   --   INR 1.2  --   --   --   --   --   --   HEPARINUNFRC  --   --   --   --  <0.10*  --  <0.10*  CREATININE 1.82* 1.41*  --   --   --  1.21  --   TROPONINIHS  --   --  112* 268*  --   --   --     Estimated Creatinine Clearance: 83.8 mL/min (by C-G formula based on SCr of 1.21 mg/dL).  Medical History: Past Medical History:  Diagnosis Date  . Carpal tunnel syndrome on both sides   . Depression   . Diabetes mellitus without complication ([place])   . GERD (gastroesophageal reflux disease)   . Hyperlipidemia   . Hypertension   . Hypothyroidism   . Sleep apnea    CPAP  . Wears hearing aid in both ears     Medications:  No PTA anticoagulant of record-  As inpatient pt received single Lovenox 40mg  sq dose 10/17 @ 0846  Assessment: 63 y o male being treated for probable meningitis now exhibiting cardiac symptoms with elevated troponin.  Pharmacy has been consulted to initiate/monitor heparin drip.   Goal of Therapy:  Heparin level 0.3-0.7 units/ml Monitor platelets by anticoagulation protocol: Yes   Plan:  10/18 0722 HL < 0.1, subtherapeutic. Hg, plt downtrending.  Per RN, no evidence of bleeding. Patient has received a significant amount of fluids in the past couple of days, may reflect dilution. Will be slightly less aggressive with increasing the drip at this time and continue to monitor for bleeding. Will give heparin 2000 unit bolus and increase heparin drip to 1900 units/hr. HL at 1600.  ADDENDUM: Heparin drip discontinued by Dr. Ubaldo Glassing.   Tawnya Crook, PharmD Clinical Pharmacist 11/09/2018 8:58 AM

## 2018-11-09 NOTE — Op Note (Signed)
Date of operation: 11/09/2018.  Surgeon: Durward Fortes D.P.M.  Preoperative diagnosis: Abscess left forefoot.  Postoperative diagnosis: Same.  Procedure: I&D abscess left forefoot, multiple incisions.  Anesthesia: LMA.  Hemostasis: Pneumatic tourniquet left ankle 250 mmHg.  Estimated blood loss: Less than 5 cc.  Injectables: 10 cc 0.5% Marcaine plain.  Drains: Half-inch iodoform packing into the plantar wound.  Complications: None apparent.  Operative indications: This is a 63 year old male with recent admission to the hospital.  Was noted to have cellulitis with abscess in his left forefoot.  Does relate a history of possibly stepping on something a couple of months ago.  Operative procedure: Patient was taken to the operating room and placed on the table in the supine position.  Following satisfactory LMA anesthesia a pneumatic tourniquet was applied at the level of the left ankle and the foot was prepped and draped in the usual sterile fashion.  The foot was exsanguinated and the tourniquet inflated to 250 mmHg.    Attention was then directed to the plantar ulceration on the left foot where an approximate 2.5 cm incision was made through the ulceration extending distally into the plantar interspace as well as slightly proximal to the ulceration.  The incision was deepened via sharp and blunt dissection.  There was noted to be some mild devitalized tissue at the base of the wound which did extend medially beneath the first metatarsal area as well as towards the second interspace.  This was excisionally debrided sharply using a rongeur and scissors.  There was noted to be some probing deep into the interspace towards the dorsal aspect so a second incision was made dorsally approximately 1 cm diameter ending in the interspace.  The 2 areas were then connected via blunt dissection.  No significant expressible pockets of purulence were noted.  The wounds were then debrided with a versa jet on  a setting of 7 for the plantar and dorsal incision areas followed by irrigation of the wound using a pulse lavage irrigator with the remainder of the 3 L bag.  Dorsal incision closed using 3-0 nylon simple erupted sutures followed by the proximal and distal aspect of the plantar incision closed using 3-0 nylon simple interrupted sutures with the central most aspect left open for packing which was packed with iodoform gauze.  10 cc of 0.5% Marcaine plain was then injected for postoperative anesthesia.  Xeroform 4 x 4's ABDs and Kerlix applied to the left lower extremity.  The tourniquet was released and a second Kerlix and Ace wrap were then applied.  Patient was awakened and transported to the PACU having tolerated the procedure and anesthesia well.

## 2018-11-09 NOTE — Interval H&P Note (Signed)
History and Physical Interval Note:  11/09/2018 4:03 PM  Jacob Lawson  has presented today for surgery, with the diagnosis of left foot wound.  The various methods of treatment have been discussed with the patient and family. After consideration of risks, benefits and other options for treatment, the patient has consented to  Procedure(s): IRRIGATION AND DEBRIDEMENT FOOT (Left) as a surgical intervention.  The patient's history has been reviewed, patient examined, no change in status, stable for surgery.  I have reviewed the patient's chart and labs.  Questions were answered to the patient's satisfaction.     Durward Fortes

## 2018-11-09 NOTE — Consult Note (Signed)
Subjective: No focal deficits, s/p shaking episode yesterday   Past Medical History:  Diagnosis Date  . Carpal tunnel syndrome on both sides   . Depression   . Diabetes mellitus without complication (HCC)   . GERD (gastroesophageal reflux disease)   . Hyperlipidemia   . Hypertension   . Hypothyroidism   . Sleep apnea    CPAP  . Wears hearing aid in both ears     Past Surgical History:  Procedure Laterality Date  . APPENDECTOMY    . CARPAL TUNNEL RELEASE Right 11/2003  . CARPAL TUNNEL RELEASE Left 11/19/2017   Procedure: LEFT OPEN CARPAL TUNNEL RELEASE;  Surgeon: Erin Sons, MD;  Location: [facility];  Service: Orthopedics;  Laterality: Left;  Diabetic - oral meds Sleep Apnea  . COLONOSCOPY     2/91, 6/00, 01/04, 7/08, 10/10, 12/13, 02/26/17  . ESOPHAGOGASTRODUODENOSCOPY     2/91, 1/95, 6/00, 1/04, 7/08, 10/10  . EXCISION NEUROMA Left 06/2007   foot  . FINGER SURGERY     for congenital webbing  . MANDIBLE SURGERY     in late 30s, fo overbite  . PROSTATE BIOPSY  12/2015  . ROTATOR CUFF REPAIR Left 02/2005  . THYROIDECTOMY, PARTIAL    . TONSILLECTOMY     and adenoidectomy    History reviewed. No pertinent family history.  Social History:  reports that he has never smoked. He has never used smokeless tobacco. He reports current alcohol use. He reports that he does not use drugs.  Allergies  Allergen Reactions  . Lamictal [Lamotrigine] Hives  . Statins     Muscle/joint aches    Medications: I have reviewed the patient's current medications.   Physical Examination: Blood pressure 121/81, pulse 75, temperature 98.3 F (36.8 C), temperature source Oral, resp. rate 16, height 6' (1.829 m), weight 120.7 kg, SpO2 97 %.   Neurological Examination   Mental Status: Alert, oriented, thought content appropriate.  Possible dysarthria.  Cranial Nerves: II: Discs flat bilaterally; Visual fields grossly normal, pupils equal, round, reactive to light and  accommodation III,IV, VI: ptosis not present, extra-ocular motions intact bilaterally V,VII: smile symmetric, facial light touch sensation normal bilaterally VIII: hearing normal bilaterally IX,X: gag reflex present XI: bilateral shoulder shrug XII: midline tongue extension Motor: Right : Upper extremity   5/5    Left:     Upper extremity   5/5  Lower extremity   5/5     Lower extremity   5/5 Tone and bulk:normal tone throughout; no atrophy noted Sensory: Pinprick and light touch intact throughout, bilaterally Deep Tendon Reflexes: 1+ and symmetric throughout Plantars: Right: downgoing   Left: downgoing Cerebellar: Not tested      Laboratory Studies:   Basic Metabolic Panel: Recent Labs  Lab 11/07/18 1005 11/08/18 0427 11/09/18 0447  NA 131* 136 136  K 3.9 3.8 3.2*  CL 95* 105 101  CO2 17* 24 25  GLUCOSE 206* 193* 157*  BUN 25* 23 20  CREATININE 1.82* 1.41* 1.21  CALCIUM 8.7* 8.3* 8.3*  MG  --  2.1  --     Liver Function Tests: Recent Labs  Lab 11/07/18 1005  AST 44*  ALT 27  ALKPHOS 61  BILITOT 1.3*  PROT 8.1  ALBUMIN 4.1   No results for input(s): LIPASE, AMYLASE in the last 168 hours. No results for input(s): AMMONIA in the last 168 hours.  CBC: Recent Labs  Lab 11/07/18 1005 11/08/18 0427 11/09/18 0447  WBC 10.7* 10.1 8.2  NEUTROABS 8.9*  --   --   HGB 14.2 12.1* 11.6*  HCT 43.0 37.1* 35.2*  MCV 82.4 83.2 81.5  PLT 117* 96* 99*    Cardiac Enzymes: No results for input(s): CKTOTAL, CKMB, CKMBINDEX, TROPONINI in the last 168 hours.  BNP: Invalid input(s): POCBNP  CBG: Recent Labs  Lab 11/08/18 0758 11/08/18 1232 11/08/18 1646 11/08/18 2033 11/09/18 0815  GLUCAP 114* 143* 130* 145* 118*    Microbiology: Results for orders placed or performed during the hospital encounter of 11/07/18  Blood culture (routine x 2)     Status: None (Preliminary result)   Collection Time: 11/07/18 10:05 AM   Specimen: BLOOD  Result Value Ref Range  Status   Specimen Description BLOOD BLOOD RIGHT FOREARM  Final   Special Requests   Final    BOTTLES DRAWN AEROBIC AND ANAEROBIC Blood Culture adequate volume   Culture   Final    NO GROWTH 2 DAYS Performed at [facility], [address] [place], [place] [address]    Report Status PENDING  Incomplete  Blood culture (routine x 2)     Status: Abnormal (Preliminary result)   Collection Time: 11/07/18 10:05 AM   Specimen: BLOOD  Result Value Ref Range Status   Specimen Description   Final    BLOOD BLOOD LEFT FOREARM Performed at [facility], [address] [place], [place] [address]    Special Requests   Final    BOTTLES DRAWN AEROBIC AND ANAEROBIC Blood Culture adequate volume Performed at [facility], [address], [place], [place] [address]    Culture  Setup Time   Final    GRAM NEGATIVE RODS IN BOTH AEROBIC AND ANAEROBIC BOTTLES CRITICAL RESULT CALLED TO, READ BACK BY AND VERIFIED WITH: Sternberg HALL @506  11/08/2018 TTG    Culture (A)  Final    PROTEUS MIRABILIS SUSCEPTIBILITIES TO FOLLOW Performed at [facility], [place] [address], [place], [place] [address]    Report Status PENDING  Incomplete  Blood Culture ID Panel (Reflexed)     Status: Abnormal   Collection Time: 11/07/18 10:05 AM  Result Value Ref Range Status   Enterococcus species NOT DETECTED NOT DETECTED Final   Listeria monocytogenes NOT DETECTED NOT DETECTED Final   Staphylococcus species NOT DETECTED NOT DETECTED Final   Staphylococcus aureus (BCID) NOT DETECTED NOT DETECTED Final   Streptococcus species NOT DETECTED NOT DETECTED Final   Streptococcus agalactiae NOT DETECTED NOT DETECTED Final   Streptococcus pneumoniae NOT DETECTED NOT DETECTED Final   Streptococcus pyogenes NOT DETECTED NOT DETECTED Final   Acinetobacter baumannii NOT DETECTED NOT DETECTED Final   Enterobacteriaceae species DETECTED (A) NOT DETECTED Final    Comment: Enterobacteriaceae represent a  large family of gram-negative bacteria, not a single organism. CRITICAL RESULT CALLED TO, READ BACK BY AND VERIFIED WITH: Dante HALL @506  11/08/2018 TTG    Enterobacter cloacae complex NOT DETECTED NOT DETECTED Final   Escherichia coli NOT DETECTED NOT DETECTED Final   Klebsiella oxytoca NOT DETECTED NOT DETECTED Final   Klebsiella pneumoniae NOT DETECTED NOT DETECTED Final   Proteus species DETECTED (A) NOT DETECTED Final    Comment: CRITICAL RESULT CALLED TO, READ BACK BY AND VERIFIED WITH: Bounds HALL @506  11/08/2018 TTG    Serratia marcescens NOT DETECTED NOT DETECTED Final   Carbapenem resistance NOT DETECTED NOT DETECTED Final   Haemophilus influenzae NOT DETECTED NOT DETECTED Final   Neisseria meningitidis NOT DETECTED NOT DETECTED Final   Pseudomonas  aeruginosa NOT DETECTED NOT DETECTED Final   Candida albicans NOT DETECTED NOT DETECTED Final   Candida glabrata NOT DETECTED NOT DETECTED Final   Candida krusei NOT DETECTED NOT DETECTED Final   Candida parapsilosis NOT DETECTED NOT DETECTED Final   Candida tropicalis NOT DETECTED NOT DETECTED Final    Comment: Performed at [facility], [address] [place], [place] [address]  SARS Coronavirus 2 by RT PCR (hospital order, performed in [facility] hospital lab) Nasopharyngeal Nasopharyngeal Swab     Status: None   Collection Time: 11/07/18 10:40 AM   Specimen: Nasopharyngeal Swab  Result Value Ref Range Status   SARS Coronavirus 2 NEGATIVE NEGATIVE Final    Comment: (NOTE) If result is NEGATIVE SARS-CoV-2 target nucleic acids are NOT DETECTED. The SARS-CoV-2 RNA is generally detectable in upper and lower  respiratory specimens during the acute phase of infection. The lowest  concentration of SARS-CoV-2 viral copies this assay can detect is 250  copies / mL. A negative result does not preclude SARS-CoV-2 infection  and should not be used as the sole basis for treatment or other  patient management decisions.  A  negative result may occur with  improper specimen collection / handling, submission of specimen other  than nasopharyngeal swab, presence of viral mutation(s) within the  areas targeted by this assay, and inadequate number of viral copies  (<250 copies / mL). A negative result must be combined with clinical  observations, patient history, and epidemiological information. If result is POSITIVE SARS-CoV-2 target nucleic acids are DETECTED. The SARS-CoV-2 RNA is generally detectable in upper and lower  respiratory specimens dur ing the acute phase of infection.  Positive  results are indicative of active infection with SARS-CoV-2.  Clinical  correlation with patient history and other diagnostic information is  necessary to determine patient infection status.  Positive results do  not rule out bacterial infection or co-infection with other viruses. If result is PRESUMPTIVE POSTIVE SARS-CoV-2 nucleic acids MAY BE PRESENT.   A presumptive positive result was obtained on the submitted specimen  and confirmed on repeat testing.  While 2019 novel coronavirus  (SARS-CoV-2) nucleic acids may be present in the submitted sample  additional confirmatory testing may be necessary for epidemiological  and / or clinical management purposes  to differentiate between  SARS-CoV-2 and other Sarbecovirus currently known to infect humans.  If clinically indicated additional testing with an alternate test  methodology [phone]([id number]) is advised. The SARS-CoV-2 RNA is generally  detectable in upper and lower respiratory sp ecimens during the acute  phase of infection. The expected result is Negative. Fact Sheet for Patients:  BoilerBrush.com.cyhttps://www.fda.gov/media/136312/download Fact Sheet for Healthcare Providers: https://pope.com/https://www.fda.gov/media/136313/download This test is not yet approved or cleared by the [place] FDA and has been authorized for detection and/or diagnosis of SARS-CoV-2 by FDA under an Emergency Use  Authorization (EUA).  This EUA will remain in effect (meaning this test can be used) for the duration of the COVID-19 declaration under Section 564(b)(1) of the Act, 21 U.S.C. section 360bbb-3(b)(1), unless the authorization is terminated or revoked sooner. Performed at [facility], [address] [place], [place] [address]   CSF culture     Status: None (Preliminary result)   Collection Time: 11/07/18 12:50 PM   Specimen: CSF; Cerebrospinal Fluid  Result Value Ref Range Status   Specimen Description   Final    CSF Performed at [facility], [address] [place], [place] [address]    Special Requests  Final    NONE Performed at [facility], [address] [place], [place] [address]    Gram Stain   Final    WBC SEEN RED BLOOD CELLS NO ORGANISMS SEEN Performed at [facility], [address] [place], [place] [address]    Culture   Final    NO GROWTH < 24 HOURS Performed at [facility], [address], [place], [place] [address]    Report Status PENDING  Incomplete  Culture, fungus without smear     Status: None (Preliminary result)   Collection Time: 11/07/18  1:10 PM   Specimen: PATH Cytology CSF; Cerebrospinal Fluid  Result Value Ref Range Status   Specimen Description   Final    CSF Performed at [facility], [address] [place], [place] [address]    Special Requests   Final    NONE Performed at [facility], [address] [place], [place] [address]    Culture   Final    NO FUNGUS ISOLATED AFTER 1 DAY Performed at [facility], [address], [place], [place] [address]    Report Status PENDING  Incomplete  Urine Culture     Status: None   Collection Time: 11/07/18  3:41 PM   Specimen: Urine, Random  Result Value Ref Range Status   Specimen Description   Final    URINE, RANDOM Performed at [facility], [address], [place], [place] [address]    Special  Requests   Final    NONE Performed at [facility], [address] [place], [place] [address]    Culture   Final    NO GROWTH Performed at [facility], [address], [place], [place] [address]    Report Status 11/09/2018 FINAL  Final  Aerobic/Anaerobic Culture (surgical/deep wound)     Status: None (Preliminary result)   Collection Time: 11/08/18  2:25 PM   Specimen: Wound; Abscess  Result Value Ref Range Status   Specimen Description   Final    WOUND LEFT FOOT Performed at [facility], [address] [place], [place] [address]    Special Requests   Final    NONE Performed at [facility], [address] [place], [place] [address]    Gram Stain   Final    MODERATE WBC PRESENT, PREDOMINANTLY PMN MODERATE GRAM POSITIVE COCCI MODERATE GRAM NEGATIVE RODS    Culture   Final    ABUNDANT ENTEROCOCCUS FAECALIS SUSCEPTIBILITIES TO FOLLOW CULTURE REINCUBATED FOR BETTER GROWTH Performed at [facility], [address], [place], [place] [address]    Report Status PENDING  Incomplete    Coagulation Studies: Recent Labs    11/07/18 1005  LABPROT 15.3*  INR 1.2    Urinalysis:  Recent Labs  Lab 11/07/18 0959  COLORURINE YELLOW*  LABSPEC 1.015  PHURINE 5.0  GLUCOSEU >=500*  HGBUR MODERATE*  BILIRUBINUR NEGATIVE  KETONESUR NEGATIVE  PROTEINUR 100*  NITRITE NEGATIVE  LEUKOCYTESUR NEGATIVE    Lipid Panel:     Component Value Date/Time   CHOL 120 11/09/2018 0447   TRIG 239 (H) 11/09/2018 0447   HDL 12 (L) 11/09/2018 0447   CHOLHDL 10.0 11/09/2018 0447   VLDL 48 (H) 11/09/2018 0447   LDLCALC 60 11/09/2018 0447    HgbA1C:  Lab Results  Component Value Date   HGBA1C 6.7 (H) 11/07/2018    Urine Drug Screen:  No results found for:  LABOPIA, COCAINSCRNUR, LABBENZ, AMPHETMU, THCU, LABBARB  Alcohol Level: No results for input(s): ETH in the last 168 hours.   Imaging: Ct Angio Chest Pe W Or Wo Contrast  Result Date:  11/08/2018 CLINICAL DATA:  Increased shortness of breath EXAM: CT ANGIOGRAPHY CHEST WITH CONTRAST TECHNIQUE: Multidetector CT imaging of the chest was performed using the standard protocol during bolus administration of intravenous contrast. Multiplanar CT image reconstructions and MIPs were obtained to evaluate the vascular anatomy. CONTRAST:  OMNIPAQUE IOHEXOL 350 MG/ML SOLN COMPARISON:  CT chest dated 11/07/2018 FINDINGS: Cardiovascular: Initial contrast bolus demonstrates suboptimal contrast opacification, specifically in the bilateral lower lobes. Subsequent contrast bolus demonstrates improved contrast opacification to the lobar level. However, evaluation of the bilateral pulmonary arteries is constrained by respiratory motion, leading to artifact, involving the lung apices and bilateral lower lobes. Within that constraint, there is no evidence of pulmonary embolism. The heart is normal in size. No pericardial effusion. No evidence thoracic aortic aneurysm. Atherosclerotic calcifications of the aortic arch. Mild coronary atherosclerosis of the LAD. Mediastinum/Nodes: No suspicious mediastinal lymphadenopathy. 8 mm short axis subcarinal node. Visualized left thyroid is unremarkable. Right thyroid is not visualized, presumably surgically absent. Lungs/Pleura: Evaluation of the lung parenchyma is constrained by respiratory motion. Within that constraint, there no suspicious pulmonary nodules. Scattered areas of ground-glass opacity likely reflect expiratory imaging. This appearance is not considered suspicious for interstitial edema. No focal consolidation. No pleural effusion or pneumothorax. Upper Abdomen: Visualized upper abdomen is grossly unremarkable. Musculoskeletal: Degenerative changes of the visualized thoracolumbar spine. Review of the MIP images confirms the above findings. IMPRESSION: Limited evaluation, as above. No evidence of pulmonary embolism. When accounting for respiratory motion, there  is no interval change from recent CT. Aortic Atherosclerosis (ICD10-I70.0). Electronically Signed   By: Charline Bills M.D.   On: 11/08/2018 12:10   Mr Foot Left Wo Contrast  Result Date: 11/08/2018 CLINICAL DATA:  Foot swelling, diabetic, swelling and redness EXAM: MRI OF THE LEFT FOOT WITHOUT CONTRAST TECHNIQUE: Multiplanar, multisequence MR imaging of the left forefoot was performed. No intravenous contrast was administered. COMPARISON:  Radiograph same day FINDINGS: Bones/Joint/Cartilage Normal marrow signal is seen throughout. No areas of periosteal reaction, cortical destruction or a vascular necrosis. No fractures seen. No large joint effusions are noted. Mild first MTP joint osteoarthritis is noted. Ligaments The Lisfranc ligament is intact. The collateral ligaments appear to be intact. Muscles and Tendons There is increased feathery signal seen within the abductor and adductor hallucis muscle belly and flexor digitorum brevis. There is small amount of fluid seen surrounding it extending to the myotendinous junction of the abductor hallucis muscle. There is also mildly increased feathery signal seen within the plantar surface of the intrinsic musculature. The flexor and extensor tendons appear to be intact. Soft tissues Diffuse dorsal soft tissue edema is noted. No focal fluid collection or sinus tract is seen. There is mild skin thickening seen along the plantar medial surface of the forefoot. IMPRESSION: 1. Myositis/muscular strain involving the abductor and adductor hallucis muscle bellies as well as the flexor digitorum brevis. 2. There is also mild muscular edema seen within the intrinsic muscles of the forefoot. 3. No evidence of osteomyelitis or soft tissue abscess. Electronically Signed   By: Jonna Clark M.D.   On: 11/08/2018 19:50   Dg Foot Complete Left  Result Date: 11/08/2018 CLINICAL DATA:  63 year old male with history of ulcer on the left foot. EXAM: LEFT FOOT - COMPLETE 3+ VIEW  COMPARISON:  No priors.  FINDINGS: There is no evidence of fracture or dislocation. There is no evidence of inflammatory arthropathy or other focal bone abnormality. Soft tissues are unremarkable. IMPRESSION: Negative. Electronically Signed   By: Trudie Reed M.D.   On: 11/08/2018 11:43   Dg Lumbar Puncture Fluoro Guide  Result Date: 11/07/2018 CLINICAL DATA:  Meningitis.  Fever and stroke symptoms. EXAM: DIAGNOSTIC LUMBAR PUNCTURE UNDER FLUOROSCOPIC GUIDANCE FLUOROSCOPY TIME:  Fluoroscopy Time:  1 minutes 42 seconds Radiation Exposure Index (if provided by the fluoroscopic device): 72.3 mGy Number of Acquired Spot Images: 1 PROCEDURE: After discussing the risks and benefits of this procedure with the patient informed consent was obtained. The back was sterilely prepped and draped. Following local anesthesia with 1% lidocaine a 22 gauge spinal needle was advanced into the lumbar spine thecal sac at the L4-L5 level under fluoroscopic guidance . 6 cc of CSF obtained. Initial CSF was slightly blood-tinged, this cleared. CSF was sent to the lab for further evaluation. Hemostasis achieved following needle removal. There no complications. IMPRESSION: Successful fluoroscopically directed lumbar puncture. CSF samples sent to the laboratory for further evaluation. Electronically Signed   By: Maisie Fus  Register   On: 11/07/2018 13:39     Assessment/Plan:  63 y.o. male with hx of DM, HTN, HLD last known well around 9:45 this AM. Patient wen to his primary care for abdominal pain and fever.  He was noted to have L sided weakness and near inability to move it.  Patient was brought in as code stroke. By the time he on in CT symptoms improved and currently appear to be nearly resolved except for possible dysarthria.  Possibly NIHSS of 1. CTH no acute abnormalities.   - Back to baseline - shaking episode yesterday in setting of hypoxia - don't think stroke or seizure related.  - ASA daily  - No further imaging  from neurological stand point.     11/09/2018, 11:13 AM

## 2018-11-09 NOTE — Anesthesia Procedure Notes (Signed)
Procedure Name: LMA Insertion Performed by: Lesle Reek, CRNA Pre-anesthesia Checklist: Patient identified, Emergency Drugs available, Suction available, Patient being monitored and Timeout performed Patient Re-evaluated:Patient Re-evaluated prior to induction Oxygen Delivery Method: Circle system utilized Preoxygenation: Pre-oxygenation with 100% oxygen Induction Type: IV induction LMA: LMA inserted LMA Size: 4.0 Number of attempts: 1 Placement Confirmation: positive ETCO2,  CO2 detector and breath sounds checked- equal and bilateral Tube secured with: Tape

## 2018-11-09 NOTE — Progress Notes (Signed)
ID Chart reviewed and spoke to patient He feels better He has no fever or chills Had debridement of left foot at bed side by Dr.Cline and purulent stuff expressed- MRI does not show any abscess or osteo Myositis/muscular strain involving the abductor and adductor hallucis muscle bellies as well as the flexor digitorum brevis. 2. There is also mild muscular edema seen within the intrinsic muscles of the forefoot. 3. No evidence of osteomyelitis or soft tissue abscess.  Patient Vitals for the past 24 hrs:  BP Temp Temp src Pulse Resp SpO2  11/09/18 0611 121/81 98.3 F (36.8 C) Oral 75 16 97 %  11/08/18 2013 - - - - - 99 %  11/08/18 2004 139/89 98.8 F (37.1 C) Oral 87 18 99 %  11/08/18 1649 128/74 99 F (37.2 C) Oral 95 (!) 26 95 %  11/08/18 1103 - - - 100 (!) 22 99 %  11/08/18 1020 - - - 97 - 90 %    CBC Latest Ref Rng & Units 11/09/2018 11/08/2018 11/07/2018  WBC 4.0 - 10.5 K/uL 8.2 10.1 10.7(H)  Hemoglobin 13.0 - 17.0 g/dL 11.6(L) 12.1(L) 14.2  Hematocrit 39.0 - 52.0 % 35.2(L) 37.1(L) 43.0  Platelets 150 - 400 K/uL 99(L) 96(L) 117(L)    CMP Latest Ref Rng & Units 11/09/2018 11/08/2018 11/07/2018  Glucose 70 - 99 mg/dL 157(H) 193(H) 206(H)  BUN 8 - 23 mg/dL 20 23 25(H)  Creatinine 0.61 - 1.24 mg/dL 1.21 1.41(H) 1.82(H)  Sodium 135 - 145 mmol/L 136 136 131(L)  Potassium 3.5 - 5.1 mmol/L 3.2(L) 3.8 3.9  Chloride 98 - 111 mmol/L 101 105 95(L)  CO2 22 - 32 mmol/L 25 24 17(L)  Calcium 8.9 - 10.3 mg/dL 8.3(L) 8.3(L) 8.7(L)  Total Protein 6.5 - 8.1 g/dL - - 8.1  Total Bilirubin 0.3 - 1.2 mg/dL - - 1.3(H)  Alkaline Phos 38 - 126 U/L - - 61  AST 15 - 41 U/L - - 44(H)  ALT 0 - 44 U/L - - 27    Proteus mirabilis  which is a enterobacteriaceae in blood culture. Urine culture no growth- was sent after h received a dose of ceftriaxone  Wound culture pending- spoke to lab . As of now no staph aurus in the wound  Currently he is no ceftriaxone procalcitonin declining As he is  responding will not broaden coverage now. Also noted he is on heparin drip

## 2018-11-09 NOTE — Anesthesia Post-op Follow-up Note (Signed)
Anesthesia QCDR form completed.        

## 2018-11-10 ENCOUNTER — Encounter: Payer: Self-pay | Admitting: Podiatry

## 2018-11-10 DIAGNOSIS — L089 Local infection of the skin and subcutaneous tissue, unspecified: Secondary | ICD-10-CM

## 2018-11-10 DIAGNOSIS — R7881 Bacteremia: Secondary | ICD-10-CM

## 2018-11-10 DIAGNOSIS — A498 Other bacterial infections of unspecified site: Secondary | ICD-10-CM

## 2018-11-10 LAB — CULTURE, BLOOD (ROUTINE X 2): Special Requests: ADEQUATE

## 2018-11-10 LAB — CBC WITH DIFFERENTIAL/PLATELET
Abs Immature Granulocytes: 0.37 10*3/uL — ABNORMAL HIGH (ref 0.00–0.07)
Basophils Absolute: 0.1 10*3/uL (ref 0.0–0.1)
Basophils Relative: 1 %
Eosinophils Absolute: 0.2 10*3/uL (ref 0.0–0.5)
Eosinophils Relative: 2 %
HCT: 38.6 % — ABNORMAL LOW (ref 39.0–52.0)
Hemoglobin: 12.2 g/dL — ABNORMAL LOW (ref 13.0–17.0)
Immature Granulocytes: 4 %
Lymphocytes Relative: 19 %
Lymphs Abs: 1.7 10*3/uL (ref 0.7–4.0)
MCH: 26.5 pg (ref 26.0–34.0)
MCHC: 31.6 g/dL (ref 30.0–36.0)
MCV: 83.7 fL (ref 80.0–100.0)
Monocytes Absolute: 1.3 10*3/uL — ABNORMAL HIGH (ref 0.1–1.0)
Monocytes Relative: 15 %
Neutro Abs: 5.2 10*3/uL (ref 1.7–7.7)
Neutrophils Relative %: 59 %
Platelets: 133 10*3/uL — ABNORMAL LOW (ref 150–400)
RBC: 4.61 MIL/uL (ref 4.22–5.81)
RDW: 14.8 % (ref 11.5–15.5)
WBC: 8.9 10*3/uL (ref 4.0–10.5)
nRBC: 0 % (ref 0.0–0.2)

## 2018-11-10 LAB — BASIC METABOLIC PANEL
Anion gap: 7 (ref 5–15)
BUN: 23 mg/dL (ref 8–23)
CO2: 27 mmol/L (ref 22–32)
Calcium: 8.2 mg/dL — ABNORMAL LOW (ref 8.9–10.3)
Chloride: 102 mmol/L (ref 98–111)
Creatinine, Ser: 1.35 mg/dL — ABNORMAL HIGH (ref 0.61–1.24)
GFR calc Af Amer: >60 mL/min (ref >60)
GFR calc non Af Amer: 55 mL/min — ABNORMAL LOW (ref >60)
Glucose, Bld: 134 mg/dL — ABNORMAL HIGH (ref 70–99)
Potassium: 3.5 mmol/L (ref 3.5–5.1)
Sodium: 136 mmol/L (ref 135–145)

## 2018-11-10 LAB — GLUCOSE, CAPILLARY
Glucose-Capillary: 108 mg/dL — ABNORMAL HIGH (ref 70–99)
Glucose-Capillary: 128 mg/dL — ABNORMAL HIGH (ref 70–99)

## 2018-11-10 LAB — PROCALCITONIN: Procalcitonin: 5.05 ng/mL

## 2018-11-10 MED ORDER — SODIUM CHLORIDE 0.9 % IV SOLN
3.0000 g | Freq: Four times a day (QID) | INTRAVENOUS | Status: DC
  Administered 2018-11-10: 17:00:00 3 g via INTRAVENOUS
  Filled 2018-11-10 (×2): qty 8
  Filled 2018-11-10: qty 3
  Filled 2018-11-10: qty 8

## 2018-11-10 MED ORDER — SODIUM CHLORIDE 0.9% FLUSH: 10.0000 mL | INTRAVENOUS | Status: DC | PRN

## 2018-11-10 MED ORDER — AMPICILLIN-SULBACTAM IV (FOR PTA / DISCHARGE USE ONLY): 3.0000 g | Freq: Four times a day (QID) | INTRAVENOUS | 0 refills | Status: AC

## 2018-11-10 MED ORDER — OXYCODONE-ACETAMINOPHEN 5-325 MG PO TABS: 1.0000 | ORAL_TABLET | Freq: Three times a day (TID) | ORAL | 0 refills | Status: DC | PRN

## 2018-11-10 MED ORDER — TRAZODONE HCL 50 MG PO TABS: 50.0000 mg | ORAL_TABLET | Freq: Every evening | ORAL | 0 refills | Status: DC | PRN

## 2018-11-10 NOTE — Discharge Summary (Addendum)
[facility] [facility] [place] [facility], 63 y.o., DOB [birth year], MRN [id number]. Admission date: 11/07/2018 Discharge Date 11/10/2018 Primary MD Mickey Farber, MD Admitting Physician Jude Enid Baas, MD  Admission Diagnosis  TIA (transient ischemic attack) [G45.9] Lactate blood increase [R79.89] Fever, unspecified fever cause [R50.9]  Discharge Diagnosis   Active Problems: Sepsis due to abscess left forefoot Acute hypoxic respiratory failure due to fluid overload Borderline diabetes Acute kidney injury due to sepsis Hypothyroidism Sleep apnea Very low HDL Stroke ruled out   Hospital Course  HISTORY OF PRESENT ILLNESS:  Jacob Lawson  is a 63 y.o. male with a known history of hypertension, borderline diabetes mellitus, hyperlipidemia, obstructive sleep apnea for which patient uses CPAP at night and hypothyroidism who had gone to urology clinic this morning due to having some dysuria and concern about possible urinary tract infection.  Patient also having fevers.  Was said to have had a temperature of 101 this morning.  While at the urology clinic patient suddenly developed left-sided weakness and some difficulty with speaking.  Concerned about some confusion as well.  Patient was transferred to the emergency room as code stroke.  CT scan of the head done with no acute findings.  Initially there was concern for stroke.  He was seen in consultation by neurology.  Who due to resolution of symptoms did not recommend TPA.  Patient underwent LP which was completely normal.  MRI was recommended however patient stated that he did not want to have MRI.  Patient on day 2 started having significant shortness of breath, chills.  He underwent a work-up with CT per PE which failed to show PE she did show volume overload.  Echocardiogram showed normal EF.  Patient was treated with small dose of Lasix with resolution of his symptoms.  In terms of his sepsis he was seen in consultation by  infectious disease.  Patient had a puncture wound on the left foot.  He was treated with antibiotics.  He was taken to the OR by podiatry for his left foot and had incision and drainage of that left foot.  Patient also had positive blood cultures.  Infectious disease has guided [place] in terms of antibiotic for 10days of IV Unasyn, daily dressing changes per podiatry.            Consults  ID. Cardiology, pulmanry  Significant Tests:  See full reports for all details    Ct Chest Wo Contrast  Result Date: 11/07/2018 CLINICAL DATA:  Fever of unknown origin. EXAM: CT CHEST, ABDOMEN AND PELVIS WITHOUT CONTRAST TECHNIQUE: Multidetector CT imaging of the chest, abdomen and pelvis was performed following the standard protocol without IV contrast. COMPARISON:  11/14/2017 CT abdomen and pelvis. FINDINGS: CT CHEST FINDINGS Cardiovascular: Heart is normal size. Aorta is normal caliber. Scattered aortic and coronary artery calcifications. Mediastinum/Nodes: No mediastinal, hilar, or axillary adenopathy. Trachea and esophagus are unremarkable. Thyroid unremarkable. Lungs/Pleura: Lungs are clear. No focal airspace opacities or suspicious nodules. No effusions. Musculoskeletal: Chest wall soft tissues are unremarkable. No acute bony abnormality. CT ABDOMEN PELVIS FINDINGS Hepatobiliary: Diffuse low-density throughout the liver compatible with fatty infiltration. No focal abnormality. Gallbladder unremarkable. Pancreas: No focal abnormality or ductal dilatation. Spleen: No focal abnormality.  Normal size. Adrenals/Urinary Tract: Exophytic cyst off the lower pole of the left kidney measures up to 4.5 cm, stable. Exophytic cyst anteriorly off the lower pole of the left kidney also stable. No renal or ureteral stones. No hydronephrosis. Adrenal glands and  urinary bladder unremarkable. Stomach/Bowel: Few scattered colonic diverticula. Stomach, large and small bowel grossly unremarkable. Vascular/Lymphatic: Aortic  atherosclerosis. Prominent left inguinal lymph node has a short axis diameter of 14 mm. No other enlarged abdominal or pelvic lymph nodes. Reproductive: No visible focal abnormality. Other: No free fluid or free air. Musculoskeletal: No acute bony abnormality. IMPRESSION: No acute cardiopulmonary disease. Aortic atherosclerosis, coronary artery disease. Diffuse fatty infiltration of the liver. No renal or ureteral stones. No hydronephrosis. Stable left renal cysts. Prominent left inguinal lymph node with a short axis diameter of 1.4 cm. This could be followed clinically to ensure stability. Electronically Signed   By: Charlett Nose M.D.   On: 11/07/2018 10:38   Ct Angio Chest Pe W Or Wo Contrast  Result Date: 11/08/2018 CLINICAL DATA:  Increased shortness of breath EXAM: CT ANGIOGRAPHY CHEST WITH CONTRAST TECHNIQUE: Multidetector CT imaging of the chest was performed using the standard protocol during bolus administration of intravenous contrast. Multiplanar CT image reconstructions and MIPs were obtained to evaluate the vascular anatomy. CONTRAST:  OMNIPAQUE IOHEXOL 350 MG/ML SOLN COMPARISON:  CT chest dated 11/07/2018 FINDINGS: Cardiovascular: Initial contrast bolus demonstrates suboptimal contrast opacification, specifically in the bilateral lower lobes. Subsequent contrast bolus demonstrates improved contrast opacification to the lobar level. However, evaluation of the bilateral pulmonary arteries is constrained by respiratory motion, leading to artifact, involving the lung apices and bilateral lower lobes. Within that constraint, there is no evidence of pulmonary embolism. The heart is normal in size. No pericardial effusion. No evidence thoracic aortic aneurysm. Atherosclerotic calcifications of the aortic arch. Mild coronary atherosclerosis of the LAD. Mediastinum/Nodes: No suspicious mediastinal lymphadenopathy. 8 mm short axis subcarinal node. Visualized left thyroid is unremarkable. Right thyroid  is not visualized, presumably surgically absent. Lungs/Pleura: Evaluation of the lung parenchyma is constrained by respiratory motion. Within that constraint, there no suspicious pulmonary nodules. Scattered areas of ground-glass opacity likely reflect expiratory imaging. This appearance is not considered suspicious for interstitial edema. No focal consolidation. No pleural effusion or pneumothorax. Upper Abdomen: Visualized upper abdomen is grossly unremarkable. Musculoskeletal: Degenerative changes of the visualized thoracolumbar spine. Review of the MIP images confirms the above findings. IMPRESSION: Limited evaluation, as above. No evidence of pulmonary embolism. When accounting for respiratory motion, there is no interval change from recent CT. Aortic Atherosclerosis (ICD10-I70.0). Electronically Signed   By: Charline Bills M.D.   On: 11/08/2018 12:10   Mr Foot Left Wo Contrast  Result Date: 11/08/2018 CLINICAL DATA:  Foot swelling, diabetic, swelling and redness EXAM: MRI OF THE LEFT FOOT WITHOUT CONTRAST TECHNIQUE: Multiplanar, multisequence MR imaging of the left forefoot was performed. No intravenous contrast was administered. COMPARISON:  Radiograph same day FINDINGS: Bones/Joint/Cartilage Normal marrow signal is seen throughout. No areas of periosteal reaction, cortical destruction or a vascular necrosis. No fractures seen. No large joint effusions are noted. Mild first MTP joint osteoarthritis is noted. Ligaments The Lisfranc ligament is intact. The collateral ligaments appear to be intact. Muscles and Tendons There is increased feathery signal seen within the abductor and adductor hallucis muscle belly and flexor digitorum brevis. There is small amount of fluid seen surrounding it extending to the myotendinous junction of the abductor hallucis muscle. There is also mildly increased feathery signal seen within the plantar surface of the intrinsic musculature. The flexor and extensor tendons  appear to be intact. Soft tissues Diffuse dorsal soft tissue edema is noted. No focal fluid collection or sinus tract is seen. There is mild skin thickening seen along  the plantar medial surface of the forefoot. IMPRESSION: 1. Myositis/muscular strain involving the abductor and adductor hallucis muscle bellies as well as the flexor digitorum brevis. 2. There is also mild muscular edema seen within the intrinsic muscles of the forefoot. 3. No evidence of osteomyelitis or soft tissue abscess. Electronically Signed   By: Jonna Clark M.D.   On: 11/08/2018 19:50   Dg Chest Portable 1 View  Result Date: 11/07/2018 CLINICAL DATA:  Fever EXAM: PORTABLE CHEST 1 VIEW COMPARISON:  04/03/2006 FINDINGS: The heart size and mediastinal contours are within normal limits. Both lungs are clear. The visualized skeletal structures are unremarkable. IMPRESSION: No acute abnormality of the lungs in AP portable projection. Electronically Signed   By: Lauralyn Primes M.D.   On: 11/07/2018 10:52   Dg Foot Complete Left  Result Date: 11/08/2018 CLINICAL DATA:  63 year old male with history of ulcer on the left foot. EXAM: LEFT FOOT - COMPLETE 3+ VIEW COMPARISON:  No priors. FINDINGS: There is no evidence of fracture or dislocation. There is no evidence of inflammatory arthropathy or other focal bone abnormality. Soft tissues are unremarkable. IMPRESSION: Negative. Electronically Signed   By: Trudie Reed M.D.   On: 11/08/2018 11:43   Ct Renal Stone Study  Result Date: 11/07/2018 CLINICAL DATA:  Fever of unknown origin. EXAM: CT CHEST, ABDOMEN AND PELVIS WITHOUT CONTRAST TECHNIQUE: Multidetector CT imaging of the chest, abdomen and pelvis was performed following the standard protocol without IV contrast. COMPARISON:  11/14/2017 CT abdomen and pelvis. FINDINGS: CT CHEST FINDINGS Cardiovascular: Heart is normal size. Aorta is normal caliber. Scattered aortic and coronary artery calcifications. Mediastinum/Nodes: No mediastinal,  hilar, or axillary adenopathy. Trachea and esophagus are unremarkable. Thyroid unremarkable. Lungs/Pleura: Lungs are clear. No focal airspace opacities or suspicious nodules. No effusions. Musculoskeletal: Chest wall soft tissues are unremarkable. No acute bony abnormality. CT ABDOMEN PELVIS FINDINGS Hepatobiliary: Diffuse low-density throughout the liver compatible with fatty infiltration. No focal abnormality. Gallbladder unremarkable. Pancreas: No focal abnormality or ductal dilatation. Spleen: No focal abnormality.  Normal size. Adrenals/Urinary Tract: Exophytic cyst off the lower pole of the left kidney measures up to 4.5 cm, stable. Exophytic cyst anteriorly off the lower pole of the left kidney also stable. No renal or ureteral stones. No hydronephrosis. Adrenal glands and urinary bladder unremarkable. Stomach/Bowel: Few scattered colonic diverticula. Stomach, large and small bowel grossly unremarkable. Vascular/Lymphatic: Aortic atherosclerosis. Prominent left inguinal lymph node has a short axis diameter of 14 mm. No other enlarged abdominal or pelvic lymph nodes. Reproductive: No visible focal abnormality. Other: No free fluid or free air. Musculoskeletal: No acute bony abnormality. IMPRESSION: No acute cardiopulmonary disease. Aortic atherosclerosis, coronary artery disease. Diffuse fatty infiltration of the liver. No renal or ureteral stones. No hydronephrosis. Stable left renal cysts. Prominent left inguinal lymph node with a short axis diameter of 1.4 cm. This could be followed clinically to ensure stability. Electronically Signed   By: Charlett Nose M.D.   On: 11/07/2018 10:38   Ct Head Code Stroke Wo Contrast  Result Date: 11/07/2018 CLINICAL DATA:  Code stroke.  Speech difficulty.  Possible stroke. EXAM: CT HEAD WITHOUT CONTRAST TECHNIQUE: Contiguous axial images were obtained from the base of the skull through the vertex without intravenous contrast. COMPARISON:  Brain MRI 11/17/2012 FINDINGS:  Brain: No evidence of acute infarction, hemorrhage, hydrocephalus, extra-axial collection or mass lesion/mass effect. Vascular: No hyperdense vessel or unexpected calcification. Skull: Normal. Negative for fracture or focal lesion. Sinuses/Orbits: No acute finding. Other: These results were called  by telephone at the time of interpretation on 11/07/2018 at 10:32 am to provider [facility] , who verbally acknowledged these results. ASPECTS [facility] Stroke Program Early CT Score) - Ganglionic level infarction (caudate, lentiform nuclei, internal capsule, insula, M1-M3 cortex): 7 - Supraganglionic infarction (M4-M6 cortex): 3 Total score (0-10 with 10 being normal): 10 IMPRESSION: Negative head CT. ASPECTS is 10. Electronically Signed   By: Marnee Spring M.D.   On: 11/07/2018 10:35   Dg Lumbar Puncture Fluoro Guide  Result Date: 11/07/2018 CLINICAL DATA:  Meningitis.  Fever and stroke symptoms. EXAM: DIAGNOSTIC LUMBAR PUNCTURE UNDER FLUOROSCOPIC GUIDANCE FLUOROSCOPY TIME:  Fluoroscopy Time:  1 minutes 42 seconds Radiation Exposure Index (if provided by the fluoroscopic device): 72.3 mGy Number of Acquired Spot Images: 1 PROCEDURE: After discussing the risks and benefits of this procedure with the patient informed consent was obtained. The back was sterilely prepped and draped. Following local anesthesia with 1% lidocaine a 22 gauge spinal needle was advanced into the lumbar spine thecal sac at the L4-L5 level under fluoroscopic guidance . 6 cc of CSF obtained. Initial CSF was slightly blood-tinged, this cleared. CSF was sent to the lab for further evaluation. Hemostasis achieved following needle removal. There no complications. IMPRESSION: Successful fluoroscopically directed lumbar puncture. CSF samples sent to the laboratory for further evaluation. Electronically Signed   By: Maisie Fus  Register   On: 11/07/2018 13:39       Today   Subjective:   Jacob Lawson patient feeling well denies any  complaints  Objective:   Blood pressure 112/68, pulse 70, temperature 98.3 F (36.8 C), resp. rate 18, height 6' (1.829 m), weight 120.7 kg, SpO2 91 %.  .  Intake/Output Summary (Last 24 hours) at 11/10/2018 1535 Last data filed at 11/10/2018 1300 Gross per 24 hour  Intake 1150 ml  Output 750 ml  Net 400 ml    Exam VITAL SIGNS: Blood pressure 112/68, pulse 70, temperature 98.3 F (36.8 C), resp. rate 18, height 6' (1.829 m), weight 120.7 kg, SpO2 91 %.  GENERAL:  63 y.o.-year-old patient lying in the bed with no acute distress.  EYES: Pupils equal, round, reactive to light and accommodation. No scleral icterus. Extraocular muscles intact.  HEENT: Head atraumatic, normocephalic. Oropharynx and nasopharynx clear.  NECK:  Supple, no jugular venous distention. No thyroid enlargement, no tenderness.  LUNGS: Normal breath sounds bilaterally, no wheezing, rales,rhonchi or crepitation. No use of accessory muscles of respiration.  CARDIOVASCULAR: S1, S2 normal. No murmurs, rubs, or gallops.  ABDOMEN: Soft, nontender, nondistended. Bowel sounds present. No organomegaly or mass.  EXTREMITIES: Left foot dressing in place  NEUROLOGIC: Cranial nerves II through XII are intact. Muscle strength 5/5 in all extremities. Sensation intact. Gait not checked.  PSYCHIATRIC: The patient is alert and oriented x 3.  SKIN: No obvious rash, lesion, or ulcer.   Data Review     CBC w Diff:  Lab Results  Component Value Date   WBC 8.9 11/10/2018   HGB 12.2 (L) 11/10/2018   HGB 14.4 02/24/2014   HCT 38.6 (L) 11/10/2018   HCT 44.4 02/24/2014   PLT 133 (L) 11/10/2018   PLT 185 02/24/2014   LYMPHOPCT 19 11/10/2018   MONOPCT 15 11/10/2018   EOSPCT 2 11/10/2018   BASOPCT 1 11/10/2018   CMP:  Lab Results  Component Value Date   NA 136 11/10/2018   NA 138 02/24/2014   K 3.5 11/10/2018   K 3.8 02/24/2014   CL 102 11/10/2018   CL  105 02/24/2014   CO2 27 11/10/2018   CO2 29 02/24/2014   BUN 23  11/10/2018   BUN 13 02/24/2014   CREATININE 1.35 (H) 11/10/2018   CREATININE 1.22 02/24/2014   PROT 8.1 11/07/2018   PROT 7.8 02/24/2014   ALBUMIN 4.1 11/07/2018   ALBUMIN 3.6 02/24/2014   BILITOT 1.3 (H) 11/07/2018   BILITOT 0.3 02/24/2014   ALKPHOS 61 11/07/2018   ALKPHOS 79 02/24/2014   AST 44 (H) 11/07/2018   AST 24 02/24/2014   ALT 27 11/07/2018   ALT 26 02/24/2014  .  Micro Results Recent Results (from the past 240 hour(s))  Blood culture (routine x 2)     Status: None (Preliminary result)   Collection Time: 11/07/18 10:05 AM   Specimen: BLOOD  Result Value Ref Range Status   Specimen Description BLOOD BLOOD RIGHT FOREARM  Final   Special Requests   Final    BOTTLES DRAWN AEROBIC AND ANAEROBIC Blood Culture adequate volume   Culture   Final    NO GROWTH 3 DAYS Performed at [facility], [address] [place], [place] [address]    Report Status PENDING  Incomplete  Blood culture (routine x 2)     Status: Abnormal   Collection Time: 11/07/18 10:05 AM   Specimen: BLOOD  Result Value Ref Range Status   Specimen Description   Final    BLOOD BLOOD LEFT FOREARM Performed at [facility], [address], [place], [place] [address]    Special Requests   Final    BOTTLES DRAWN AEROBIC AND ANAEROBIC Blood Culture adequate volume Performed at [facility], [address] [place], [place] [address]    Culture  Setup Time   Final    GRAM NEGATIVE RODS IN BOTH AEROBIC AND ANAEROBIC BOTTLES CRITICAL RESULT CALLED TO, READ BACK BY AND VERIFIED WITH: Rhee HALL @506  11/08/2018 TTG    Culture PROTEUS MIRABILIS (A)  Final   Report Status 11/10/2018 FINAL  Final   Organism ID, Bacteria PROTEUS MIRABILIS  Final      Susceptibility   Proteus mirabilis - MIC*    AMPICILLIN <=2 SENSITIVE Sensitive     CEFAZOLIN <=4 SENSITIVE Sensitive     CEFEPIME <=1 SENSITIVE Sensitive     CEFTAZIDIME <=1 SENSITIVE Sensitive     CEFTRIAXONE <=1 SENSITIVE  Sensitive     CIPROFLOXACIN <=0.25 SENSITIVE Sensitive     GENTAMICIN 2 SENSITIVE Sensitive     IMIPENEM 2 SENSITIVE Sensitive     TRIMETH/SULFA <=20 SENSITIVE Sensitive     AMPICILLIN/SULBACTAM <=2 SENSITIVE Sensitive     PIP/TAZO <=4 SENSITIVE Sensitive     * PROTEUS MIRABILIS  Blood Culture ID Panel (Reflexed)     Status: Abnormal   Collection Time: 11/07/18 10:05 AM  Result Value Ref Range Status   Enterococcus species NOT DETECTED NOT DETECTED Final   Listeria monocytogenes NOT DETECTED NOT DETECTED Final   Staphylococcus species NOT DETECTED NOT DETECTED Final   Staphylococcus aureus (BCID) NOT DETECTED NOT DETECTED Final   Streptococcus species NOT DETECTED NOT DETECTED Final   Streptococcus agalactiae NOT DETECTED NOT DETECTED Final   Streptococcus pneumoniae NOT DETECTED NOT DETECTED Final   Streptococcus pyogenes NOT DETECTED NOT DETECTED Final   Acinetobacter baumannii NOT DETECTED NOT DETECTED Final   Enterobacteriaceae species DETECTED (A) NOT DETECTED Final    Comment: Enterobacteriaceae represent a large family of gram-negative bacteria, not a single organism. CRITICAL RESULT CALLED TO, READ BACK BY AND VERIFIED WITH:  Aston HALL @506  11/08/2018 TTG    Enterobacter cloacae complex NOT DETECTED NOT DETECTED Final   Escherichia coli NOT DETECTED NOT DETECTED Final   Klebsiella oxytoca NOT DETECTED NOT DETECTED Final   Klebsiella pneumoniae NOT DETECTED NOT DETECTED Final   Proteus species DETECTED (A) NOT DETECTED Final    Comment: CRITICAL RESULT CALLED TO, READ BACK BY AND VERIFIED WITH: Sammarco HALL @506  11/08/2018 TTG    Serratia marcescens NOT DETECTED NOT DETECTED Final   Carbapenem resistance NOT DETECTED NOT DETECTED Final   Haemophilus influenzae NOT DETECTED NOT DETECTED Final   Neisseria meningitidis NOT DETECTED NOT DETECTED Final   Pseudomonas aeruginosa NOT DETECTED NOT DETECTED Final   Candida albicans NOT DETECTED NOT DETECTED Final   Candida glabrata  NOT DETECTED NOT DETECTED Final   Candida krusei NOT DETECTED NOT DETECTED Final   Candida parapsilosis NOT DETECTED NOT DETECTED Final   Candida tropicalis NOT DETECTED NOT DETECTED Final    Comment: Performed at [facility], [place], [place] [address]  SARS Coronavirus 2 by RT PCR (hospital order, performed in [place] hospital lab) Nasopharyngeal Nasopharyngeal Swab     Status: None   Collection Time: 11/07/18 10:40 AM   Specimen: Nasopharyngeal Swab  Result Value Ref Range Status   SARS Coronavirus 2 NEGATIVE NEGATIVE Final    Comment: (NOTE) If result is NEGATIVE SARS-CoV-2 target nucleic acids are NOT DETECTED. The SARS-CoV-2 RNA is generally detectable in upper and lower  respiratory specimens during the acute phase of infection. The lowest  concentration of SARS-CoV-2 viral copies this assay can detect is 250  copies / mL. A negative result does not preclude SARS-CoV-2 infection  and should not be used as the sole basis for treatment or other  patient management decisions.  A negative result may occur with  improper specimen collection / handling, submission of specimen other  than nasopharyngeal swab, presence of viral mutation(s) within the  areas targeted by this assay, and inadequate number of viral copies  (<250 copies / mL). A negative result must be combined with clinical  observations, patient history, and epidemiological information. If result is POSITIVE SARS-CoV-2 target nucleic acids are DETECTED. The SARS-CoV-2 RNA is generally detectable in upper and lower  respiratory specimens dur ing the acute phase of infection.  Positive  results are indicative of active infection with SARS-CoV-2.  Clinical  correlation with patient history and other diagnostic information is  necessary to determine patient infection status.  Positive results do  not rule out bacterial infection or co-infection with other viruses. If result is PRESUMPTIVE  POSTIVE SARS-CoV-2 nucleic acids MAY BE PRESENT.   A presumptive positive result was obtained on the submitted specimen  and confirmed on repeat testing.  While 2019 novel coronavirus  (SARS-CoV-2) nucleic acids may be present in the submitted sample  additional confirmatory testing may be necessary for epidemiological  and / or clinical management purposes  to differentiate between  SARS-CoV-2 and other Sarbecovirus currently known to infect humans.  If clinically indicated additional testing with an alternate test  methodology [phone] is advised. The SARS-CoV-2 RNA is generally  detectable in upper and lower respiratory sp ecimens during the acute  phase of infection. The expected result is Negative. Fact Sheet for Patients:  StrictlyIdeas.no Fact Sheet for Healthcare Providers: BankingDealers.co.za This test is not yet approved or cleared by the [place] FDA and has been authorized for detection and/or diagnosis of SARS-CoV-2 by FDA under an Emergency Use  Authorization (EUA).  This EUA will remain in effect (meaning this test can be used) for the duration of the COVID-19 declaration under Section 564(b)(1) of the Act, 21 U.S.C. section 360bbb-3(b)(1), unless the authorization is terminated or revoked sooner. Performed at [facility], [address] [place], [place] [address]   CSF culture     Status: None (Preliminary result)   Collection Time: 11/07/18 12:50 PM   Specimen: CSF; Cerebrospinal Fluid  Result Value Ref Range Status   Specimen Description   Final    CSF Performed at [facility], [address] [place], [place] [address]    Special Requests   Final    NONE Performed at [facility], [address] [place], [place] [address]    Gram Stain   Final    WBC SEEN RED BLOOD CELLS NO ORGANISMS SEEN Performed at [facility], [address] [place], [place] [address]     Culture   Final    NO GROWTH 2 DAYS Performed at [facility], [address] [place], [place] [address]    Report Status PENDING  Incomplete  Culture, fungus without smear     Status: None (Preliminary result)   Collection Time: 11/07/18  1:10 PM   Specimen: PATH Cytology CSF; Cerebrospinal Fluid  Result Value Ref Range Status   Specimen Description   Final    CSF Performed at [facility], [address] [place], [place] [address]    Special Requests   Final    NONE Performed at [facility], [address] [place], [place] [address]    Culture   Final    NO FUNGUS ISOLATED AFTER 2 DAYS Performed at [facility], [address] [place], [place] [address]    Report Status PENDING  Incomplete  Urine Culture     Status: None   Collection Time: 11/07/18  3:41 PM   Specimen: Urine, Random  Result Value Ref Range Status   Specimen Description   Final    URINE, RANDOM Performed at [facility], [address] [place], [place] [address]    Special Requests   Final    NONE Performed at [facility], [address] [place], [place] [address]    Culture   Final    NO GROWTH Performed at [facility], [address] [place], [place] [address]    Report Status 11/09/2018 FINAL  Final  Aerobic/Anaerobic Culture (surgical/deep wound)     Status: None (Preliminary result)   Collection Time: 11/08/18  2:25 PM   Specimen: Wound; Abscess  Result Value Ref Range Status   Specimen Description   Final    WOUND LEFT FOOT Performed at [facility], [address] [place], [place] [address]    Special Requests   Final    NONE Performed at [facility], [address] [place], [place] [address]    Gram Stain   Final    MODERATE WBC PRESENT, PREDOMINANTLY PMN MODERATE GRAM POSITIVE COCCI MODERATE GRAM NEGATIVE RODS Performed at [facility], [address] [place], [place] [address]    Culture    Final    ABUNDANT ENTEROCOCCUS FAECALIS FEW GRAM NEGATIVE RODS NO ANAEROBES ISOLATED; CULTURE IN PROGRESS FOR 5 DAYS    Report Status PENDING  Incomplete   Organism ID, Bacteria ENTEROCOCCUS FAECALIS  Final      Susceptibility   Enterococcus faecalis -  MIC*    AMPICILLIN <=2 SENSITIVE Sensitive     VANCOMYCIN 2 SENSITIVE Sensitive     GENTAMICIN SYNERGY SENSITIVE Sensitive     * ABUNDANT ENTEROCOCCUS FAECALIS        Code Status Orders  (From admission, onward)         Start     Ordered   11/07/18 1436  Full code  Continuous     11/07/18 1436        Code Status History    This patient has a current code status but no historical code status.   Advance Care Planning Activity          Follow-up Information    Mickey Farber, MD. Go on 11/18/2018.   Specialty: Internal Medicine Why: @ 1pm Contact information: [address] [facility] [place] [address] [phone]        Linus Galas, DPM. Go on 11/17/2018.   Specialty: Podiatry Why: @ 9:30am Contact information: [address] [place] [address] [phone]           Discharge Medications   Allergies as of 11/10/2018      Reactions   Lamictal [lamotrigine] Hives   Statins    Muscle/joint aches      Medication List    TAKE these medications   ampicillin-sulbactam  IVPB Commonly known as: UNASYN Inject 3 g into the vein every 6 (six) hours for 10 days. Indication: P. Mirabilis bacteremia and deep foot infection/abscess Last Day of Therapy: 11/20/2018 Labs - Once weekly on THURSDAY:  CBC/D and CMP   aspirin 81 MG tablet Take 81 mg by mouth daily.   CENTRUM SILVER PO Take 1 tablet by mouth daily.   co-enzyme Q-10 50 MG capsule Take 50 mg by mouth daily.   esomeprazole 20 MG capsule Commonly known as: NEXIUM Take 20 mg by mouth daily before breakfast.   finasteride 5 MG tablet Commonly known as: PROSCAR Take 5 mg by mouth every evening.   fluticasone  50 MCG/ACT nasal spray Commonly known as: FLONASE Place 1-2 sprays into both nostrils daily as needed for allergies.   Krill Oil 1000 MG Caps Take 3,000 mg by mouth every evening.   levothyroxine 125 MCG tablet Commonly known as: SYNTHROID Take 125 mcg by mouth daily before breakfast.   liothyronine 25 MCG tablet Commonly known as: CYTOMEL Take 12.5 mcg by mouth daily.   lisinopril 40 MG tablet Commonly known as: ZESTRIL Take 40 mg by mouth daily.   loratadine 10 MG tablet Commonly known as: CLARITIN Take 10 mg by mouth daily as needed for allergies.   metFORMIN 500 MG 24 hr tablet Commonly known as: GLUCOPHAGE-XR Take 1,000 mg by mouth daily with breakfast.   metoprolol succinate 50 MG 24 hr tablet Commonly known as: TOPROL-XL Take 50 mg by mouth daily. Take with or immediately following a meal.   oxyCODONE-acetaminophen 5-325 MG tablet Commonly known as: PERCOCET/ROXICET Take 1 tablet by mouth every 8 (eight) hours as needed for severe pain.   sertraline 50 MG tablet Commonly known as: ZOLOFT Take 50 mg by mouth every evening.   TESTOSTERONE TD Place 1.25 g onto the skin daily. (20% topical cream)   traZODone 50 MG tablet Commonly known as: DESYREL Take 1 tablet (50 mg total) by mouth at bedtime as needed for sleep.   vitamin B-12 500 MCG tablet Commonly known as: CYANOCOBALAMIN Take 500 mcg by mouth daily.   Vitamin D3 50 MCG (2000 UT)  Tabs Take 2,000 Units by mouth daily.            Home Infusion Instuctions  (From admission, onward)         Start     Ordered   11/10/18 0000  Home infusion instructions [facility] May follow [facility] Dosing Protocol; May administer Cathflo as needed to maintain patency of vascular access device.; Flushing of vascular access device: per [facility] Protocol: 0.9% NaCl pre/post medica...    Question Answer Comment  Instructions May follow [facility] Dosing Protocol   Instructions May administer Cathflo as  needed to maintain patency of vascular access device.   Instructions Flushing of vascular access device: per [facility] Protocol: 0.9% NaCl pre/post medication administration and prn patency; Heparin 100 u/ml, 5ml for implanted ports and Heparin 10u/ml, 5ml for all other central venous catheters.   Instructions May follow [facility] Anaphylaxis Protocol for First Dose Administration in the home: 0.9% NaCl at 25-50 ml/hr to maintain IV access for protocol meds. Epinephrine 0.3 ml IV/IM PRN and Benadryl 25-50 IV/IM PRN s/s of anaphylaxis.   Instructions [facility] Infusion Coordinator (RN) to assist per patient IV care needs in the home PRN.      11/10/18 1306             Total Time in preparing paper work, data evaluation and todays exam - 35 minutes  Auburn Bilberry M.D on 11/10/2018 at 3:35 PM [facility]   Office  [phone]

## 2018-11-10 NOTE — Progress Notes (Signed)
IVs removed before patient discharged. Presented education to patient and patients wife on wound care and IV medication Unasyn.

## 2018-11-10 NOTE — Progress Notes (Signed)
Date of Admission:  11/07/2018     Subjective: Feeling better No fever or chills Pain left foot   Medications:  . aspirin  325 mg Oral Daily  . cholecalciferol  2,000 Units Oral Daily  . finasteride  5 mg Oral QPM  . insulin aspart  0-5 Units Subcutaneous QHS  . insulin aspart  0-9 Units Subcutaneous TID WC  . levothyroxine  125 mcg Oral QAC breakfast  . liothyronine  12.5 mcg Oral Daily  . LORazepam  1 mg Intravenous Once  . multivitamin with minerals  1 tablet Oral Daily  . pantoprazole  40 mg Oral Daily  . sertraline  50 mg Oral QPM  . sodium chloride flush  3 mL Intravenous Once  . vitamin B-12  500 mcg Oral Daily    Objective: Vital signs in last 24 hours: Temp:  [97.7 F (36.5 C)-99.5 F (37.5 C)] 98.3 F (36.8 C) (10/19 0740) Pulse Rate:  [70-80] 70 (10/19 0740) Resp:  [16-23] 18 (10/19 0740) BP: (109-137)/(68-86) 112/68 (10/19 0740) SpO2:  [90 %-100 %] 91 % (10/19 0740)  PHYSICAL EXAM:  General: Alert, cooperative, no distress, appears stated age.  Head: Normocephalic, without obvious abnormality, atraumatic. Eyes: Conjunctivae clear, anicteric sclerae. Pupils are equal Lungs: Clear to auscultation bilaterally. No Wheezing or Rhonchi. No rales. Heart: Regular rate and rhythm, no murmur, rub or gallop. Abdomen: Soft, non-tender,not distended. Bowel sounds normal. No masses Extremities: left foot- some swelling- erythema over the dorsum Plantar aspect surgical site is well coapted  with gauze Drain in place Lymph: Cervical, supraclavicular normal. Neurologic: Grossly non-focal  Lab Results Recent Labs    11/08/18 0427 11/09/18 0447  WBC 10.1 8.2  HGB 12.1* 11.6*  HCT 37.1* 35.2*  NA 136 136  K 3.8 3.2*  CL 105 101  CO2 24 25  BUN 23 20  CREATININE 1.41* 1.21   Liver Panel No results for input(s): PROT, ALBUMIN, AST, ALT, ALKPHOS, BILITOT, BILIDIR, IBILI in the last 72 hours. Sedimentation Rate No results for input(s): ESRSEDRATE in the last  72 hours. C-Reactive Protein No results for input(s): CRP in the last 72 hours.  Microbiology:  Studies/Results: Ct Angio Chest Pe W Or Wo Contrast  Result Date: 11/08/2018 CLINICAL DATA:  Increased shortness of breath EXAM: CT ANGIOGRAPHY CHEST WITH CONTRAST TECHNIQUE: Multidetector CT imaging of the chest was performed using the standard protocol during bolus administration of intravenous contrast. Multiplanar CT image reconstructions and MIPs were obtained to evaluate the vascular anatomy. CONTRAST:  OMNIPAQUE IOHEXOL 350 MG/ML SOLN COMPARISON:  CT chest dated 11/07/2018 FINDINGS: Cardiovascular: Initial contrast bolus demonstrates suboptimal contrast opacification, specifically in the bilateral lower lobes. Subsequent contrast bolus demonstrates improved contrast opacification to the lobar level. However, evaluation of the bilateral pulmonary arteries is constrained by respiratory motion, leading to artifact, involving the lung apices and bilateral lower lobes. Within that constraint, there is no evidence of pulmonary embolism. The heart is normal in size. No pericardial effusion. No evidence thoracic aortic aneurysm. Atherosclerotic calcifications of the aortic arch. Mild coronary atherosclerosis of the LAD. Mediastinum/Nodes: No suspicious mediastinal lymphadenopathy. 8 mm short axis subcarinal node. Visualized left thyroid is unremarkable. Right thyroid is not visualized, presumably surgically absent. Lungs/Pleura: Evaluation of the lung parenchyma is constrained by respiratory motion. Within that constraint, there no suspicious pulmonary nodules. Scattered areas of ground-glass opacity likely reflect expiratory imaging. This appearance is not considered suspicious for interstitial edema. No focal consolidation. No pleural effusion or pneumothorax. Upper Abdomen: Visualized  upper abdomen is grossly unremarkable. Musculoskeletal: Degenerative changes of the visualized thoracolumbar spine.  Review of the MIP images confirms the above findings. IMPRESSION: Limited evaluation, as above. No evidence of pulmonary embolism. When accounting for respiratory motion, there is no interval change from recent CT. Aortic Atherosclerosis (ICD10-I70.0). Electronically Signed   By: Julian Hy M.D.   On: 11/08/2018 12:10   Mr Foot Left Wo Contrast  Result Date: 11/08/2018 CLINICAL DATA:  Foot swelling, diabetic, swelling and redness EXAM: MRI OF THE LEFT FOOT WITHOUT CONTRAST TECHNIQUE: Multiplanar, multisequence MR imaging of the left forefoot was performed. No intravenous contrast was administered. COMPARISON:  Radiograph same day FINDINGS: Bones/Joint/Cartilage Normal marrow signal is seen throughout. No areas of periosteal reaction, cortical destruction or a vascular necrosis. No fractures seen. No large joint effusions are noted. Mild first MTP joint osteoarthritis is noted. Ligaments The Lisfranc ligament is intact. The collateral ligaments appear to be intact. Muscles and Tendons There is increased feathery signal seen within the abductor and adductor hallucis muscle belly and flexor digitorum brevis. There is small amount of fluid seen surrounding it extending to the myotendinous junction of the abductor hallucis muscle. There is also mildly increased feathery signal seen within the plantar surface of the intrinsic musculature. The flexor and extensor tendons appear to be intact. Soft tissues Diffuse dorsal soft tissue edema is noted. No focal fluid collection or sinus tract is seen. There is mild skin thickening seen along the plantar medial surface of the forefoot. IMPRESSION: 1. Myositis/muscular strain involving the abductor and adductor hallucis muscle bellies as well as the flexor digitorum brevis. 2. There is also mild muscular edema seen within the intrinsic muscles of the forefoot. 3. No evidence of osteomyelitis or soft tissue abscess. Electronically Signed   By: Prudencio Pair M.D.   On:  11/08/2018 19:50     Assessment/Plan: Proteus bacteremia due to left foot infection  Abscess left foot s/p I/D enterococcus and proteus in the wound Currently on zosyn-change to ampicillin/sulbactam to cover for anerobes as well,  Will need for 10 days    DM= not well controlled - management as per primary team  Discussed the management with patient and his wife and Dr.Cline

## 2018-11-10 NOTE — Progress Notes (Signed)
PT Cancellation Note  Patient Details Name: Jacob Lawson MRN: [id number] DOB: 03-Nov-1955   Cancelled Treatment:    Reason Eval/Treat Not Completed: Other (comment). Chart reviewed, pt underwent I and D L foot 11/09/2018 under general anesthesia requiring new PT order; not received at time of chart review. Post review, pt now with a current discharge order; therefore, no follow up on PT order required.    Larae Grooms, PTA 11/10/2018, 1:13 PM

## 2018-11-10 NOTE — Progress Notes (Signed)
OT Screen Note  Patient Details Name: Jacob Lawson MRN: [id number] DOB: September 24, 1955   Cancelled Treatment:    Reason Eval/Treat Not Completed: Patient declined, no reason specified. Consult received, chart reviewed. Pt pleasant and A&Ox4. PT politely declines need for OT services. Reports he has been able to perform ADL tasks without assist/difficulty and denies additional difficulty since I&D of L foot ulcer. Pt verbalizes plan for home to take showers without getting L foot wet. Wife able to assist as well. Will sign off. Please re-consult if additional needs arise.   Jeni Salles, MPH, MS, OTR/L ascom [phone] 11/10/18, 9:16 AM

## 2018-11-10 NOTE — Progress Notes (Signed)
1 Day Post-Op   Subjective/Chief Complaint: Patient seen.  No complaints of pain.   Objective: Vital signs in last 24 hours: Temp:  [97.7 F (36.5 C)-99.5 F (37.5 C)] 98.3 F (36.8 C) (10/19 0740) Pulse Rate:  [70-80] 70 (10/19 0740) Resp:  [16-23] 18 (10/19 0740) BP: (109-137)/(68-86) 112/68 (10/19 0740) SpO2:  [90 %-100 %] 91 % (10/19 0740) Last BM Date: 11/09/18  Intake/Output from previous day: 10/18 0701 - 10/19 0700 In: 670 [P.O.:120; I.V.:500; IV Piggyback:50] Out: 1425 [Urine:1425] Intake/Output this shift: Total I/O In: 480 [P.O.:480] Out: -   The bandage is dry and intact on the left foot.  Mild bleeding on the bandaging.  Upon removal of the dorsal and plantar incisions are well coapted with the exception of the open packed area plantarly.  Still some erythema and edema but mildly improved.  No purulence noted expressed from the wound.  Lab Results:  Recent Labs    11/08/18 0427 11/09/18 0447  WBC 10.1 8.2  HGB 12.1* 11.6*  HCT 37.1* 35.2*  PLT 96* 99*   BMET Recent Labs    11/08/18 0427 11/09/18 0447  NA 136 136  K 3.8 3.2*  CL 105 101  CO2 24 25  GLUCOSE 193* 157*  BUN 23 20  CREATININE 1.41* 1.21  CALCIUM 8.3* 8.3*   PT/INR No results for input(s): LABPROT, INR in the last 72 hours. ABG Recent Labs    11/08/18 1045  PHART 7.46*  HCO3 21.3    Studies/Results: Mr Foot Left Wo Contrast  Result Date: 11/08/2018 CLINICAL DATA:  Foot swelling, diabetic, swelling and redness EXAM: MRI OF THE LEFT FOOT WITHOUT CONTRAST TECHNIQUE: Multiplanar, multisequence MR imaging of the left forefoot was performed. No intravenous contrast was administered. COMPARISON:  Radiograph same day FINDINGS: Bones/Joint/Cartilage Normal marrow signal is seen throughout. No areas of periosteal reaction, cortical destruction or a vascular necrosis. No fractures seen. No large joint effusions are noted. Mild first MTP joint osteoarthritis is noted. Ligaments The  Lisfranc ligament is intact. The collateral ligaments appear to be intact. Muscles and Tendons There is increased feathery signal seen within the abductor and adductor hallucis muscle belly and flexor digitorum brevis. There is small amount of fluid seen surrounding it extending to the myotendinous junction of the abductor hallucis muscle. There is also mildly increased feathery signal seen within the plantar surface of the intrinsic musculature. The flexor and extensor tendons appear to be intact. Soft tissues Diffuse dorsal soft tissue edema is noted. No focal fluid collection or sinus tract is seen. There is mild skin thickening seen along the plantar medial surface of the forefoot. IMPRESSION: 1. Myositis/muscular strain involving the abductor and adductor hallucis muscle bellies as well as the flexor digitorum brevis. 2. There is also mild muscular edema seen within the intrinsic muscles of the forefoot. 3. No evidence of osteomyelitis or soft tissue abscess. Electronically Signed   By: Jonna Clark M.D.   On: 11/08/2018 19:50    Anti-infectives: Anti-infectives (From admission, onward)   Start     Dose/Rate Route Frequency Ordered Stop   11/09/18 1800  piperacillin-tazobactam (ZOSYN) IVPB 3.375 g     3.375 g 12.5 mL/hr over 240 Minutes Intravenous Every 8 hours 11/09/18 1801     11/09/18 1200  piperacillin-tazobactam (ZOSYN) IVPB 3.375 g  Status:  Discontinued     3.375 g 12.5 mL/hr over 240 Minutes Intravenous Every 8 hours 11/09/18 1114 11/09/18 1801   11/08/18 1445  cefTRIAXone (ROCEPHIN) 2 g  in sodium chloride 0.9 % 100 mL IVPB  Status:  Discontinued     2 g 200 mL/hr over 30 Minutes Intravenous Every 24 hours 11/07/18 1739 11/09/18 1114   11/08/18 1200  vancomycin (VANCOCIN) 1,500 mg in sodium chloride 0.9 % 500 mL IVPB  Status:  Discontinued     1,500 mg 250 mL/hr over 120 Minutes Intravenous Every 24 hours 11/08/18 0817 11/08/18 1028   11/08/18 1000  vancomycin (VANCOCIN) 1,250 mg in  sodium chloride 0.9 % 250 mL IVPB  Status:  Discontinued     1,250 mg 166.7 mL/hr over 90 Minutes Intravenous Every 24 hours 11/07/18 1503 11/08/18 0817   11/07/18 1445  cefTRIAXone (ROCEPHIN) 2 g in sodium chloride 0.9 % 100 mL IVPB  Status:  Discontinued     2 g 200 mL/hr over 30 Minutes Intravenous Every 12 hours 11/07/18 1439 11/07/18 1739   11/07/18 1215  vancomycin (VANCOCIN) 1,250 mg in sodium chloride 0.9 % 250 mL IVPB     1,250 mg 166.7 mL/hr over 90 Minutes Intravenous  Once 11/07/18 1202 11/07/18 1606   11/07/18 1115  cefTRIAXone (ROCEPHIN) 2 g in sodium chloride 0.9 % 100 mL IVPB     2 g 200 mL/hr over 30 Minutes Intravenous  Once 11/07/18 1102 11/07/18 1205   11/07/18 1115  vancomycin (VANCOCIN) IVPB 1000 mg/200 mL premix     1,000 mg 200 mL/hr over 60 Minutes Intravenous  Once 11/07/18 1102 11/07/18 1314      Assessment/Plan: s/p Procedure(s): IRRIGATION AND DEBRIDEMENT FOOT (Left) Assessment: Stable status post I&D left foot.   Plan: The wound was repacked with iodoform gauze followed by a bulky sterile bandage.  Patient will need home health care for dressing changes.  Discussed daily or every other day dressing changes.  Patient should be stable for discharge with antibiotics per infectious disease.  Follow-up with the patient in 1 week.  LOS: 3 days    Durward Fortes 11/10/2018

## 2018-11-10 NOTE — TOC Transition Note (Signed)
Transition of Care [facility]) - CM/SW Discharge Note   Patient Details  Name: Jacob Lawson MRN: [id number] Date of Birth: 05/21/1955  [facility] CM/SW Contact:  Jacob Chroman, LCSW Phone Number: 11/10/2018, 4:37 PM   Clinical Narrative:  Unable to find a home health agency for nursing. Jacob Lawson with [facility] has set up a nurse through [facility] for IV abx. RN will provide wound care teaching for wife before they leave today and Jacob Lawson will reinforce when they come to the home. First visit will be tomorrow morning. Patient's daughter-in-law is a CNA and may be able to provide some assistance as well. No further concerns. CSW signing off.  Final next level of care: Home w Home Health Services Barriers to Discharge: Barriers Resolved   Patient Goals and CMS Choice   CMS Medicare.gov Compare Post Acute Care list provided to:: Patient Represenative (must comment)(Wife)    Discharge Placement                Patient to be transferred to facility by: Wife will transport Name of family member notified: Jacob Lawson Patient and family notified of of transfer: 11/10/18  Discharge Plan and Services     Post Acute Care Choice: Home Health                    HH Arranged: RN [facility] Agency: Other - See comment([facility]) Date [place]: 11/10/18   Representative spoke with at [place]: Jacob Lawson contacted  Social Determinants of Health (SDOH) Interventions     Readmission Risk Interventions No flowsheet data found.

## 2018-11-10 NOTE — Treatment Plan (Signed)
Diagnosis: Proteus bacteremia with left foot deep abscess Baseline Creatinine 1.21 Culture Result: Proteus, enterococcus  Allergies  Allergen Reactions  . Lamictal [Lamotrigine] Hives  . Statins     Muscle/joint aches    OPAT Orders Discharge antibiotics: Unasyn 3 grams IV every 6 hours  End Date: 11/20/18  PIC/midline Care Per Protocol:  Labs weekly on Thursday while on IV antibiotics: _X_ CBC with differential  _X_ CMP     _X_ Please pull PIC at completion of IV antibiotics   Fax weekly labs to Dr.Noelani Harbach [phone]  Clinic Follow Up Appt:1 week   Call [phone] to make appt

## 2018-11-10 NOTE — TOC Initial Note (Addendum)
Transition of Care [facility]) - Initial/Assessment Note    Patient Details  Name: Jacob Lawson MRN: [id number] Date of Birth: 1956-01-11  Transition of Care [facility]) CM/SW Contact:    Jacob Chroman, LCSW Phone Number: 11/10/2018, 1:35 PM  Clinical Narrative: CSW met with patient. Wife at bedside. CSW introduced role and explained that discharge planning would be discussed. Patient and his wife agreeable to home health RN for home IV abx. Jacob Lawson with [facility] is aware of plan. [facility] representative is reviewing referral and will notify CSW of decision.              2:19 pm: [facility], [facility], [facility], [facility], [facility], and [facility] have declined. [facility] is reviewing. Still waiting on response from [facility].      3:41 pm: [facility] unable to accept, [facility] not in network. [facility], [facility] declined. [facility] is reviewing.   Expected Discharge Plan: [place] Barriers to Discharge: Barriers Resolved   Patient Goals and CMS Choice   CMS Medicare.gov Compare Post Acute Care list provided to:: Patient Represenative (must comment)(Wife)    Expected Discharge Plan and Services Expected Discharge Plan: [place] Choice: [place] arrangements for the past 2 months: Single Family Home Expected Discharge Date: 11/10/18                         HH Arranged: RN          Prior Living Arrangements/Services Living arrangements for the past 2 months: Single Family Home Lives with:: Spouse Patient language and need for interpreter reviewed:: Yes Do you feel safe going back to the place where you live?: Yes      Need for Family Participation in Patient Care: Yes (Comment) Care giver support system in place?: Yes (comment)   Criminal Activity/Legal Involvement Pertinent to Current Situation/Hospitalization:  No - Comment as needed  Activities of Daily Living Home Assistive Devices/Equipment: None ADL Screening (condition at time of admission) Patient's cognitive ability adequate to safely complete daily activities?: Yes Is the patient deaf or have difficulty hearing?: Yes Does the patient have difficulty seeing, even when wearing glasses/contacts?: No Does the patient have difficulty concentrating, remembering, or making decisions?: No Patient able to express need for assistance with ADLs?: Yes Does the patient have difficulty dressing or bathing?: Yes Independently performs ADLs?: Yes (appropriate for developmental age) Does the patient have difficulty walking or climbing stairs?: No Weakness of Legs: None Weakness of Arms/Hands: None  Permission Sought/Granted Permission sought to share information with : Facility Sport and exercise psychologist, Family Supports    Share Information with NAME: Jacob Lawson  Permission granted to share info w AGENCY: [place] granted to share info w Relationship: Wife  Permission granted to share info w Contact Information: [phone]  Emotional Assessment Appearance:: Appears stated age Attitude/Demeanor/Rapport: Engaged, Gracious Affect (typically observed): Accepting, Appropriate, Calm, Pleasant Orientation: : Oriented to Self, Oriented to Place, Oriented to  Time, Oriented to Situation Alcohol / Substance Use: Never Used Psych Involvement: No (comment)  Admission diagnosis:  TIA (transient ischemic attack) [G45.9] Lactate blood increase [R79.89] Fever, unspecified fever cause [R50.9] Patient Active Problem List   Diagnosis Date Noted  . Suspected infectious meningitis 11/07/2018   PCP:  Ezequiel Kayser, MD Pharmacy:   [id number] [place], [place] -  [address] [place] [address] Phone: [phone] Fax: [phone]     Social Determinants of Health (SDOH) Interventions    Readmission Risk  Interventions No flowsheet data found.

## 2018-11-10 NOTE — Progress Notes (Signed)
PHARMACY CONSULT NOTE FOR:  OUTPATIENT  PARENTERAL ANTIBIOTIC THERAPY (OPAT)  Indication: P. Mirabilis bacteremia and deep foot infection/abscess Regimen: ampicillin/sulbactam 3gm VI q6h End date: 11/20/2018  IV antibiotic discharge orders are pended. To discharging provider:  please sign these orders via discharge navigator,  Select New Orders & click on the button choice - Manage This Unsigned Work.     Thank you for allowing pharmacy to be a part of this patient's care.  Doreene Eland, PharmD, BCPS.   Work Cell: [phone] 11/10/2018 1:03 PM

## 2018-11-11 LAB — OLIGOCLONAL BANDS, CSF + SERM

## 2018-11-12 LAB — CSF CULTURE W GRAM STAIN: Culture: NO GROWTH

## 2018-11-12 LAB — CULTURE, BLOOD (ROUTINE X 2)
Culture: NO GROWTH
Special Requests: ADEQUATE

## 2018-11-13 ENCOUNTER — Inpatient Hospital Stay: Payer: BC Managed Care – PPO | Admitting: Infectious Diseases

## 2018-11-13 ENCOUNTER — Telehealth: Payer: Self-pay | Admitting: Infectious Diseases

## 2018-11-13 LAB — AEROBIC/ANAEROBIC CULTURE W GRAM STAIN (SURGICAL/DEEP WOUND)

## 2018-11-13 NOTE — Telephone Encounter (Signed)
Requesting a cream for itching. Having itching back right side straight down back, also when flushing out line it was itching him too.   Thanks

## 2018-11-14 ENCOUNTER — Ambulatory Visit: Payer: BC Managed Care – PPO | Attending: Infectious Diseases | Admitting: Infectious Diseases

## 2018-11-14 ENCOUNTER — Other Ambulatory Visit: Payer: Self-pay

## 2018-11-14 DIAGNOSIS — R21 Rash and other nonspecific skin eruption: Secondary | ICD-10-CM

## 2018-11-14 DIAGNOSIS — R7881 Bacteremia: Secondary | ICD-10-CM | POA: Diagnosis not present

## 2018-11-14 DIAGNOSIS — L089 Local infection of the skin and subcutaneous tissue, unspecified: Secondary | ICD-10-CM | POA: Diagnosis not present

## 2018-11-14 DIAGNOSIS — B952 Enterococcus as the cause of diseases classified elsewhere: Secondary | ICD-10-CM | POA: Diagnosis not present

## 2018-11-14 DIAGNOSIS — B964 Proteus (mirabilis) (morganii) as the cause of diseases classified elsewhere: Secondary | ICD-10-CM

## 2018-11-14 DIAGNOSIS — Z95828 Presence of other vascular implants and grafts: Secondary | ICD-10-CM

## 2018-11-14 HISTORY — DX: Bacteremia: R78.81

## 2018-11-14 NOTE — Progress Notes (Signed)
The purpose of this virtual visit is to provide medical care while limiting exposure to the novel coronavirus (COVID19) for both patient and office staff.   Consent was obtained for TELEMED visit:  Yes.   Answered questions that patient had about telehealth interaction:  Yes.   I discussed the limitations, risks, security and privacy concerns of performing an evaluation and management service by telephone. I also discussed with the patient that there may be a patient responsible charge related to this service. The patient expressed understanding and agreed to proceed.   Patient Location: Home Provider Location: OFFICE  PT was recently discharged from hospital after being treated for Proteus bacteremia and a left foot infection with proteus and enterococcus. He was sent home on IV unasyn 3 grams q 6 until 11/20/18. Pt wanted to discuss about a rash he has on his back, which is itchy. It is not affecting any other part of his body. He thinks it is due to sweating and has started using calamine lotion He does not have any fever, diarrhea, pain in his foot. His wife Coralyn Mark who was on the video call is doing his IV antibiotics and also doing his dressing.  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 Due to sweating Does not currently look like a drug rash. Asked him to continue calamine- use benadryl 25 mg Po if needed not exceeding three times a day  Discussed the lab results from yesetrday- HB 10.4 was 12 on discharge- the sample was taken from the line and there is a possibility of dilutional effect- Cr 1.14 and plt 201. He has an appt with me next Thursday and I will check his labs then including a blood culture  Continue Unasyn for the left foot infection/bacteremia until  11/20/18

## 2018-11-14 NOTE — Telephone Encounter (Signed)
Did a virtual visit today

## 2018-11-18 DIAGNOSIS — L508 Other urticaria: Secondary | ICD-10-CM | POA: Insufficient documentation

## 2018-11-18 DIAGNOSIS — J309 Allergic rhinitis, unspecified: Secondary | ICD-10-CM | POA: Insufficient documentation

## 2018-11-18 DIAGNOSIS — F329 Major depressive disorder, single episode, unspecified: Secondary | ICD-10-CM | POA: Insufficient documentation

## 2018-11-18 DIAGNOSIS — F32A Depression, unspecified: Secondary | ICD-10-CM | POA: Insufficient documentation

## 2018-11-20 ENCOUNTER — Other Ambulatory Visit: Payer: Self-pay

## 2018-11-20 ENCOUNTER — Other Ambulatory Visit: Payer: Self-pay | Admitting: Infectious Diseases

## 2018-11-20 ENCOUNTER — Other Ambulatory Visit
Admission: RE | Admit: 2018-11-20 | Discharge: 2018-11-20 | Disposition: A | Discharge status: Home / Self Care | Payer: BC Managed Care – PPO | Attending: Infectious Diseases | Admitting: Infectious Diseases

## 2018-11-20 ENCOUNTER — Encounter: Payer: Self-pay | Admitting: Infectious Diseases

## 2018-11-20 ENCOUNTER — Ambulatory Visit: Payer: BC Managed Care – PPO | Attending: Infectious Diseases | Admitting: Infectious Diseases

## 2018-11-20 VITALS — BP 122/72 | HR 57 | Resp 16 | Ht 72.0 in | Wt 252.0 lb

## 2018-11-20 DIAGNOSIS — R7881 Bacteremia: Secondary | ICD-10-CM | POA: Diagnosis present

## 2018-11-20 DIAGNOSIS — B952 Enterococcus as the cause of diseases classified elsewhere: Secondary | ICD-10-CM

## 2018-11-20 DIAGNOSIS — L089 Local infection of the skin and subcutaneous tissue, unspecified: Secondary | ICD-10-CM | POA: Diagnosis not present

## 2018-11-20 DIAGNOSIS — I10 Essential (primary) hypertension: Secondary | ICD-10-CM

## 2018-11-20 DIAGNOSIS — Z87448 Personal history of other diseases of urinary system: Secondary | ICD-10-CM

## 2018-11-20 DIAGNOSIS — G4730 Sleep apnea, unspecified: Secondary | ICD-10-CM

## 2018-11-20 DIAGNOSIS — B964 Proteus (mirabilis) (morganii) as the cause of diseases classified elsewhere: Secondary | ICD-10-CM

## 2018-11-20 DIAGNOSIS — Z95828 Presence of other vascular implants and grafts: Secondary | ICD-10-CM

## 2018-11-20 DIAGNOSIS — E039 Hypothyroidism, unspecified: Secondary | ICD-10-CM

## 2018-11-20 DIAGNOSIS — Z888 Allergy status to other drugs, medicaments and biological substances status: Secondary | ICD-10-CM

## 2018-11-20 DIAGNOSIS — E785 Hyperlipidemia, unspecified: Secondary | ICD-10-CM

## 2018-11-20 DIAGNOSIS — Z862 Personal history of diseases of the blood and blood-forming organs and certain disorders involving the immune mechanism: Secondary | ICD-10-CM

## 2018-11-20 LAB — C-REACTIVE PROTEIN: CRP: 1.5 mg/dL — ABNORMAL HIGH (ref <1.0)

## 2018-11-20 LAB — SEDIMENTATION RATE: Sed Rate: 16 mm/hr (ref 0–20)

## 2018-11-20 MED ORDER — AMOXICILLIN-POT CLAVULANATE 875-125 MG PO TABS: 1.0000 | ORAL_TABLET | Freq: Two times a day (BID) | ORAL | 0 refills | Status: DC

## 2018-11-20 NOTE — Progress Notes (Signed)
NAME: Jacob ModestHenry E Gutterman  DOB: 12/08/1955  MRN: [id number]  Date/Time: 11/20/2018 11:55 AM   Subjective:  ? Jacob Lawson is a 63 y.o.male  with a history of  hypertension, hypothyroidism, sleep apnea, hyperlipidemia was recently in the hospital for fever and chills and also for possible left-sided weakness and aphasia which was transient.  Initially there was a concern for meningitis and he had a lumbar puncture which was essentially normal.  He was diagnosed to have Proteus bacteremia and found to have a foot infection which he had sustained 2 months ago by possibly stepping on something sharp.Marland Kitchen.  He had taken an needle heated and opened up the spot on the foot.  It had closed but was brewing inside . he had MRI of the foot which revealed soft tissue edema on the left foot but there was no focal collection or sinus.  There was some myositis involving the abductor and adductor hallucis muscle bellies.  He was seen by podiatrist Dr. Alberteen Spindleline And underwent incision and drainage of the abscess of the left forefoot.  This was done on 11/09/2018.  The cultures had Proteus and Enterococcus.  He was discharged home on Unasyn IV.  He has been doing well. His wife is packing the wound. The wife is also doing IV antibiotics 4 times a day. Today is his last day. He had a sweaty rash on his back at the beginning of his treatment but that has resolved completely with calamine and a few doses of Benadryl.  It was thought to be a heat rash. Patient is doing well now.  There is no fever there is no diarrhea.  He has minimal pain in the left foot.  He saw Dr. Alberteen Spindlecline as outpatient. Past Medical History:  Diagnosis Date  . Carpal tunnel syndrome on both sides   . Depression   . Diabetes mellitus without complication (HCC)   . GERD (gastroesophageal reflux disease)   . Hyperlipidemia   . Hypertension   . Hypothyroidism   . Sleep apnea    CPAP  . Wears hearing aid in both ears     Past Surgical History:  Procedure  Laterality Date  . APPENDECTOMY    . CARPAL TUNNEL RELEASE Right 11/2003  . CARPAL TUNNEL RELEASE Left 11/19/2017   Procedure: LEFT OPEN CARPAL TUNNEL RELEASE;  Surgeon: Erin SonsKernodle, Harold, MD;  [facility];  Service: Orthopedics;  Laterality: Left;  Diabetic - oral meds Sleep Apnea  . COLONOSCOPY     2/91, 6/00, 01/04, 7/08, 10/10, 12/13, 02/26/17  . ESOPHAGOGASTRODUODENOSCOPY     2/91, 1/95, 6/00, 1/04, 7/08, 10/10  . EXCISION NEUROMA Left 06/2007   foot  . FINGER SURGERY     for congenital webbing  . IRRIGATION AND DEBRIDEMENT FOOT Left 11/09/2018   Procedure: IRRIGATION AND DEBRIDEMENT FOOT;  Surgeon: Linus Galasline, Todd, DPM;  Location: [facility];  Service: Podiatry;  Laterality: Left;  Marland Kitchen. MANDIBLE SURGERY     in late 30s, fo overbite  . PROSTATE BIOPSY  12/2015  . ROTATOR CUFF REPAIR Left 02/2005  . THYROIDECTOMY, PARTIAL    . TONSILLECTOMY     and adenoidectomy    Social History   Socioeconomic History  . Marital status: Married    Spouse name: Not on file  . Number of children: Not on file  . Years of education: Not on file  . Highest education level: Not on file  Occupational History  . Not on file  Social Needs  .  Financial resource strain: Not on file  . Food insecurity    Worry: Not on file    Inability: Not on file  . Transportation needs    Medical: Not on file    Non-medical: Not on file  Tobacco Use  . Smoking status: Never Smoker  . Smokeless tobacco: Never Used  Substance and Sexual Activity  . Alcohol use: Yes    Comment: occasional beer  . Drug use: No  . Sexual activity: Not on file  Lifestyle  . Physical activity    Days per week: Not on file    Minutes per session: Not on file  . Stress: Not on file  Relationships  . Social Herbalist on phone: Not on file    Gets together: Not on file    Attends religious service: Not on file    Active member of club or organization: Not on file    Attends meetings of clubs or  organizations: Not on file    Relationship status: Not on file  . Intimate partner violence    Fear of current or ex partner: Not on file    Emotionally abused: Not on file    Physically abused: Not on file    Forced sexual activity: Not on file  Other Topics Concern  . Not on file  Social History Narrative  . Not on file    History reviewed. No pertinent family history. Allergies  Allergen Reactions  . Pravastatin Other (See Comments)  . Simvastatin Other (See Comments)  . Lamictal [Lamotrigine] Hives  . Statins Other (See Comments)    Muscle/joint aches Muscle/joint aches   ? Current Outpatient Medications  Medication Sig Dispense Refill  . ampicillin-sulbactam (UNASYN) IVPB Inject 3 g into the vein every 6 (six) hours for 10 days. Indication: P. Mirabilis bacteremia and deep foot infection/abscess Last Day of Therapy: 11/20/2018 Labs - Once weekly on THURSDAY:  CBC/D and CMP 40 Units 0  . aspirin 81 MG tablet Take 81 mg by mouth daily.    . Cholecalciferol (VITAMIN D3) 2000 units TABS Take 2,000 Units by mouth daily.     Marland Kitchen co-enzyme Q-10 50 MG capsule Take 50 mg by mouth daily.    Marland Kitchen esomeprazole (NEXIUM) 20 MG capsule Take 20 mg by mouth daily before breakfast.     . finasteride (PROSCAR) 5 MG tablet Take 5 mg by mouth every evening.     . fluticasone (FLONASE) 50 MCG/ACT nasal spray Place 1-2 sprays into both nostrils daily as needed for allergies.     Javier Docker Oil 1000 MG CAPS Take 3,000 mg by mouth every evening.     Marland Kitchen levothyroxine (SYNTHROID, LEVOTHROID) 125 MCG tablet Take 125 mcg by mouth daily before breakfast.    . liothyronine (CYTOMEL) 25 MCG tablet Take 12.5 mcg by mouth daily.     Marland Kitchen lisinopril (ZESTRIL) 40 MG tablet Take 40 mg by mouth daily.     Marland Kitchen loratadine (CLARITIN) 10 MG tablet Take 10 mg by mouth daily as needed for allergies.    . metFORMIN (GLUCOPHAGE-XR) 500 MG 24 hr tablet Take 1,000 mg by mouth daily with breakfast.    . metoprolol succinate  (TOPROL-XL) 50 MG 24 hr tablet Take 50 mg by mouth daily. Take with or immediately following a meal.    . Multiple Vitamins-Minerals (CENTRUM SILVER PO) Take 1 tablet by mouth daily.     . sertraline (ZOLOFT) 50 MG tablet Take 50 mg by mouth  every evening.     . TESTOSTERONE TD Place 1.25 g onto the skin daily. (20% topical cream)    . vitamin B-12 (CYANOCOBALAMIN) 500 MCG tablet Take 500 mcg by mouth daily.     No current facility-administered medications for this visit.      Abtx:  Anti-infectives (From admission, onward)   None      REVIEW OF SYSTEMS:  Const: negative fever, negative chills, negative weight loss Eyes: negative diplopia or visual changes, negative eye pain ENT: negative coryza, negative sore throat Resp: negative cough, hemoptysis, dyspnea Cards: negative for chest pain, palpitations, lower extremity edema GU: negative for frequency, dysuria and hematuria GI: Negative for abdominal pain, diarrhea, bleeding, constipation Skin: negative for rash and pruritus Heme: negative for easy bruising and gum/nose bleeding MS: negative for myalgias, arthralgias, back pain and muscle weakness Neurolo:negative for headaches, dizziness, vertigo, memory problems  Psych: negative for feelings of anxiety, depression  Endocrine: negative for thyroid, diabetes Allergy/Immunology-as noted above ? Objective:  VITALS:  BP 122/72   Pulse (!) 57   Resp 16   Ht 6' (1.829 m)   Wt 252 lb (114.3 kg)   SpO2 97%   BMI 34.18 kg/m  PHYSICAL EXAM:  General: Alert, cooperative, no distress, appears stated age.  Head: Normocephalic, without obvious abnormality, atraumatic. Eyes: Conjunctivae clear, anicteric sclerae. Pupils are equal ENT Nares normal. No drainage or sinus tenderness. Lips, mucosa, and tongue normal. No Thrush Neck: Supple, symmetrical, no adenopathy, thyroid: non tender no carotid bruit and no JVD. Back: No CVA tenderness. Lungs: Clear to auscultation bilaterally. No  Wheezing or Rhonchi. No rales. Heart: Regular rate and rhythm, no murmur, rub or gallop. Abdomen: Soft, non-tender,not distended. Bowel sounds normal. No masses Extremities: Left foot dressing removed. Swelling and erythema much improved The plantar aspect there is a small surgical opening which is packed.     Sutures present Left PIC site clean with no discharge or erythema. skin: No rashes or lesions. Or bruising Lymph: Cervical, supraclavicular normal. Neurologic: Grossly non-focal Pertinent Labs Lab Results CBC    Component Value Date/Time   WBC 8.9 11/10/2018 1320   RBC 4.61 11/10/2018 1320   HGB 12.2 (L) 11/10/2018 1320   HGB 14.4 02/24/2014 1742   HCT 38.6 (L) 11/10/2018 1320   HCT 44.4 02/24/2014 1742   PLT 133 (L) 11/10/2018 1320   PLT 185 02/24/2014 1742   MCV 83.7 11/10/2018 1320   MCV 83 02/24/2014 1742   MCH 26.5 11/10/2018 1320   MCHC 31.6 11/10/2018 1320   RDW 14.8 11/10/2018 1320   RDW 14.5 02/24/2014 1742   LYMPHSABS 1.7 11/10/2018 1320   MONOABS 1.3 (H) 11/10/2018 1320   EOSABS 0.2 11/10/2018 1320   BASOSABS 0.1 11/10/2018 1320    CMP Latest Ref Rng & Units 11/10/2018 11/09/2018 11/08/2018  Glucose 70 - 99 mg/dL 454(U) 981(X) 914(N)  BUN 8 - 23 mg/dL Creatinine 0.61 - 1.24 mg/dL 8.29(F) 6.21 3.08(M)  Sodium 135 - 145 mmol/L 136 136 136  Potassium 3.5 - 5.1 mmol/L 3.5 3.2(L) 3.8  Chloride 98 - 111 mmol/L 102 101 105  CO2 22 - 32 mmol/L Calcium 8.9 - 10.3 mg/dL 8.2(L) 8.3(L) 8.3(L)  Total Protein 6.5 - 8.1 g/dL - - -  Total Bilirubin 0.3 - 1.2 mg/dL - - -  Alkaline Phos 38 - 126 U/L - - -  AST 15 - 41 U/L - - -  ALT 0 - 44  U/L - - -    ? Impression/Recommendation ? Proteus bacteremia resolved  Left foot soft tissue infection with Proteus and Enterococcus.  Has received IV Unasyn for 2 weeks now.  Today is his last day.  PICC line will be removed.  He will start on a p.o. Augmentin 875 mg every 12 for another 2 weeks.   AKI resolved.  While in the hospital his blood sugar was high around 206.  Last hemoglobin A1c 6.5 from August 2020.  Thrombocytopenia while in the hospital is resolved.  His last platelet count is 344 from 11/18/2018. ___________________________________________________ Discussed with patient and his wife in great detail. We will follow him in 2 to 3 weeks time.  Note:  This document was prepared using Dragon voice recognition software and may include unintentional dictation errors.

## 2018-11-20 NOTE — Patient Instructions (Signed)
You are here for follow up of the foot infection- today will do labs, remove PICC_ START ORAL AUGMENTIN *&% MG BID FOR TWO WEEKS AND FOLLOW UP WITH ME

## 2018-11-25 LAB — CULTURE, BLOOD (SINGLE)
Culture: NO GROWTH
Special Requests: ADEQUATE

## 2018-11-28 LAB — CULTURE, FUNGUS WITHOUT SMEAR

## 2018-12-11 ENCOUNTER — Other Ambulatory Visit: Payer: Self-pay

## 2018-12-11 ENCOUNTER — Ambulatory Visit: Payer: BC Managed Care – PPO | Attending: Infectious Diseases | Admitting: Infectious Diseases

## 2018-12-11 ENCOUNTER — Encounter: Payer: Self-pay | Admitting: Infectious Diseases

## 2018-12-11 VITALS — BP 134/78 | HR 60 | Temp 98.4°F | Resp 16 | Ht 72.0 in | Wt 256.0 lb

## 2018-12-11 DIAGNOSIS — I10 Essential (primary) hypertension: Secondary | ICD-10-CM

## 2018-12-11 DIAGNOSIS — Z79899 Other long term (current) drug therapy: Secondary | ICD-10-CM

## 2018-12-11 DIAGNOSIS — G4730 Sleep apnea, unspecified: Secondary | ICD-10-CM

## 2018-12-11 DIAGNOSIS — B952 Enterococcus as the cause of diseases classified elsewhere: Secondary | ICD-10-CM

## 2018-12-11 DIAGNOSIS — B964 Proteus (mirabilis) (morganii) as the cause of diseases classified elsewhere: Secondary | ICD-10-CM | POA: Diagnosis not present

## 2018-12-11 DIAGNOSIS — Z7984 Long term (current) use of oral hypoglycemic drugs: Secondary | ICD-10-CM

## 2018-12-11 DIAGNOSIS — E039 Hypothyroidism, unspecified: Secondary | ICD-10-CM

## 2018-12-11 DIAGNOSIS — Z8619 Personal history of other infectious and parasitic diseases: Secondary | ICD-10-CM

## 2018-12-11 DIAGNOSIS — L089 Local infection of the skin and subcutaneous tissue, unspecified: Secondary | ICD-10-CM | POA: Diagnosis not present

## 2018-12-11 DIAGNOSIS — Z888 Allergy status to other drugs, medicaments and biological substances status: Secondary | ICD-10-CM

## 2018-12-11 DIAGNOSIS — E785 Hyperlipidemia, unspecified: Secondary | ICD-10-CM

## 2018-12-11 DIAGNOSIS — E11628 Type 2 diabetes mellitus with other skin complications: Secondary | ICD-10-CM

## 2018-12-11 DIAGNOSIS — Z95828 Presence of other vascular implants and grafts: Secondary | ICD-10-CM

## 2018-12-11 NOTE — Progress Notes (Signed)
8NAMECHRISTOPHER Lawson  DOB: 1955-07-12  MRN: [id number]  Date/Time: 12/11/2018 9:52 AM   Subjective:  ?Follow-up visit for foot infection.  Jacob Lawson is a 63 year old male with history of hypertension, hypothyroidism, sleep apnea and hyperlipidemia is here for follow-up of Jacob Lawson left foot infection.  He was recently hospitalized in October 2020 and underwent I?dof Jacob left forefoot.  Jacob culture had Proteus and Enterococcus.  He also had Proteus bacteremia.  He was discharged home on Unasyn and he completed 2 weeks of it.  During Jacob Lawson last visit On 11/20/2018 I had put Jacob Lawson on 2 weeks of oral Augmentin which he finished last Friday. He is doing very well. Jacob foot wound has healed  almost completely. There is no pain in Jacob foot.  He has gone back to Jacob Lawson work. No fever or rash or diarrhea. . Past Medical History:  Diagnosis Date  . Carpal tunnel syndrome on both sides   . Depression   . Diabetes mellitus without complication (HCC)   . GERD (gastroesophageal reflux disease)   . Hyperlipidemia   . Hypertension   . Hypothyroidism   . Sleep apnea    CPAP  . Wears hearing aid in both ears     Past Surgical History:  Procedure Laterality Date  . APPENDECTOMY    . CARPAL TUNNEL RELEASE Right 11/2003  . CARPAL TUNNEL RELEASE Left 11/19/2017   Procedure: LEFT OPEN CARPAL TUNNEL RELEASE;  Surgeon: Erin Sons, MD;  Location: [facility];  Service: Orthopedics;  Laterality: Left;  Diabetic - oral meds Sleep Apnea  . COLONOSCOPY     2/91, 6/00, 01/04, 7/08, 10/10, 12/13, 02/26/17  . ESOPHAGOGASTRODUODENOSCOPY     2/91, 1/95, 6/00, 1/04, 7/08, 10/10  . EXCISION NEUROMA Left 06/2007   foot  . FINGER SURGERY     for congenital webbing  . IRRIGATION AND DEBRIDEMENT FOOT Left 11/09/2018   Procedure: IRRIGATION AND DEBRIDEMENT FOOT;  Surgeon: Linus Galas, DPM;  Location: [facility];  Service: Podiatry;  Laterality: Left;  Marland Kitchen MANDIBLE SURGERY     in late 30s, fo overbite  . PROSTATE  BIOPSY  12/2015  . ROTATOR CUFF REPAIR Left 02/2005  . THYROIDECTOMY, PARTIAL    . TONSILLECTOMY     and adenoidectomy    Social History   Socioeconomic History  . Marital status: Married    Spouse name: Not on file  . Number of children: Not on file  . Years of education: Not on file  . Highest education level: Not on file  Occupational History  . Not on file  Social Needs  . Financial resource strain: Not on file  . Food insecurity    Worry: Not on file    Inability: Not on file  . Transportation needs    Medical: Not on file    Non-medical: Not on file  Tobacco Use  . Smoking status: Never Smoker  . Smokeless tobacco: Never Used  Substance and Sexual Activity  . Alcohol use: Yes    Comment: occasional beer  . Drug use: No  . Sexual activity: Not on file  Lifestyle  . Physical activity    Days per week: Not on file    Minutes per session: Not on file  . Stress: Not on file  Relationships  . Social Musician on phone: Not on file    Gets together: Not on file    Attends religious service: Not on file    Active member  of club or organization: Not on file    Attends meetings of clubs or organizations: Not on file    Relationship status: Not on file  . Intimate partner violence    Fear of current or ex partner: Not on file    Emotionally abused: Not on file    Physically abused: Not on file    Forced sexual activity: Not on file  Other Topics Concern  . Not on file  Social History Narrative  . Not on file    History reviewed. No pertinent family history. Allergies  Allergen Reactions  . Pravastatin Other (See Comments)  . Simvastatin Other (See Comments)  . Lamictal [Lamotrigine] Hives  . Statins Other (See Comments)    Muscle/joint aches Muscle/joint aches   ? Current Outpatient Medications  Medication Sig Dispense Refill  . aspirin 81 MG tablet Take 81 mg by mouth daily.    . Cholecalciferol (VITAMIN D3) 2000 units TABS Take 2,000 Units  by mouth daily.     Marland Kitchen. co-enzyme Q-10 50 MG capsule Take 50 mg by mouth daily.    Marland Kitchen. esomeprazole (NEXIUM) 20 MG capsule Take 20 mg by mouth daily before breakfast.     . finasteride (PROSCAR) 5 MG tablet Take 5 mg by mouth every evening.     . fluticasone (FLONASE) 50 MCG/ACT nasal spray Place 1-2 sprays into both nostrils daily as needed for allergies.     Boris Lown. Krill Oil 1000 MG CAPS Take 3,000 mg by mouth every evening.     Marland Kitchen. levothyroxine (SYNTHROID, LEVOTHROID) 125 MCG tablet Take 125 mcg by mouth daily before breakfast.    . liothyronine (CYTOMEL) 25 MCG tablet Take 12.5 mcg by mouth daily.     Marland Kitchen. lisinopril (ZESTRIL) 40 MG tablet Take 40 mg by mouth daily.     Marland Kitchen. loratadine (CLARITIN) 10 MG tablet Take 10 mg by mouth daily as needed for allergies.    . metFORMIN (GLUCOPHAGE-XR) 500 MG 24 hr tablet Take 1,000 mg by mouth daily with breakfast.    . metoprolol succinate (TOPROL-XL) 50 MG 24 hr tablet Take 50 mg by mouth daily. Take with or immediately following a meal.    . Multiple Vitamins-Minerals (CENTRUM SILVER PO) Take 1 tablet by mouth daily.     . sertraline (ZOLOFT) 50 MG tablet Take 50 mg by mouth every evening.     . TESTOSTERONE TD Place 1.25 g onto Jacob skin daily. (20% topical cream)    . vitamin B-12 (CYANOCOBALAMIN) 500 MCG tablet Take 500 mcg by mouth daily.     No current facility-administered medications for this visit.      Abtx:  Anti-infectives (From admission, onward)   None      REVIEW OF SYSTEMS:  Const: negative fever, negative chills, negative weight loss Eyes: negative diplopia or visual changes, negative eye pain ENT: negative coryza, negative sore throat Resp: negative cough, hemoptysis, dyspnea Cards: negative for chest pain, palpitations, lower extremity edema GU: negative for frequency, dysuria and hematuria GI: Negative for abdominal pain, diarrhea, bleeding, constipation Skin: negative for rash and pruritus Heme: negative for easy bruising and  gum/nose bleeding MS: negative for myalgias, arthralgias, back pain and muscle weakness Neurolo:negative for headaches, dizziness, vertigo, memory problems  Psych: negative for feelings of anxiety, depression  Endocrine: On Metformin. Allergy/Immunology-as noted above ? Objective:  VITALS:  BP 134/78 (BP Location: Left Arm, Lawson Position: Sitting, Cuff Size: Large)   Pulse 60   Temp 98.4 F (36.9 C) (Oral)  Resp 16   Ht 6' (1.829 m)   Wt 256 lb (116.1 kg)   SpO2 96%   BMI 34.72 kg/m  PHYSICAL EXAM:  General: Alert, cooperative, no distress, appears stated age.  Head: Normocephalic, without obvious abnormality, atraumatic. Eyes: Conjunctivae clear, anicteric sclerae. Pupils are equal ENT Nares normal. No drainage or sinus tenderness. Lips, mucosa, and tongue normal. No Thrush Neck: Supple, symmetrical, no adenopathy, thyroid: non tender no carotid bruit and no JVD. Back: No CVA tenderness. Lungs: Clear to auscultation bilaterally. No Wheezing or Rhonchi. No rales. Heart: Regular rate and rhythm, no murmur, rub or gallop. Abdomen: Soft, non-tender,not distended. Bowel sounds normal. No masses Extremities: Left foot there is no erythema no swelling.  Some duskiness.  On Jacob plantar aspect Jacob wound at Jacob metatarsal head area is almost closed.  See picture below     11/20/18      Sutures present Left PIC site clean with no discharge or erythema. skin: No rashes or lesions. Or bruising Lymph: Cervical, supraclavicular normal. Neurologic: Grossly non-focal Pertinent Labs Lab Results No recent labs   ? Impression/Recommendation ? Proteus bacteremia resolved  Left foot soft tissue infection with Proteus and Enterococcus.  received IV Unasyn for 2 weeks and p.o. Augmentin for 2 weeks.  Jacob foot wound is healed.  He does not need any further antibiotics.  Hypertension on lisinopril  Diabetes mellitus on Metformin  Hypothyroidism on Synthroid.  ___________________________________________________ Discussed with Lawson and Jacob Lawson wife in great detail. I do not need to see Jacob Lawson anymore.  He will follow-up with Jacob Lawson podiatrist.  Will follow as needed   note:  This document was prepared using Dragon voice recognition software and may include unintentional dictation errors.

## 2018-12-11 NOTE — Patient Instructions (Signed)
You are here for follow up of left foot infection- It has healed with just a superficial break now. You have completed your antibiotics and all you need is local foot care.

## 2019-04-22 ENCOUNTER — Other Ambulatory Visit: Payer: Self-pay | Admitting: Surgery

## 2019-04-22 DIAGNOSIS — M7581 Other shoulder lesions, right shoulder: Secondary | ICD-10-CM

## 2019-04-22 DIAGNOSIS — M75121 Complete rotator cuff tear or rupture of right shoulder, not specified as traumatic: Secondary | ICD-10-CM

## 2019-04-28 ENCOUNTER — Ambulatory Visit
Admission: RE | Admit: 2019-04-28 | Discharge: 2019-04-28 | Disposition: A | Discharge status: Home / Self Care | Payer: BC Managed Care – PPO | Source: Ambulatory Visit | Attending: Surgery | Admitting: Surgery

## 2019-04-28 ENCOUNTER — Other Ambulatory Visit: Payer: Self-pay

## 2019-04-28 DIAGNOSIS — M7581 Other shoulder lesions, right shoulder: Secondary | ICD-10-CM

## 2019-04-28 DIAGNOSIS — M75121 Complete rotator cuff tear or rupture of right shoulder, not specified as traumatic: Secondary | ICD-10-CM | POA: Insufficient documentation

## 2019-06-08 ENCOUNTER — Other Ambulatory Visit: Payer: BC Managed Care – PPO

## 2019-06-12 ENCOUNTER — Other Ambulatory Visit: Payer: BC Managed Care – PPO

## 2019-06-30 ENCOUNTER — Other Ambulatory Visit: Payer: Self-pay | Admitting: Surgery

## 2019-07-03 ENCOUNTER — Encounter
Admission: RE | Admit: 2019-07-03 | Discharge: 2019-07-03 | Disposition: A | Discharge status: Home / Self Care | Payer: BC Managed Care – PPO | Source: Ambulatory Visit | Attending: Surgery | Admitting: Surgery

## 2019-07-03 ENCOUNTER — Other Ambulatory Visit: Payer: Self-pay

## 2019-07-03 NOTE — Patient Instructions (Signed)
COVID TESTING Date: 07/14/2019 Testing site:  [facility] [address] Thru Hours:  3:81 am - 1:00 pm Once you are tested, you are asked to stay quarantined (avoiding public places) until after your surgery.   Your procedure is scheduled on: Thursday 07-16-2019 Report to Day Surgery on the 2nd floor of the [facility]. To find out your arrival time, please call [phone] between 1PM - 3PM on: 07-15-2019 [facility]  REMEMBER: Instructions that are not followed completely may result in serious medical risk, up to and including death; or upon the discretion of your surgeon and anesthesiologist your surgery may need to be rescheduled.  Do not eat food after midnight the night before surgery.  No gum chewing, lozengers or hard candies.  You may however, drink CLEAR liquids up to 2 hours before you are scheduled to arrive for your surgery. Do not drink anything within 2 hours of your scheduled arrival time.  Clear liquids include: - water  Do NOT drink anything that is not on this list.  Type 1 and Type 2 diabetics should only drink water.  GATORADE DRINK:  Complete drinking 2 hours prior to scheduled arrival time.  TAKE THESE MEDICATIONS THE MORNING OF SURGERY WITH A SIP OF WATER: METOPROLOL SYNTHROID NEXIUM (take one the night before and one on the morning of surgery - helps to prevent nausea after surgery.)  Stop Metformin  2 days prior to surgery. LAST DOSE July 13, 2019  Follow recommendations from Cardiologist, Pulmonologist or PCP regarding stopping Aspirin, Coumadin, Plavix, Eliquis, Pradaxa, or Pletal.  Stop Anti-inflammatories (NSAIDS) such as Advil, Aleve, Ibuprofen, Motrin, Naproxen, Naprosyn and Aspirin based products such as Excedrin, Goodys Powder, BC Powder. (May take Tylenol or Acetaminophen if needed.)  Stop ANY OVER THE COUNTER supplements until after surgery. STOP KRILL OIL AND COq-10 (May continue Vitamin D, Vitamin B, and  multivitamin.)  No Alcohol for 24 hours before or after surgery.  No Smoking including e-cigarettes for 65 hours prior to surgery.  No chewable tobacco products for at least 6 hours prior to surgery.  No nicotine patches on the day of surgery.  Do not use any "recreational" drugs for at least a week prior to your surgery.  Please be advised that the combination of cocaine and anesthesia may have negative outcomes, up to and including death. If you test positive for cocaine, your surgery will be cancelled.  On the morning of surgery brush your teeth with toothpaste and water, you may rinse your mouth with mouthwash if you wish. Do not swallow any toothpaste or mouthwash.  Do not wear jewelry, make-up, hairpins, clips or nail polish.  Do not wear lotions, powders, or perfumes AFTERSHAVE AND DEODORANT  Do not shave 48 hours prior to surgery.   Contact lenses, hearing aids and dentures may not be worn into surgery.  Do not bring valuables to the hospital. [facility] is not responsible for any missing/lost belongings or valuables.   Use CHG Soap  as directed on instruction sheet.  Bring your C-PAP to the hospital with you in case you may have to spend the night.   Notify your doctor if there is any change in your medical condition (cold, fever, infection).  Wear comfortable clothing (specific to your surgery type) to the hospital.  Plan for stool softeners for home use; pain medications have a tendency to cause constipation. You can also help prevent constipation by eating foods high in fiber such as fruits and vegetables  and drinking plenty of fluids as your diet allows.  After surgery, you can help prevent lung complications by doing breathing exercises.  Take deep breaths and cough every 1-2 hours. Your doctor may order a device called an Incentive Spirometer to help you take deep breaths. When coughing or sneezing, hold a pillow firmly against your incision with both hands. This  is called "splinting." Doing this helps protect your incision. It also decreases belly discomfort.  If you are being discharged the day of surgery, you will not be allowed to drive home. You will need a responsible adult (18 years or older) to drive you home and stay with you that night.   If you are taking public transportation, you will need to have a responsible adult (18 years or older) with you. Please confirm with your physician that it is acceptable to use public transportation.   Please call the Pre-admissions Testing Dept. at [phone] if you have any questions about these instructions.  Visitation Policy:  Patients undergoing a surgery or procedure may have one family member or support person with them as long as that person is not COVID-19 positive or experiencing its symptoms.  That person may remain in the waiting area during the procedure.  Children under 11 years of age may have both parents or legal guardians with them during their procedure.  Inpatient Visitation Update:   Two designated support people may visit a patient during visiting hours 7 am to 8 pm. It must be the same two designated people for the duration of the patient stay. The visitors may come and go during the day, and there is no switching out to have different visitors. A mask must be worn at all times, including in the patient room.  Children under 65 years of age:  a total of 4 designated visitors for the child's entire stay are allowed. Only 2 in the room at a time and only one staying overnight at a time. The overnight guest can now rotate during the child's hospital stay.  As a reminder, masks are still required for all [place] team members, patients and visitors in all [facility] facilities.   Systemwide, no visitors 17 or younger.

## 2019-07-09 ENCOUNTER — Other Ambulatory Visit: Admission: RE | Admit: 2019-07-09 | Payer: BC Managed Care – PPO | Source: Ambulatory Visit

## 2019-07-10 ENCOUNTER — Other Ambulatory Visit: Payer: Self-pay

## 2019-07-10 ENCOUNTER — Encounter
Admission: RE | Admit: 2019-07-10 | Discharge: 2019-07-10 | Disposition: A | Discharge status: Home / Self Care | Payer: BC Managed Care – PPO | Source: Ambulatory Visit | Attending: Surgery | Admitting: Surgery

## 2019-07-10 DIAGNOSIS — I10 Essential (primary) hypertension: Secondary | ICD-10-CM | POA: Insufficient documentation

## 2019-07-10 DIAGNOSIS — Z01818 Encounter for other preprocedural examination: Secondary | ICD-10-CM | POA: Diagnosis present

## 2019-07-10 LAB — CBC
HCT: 47.1 % (ref 39.0–52.0)
Hemoglobin: 16 g/dL (ref 13.0–17.0)
MCH: 27.5 pg (ref 26.0–34.0)
MCHC: 34 g/dL (ref 30.0–36.0)
MCV: 80.9 fL (ref 80.0–100.0)
Platelets: 191 10*3/uL (ref 150–400)
RBC: 5.82 MIL/uL — ABNORMAL HIGH (ref 4.22–5.81)
RDW: 14.5 % (ref 11.5–15.5)
WBC: 6.6 10*3/uL (ref 4.0–10.5)
nRBC: 0 % (ref 0.0–0.2)

## 2019-07-10 LAB — BASIC METABOLIC PANEL
Anion gap: 11 (ref 5–15)
BUN: 17 mg/dL (ref 8–23)
CO2: 25 mmol/L (ref 22–32)
Calcium: 9.1 mg/dL (ref 8.9–10.3)
Chloride: 102 mmol/L (ref 98–111)
Creatinine, Ser: 1.12 mg/dL (ref 0.61–1.24)
GFR calc Af Amer: >60 mL/min (ref >60)
GFR calc non Af Amer: >60 mL/min (ref >60)
Glucose, Bld: 70 mg/dL (ref 70–99)
Potassium: 3.8 mmol/L (ref 3.5–5.1)
Sodium: 138 mmol/L (ref 135–145)

## 2019-07-14 ENCOUNTER — Other Ambulatory Visit: Payer: Self-pay

## 2019-07-14 ENCOUNTER — Other Ambulatory Visit
Admission: RE | Admit: 2019-07-14 | Discharge: 2019-07-14 | Disposition: A | Discharge status: Home / Self Care | Payer: BC Managed Care – PPO | Source: Ambulatory Visit | Attending: Surgery | Admitting: Surgery

## 2019-07-14 DIAGNOSIS — Z01812 Encounter for preprocedural laboratory examination: Secondary | ICD-10-CM | POA: Diagnosis present

## 2019-07-14 DIAGNOSIS — Z20822 Contact with and (suspected) exposure to covid-19: Secondary | ICD-10-CM | POA: Insufficient documentation

## 2019-07-14 LAB — SARS CORONAVIRUS 2 (TAT 6-24 HRS): SARS Coronavirus 2: NEGATIVE

## 2019-07-16 ENCOUNTER — Ambulatory Visit: Payer: BC Managed Care – PPO | Admitting: Certified Registered"

## 2019-07-16 ENCOUNTER — Other Ambulatory Visit: Payer: Self-pay

## 2019-07-16 ENCOUNTER — Ambulatory Visit
Admission: RE | Admit: 2019-07-16 | Discharge: 2019-07-16 | Disposition: A | Discharge status: Home / Self Care | Payer: BC Managed Care – PPO | Attending: Surgery | Admitting: Surgery

## 2019-07-16 ENCOUNTER — Ambulatory Visit: Payer: BC Managed Care – PPO

## 2019-07-16 ENCOUNTER — Encounter: Payer: Self-pay | Admitting: Surgery

## 2019-07-16 ENCOUNTER — Encounter
Admission: RE | Disposition: A | Discharge status: Home / Self Care | Payer: Self-pay | Source: Home / Self Care | Attending: Surgery

## 2019-07-16 ENCOUNTER — Ambulatory Visit: Payer: Self-pay

## 2019-07-16 DIAGNOSIS — E119 Type 2 diabetes mellitus without complications: Secondary | ICD-10-CM | POA: Insufficient documentation

## 2019-07-16 DIAGNOSIS — G4733 Obstructive sleep apnea (adult) (pediatric): Secondary | ICD-10-CM | POA: Insufficient documentation

## 2019-07-16 DIAGNOSIS — M7521 Bicipital tendinitis, right shoulder: Secondary | ICD-10-CM | POA: Diagnosis not present

## 2019-07-16 DIAGNOSIS — M75111 Incomplete rotator cuff tear or rupture of right shoulder, not specified as traumatic: Secondary | ICD-10-CM | POA: Insufficient documentation

## 2019-07-16 DIAGNOSIS — Z79899 Other long term (current) drug therapy: Secondary | ICD-10-CM | POA: Diagnosis not present

## 2019-07-16 DIAGNOSIS — Z7982 Long term (current) use of aspirin: Secondary | ICD-10-CM | POA: Diagnosis not present

## 2019-07-16 DIAGNOSIS — Z79890 Hormone replacement therapy (postmenopausal): Secondary | ICD-10-CM | POA: Insufficient documentation

## 2019-07-16 DIAGNOSIS — Z419 Encounter for procedure for purposes other than remedying health state, unspecified: Secondary | ICD-10-CM

## 2019-07-16 DIAGNOSIS — M25811 Other specified joint disorders, right shoulder: Secondary | ICD-10-CM | POA: Insufficient documentation

## 2019-07-16 DIAGNOSIS — I10 Essential (primary) hypertension: Secondary | ICD-10-CM | POA: Diagnosis not present

## 2019-07-16 DIAGNOSIS — F329 Major depressive disorder, single episode, unspecified: Secondary | ICD-10-CM | POA: Diagnosis not present

## 2019-07-16 DIAGNOSIS — M25511 Pain in right shoulder: Secondary | ICD-10-CM

## 2019-07-16 DIAGNOSIS — K219 Gastro-esophageal reflux disease without esophagitis: Secondary | ICD-10-CM | POA: Insufficient documentation

## 2019-07-16 DIAGNOSIS — E039 Hypothyroidism, unspecified: Secondary | ICD-10-CM | POA: Insufficient documentation

## 2019-07-16 DIAGNOSIS — J309 Allergic rhinitis, unspecified: Secondary | ICD-10-CM | POA: Insufficient documentation

## 2019-07-16 DIAGNOSIS — Z7984 Long term (current) use of oral hypoglycemic drugs: Secondary | ICD-10-CM | POA: Insufficient documentation

## 2019-07-16 HISTORY — PX: SHOULDER ARTHROSCOPY WITH SUBACROMIAL DECOMPRESSION AND OPEN ROTATOR C: SHX5688

## 2019-07-16 LAB — GLUCOSE, CAPILLARY
Glucose-Capillary: 97 mg/dL (ref 70–99)
Glucose-Capillary: 134 mg/dL — ABNORMAL HIGH (ref 70–99)

## 2019-07-16 SURGERY — SHOULDER ARTHROSCOPY WITH SUBACROMIAL DECOMPRESSION AND OPEN ROTATOR CUFF REPAIR, OPEN BICEPS TENDON REPAIR
Anesthesia: General | Site: Shoulder | Laterality: Right

## 2019-07-16 MED ORDER — BUPIVACAINE-EPINEPHRINE (PF) 0.5% -1:200000 IJ SOLN
INTRAMUSCULAR | Status: AC
  Filled 2019-07-16: qty 30

## 2019-07-16 MED ORDER — PROMETHAZINE HCL 25 MG/ML IJ SOLN: 6.2500 mg | INTRAMUSCULAR | Status: DC | PRN

## 2019-07-16 MED ORDER — LACTATED RINGERS IV SOLN
INTRAVENOUS | Status: DC | PRN
  Administered 2019-07-16: 2 mL

## 2019-07-16 MED ORDER — PROPOFOL 10 MG/ML IV BOLUS
INTRAVENOUS | Status: AC
  Filled 2019-07-16: qty 20

## 2019-07-16 MED ORDER — ORAL CARE MOUTH RINSE: 15.0000 mL | Freq: Once | OROMUCOSAL | Status: AC

## 2019-07-16 MED ORDER — CEFAZOLIN SODIUM-DEXTROSE 2-4 GM/100ML-% IV SOLN
2.0000 g | INTRAVENOUS | Status: AC
  Administered 2019-07-16: 2 g via INTRAVENOUS

## 2019-07-16 MED ORDER — FENTANYL CITRATE (PF) 100 MCG/2ML IJ SOLN
INTRAMUSCULAR | Status: AC
  Filled 2019-07-16: qty 2

## 2019-07-16 MED ORDER — SODIUM CHLORIDE 0.9 % IV SOLN: INTRAVENOUS | Status: DC

## 2019-07-16 MED ORDER — MIDAZOLAM HCL 2 MG/2ML IJ SOLN: 1.0000 mg | Freq: Once | INTRAMUSCULAR | Status: AC

## 2019-07-16 MED ORDER — ONDANSETRON HCL 4 MG/2ML IJ SOLN
INTRAMUSCULAR | Status: AC
  Filled 2019-07-16: qty 2

## 2019-07-16 MED ORDER — ROCURONIUM BROMIDE 10 MG/ML (PF) SYRINGE
PREFILLED_SYRINGE | INTRAVENOUS | Status: AC
  Filled 2019-07-16: qty 10

## 2019-07-16 MED ORDER — LIDOCAINE HCL (PF) 1 % IJ SOLN
INTRAMUSCULAR | Status: AC
  Filled 2019-07-16: qty 5

## 2019-07-16 MED ORDER — EPHEDRINE 5 MG/ML INJ
INTRAVENOUS | Status: AC
  Filled 2019-07-16: qty 10

## 2019-07-16 MED ORDER — FENTANYL CITRATE (PF) 100 MCG/2ML IJ SOLN: 25.0000 ug | INTRAMUSCULAR | Status: DC | PRN

## 2019-07-16 MED ORDER — BUPIVACAINE LIPOSOME 1.3 % IJ SUSP
INTRAMUSCULAR | Status: DC | PRN
Start: 2019-07-16 — End: 2019-07-16
  Administered 2019-07-16: 20 mL via PERINEURAL

## 2019-07-16 MED ORDER — CEFAZOLIN SODIUM-DEXTROSE 2-4 GM/100ML-% IV SOLN
INTRAVENOUS | Status: AC
  Filled 2019-07-16: qty 100

## 2019-07-16 MED ORDER — SUCCINYLCHOLINE CHLORIDE 200 MG/10ML IV SOSY
PREFILLED_SYRINGE | INTRAVENOUS | Status: AC
  Filled 2019-07-16: qty 10

## 2019-07-16 MED ORDER — EPINEPHRINE PF 1 MG/ML IJ SOLN
INTRAMUSCULAR | Status: AC
  Filled 2019-07-16: qty 2

## 2019-07-16 MED ORDER — IPRATROPIUM-ALBUTEROL 0.5-2.5 (3) MG/3ML IN SOLN: 3.0000 mL | Freq: Once | RESPIRATORY_TRACT | Status: AC

## 2019-07-16 MED ORDER — ONDANSETRON HCL 4 MG/2ML IJ SOLN: 4.0000 mg | Freq: Once | INTRAMUSCULAR | Status: DC

## 2019-07-16 MED ORDER — MIDAZOLAM HCL 2 MG/2ML IJ SOLN
INTRAMUSCULAR | Status: AC
  Administered 2019-07-16: 1 mg via INTRAVENOUS
  Filled 2019-07-16: qty 2

## 2019-07-16 MED ORDER — SUGAMMADEX SODIUM 200 MG/2ML IV SOLN
INTRAVENOUS | Status: DC | PRN
  Administered 2019-07-16: 100 mg via INTRAVENOUS

## 2019-07-16 MED ORDER — MIDAZOLAM HCL 2 MG/2ML IJ SOLN
INTRAMUSCULAR | Status: AC
  Filled 2019-07-16: qty 2

## 2019-07-16 MED ORDER — FENTANYL CITRATE (PF) 100 MCG/2ML IJ SOLN
INTRAMUSCULAR | Status: AC
  Administered 2019-07-16: 50 ug via INTRAVENOUS
  Filled 2019-07-16: qty 2

## 2019-07-16 MED ORDER — CHLORHEXIDINE GLUCONATE 0.12 % MT SOLN
OROMUCOSAL | Status: AC
  Administered 2019-07-16: 15 mL via OROMUCOSAL
  Filled 2019-07-16: qty 15

## 2019-07-16 MED ORDER — CHLORHEXIDINE GLUCONATE 0.12 % MT SOLN: 15.0000 mL | Freq: Once | OROMUCOSAL | Status: AC

## 2019-07-16 MED ORDER — ROCURONIUM BROMIDE 100 MG/10ML IV SOLN
INTRAVENOUS | Status: DC | PRN
  Administered 2019-07-16: 50 mg via INTRAVENOUS

## 2019-07-16 MED ORDER — FENTANYL CITRATE (PF) 100 MCG/2ML IJ SOLN: 50.0000 ug | Freq: Once | INTRAMUSCULAR | Status: AC

## 2019-07-16 MED ORDER — LIDOCAINE HCL (PF) 1 % IJ SOLN
INTRAMUSCULAR | Status: DC | PRN
Start: 2019-07-16 — End: 2019-07-16
  Administered 2019-07-16: 5 mL via SUBCUTANEOUS

## 2019-07-16 MED ORDER — LACTATED RINGERS IV SOLN: INTRAVENOUS | Status: DC | PRN

## 2019-07-16 MED ORDER — PROPOFOL 10 MG/ML IV BOLUS
INTRAVENOUS | Status: DC | PRN
  Administered 2019-07-16: 170 mg via INTRAVENOUS
  Administered 2019-07-16: 30 mg via INTRAVENOUS

## 2019-07-16 MED ORDER — OXYCODONE HCL 5 MG PO TABS: 5.0000 mg | ORAL_TABLET | ORAL | 0 refills | Status: DC | PRN

## 2019-07-16 MED ORDER — IPRATROPIUM-ALBUTEROL 0.5-2.5 (3) MG/3ML IN SOLN
RESPIRATORY_TRACT | Status: AC
  Administered 2019-07-16: 3 mL via RESPIRATORY_TRACT
  Filled 2019-07-16: qty 3

## 2019-07-16 MED ORDER — ONDANSETRON HCL 4 MG/2ML IJ SOLN
INTRAMUSCULAR | Status: DC | PRN
  Administered 2019-07-16: 4 mg via INTRAVENOUS

## 2019-07-16 MED ORDER — FENTANYL CITRATE (PF) 100 MCG/2ML IJ SOLN
INTRAMUSCULAR | Status: DC | PRN
  Administered 2019-07-16: 50 ug via INTRAVENOUS
  Administered 2019-07-16: 100 ug via INTRAVENOUS

## 2019-07-16 MED ORDER — LIDOCAINE HCL (PF) 2 % IJ SOLN
INTRAMUSCULAR | Status: AC
  Filled 2019-07-16: qty 5

## 2019-07-16 MED ORDER — BUPIVACAINE HCL (PF) 0.5 % IJ SOLN
INTRAMUSCULAR | Status: AC
  Filled 2019-07-16: qty 10

## 2019-07-16 MED ORDER — EPHEDRINE SULFATE 50 MG/ML IJ SOLN
INTRAMUSCULAR | Status: DC | PRN
  Administered 2019-07-16: 10 mg via INTRAVENOUS
  Administered 2019-07-16: 15 mg via INTRAVENOUS
  Administered 2019-07-16 (×2): 10 mg via INTRAVENOUS
  Administered 2019-07-16: 5 mg via INTRAVENOUS
  Administered 2019-07-16 (×2): 10 mg via INTRAVENOUS
  Administered 2019-07-16: 5 mg via INTRAVENOUS

## 2019-07-16 MED ORDER — SUCCINYLCHOLINE CHLORIDE 20 MG/ML IJ SOLN
INTRAMUSCULAR | Status: DC | PRN
  Administered 2019-07-16: 120 mg via INTRAVENOUS

## 2019-07-16 MED ORDER — BUPIVACAINE-EPINEPHRINE 0.5% -1:200000 IJ SOLN
INTRAMUSCULAR | Status: DC | PRN
  Administered 2019-07-16: 30 mL

## 2019-07-16 MED ORDER — LIDOCAINE HCL (CARDIAC) PF 100 MG/5ML IV SOSY
PREFILLED_SYRINGE | INTRAVENOUS | Status: DC | PRN
  Administered 2019-07-16: 60 mg via INTRAVENOUS

## 2019-07-16 MED ORDER — BUPIVACAINE HCL (PF) 0.5 % IJ SOLN
INTRAMUSCULAR | Status: DC | PRN
Start: 2019-07-16 — End: 2019-07-16
  Administered 2019-07-16: 10 mL via PERINEURAL

## 2019-07-16 MED ORDER — BUPIVACAINE LIPOSOME 1.3 % IJ SUSP
INTRAMUSCULAR | Status: AC
  Filled 2019-07-16: qty 20

## 2019-07-16 SURGICAL SUPPLY — 54 items
ANCHOR HEALICOIL REGEN 5.5 (Anchor) ×6 IMPLANT
ANCHOR JUGGERKNOT WTAP NDL 2.9 (Anchor) ×3 IMPLANT
BIT DRILL JUGRKNT W/NDL BIT2.9 (DRILL) ×1 IMPLANT
BLADE FULL RADIUS 3.5 (BLADE) ×3 IMPLANT
BUR ACROMIONIZER 4.0 (BURR) ×3 IMPLANT
CANNULA SHAVER 8MMX76MM (CANNULA) ×3 IMPLANT
CHLORAPREP W/TINT 26 (MISCELLANEOUS) ×3 IMPLANT
COVER MAYO STAND REUSABLE (DRAPES) ×3 IMPLANT
COVER WAND RF STERILE (DRAPES) ×3 IMPLANT
DILATOR 5.5 THREADED HEALICOIL (MISCELLANEOUS) ×3 IMPLANT
DRAPE IMP U-DRAPE 54X76 (DRAPES) ×6 IMPLANT
DRILL JUGGERKNOT W/NDL BIT 2.9 (DRILL) ×3
ELECT CAUTERY BLADE TIP 2.5 (TIP) ×3
ELECT REM PT RETURN 9FT ADLT (ELECTROSURGICAL) ×3
ELECTRODE CAUTERY BLDE TIP 2.5 (TIP) ×1 IMPLANT
ELECTRODE REM PT RTRN 9FT ADLT (ELECTROSURGICAL) ×1 IMPLANT
GAUZE SPONGE 4X4 12PLY STRL (GAUZE/BANDAGES/DRESSINGS) ×3 IMPLANT
GAUZE XEROFORM 1X8 LF (GAUZE/BANDAGES/DRESSINGS) ×3 IMPLANT
GLOVE BIO SURGEON STRL SZ7 (GLOVE) ×3 IMPLANT
GLOVE BIO SURGEON STRL SZ7.5 (GLOVE) ×9 IMPLANT
GLOVE BIO SURGEON STRL SZ8 (GLOVE) ×9 IMPLANT
GLOVE BIOGEL PI IND STRL 7.0 (GLOVE) ×1 IMPLANT
GLOVE BIOGEL PI IND STRL 8 (GLOVE) ×1 IMPLANT
GLOVE BIOGEL PI INDICATOR 7.0 (GLOVE) ×2
GLOVE BIOGEL PI INDICATOR 8 (GLOVE) ×2
GLOVE INDICATOR 8.0 STRL GRN (GLOVE) ×3 IMPLANT
GOWN STRL REUS W/ TWL LRG LVL3 (GOWN DISPOSABLE) ×1 IMPLANT
GOWN STRL REUS W/ TWL XL LVL3 (GOWN DISPOSABLE) ×1 IMPLANT
GOWN STRL REUS W/TWL LRG LVL3 (GOWN DISPOSABLE) ×2
GOWN STRL REUS W/TWL XL LVL3 (GOWN DISPOSABLE) ×2
GRASPER SUT 15 45D LOW PRO (SUTURE) IMPLANT
IV LACTATED RINGER IRRG 3000ML (IV SOLUTION) ×4
IV LACTATED RINGERS 1000ML (IV SOLUTION) ×3 IMPLANT
IV LR IRRIG 3000ML ARTHROMATIC (IV SOLUTION) ×2 IMPLANT
KIT CANNULA 8X76-LX IN CANNULA (CANNULA) IMPLANT
MANIFOLD NEPTUNE II (INSTRUMENTS) ×3 IMPLANT
MASK FACE SPIDER DISP (MASK) ×3 IMPLANT
MAT ABSORB  FLUID 56X50 GRAY (MISCELLANEOUS) ×2
MAT ABSORB FLUID 56X50 GRAY (MISCELLANEOUS) ×1 IMPLANT
PACK SHDR ARTHRO (MISCELLANEOUS) ×3 IMPLANT
PASSER SUT FIRSTPASS SELF (INSTRUMENTS) IMPLANT
SLING ARM LRG DEEP (SOFTGOODS) ×3 IMPLANT
SLING ULTRA II LG (MISCELLANEOUS) IMPLANT
STAPLER SKIN PROX 35W (STAPLE) ×3 IMPLANT
STRAP SAFETY 5IN WIDE (MISCELLANEOUS) ×3 IMPLANT
SUT ETHIBOND 0 MO6 C/R (SUTURE) ×3 IMPLANT
SUT FIBERWIRE #2 38 T-5 BLUE (SUTURE) ×6
SUT ULTRABRAID 2 COBRAID 38 (SUTURE) IMPLANT
SUT VIC AB 2-0 CT1 27 (SUTURE) ×4
SUT VIC AB 2-0 CT1 TAPERPNT 27 (SUTURE) ×2 IMPLANT
SUTURE FIBERWR #2 38 T-5 BLUE (SUTURE) ×2 IMPLANT
TAPE MICROFOAM 4IN (TAPE) ×3 IMPLANT
TUBING ARTHRO INFLOW-ONLY STRL (TUBING) ×3 IMPLANT
WAND WEREWOLF FLOW 90D (MISCELLANEOUS) ×3 IMPLANT

## 2019-07-16 NOTE — Op Note (Addendum)
07/16/2019  1:44 PM  Patient:   Jacob Lawson  Pre-Op Diagnosis:   Impingement/tendinopathy with partial-thickness rotator cuff tear, right shoulder.  Post-Op Diagnosis:   Impingement/tendinopathy with near full-thickness bursal sided rotator cuff tear, degenerative labral fraying, and biceps tendinopathy, right shoulder.  Procedure:   Limited arthroscopic debridement, arthroscopic subacromial decompression, mini-open rotator cuff repair, and mini-open biceps tenodesis, right shoulder.  Anesthesia:   General endotracheal with interscalene block using Exparel placed preoperatively by the anesthesiologist.  Surgeon:   Maryagnes Amos, MD  Assistant:   Horris Latino, PA-C; Nyra Jabs, PA-S  Findings:   As above.  There was some moderate fraying of the labrum anteriorly without detachment from the glenoid rim.  The rotator cuff appeared to be in good condition on the articular side, but there was an approximately 80% bursal sided tear involving the mid and anterior insertional fibers of the supraspinatus measuring approximately 2 x 2 cm.  The biceps tendon demonstrated some "lip sticking", but there was no partial or full-thickness tearing.  The articular surfaces of the glenoid and humerus both were in excellent condition.  The capsule around the [facility] joint appeared to be in good shape and there was minimal motion across the [facility] joint, so it was elected not to do a distal clavicle excision.  Complications:   None  Fluids:   1150 cc  Estimated blood loss:   25 cc  Tourniquet time:   None  Drains:   None  Closure:   Staples      Brief clinical note:   The patient is a 64 year old male with a history of progressively worsening right shoulder pain. The patient's symptoms have progressed despite medications, activity modification, etc. The patient's history and examination are consistent with impingement/tendinopathy with a probable rotator cuff tear. These findings were confirmed by  diagnostic ultrasound. The patient presents at this time for definitive management of these shoulder symptoms.  Procedure:   The patient underwent placement of an interscalene block using Exparel by the anesthesiologist in the preoperative holding area before being brought into the operating room and lain in the supine position. The patient then underwent general endotracheal intubation and anesthesia before being repositioned in the beach chair position using the beach chair positioner. The right shoulder and upper extremity were prepped with ChloraPrep solution before being draped sterilely. Preoperative antibiotics were administered. A timeout was performed to confirm the proper surgical site before the expected portal sites and incision site were injected with 0.5% Sensorcaine with epinephrine. A posterior portal was created and the glenohumeral joint thoroughly inspected with the findings as described above. An anterior portal was created using an outside-in technique. The labrum and rotator cuff were further probed, again confirming the above-noted findings. The areas of labral fraying were debrided back to stable margins using the full-radius resector. The ArthroCare wand was inserted and used to release the biceps tendon from its labral anchor, as well as to debride the rotator interval. In addition, the ArthroCare wand was used to obtain hemostasis as well as to "anneal" the labrum superiorly and anteriorly. The instruments were removed from the joint after suctioning the excess fluid.  The camera was repositioned through the posterior portal into the subacromial space. A separate lateral portal was created using an outside-in technique. The 3.5 mm full-radius resector was introduced and used to perform a subtotal bursectomy. The ArthroCare wand was then inserted and used to remove the periosteal tissue off the undersurface of the anterior third of the  acromion as well as to recess the coracoacromial  ligament from its attachment along the anterior and lateral margins of the acromion. The 4.0 mm acromionizing bur was introduced and used to complete the decompression by removing the undersurface of the anterior third of the acromion. The full radius resector was reintroduced to remove any residual bony debris before the ArthroCare wand was reintroduced to obtain hemostasis. The instruments were then removed from the subacromial space after suctioning the excess fluid.  An approximately 4-5 cm incision was made over the anterolateral aspect of the shoulder beginning at the anterolateral corner of the acromion and extending distally in line with the bicipital groove. This incision was carried down through the subcutaneous tissues to expose the deltoid fascia. The raphae between the anterior and middle thirds was identified and this plane developed to provide access into the subacromial space. Additional bursal tissues were debrided sharply using Metzenbaum scissors. The rotator cuff tear was readily identified. The margins were debrided sharply with a #15 blade. The tear was repaired using several #0 Ethibond interrupted sutures placed in a side-to-side fashion. In addition, two #2 FiberWire's were placed in a horizontal mattress fashion and brought back laterally to be secured using a single [employer] Healicoil knotless Regenesorb anchor to create a two-layer closure. An apparent watertight closure was obtained.  The bicipital groove was identified by palpation and opened for 1-1.5 cm. The biceps tendon stump was retrieved through this defect. The floor of the bicipital groove was roughened with a curet before a single Biomet 2.9 mm JuggerKnot anchor was inserted. Both sets of sutures were passed through the biceps tendon and tied securely to effect the tenodesis. The bicipital sheath was reapproximated using two #0 Ethibond interrupted sutures, incorporating the biceps tendon to further reinforce the  tenodesis.  The wound was copiously irrigated with sterile saline solution before the deltoid raphae was reapproximated using 2-0 Vicryl interrupted sutures. The subcutaneous tissues were closed in two layers using 2-0 Vicryl interrupted sutures before the skin was closed using staples. The portal sites also were closed using staples. A sterile bulky dressing was applied to the shoulder before the arm was placed into a shoulder immobilizer. The patient was then awakened, extubated, and returned to the recovery room in satisfactory condition after tolerating the procedure well.

## 2019-07-16 NOTE — Transfer of Care (Signed)
Immediate Anesthesia Transfer of Care Note  Patient: Jacob Lawson  Procedure(s) Performed: Right shoulder arthroscopy with debridement, decompression, possible rotator cuff repair, and probable biceps tenodesis (Right Shoulder)  Patient Location: PACU  Anesthesia Type:General and Regional  Level of Consciousness: awake, alert  and oriented  Airway & Oxygen Therapy: Patient Spontanous Breathing and Patient connected to face mask oxygen  Post-op Assessment: Report given to RN and Post -op Vital signs reviewed and stable  Post vital signs: Reviewed and stable    Last Vitals:  Vitals Value Taken Time  BP 142/97 07/16/19 1020  Temp 36.2 C 07/16/19 1019  Pulse 73 07/16/19 1025  Resp 16 07/16/19 1025  SpO2 94 % 07/16/19 1025  Vitals shown include unvalidated device data.  Last Pain:  Vitals:   07/16/19 1019  TempSrc:   PainSc: (P) Asleep         Complications: No complications documented.

## 2019-07-16 NOTE — Anesthesia Procedure Notes (Signed)
Anesthesia Regional Block: Interscalene brachial plexus block   Pre-Anesthetic Checklist: ,, timeout performed, Correct Patient, Correct Site, Correct Laterality, Correct Procedure, Correct Position, site marked, Risks and benefits discussed,  Surgical consent,  Pre-op evaluation,  At surgeon's request and post-op pain management  Laterality: Right and Upper  Prep: chloraprep       Needles:  Injection technique: Single-shot  Needle Type: Stimiplex     Needle Length: 5cm  Needle Gauge: 22     Additional Needles:   Procedures:,,,, ultrasound used (permanent image in chart),,,,  Narrative:  Start time: 07/16/2019 7:31 AM End time: 07/16/2019 7:36 AM Injection made incrementally with aspirations every 5 mL.  Performed by: Personally  Anesthesiologist: Lenard Simmer, MD  Additional Notes: Functioning IV was confirmed and monitors were applied.  A 63mm 22ga Stimuplex needle was used. Sterile prep and drape,hand hygiene and sterile gloves were used.  Negative aspiration and negative test dose prior to incremental administration of local anesthetic. The patient tolerated the procedure well.

## 2019-07-16 NOTE — H&P (Signed)
History of Present Illness: Jacob Lawson is a 64 y.o. male who presents today for his surgical history and physical for upcoming right shoulder procedure. The patient is scheduled for a right shoulder arthroscopy with debridement, decompression, possible rotator cuff repair, possible biceps tenodesis and excision of the distal clavicle. Surgery is scheduled with Dr. Roland Rack on 07/16/2019. Overall the patient feels that he is doing well. Pain score today is a 5 out of 10. He denies any numbness or tingling to the right upper extremity at today's visit. He denies any recent trauma or injury affecting the right shoulder. Denies any personal history of heart attack, stroke, asthma or COPD. No personal history of blood clots.  Past Medical History: . Allergic rhinitis  . Aortic atherosclerosis (CMS-HCC) 11/14/2017  3.0 x 3.0 cm aneurysm on 08/07/2017 [facility] screening; no aneurysm on 11/14/2017 CT, but did show atherosclerosis  . Bacteremia due to Gram-negative bacteria 11/14/2018  Due to infected puncture wound of left foot; admitted at [facility] 10/16 to 11/10/2018 and treated with I&D, IV Unasyn x 10 days  . Carpal tunnel syndrome, bilateral  S/P R CT release  . Chronic low back pain  . Chronic urticaria  . Colloid thyroid nodule 07/11/2016  Left thyroid nodule, biopsied by Dr. Kathyrn Sheriff; folow up annually  . Depression  Mild  . Diabetes mellitus type 2, uncomplicated (CMS-HCC)  . Ectatic abdominal aorta (CMS-HCC) 11/14/2017  On 11/14/2017 CT; radiologist advised ultrasound in 2024  . Essential hypertension, benign 08/13/2013  . Fatty liver 02/20/2017  Suggested by appearance on 02/19/2017 ultrasound  . GERD (gastroesophageal reflux disease)  with gastritis on 10/2008 EGD  . Hyperlipidemia  . Hypogonadism in male  S/P Testim implant 03/2012  . Hypothyroidism  . Migraine headache  . Occipital neuralgia  Dr. Manuella Ghazi  . Sleep apnea  OSA on CPAP  . Vitiligo   Past Surgical History: . APPENDECTOMY  .  ARTHROSCOPIC ROTATOR CUFF REPAIR Left 02/2005  . BIOPSY PROSTATE NEEDLE/PUNCH 12/2015  Dr. Rogers Blocker-- normal result  . COLONOSCOPY 03/19/1989  Normal Colon  . COLONOSCOPY 07/04/1998  Adenomatous Polyp  . COLONOSCOPY 02/04/2002  PH Adenomatous Polyp  . COLONOSCOPY 01/21/2012, 10/28/2008, 08/19/2006  Adenomatous Polyps: [facility] 12/2016; Recall Ltr mailed 12/11/2016 (dw)  . COLONOSCOPY 02/26/2017  Adenomatous Polyps: [facility] 02/2020  . Congenital finger webbing surgery  . EGD 10/28/2008, 08/19/2006, 02/04/2002, 07/04/1998, 01/24/1993, 03/19/1989  . ENDOSCOPIC CARPAL TUNNEL RELEASE Right 11/2003  . ENDOSCOPIC CARPAL TUNNEL RELEASE Left 11/19/2017  Dr. Little Ishikawa  . Excision of neuroma Left 06/2007  Left foot  . Mandibular surgery in his late 69's  for overbite  . Partial thyroidectomy for benign thyroid nodule  . TONSILLECTOMY AND ADENOIDECTOMY   Past Family History: Family History  Problem Relation Age of Onset  . Cancer Maternal Grandfather  oral cancer  . Pancreatic cancer Father 3  . Dementia Mother  . Prostate cancer Brother  . Bipolar disorder Other  . Ovarian cancer Sister  . Brain cancer Paternal Aunt   Medications: . aspirin 81 MG chewable tablet Take 81 mg by mouth once daily.  . cholecalciferol (VITAMIN D3) 1000 unit capsule Take 1,000 Units by mouth 2 (two) times daily  . cyanocobalamin (VITAMIN B12) 500 MCG tablet Take 500 mcg by mouth once daily.  Marland Kitchen esomeprazole (NEXIUM) 20 MG DR capsule TAKE 1 CAPSULE (20 MG TOTAL) BY MOUTH ONCE DAILY. OR EVERY OTHER DAY FOR REFLUX (Patient taking differently: Take 20 mg by mouth once daily as needed around every 3-4  days day for reflux ) 30 capsule 0  . finasteride (PROSCAR) 5 mg tablet Take 5 mg by mouth once daily. 3  . fluticasone (FLONASE) 50 mcg/actuation nasal spray Place 1-2 sprays into both nostrils once daily as needed for Allergies 16 g prn  . folic acid/multivit-min/lutein (CENTRUM SILVER ORAL) Take 1 tablet by mouth once daily   . krill-om3-dha-epa-om6-lip-astx (KRILL OIL, OMEGA 3 AND 6,) 1000-130(40-80) mg Cap Take 1 capsule by mouth 2 (two) times daily  . levothyroxine (SYNTHROID) 125 MCG tablet Take 1 tablet (125 mcg total) by mouth every morning For thyroid 90 tablet 3  . lisinopriL (ZESTRIL) 40 MG tablet Take 1 tablet (40 mg total) by mouth once daily For blood pressure 90 tablet 3  . loratadine (CLARITIN) 10 mg tablet Take 10 mg by mouth once daily as needed for Allergies.  . metFORMIN (GLUCOPHAGE-XR) 500 MG XR tablet Take 2 tablets (1,000 mg total) by mouth daily with breakfast 180 tablet 3  . metoprolol succinate (TOPROL-XL) 50 MG XL tablet Take 1 tablet (50 mg total) by mouth once daily For blood pressure 90 tablet 3  . sertraline (ZOLOFT) 50 MG tablet Take 1 tablet (50 mg total) by mouth once daily 90 tablet 3  . testosterone (ANDROGEL) 1.25 gram/actuation topical gel 1 application transdermally daily (Patient taking differently: once daily. )  . UBIDECARENONE (COENZYME Q10) 50 mg Tab Take 50 mg by mouth once daily.   No current Epic-ordered facility-administered medications on file.   Allergies: No Active Allergies   Review of Systems:  A comprehensive 14 point ROS was performed, reviewed by me today, and the pertinent orthopaedic findings are documented in the HPI.  Physical Exam: BP 134/80 (BP Location: Left upper arm, Patient Position: Sitting, BP Cuff Size: Adult)  Ht 177.8 cm (5\' 10" )  Wt (!) 118.9 kg (262 lb 3.2 oz)  BMI 37.62 kg/m  General/Constitutional: The patient appears to be well-nourished, well-developed, and in no acute distress. Neuro/Psych: Normal mood and affect, oriented to person, place and time. Eyes: Non-icteric. Pupils are equal, round, and reactive to light, and exhibit synchronous movement. ENT: Unremarkable. Lymphatic: No palpable adenopathy. Respiratory: Lungs clear to auscultation, Normal chest excursion, No wheezes and Non-labored breathing Cardiovascular: Regular rate  and rhythm. No murmurs. and No edema, swelling or tenderness, except as noted in detailed exam. Integumentary: No impressive skin lesions present, except as noted in detailed exam. Musculoskeletal: Unremarkable, except as noted in detailed exam.  Rightshoulder exam: SKIN:Normal SWELLING:None WARMTH:None LYMPH NODES: No adenopathy palpable CREPITUS:None TENDERNESS:Mild-moderately tender over anterior shoulder, and mildly tender over AC joint or superiorly ROM (active):  Forward flexion:150 degrees Abduction: 145 degrees Internal rotation: L2 ROM (passive):  Forward flexion: 155 degrees Abduction: 150 degrees ER/IR at 90 abd: 85 degrees/40 degrees  He describes moderate pain with forward flexion, abduction, and internal rotation at 90 degrees of abduction, and mild pain with internal rotation and with external rotation at 90 degrees of abduction.  STRENGTH: Forward flexion: 4+/5 Abduction: 4+/5 External rotation: 4/5 Internal rotation: 4+/5 Pain with RC testing: Mild-moderate pain with resisted external rotation and forward flexion  STABILITY:Normal  SPECIAL TESTS: ' test: Moderately positive Speed's test: Mildly positive Capsulitis - pain w/ passive ER: No Crossed arm test: Mildly positive Crank: Not evaluated Anterior apprehension: Negative Posterior apprehension: Not evaluated  Heis neurovascularly intact to the rightupper extremity and hand.  X-rays/MRI/Lab data:  A recent ultrasound evaluation of his right shoulder has been obtained and is available for review. By report,  the study demonstrates evidence of  supraspinatus tendinopathy with possible interstitial and articular sided partial-thickness tears. The remainder of the rotator cuff was in satisfactory condition. There was evidence of tendinosis of the long head of the biceps tendon, as well as moderate degenerative changes of the [facility] joint.   Impression: Rotator cuff tendinitis, right  Nontraumatic incomplete tear of right rotator cuff  Plan:  1. Treatment options were discussed today with the patient. 2. The patient is scheduled for a right shoulder arthroscopy with Dr. Joice Lofts on 07/16/2019. 3. The patient was instructed on the risk and benefits of surgery and wishes to proceed at this time. 4. The patient was offered and received a sling shot right shoulder sling to wear postoperatively. 5. This document will serve as a surgical history and physical for the patient. 6. The patient will follow-up per standard postop protocol. They can call the clinic they have any questions, new symptoms develop or symptoms worsen.  The procedure was discussed with the patient, as were the potential risks (including bleeding, infection, nerve and/or blood vessel injury, persistent or recurrent pain, failure of the repair, progression of arthritis, need for further surgery, blood clots, strokes, heart attacks and/or arhythmias, pneumonia, etc.) and benefits. The patient states his understanding and wishes to proceed.   H&P reviewed and patient re-examined. No changes.

## 2019-07-16 NOTE — Anesthesia Procedure Notes (Signed)
Procedure Name: Intubation Date/Time: 07/16/2019 7:52 AM Performed by: Rona Ravens, CRNA Pre-anesthesia Checklist: Patient identified, Emergency Drugs available, Suction available, Patient being monitored and Timeout performed Patient Re-evaluated:Patient Re-evaluated prior to induction Oxygen Delivery Method: Circle system utilized Preoxygenation: Pre-oxygenation with 100% oxygen Induction Type: IV induction Ventilation: Mask ventilation without difficulty Laryngoscope Size: McGraph and 4 (First attempt with MAC 4 grade 3 airway unable to visualize cords, mask ventilated, switched to [place] 1 attempt) Grade View: Grade II Tube type: Oral Tube size: 7.5 mm Number of attempts: 2 Airway Equipment and Method: Stylet Placement Confirmation: ETT inserted through vocal cords under direct vision,  positive ETCO2 and breath sounds checked- equal and bilateral Secured at: 22 cm Tube secured with: Tape Dental Injury: Teeth and Oropharynx as per pre-operative assessment  Difficulty Due To: Difficulty was unanticipated Future Recommendations: Recommend- induction with short-acting agent, and alternative techniques readily available

## 2019-07-16 NOTE — Anesthesia Preprocedure Evaluation (Addendum)
Anesthesia Evaluation  Patient identified by MRN, date of birth, ID band Patient awake    Reviewed: Allergy & Precautions, H&P , NPO status , Patient's Chart, lab work & pertinent test results  History of Anesthesia Complications Negative for: history of anesthetic complications  Airway Mallampati: III  TM Distance: <3 FB Neck ROM: full    Dental  (+) Chipped, Dental Advidsory Given   Pulmonary neg shortness of breath, sleep apnea and Continuous Positive Airway Pressure Ventilation , neg recent URI,           Cardiovascular Exercise Tolerance: Good hypertension, (-) angina(-) Past MI, (-) Cardiac Stents and (-) DOE (-) dysrhythmias (-) Valvular Problems/Murmurs     Neuro/Psych neg Seizures PSYCHIATRIC DISORDERS Depression  Neuromuscular disease    GI/Hepatic Neg liver ROS, GERD  Medicated and Controlled,  Endo/Other  diabetes, Type 2Hypothyroidism   Renal/GU CRFRenal disease     Musculoskeletal   Abdominal   Peds  Hematology negative hematology ROS (+)   Anesthesia Other Findings Past Medical History: No date: Carpal tunnel syndrome on both sides No date: Depression No date: Diabetes mellitus without complication (HCC) No date: GERD (gastroesophageal reflux disease) No date: Hyperlipidemia No date: Hypertension No date: Hypothyroidism No date: Sleep apnea     Comment:  CPAP No date: Wears hearing aid in both ears  Past Surgical History: No date: APPENDECTOMY 11/2003: CARPAL TUNNEL RELEASE; Right 11/19/2017: CARPAL TUNNEL RELEASE; Left     Comment:  Procedure: LEFT OPEN CARPAL TUNNEL RELEASE;  Surgeon:               Leanor Kail, MD;  Location: [place];                Service: Orthopedics;  Laterality: Left;  Diabetic - oral              meds Sleep Apnea No date: COLONOSCOPY     Comment:  2/91, 6/00, 01/04, 7/08, 10/10, 12/13, 02/26/17 No date: ESOPHAGOGASTRODUODENOSCOPY     Comment:  2/91,  1/95, 6/00, 1/04, 7/08, 10/10 06/2007: EXCISION NEUROMA; Left     Comment:  foot No date: FINGER SURGERY     Comment:  for congenital webbing No date: MANDIBLE SURGERY     Comment:  in late 64s, fo overbite 12/2015: PROSTATE BIOPSY 02/2005: ROTATOR CUFF REPAIR; Left No date: THYROIDECTOMY, PARTIAL No date: TONSILLECTOMY     Comment:  and adenoidectomy  BMI    Body Mass Index: 36.09 kg/m      Reproductive/Obstetrics negative OB ROS                             Anesthesia Physical  Anesthesia Plan  ASA: III  Anesthesia Plan: General   Post-op Pain Management:  Regional for Post-op pain   Induction: Intravenous  PONV Risk Score and Plan: Dexamethasone, Ondansetron, Midazolam and Treatment may vary due to age or medical condition  Airway Management Planned: Oral ETT  Additional Equipment:   Intra-op Plan:   Post-operative Plan: Extubation in OR  Informed Consent: I have reviewed the patients History and Physical, chart, labs and discussed the procedure including the risks, benefits and alternatives for the proposed anesthesia with the patient or authorized representative who has indicated his/her understanding and acceptance.     Dental Advisory Given  Plan Discussed with: Anesthesiologist, CRNA and Surgeon  Anesthesia Plan Comments: (Patient consented for risks of anesthesia including but not limited to:  -  adverse reactions to medications - damage to teeth, lips or other oral mucosa - sore throat or hoarseness - Damage to heart, brain, lungs or loss of life  Patient voiced understanding.)       Anesthesia Quick Evaluation

## 2019-07-16 NOTE — Discharge Instructions (Addendum)
AMBULATORY SURGERY  DISCHARGE INSTRUCTIONS   1) The drugs that you were given will stay in your system until tomorrow so for the next 24 hours you should not:  A) Drive an automobile B) Make any legal decisions C) Drink any alcoholic beverage   2) You may resume regular meals tomorrow.  Today it is better to start with liquids and gradually work up to solid foods.  You may eat anything you prefer, but it is better to start with liquids, then soup and crackers, and gradually work up to solid foods.   3) Please notify your doctor immediately if you have any unusual bleeding, trouble breathing, redness and pain at the surgery site, drainage, fever, or pain not relieved by medication.    4) Additional Instructions:        Please contact your physician with any problems or Same Day Surgery at [phone], Monday through Friday 6 am to 4 pm, or [place] at [facility] number at [phone].Orthopedic discharge instructions: Keep dressing dry and intact.  May shower after dressing changed on post-op day #4 (Monday).  Cover staples/sutures with Band-Aids after drying off. Apply ice frequently to shoulder. Take ibuprofen 800 mg TID with meals for 7-10 days, then as necessary. Take oxycodone as prescribed when needed.  May supplement with ES Tylenol if necessary. Keep shoulder immobilizer on at all times except may remove for bathing purposes. Follow-up in 10-14 days or as scheduled.     Interscalene Nerve Block with Exparel  1.  For your surgery you have received an Interscalene Nerve Block with Exparel. 2. Nerve Blocks affect many types of nerves, including nerves that control movement, pain and normal sensation.  You may experience feelings such as numbness, tingling, heaviness, weakness or the inability to move your arm or the feeling or sensation that your arm has "fallen asleep". 3. A nerve block with Exparel can last up to 5 days.  Usually the weakness wears off first.   The tingling and heaviness usually wear off next.  Finally you may start to notice pain.  Keep in mind that this may occur in any order.  Once a nerve block starts to wear off it is usually completely gone within 60 minutes. 4. ISNB may cause mild shortness of breath, a hoarse voice, blurry vision, unequal pupils, or drooping of the face on the same side as the nerve block.  These symptoms will usually resolve with the numbness.  Very rarely the procedure itself can cause mild seizures. 5. If needed, your surgeon will give you a prescription for pain medication.  It will take about 60 minutes for the oral pain medication to become fully effective.  So, it is recommended that you start taking this medication before the nerve block first begins to wear off, or when you first begin to feel discomfort. 6. Take your pain medication only as prescribed.  Pain medication can cause sedation and decrease your breathing if you take more than you need for the level of pain that you have. 7. Nausea is a common side effect of many pain medications.  You may want to eat something before taking your pain medicine to prevent nausea. 8. After an Interscalene nerve block, you cannot feel pain, pressure or extremes in temperature in the effected arm.  Because your arm is numb it is at an increased risk for injury.  To decrease the possibility of injury, please practice the following:  a. While you are awake change the position of  your arm frequently to prevent too much pressure on any one area for prolonged periods of time. b.  If you have a cast or tight dressing, check the color or your fingers every couple of hours.  Call your surgeon with the appearance of any discoloration (white or blue). c. If you are given a sling to wear before you go home, please wear it  at all times until the block has completely worn off.  Do not get up at night without your sling. d. Please contact [facility] or your surgeon if you do not  begin to regain sensation after 7 days from the surgery.  Anesthesia may be contacted by calling the Same Day Surgery Department, Mon. through Fri., 6 am to 4 pm at [phone].   e. If you experience any other problems or concerns, please contact your surgeon's office. f. If you experience severe or prolonged shortness of breath go to the nearest emergency department. 

## 2019-07-16 NOTE — Anesthesia Postprocedure Evaluation (Signed)
Anesthesia Post Note  Patient: KEEGAN DUCEY  Procedure(s) Performed: Right shoulder arthroscopy with debridement, decompression, possible rotator cuff repair, and probable biceps tenodesis (Right Shoulder)  Patient location during evaluation: PACU Anesthesia Type: General Level of consciousness: awake and alert Pain management: pain level controlled Vital Signs Assessment: post-procedure vital signs reviewed and stable Respiratory status: spontaneous breathing, nonlabored ventilation, respiratory function stable and patient connected to nasal cannula oxygen Cardiovascular status: blood pressure returned to baseline and stable Postop Assessment: no apparent nausea or vomiting Anesthetic complications: no   No complications documented.   Last Vitals:  Vitals:   07/16/19 1124 07/16/19 1228  BP: (!) 158/97 136/81  Pulse: 63 65  Resp: 20 18  Temp: (!) 36.4 C   SpO2: 93% 93%    Last Pain:  Vitals:   07/16/19 1124  TempSrc: Temporal  PainSc: 0-No pain                 Lenard Simmer

## 2019-07-17 ENCOUNTER — Encounter: Payer: Self-pay | Admitting: Surgery

## 2019-09-08 ENCOUNTER — Other Ambulatory Visit: Payer: Self-pay | Admitting: Surgery

## 2019-09-17 ENCOUNTER — Other Ambulatory Visit: Payer: Self-pay

## 2019-09-17 ENCOUNTER — Encounter
Admission: RE | Admit: 2019-09-17 | Discharge: 2019-09-17 | Disposition: A | Discharge status: Home / Self Care | Payer: BC Managed Care – PPO | Source: Ambulatory Visit | Attending: Surgery | Admitting: Surgery

## 2019-09-17 DIAGNOSIS — Z01812 Encounter for preprocedural laboratory examination: Secondary | ICD-10-CM | POA: Diagnosis present

## 2019-09-17 NOTE — Patient Instructions (Signed)
Your procedure is scheduled on: Wed. 9/1 Report to Day Surgery. To find out your arrival time please call [phone] between 1PM - 3PM on Tues 8/31.  Remember: Instructions that are not followed completely may result in serious medical risk,  up to and including death, or upon the discretion of your surgeon and anesthesiologist your  surgery may need to be rescheduled.     _X__ 1. Do not eat food after midnight the night before your procedure.                 No chewing gum or hard candies. You may drink clear liquids up to 2 hours                 before you are scheduled to arrive for your surgery- DO not drink clear                 liquids within 2 hours of the start of your surgery.                 Clear Liquids include:  water, apple juice without pulp, clear Gatorade, G2 or                  Gatorade Zero (avoid Red/Purple/Blue), Black Coffee or Tea (Do not add                 anything to coffee or tea). ___x__2.   Complete G2  provided to you, 2 hours before arrival.   __X__2.  On the morning of surgery brush your teeth with toothpaste and water, you                may rinse your mouth with mouthwash if you wish.  Do not swallow any toothpaste of mouthwash.     _X__ 3.  No Alcohol for 24 hours before or after surgery.   _X__ 4.  Do Not Smoke or use e-cigarettes For 24 Hours Prior to Your Surgery.                 Do not use any chewable tobacco products for at least 6 hours prior to                 Surgery.  _X__  5.  Do not use any recreational drugs (marijuana, cocaine, heroin, ecstasy, MDMA or other)                For at least one week prior to your surgery.  Combination of these drugs with anesthesia                May have life threatening results.  ____  6.  Bring all medications with you on the day of surgery if instructed.   _x___  7.  Notify your doctor if there is any change in your medical condition      (cold, fever, infections).     Do  not wear jewelry, Do not wear lotions, . You may wear deodorant. Do not shave 48 hours prior to surgery. Men may shave face and neck. Do not bring valuables to the hospital.    [facility] is not responsible for any belongings or valuables.  Contacts, dentures or bridgework may not be worn into surgery. Leave your suitcase in the car. After surgery it may be brought to your room. For patients admitted to the hospital, discharge time is determined by your treatment team.   Patients discharged the day of  surgery will not be allowed to drive home.   Make arrangements for someone to be with you for the first 24 hours of your Same Day Discharge.    Please read over the following fact sheets that you were given:    _x__ Take these medicines the morning of surgery with A SIP OF WATER:    1. levothyroxine (SYNTHROID, LEVOTHROID) 125 MCG tablet  2. esomeprazole (NEXIUM) 20 MG capsule  Dose the night before and the morning of the surgery  3. metoprolol succinate (TOPROL-XL) 50 MG 24 hr tablet  4. May take nasal spray or allergy med if needed  5.  6.  ____ Fleet Enema (as directed)   __x__ Use CHG Soap (or wipes) as directed  ____ Use Benzoyl Peroxide Gel as instructed  ____ Use inhalers on the day of surgery  __x__ Stop metformin 2 days prior to surgery Last dose 8/29   ____ Take 1/2 of usual insulin dose the night before surgery. No insulin the morning          of surgery.   __x__ Stopped  aspirin  already  __x__ Stop Anti-inflammatories on today.  No ibuprofen aleve or aspirin products.  May take tylenol   __x__ Stop supplements until after surgery.  co-enzyme Q-10 50 MG capsule,Krill Oil 1000 MG CAPS  ____ Bring C-Pap to the hospital. ( no need to bring)   If you have any questions regarding your pre-procedure instructions,  Please call Pre-admit Testing at [phone]

## 2019-09-21 ENCOUNTER — Other Ambulatory Visit
Admission: RE | Admit: 2019-09-21 | Discharge: 2019-09-21 | Disposition: A | Discharge status: Home / Self Care | Payer: BC Managed Care – PPO | Source: Ambulatory Visit | Attending: Surgery | Admitting: Surgery

## 2019-09-21 ENCOUNTER — Other Ambulatory Visit: Payer: Self-pay

## 2019-09-21 DIAGNOSIS — Z20822 Contact with and (suspected) exposure to covid-19: Secondary | ICD-10-CM | POA: Insufficient documentation

## 2019-09-21 DIAGNOSIS — Z01812 Encounter for preprocedural laboratory examination: Secondary | ICD-10-CM | POA: Insufficient documentation

## 2019-09-21 LAB — SARS CORONAVIRUS 2 (TAT 6-24 HRS): SARS Coronavirus 2: NEGATIVE

## 2019-09-22 MED ORDER — SODIUM CHLORIDE 0.9 % IV SOLN: INTRAVENOUS | Status: DC

## 2019-09-23 ENCOUNTER — Encounter
Admission: RE | Disposition: A | Discharge status: Home / Self Care | Payer: Self-pay | Source: Home / Self Care | Attending: Surgery

## 2019-09-23 ENCOUNTER — Ambulatory Visit: Payer: BC Managed Care – PPO | Admitting: Anesthesiology

## 2019-09-23 ENCOUNTER — Encounter: Payer: Self-pay | Admitting: Surgery

## 2019-09-23 ENCOUNTER — Other Ambulatory Visit: Payer: Self-pay

## 2019-09-23 ENCOUNTER — Ambulatory Visit
Admission: RE | Admit: 2019-09-23 | Discharge: 2019-09-23 | Disposition: A | Discharge status: Home / Self Care | Payer: BC Managed Care – PPO | Attending: Surgery | Admitting: Surgery

## 2019-09-23 DIAGNOSIS — K219 Gastro-esophageal reflux disease without esophagitis: Secondary | ICD-10-CM | POA: Insufficient documentation

## 2019-09-23 DIAGNOSIS — G4733 Obstructive sleep apnea (adult) (pediatric): Secondary | ICD-10-CM | POA: Insufficient documentation

## 2019-09-23 DIAGNOSIS — I10 Essential (primary) hypertension: Secondary | ICD-10-CM | POA: Insufficient documentation

## 2019-09-23 DIAGNOSIS — N289 Disorder of kidney and ureter, unspecified: Secondary | ICD-10-CM | POA: Diagnosis not present

## 2019-09-23 DIAGNOSIS — E890 Postprocedural hypothyroidism: Secondary | ICD-10-CM | POA: Diagnosis not present

## 2019-09-23 DIAGNOSIS — M67441 Ganglion, right hand: Secondary | ICD-10-CM | POA: Diagnosis not present

## 2019-09-23 DIAGNOSIS — Z79899 Other long term (current) drug therapy: Secondary | ICD-10-CM | POA: Insufficient documentation

## 2019-09-23 DIAGNOSIS — M65331 Trigger finger, right middle finger: Secondary | ICD-10-CM | POA: Diagnosis not present

## 2019-09-23 DIAGNOSIS — E785 Hyperlipidemia, unspecified: Secondary | ICD-10-CM | POA: Insufficient documentation

## 2019-09-23 DIAGNOSIS — Z7982 Long term (current) use of aspirin: Secondary | ICD-10-CM | POA: Insufficient documentation

## 2019-09-23 DIAGNOSIS — Z7984 Long term (current) use of oral hypoglycemic drugs: Secondary | ICD-10-CM | POA: Insufficient documentation

## 2019-09-23 DIAGNOSIS — E119 Type 2 diabetes mellitus without complications: Secondary | ICD-10-CM | POA: Diagnosis not present

## 2019-09-23 DIAGNOSIS — Z86018 Personal history of other benign neoplasm: Secondary | ICD-10-CM | POA: Insufficient documentation

## 2019-09-23 DIAGNOSIS — Z79890 Hormone replacement therapy (postmenopausal): Secondary | ICD-10-CM | POA: Insufficient documentation

## 2019-09-23 HISTORY — PX: GANGLION CYST EXCISION: SHX1691

## 2019-09-23 LAB — GLUCOSE, CAPILLARY
Glucose-Capillary: 118 mg/dL — ABNORMAL HIGH (ref 70–99)
Glucose-Capillary: 105 mg/dL — ABNORMAL HIGH (ref 70–99)

## 2019-09-23 SURGERY — EXCISION, GANGLION CYST, WRIST
Anesthesia: General | Site: Wrist | Laterality: Right

## 2019-09-23 MED ORDER — FENTANYL CITRATE (PF) 100 MCG/2ML IJ SOLN
INTRAMUSCULAR | Status: DC | PRN
Start: 2019-09-23 — End: 2019-09-23
  Administered 2019-09-23: 50 ug via INTRAVENOUS

## 2019-09-23 MED ORDER — CEFAZOLIN SODIUM-DEXTROSE 2-4 GM/100ML-% IV SOLN
2.0000 g | INTRAVENOUS | Status: AC
  Administered 2019-09-23: 2 g via INTRAVENOUS

## 2019-09-23 MED ORDER — ONDANSETRON HCL 4 MG/2ML IJ SOLN
INTRAMUSCULAR | Status: DC | PRN
  Administered 2019-09-23: 4 mg via INTRAVENOUS

## 2019-09-23 MED ORDER — LIDOCAINE HCL (CARDIAC) PF 100 MG/5ML IV SOSY
PREFILLED_SYRINGE | INTRAVENOUS | Status: DC | PRN
  Administered 2019-09-23: 100 mg via INTRAVENOUS

## 2019-09-23 MED ORDER — FENTANYL CITRATE (PF) 100 MCG/2ML IJ SOLN
INTRAMUSCULAR | Status: AC
  Filled 2019-09-23: qty 2

## 2019-09-23 MED ORDER — ORAL CARE MOUTH RINSE: 15.0000 mL | Freq: Once | OROMUCOSAL | Status: AC

## 2019-09-23 MED ORDER — ACETAMINOPHEN 10 MG/ML IV SOLN
INTRAVENOUS | Status: DC | PRN
  Administered 2019-09-23: 1000 mg via INTRAVENOUS

## 2019-09-23 MED ORDER — PROPOFOL 10 MG/ML IV BOLUS
INTRAVENOUS | Status: AC
  Filled 2019-09-23: qty 20

## 2019-09-23 MED ORDER — CEFAZOLIN SODIUM-DEXTROSE 2-4 GM/100ML-% IV SOLN
INTRAVENOUS | Status: AC
  Filled 2019-09-23: qty 100

## 2019-09-23 MED ORDER — CHLORHEXIDINE GLUCONATE 0.12 % MT SOLN: 15.0000 mL | Freq: Once | OROMUCOSAL | Status: AC

## 2019-09-23 MED ORDER — ONDANSETRON HCL 4 MG/2ML IJ SOLN: 4.0000 mg | Freq: Once | INTRAMUSCULAR | Status: DC | PRN

## 2019-09-23 MED ORDER — EPHEDRINE SULFATE 50 MG/ML IJ SOLN
INTRAMUSCULAR | Status: DC | PRN
  Administered 2019-09-23: 5 mg via INTRAVENOUS
  Administered 2019-09-23: 10 mg via INTRAVENOUS
  Administered 2019-09-23: 7.5 mg via INTRAVENOUS

## 2019-09-23 MED ORDER — BUPIVACAINE HCL (PF) 0.5 % IJ SOLN
INTRAMUSCULAR | Status: DC | PRN
  Administered 2019-09-23: 10 mL

## 2019-09-23 MED ORDER — PROPOFOL 10 MG/ML IV BOLUS
INTRAVENOUS | Status: DC | PRN
  Administered 2019-09-23: 170 mg via INTRAVENOUS

## 2019-09-23 MED ORDER — CHLORHEXIDINE GLUCONATE 0.12 % MT SOLN
OROMUCOSAL | Status: AC
  Administered 2019-09-23: 15 mL via OROMUCOSAL
  Filled 2019-09-23: qty 15

## 2019-09-23 MED ORDER — LACTATED RINGERS IV SOLN: INTRAVENOUS | Status: DC

## 2019-09-23 MED ORDER — ACETAMINOPHEN 10 MG/ML IV SOLN
INTRAVENOUS | Status: AC
  Filled 2019-09-23: qty 100

## 2019-09-23 MED ORDER — FENTANYL CITRATE (PF) 100 MCG/2ML IJ SOLN: 25.0000 ug | INTRAMUSCULAR | Status: DC | PRN

## 2019-09-23 SURGICAL SUPPLY — 30 items
APL PRP STRL LF DISP 70% ISPRP (MISCELLANEOUS) ×2
BNDG COHESIVE 4X5 TAN STRL (GAUZE/BANDAGES/DRESSINGS) ×3 IMPLANT
BNDG ESMARK 4X12 TAN STRL LF (GAUZE/BANDAGES/DRESSINGS) ×3 IMPLANT
CANISTER SUCT 1200ML W/VALVE (MISCELLANEOUS) ×3 IMPLANT
CHLORAPREP W/TINT 26 (MISCELLANEOUS) ×6 IMPLANT
CORD BIP STRL DISP 12FT (MISCELLANEOUS) ×3 IMPLANT
COVER WAND RF STERILE (DRAPES) ×3 IMPLANT
CUFF TOURN SGL QUICK 18X4 (TOURNIQUET CUFF) IMPLANT
CUFF TOURN SGL QUICK 24 (TOURNIQUET CUFF) ×3
CUFF TRNQT CYL 24X4X16.5-23 (TOURNIQUET CUFF) ×1 IMPLANT
FORCEPS JEWEL BIP 4-3/4 STR (INSTRUMENTS) ×3 IMPLANT
GAUZE SPONGE 4X4 12PLY STRL (GAUZE/BANDAGES/DRESSINGS) ×3 IMPLANT
GAUZE XEROFORM 1X8 LF (GAUZE/BANDAGES/DRESSINGS) ×3 IMPLANT
GLOVE BIO SURGEON STRL SZ8 (GLOVE) ×6 IMPLANT
GLOVE INDICATOR 8.0 STRL GRN (GLOVE) ×3 IMPLANT
GOWN STRL REUS W/ TWL LRG LVL3 (GOWN DISPOSABLE) ×1 IMPLANT
GOWN STRL REUS W/ TWL XL LVL3 (GOWN DISPOSABLE) ×1 IMPLANT
GOWN STRL REUS W/TWL LRG LVL3 (GOWN DISPOSABLE) ×3
GOWN STRL REUS W/TWL XL LVL3 (GOWN DISPOSABLE) ×3
KIT TURNOVER KIT A (KITS) ×3 IMPLANT
NEEDLE HYPO 25X1 1.5 SAFETY (NEEDLE) ×3 IMPLANT
NS IRRIG 500ML POUR BTL (IV SOLUTION) ×3 IMPLANT
PACK EXTREMITY (MISCELLANEOUS) ×3 IMPLANT
PAD CAST CTTN 4X4 STRL (SOFTGOODS) ×1 IMPLANT
PADDING CAST COTTON 4X4 STRL (SOFTGOODS) ×3
SPLINT WRIST LG LT TX990309 (SOFTGOODS) ×3 IMPLANT
SPLINT WRIST M LT TX990308 (SOFTGOODS) ×3 IMPLANT
SPLINT WRIST M RT TX990303 (SOFTGOODS) ×3 IMPLANT
STOCKINETTE IMPERVIOUS 9X36 MD (GAUZE/BANDAGES/DRESSINGS) ×3 IMPLANT
SUT PROLENE 4 0 PS 2 18 (SUTURE) ×3 IMPLANT

## 2019-09-23 NOTE — Op Note (Signed)
09/23/2019  12:07 PM  Patient:   Jacob Lawson  Pre-Op Diagnosis:   Seed ganglion right long finger flexor sheath.  Post-Op Diagnosis:   Same  Procedure:   Excision of the ganglion right long finger flexor sheath with release right long trigger finger.  Surgeon:   Maryagnes Amos, MD  Assistant:   None  Anesthesia:   General LMA  Findings:   As above.  Complications:   None  EBL:   1 cc  Fluids:   500 cc crystalloid  TT:   15 minutes at 250 mmHg  Drains:   None  Closure:   4-0 Prolene  Implants:   None  Brief Clinical Note:   The patient is a 64 year old male with a several month history of a painful small mass on the palmar aspect of his right hand over the long metacarpal head. These symptoms have progressed despite medications, activity modification, etc. The patient's history and examination were consistent with a seed ganglion of the right long finger. The patient presents at this time for an excision of the seed ganglion with a right long trigger finger release.  Procedure:   The patient was brought into the operating room and lain in the supine position. After adequate general laryngeal mask anesthesia was achieved, the right hand and upper extremity were prepped with ChloraPrep solution before being draped sterilely. Preoperative antibiotics were administered. A timeout was performed to verify the appropriate surgical site.   An approximately 1.5-2.0 cm incision was made over the volar aspect of the right long finger at the level of the metacarpal head centered over the flexor sheath. The incision was carried down through the subcutaneous tissues with care taken to identify and protect the digital neurovascular structures. The seed ganglion was identified in the proximal portion of the A1 pulley and excised.    Subsequently, the flexor sheath was entered just proximal to the A1 pulley. The sheath was released proximally for several centimeters under direct  visualization. Distally, a clamp was placed beneath the A1 pulley and used to release any adhesions. The clamp was repositioned so that one jaw was superficial to and the other jaw deep to the A1 pulley. The A1 pulley was incised on either side of the clamp to remove a 2 mm strip of tissue. Metzenbaum scissors were used to ensure complete release of the A1 pulley more distally. The underlying tendons were carefully inspected and found to be intact, although there was significant chronically thickened tenosynovitis amidst the tendons.   The wound was copiously irrigated with sterile saline solution before the wound was closed using 4-0 Prolene interrupted sutures. A total of 10 cc of 0.5% plain Sensorcaine was injected in and around the incision to help with postoperative analgesia before a sterile bulky dressing was applied to the hand. The patient was then awakened, extubated, and returned to the recovery room in satisfactory condition after tolerating the procedure well.

## 2019-09-23 NOTE — Anesthesia Postprocedure Evaluation (Signed)
Anesthesia Post Note  Patient: Jacob Lawson  Procedure(s) Performed: REMOVAL GANGLION OF FLEXOR SHEATH RIGHT LONG FINGER (Right Wrist)  Patient location during evaluation: PACU Anesthesia Type: General Level of consciousness: awake and alert Pain management: pain level controlled Vital Signs Assessment: post-procedure vital signs reviewed and stable Respiratory status: spontaneous breathing and respiratory function stable Cardiovascular status: stable Anesthetic complications: no   No complications documented.   Last Vitals:  Vitals:   09/23/19 1259 09/23/19 1317  BP: 104/62 112/74  Pulse: 65 61  Resp: 15 16  Temp: 36.7 C   SpO2: 97% 94%    Last Pain:  Vitals:   09/23/19 1317  TempSrc:   PainSc: 0-No pain                 Tanashia Ciesla K

## 2019-09-23 NOTE — Discharge Instructions (Addendum)
Orthopedic discharge instructions: Keep dressing dry and intact. Keep hand elevated above heart level. May shower after dressing removed on postop day 4 (Sunday). Cover sutures with Band-Aids after drying off. Apply ice to affected area frequently. Take ibuprofen 600-800 mg TID with meals for 7-10 days, then as necessary. Take ES Tylenol or pain medication as prescribed when needed.  Return for follow-up in 10-14 days or as scheduled.  AMBULATORY SURGERY  DISCHARGE INSTRUCTIONS   1) The drugs that you were given will stay in your system until tomorrow so for the next 24 hours you should not:  A) Drive an automobile B) Make any legal decisions C) Drink any alcoholic beverage   2) You may resume regular meals tomorrow.  Today it is better to start with liquids and gradually work up to solid foods.  You may eat anything you prefer, but it is better to start with liquids, then soup and crackers, and gradually work up to solid foods.   3) Please notify your doctor immediately if you have any unusual bleeding, trouble breathing, redness and pain at the surgery site, drainage, fever, or pain not relieved by medication.    4) Additional Instructions:  Please contact your physician with any problems or Same Day Surgery at [phone], Monday through Friday 6 am to 4 pm, or [place] [facility] Main number at [phone]. 

## 2019-09-23 NOTE — Anesthesia Procedure Notes (Signed)
Procedure Name: LMA Insertion Performed by: Kayshaun Polanco, Anne Ng, CRNA Pre-anesthesia Checklist: Patient identified, Emergency Drugs available, Suction available and Patient being monitored Patient Re-evaluated:Patient Re-evaluated prior to induction Oxygen Delivery Method: Circle system utilized Preoxygenation: Pre-oxygenation with 100% oxygen Induction Type: IV induction Ventilation: Mask ventilation without difficulty LMA: LMA inserted LMA Size: 4.0 Tube type: Oral Number of attempts: 1 Placement Confirmation: positive ETCO2,  breath sounds checked- equal and bilateral and CO2 detector Tube secured with: Tape Dental Injury: Teeth and Oropharynx as per pre-operative assessment

## 2019-09-23 NOTE — H&P (Signed)
History of Present Illness: Jacob Lawson is a 64 y.o. male who presents for evaluation and treatment of a small painful mass in the palmar aspect of his right hand. He notes that this mass is quite uncomfortable with any direct pressure applied to the area. He denies any injury to the hand, and denies any numbness or paresthesias to his fingers.  Current Outpatient Medications:  aspirin 81 MG chewable tablet Take 81 mg by mouth once daily.   cholecalciferol (VITAMIN D3) 1000 unit capsule Take 1,000 Units by mouth 2 (two) times daily   cyanocobalamin (VITAMIN B12) 500 MCG tablet Take 500 mcg by mouth once daily.   esomeprazole (NEXIUM) 20 MG DR capsule TAKE 1 CAPSULE (20 MG TOTAL) BY MOUTH ONCE DAILY. OR EVERY OTHER DAY FOR REFLUX (Patient taking differently: Take 20 mg by mouth once daily as needed around every 3-4 days day for reflux ) 30 capsule 0   finasteride (PROSCAR) 5 mg tablet Take 5 mg by mouth once daily. 3   fluticasone (FLONASE) 50 mcg/actuation nasal spray Place 1-2 sprays into both nostrils once daily as needed for Allergies 16 g prn   folic acid/multivit-min/lutein (CENTRUM SILVER ORAL) Take 1 tablet by mouth once daily   krill-om3-dha-epa-om6-lip-astx (KRILL OIL, OMEGA 3 AND 6,) 1000-130(40-80) mg Cap Take 1 capsule by mouth 2 (two) times daily   levothyroxine (SYNTHROID) 125 MCG tablet Take 1 tablet (125 mcg total) by mouth every morning For thyroid 90 tablet 3   lisinopriL (ZESTRIL) 40 MG tablet Take 1 tablet (40 mg total) by mouth once daily For blood pressure 90 tablet 3   loratadine (CLARITIN) 10 mg tablet Take 10 mg by mouth once daily as needed for Allergies.   metFORMIN (GLUCOPHAGE-XR) 500 MG XR tablet Take 2 tablets (1,000 mg total) by mouth daily with breakfast 180 tablet 3   metoprolol succinate (TOPROL-XL) 50 MG XL tablet Take 1 tablet (50 mg total) by mouth once daily For blood pressure 90 tablet 3   oxyCODONE (ROXICODONE) 5 MG immediate release tablet Take  1 tablet (5 mg total) by mouth every 4 (four) hours as needed for Pain 42 tablet 0   sertraline (ZOLOFT) 50 MG tablet Take 1 tablet (50 mg total) by mouth once daily 90 tablet 3   testosterone (ANDROGEL) 1.25 gram/actuation topical gel 1 application transdermally daily (Patient taking differently: once daily. )   UBIDECARENONE (COENZYME Q10) 50 mg Tab Take 50 mg by mouth once daily.   No current Epic-ordered facility-administered medications on file.   Allergies: No Active Allergies  Past Medical History:   Allergic rhinitis   Aortic atherosclerosis (CMS-HCC) 11/14/2017  3.0 x 3.0 cm aneurysm on 08/07/2017 [facility] screening; no aneurysm on 11/14/2017 CT, but did show atherosclerosis   Bacteremia due to Gram-negative bacteria 11/14/2018  Due to infected puncture wound of left foot; admitted at [facility] 10/16 to 11/10/2018 and treated with I&D, IV Unasyn x 10 days   Carpal tunnel syndrome, bilateral  S/P R CT release   Chronic low back pain   Chronic urticaria   Colloid thyroid nodule 07/11/2016  Left thyroid nodule, biopsied by Dr. Elenore Rota; folow up annually   Depression  Mild   Diabetes mellitus type 2, uncomplicated (CMS-HCC)   Ectatic abdominal aorta (CMS-HCC) 11/14/2017  On 11/14/2017 CT; radiologist advised ultrasound in 2024   Essential hypertension, benign 08/13/2013   Fatty liver 02/20/2017  Suggested by appearance on 02/19/2017 ultrasound   GERD (gastroesophageal reflux disease)  with gastritis on 10/2008  EGD   Hyperlipidemia   Hypogonadism in male  S/P Testim implant 03/2012   Hypothyroidism   Migraine headache   Occipital neuralgia  Dr. Sherryll Burger   Sleep apnea  OSA on CPAP   Vitiligo   Past Surgical History:   APPENDECTOMY   ARTHROSCOPIC ROTATOR CUFF REPAIR Left 02/2005   BIOPSY PROSTATE NEEDLE/PUNCH 12/2015  Dr. Artis Flock-- normal result   COLONOSCOPY 03/19/1989  Normal Colon   COLONOSCOPY 07/04/1998  Adenomatous Polyp   COLONOSCOPY  02/04/2002  PH Adenomatous Polyp   COLONOSCOPY 01/21/2012, 10/28/2008, 08/19/2006  Adenomatous Polyps: [facility] 12/2016; Recall Ltr mailed 12/11/2016 (dw)   COLONOSCOPY 02/26/2017  Adenomatous Polyps: [facility] 02/2020   Congenital finger webbing surgery   EGD 10/28/2008, 08/19/2006, 02/04/2002, 07/04/1998, 01/24/1993, 03/19/1989   ENDOSCOPIC CARPAL TUNNEL RELEASE Right 11/2003   ENDOSCOPIC CARPAL TUNNEL RELEASE Left 11/19/2017  Dr. Elfredia Nevins   Excision of neuroma Left 06/2007  Left foot   Limited arthroscopic debridement, arthroscopic subacromial decompression, mini-open rotator cuff repair, and mini-open biceps tenodesis, right shoulder. Right 07/16/2019  Dr.Wael Maestas   Mandibular surgery in his late 30's  for overbite   Partial thyroidectomy for benign thyroid nodule   TONSILLECTOMY AND ADENOIDECTOMY   Family History:   Cancer Maternal Grandfather  oral cancer   Pancreatic cancer Father 50   Dementia Mother   Prostate cancer Brother   Bipolar disorder Other   Ovarian cancer Sister   Brain cancer Paternal Aunt   Social History:   Socioeconomic History   Marital status: Married  Spouse name: Not on file   Number of children: Not on file   Years of education: Not on file   Highest education level: Not on file  Occupational History   Not on file  Tobacco Use   Smoking status: Never Smoker   Smokeless tobacco: Never Used  Substance and Sexual Activity   Alcohol use: Yes  Alcohol/week: 0.0 standard drinks  Comment: Occasional   Drug use: Not on file   Sexual activity: Not on file  Other Topics Concern   Not on file  Social History Narrative   Not on file   Social Determinants of Health   Financial Resource Strain:   Difficulty of Paying Living Expenses:  Food Insecurity:   Worried About Programme researcher, broadcasting/film/video in the Last Year:   Barista in the Last Year:  Transportation Needs:   Freight forwarder (Medical):   Lack of  Transportation (Non-Medical):   Review of Systems:  A comprehensive 14 point ROS was performed, reviewed, and the pertinent orthopaedic findings are documented in the HPI.  Physical Exam: Vitals:  08/24/19 1007  BP: 134/82  Weight: (!) 118.7 kg (261 lb 9.6 oz)  Height: 177.8 cm (5\' 10" )  PainSc: 3  PainLoc: Shoulder   General/Constitutional: Pleasant overweight middle-aged male in no acute distress. Neuro/Psych: Normal mood and affect, oriented to person, place and time.  Eyes: Non-icteric. Pupils are equal, round, and reactive to light, and exhibit synchronous movement. ENT: Unremarkable. Lymphatic: No palpable adenopathy. Respiratory: Lungs clear to auscultation, Normal chest excursion, No wheezes and Non-labored breathing Cardiovascular: Regular rate and rhythm. No murmurs. and No edema, swelling or tenderness, except as noted in detailed exam. Integumentary: No impressive skin lesions present, except as noted in detailed exam. Musculoskeletal: Unremarkable, except as noted in detailed exam.  Right hand exam: Skin inspection of the right hand is unremarkable. No swelling, erythema, ecchymosis, abrasions, or other skin abnormalities are identified. There is a small  focal nodule palpable over the long metacarpal head, consistent with a seed ganglion. This mass is moderately tender to firm palpation. He exhibits full active and passive range of motion of all digits without any pain or triggering. He is neurovascularly intact to the right upper extremity and hand.  Assessment: Ganglion of flexor tendon sheath of right middle finger   Plan: The treatment options were discussed with the patient. In addition, patient educational materials were provided regarding the diagnosis and treatment options. Regarding his right hand mass, the patient is frustrated by its presence and is ready to consider more aggressive treatment options. Therefore, I have recommended a surgical procedure,  specifically an excision of the seed ganglion of the right long flexor sheath. The procedure was discussed with the patient, as were the potential risks (including bleeding, infection, nerve and/or blood vessel injury, persistent or recurrent pain, recurrence of the mass, need for further surgery, blood clots, strokes, heart attacks and/or arhythmias, pneumonia, etc.) and benefits. The patient states his/her understanding and wishes to proceed. All of the patient's questions and concerns were answered. He can call any time with further concerns. He will follow up post-surgery, routine.   H&P reviewed and patient re-examined. No changes.

## 2019-09-23 NOTE — Transfer of Care (Signed)
Immediate Anesthesia Transfer of Care Note  Patient: Jacob Lawson  Procedure(s) Performed: REMOVAL GANGLION OF FLEXOR SHEATH RIGHT LONG FINGER (Right Wrist)  Patient Location: PACU  Anesthesia Type:General  Level of Consciousness: sedated  Airway & Oxygen Therapy: Patient Spontanous Breathing and Patient connected to face mask oxygen  Post-op Assessment: Report given to RN and Post -op Vital signs reviewed and stable  Post vital signs: Reviewed and stable  Last Vitals:  Vitals Value Taken Time  BP 96/60 09/23/19 1214  Temp 36.9 C 09/23/19 1214  Pulse 62 09/23/19 1219  Resp 11 09/23/19 1219  SpO2 98 % 09/23/19 1219  Vitals shown include unvalidated device data.  Last Pain:  Vitals:   09/23/19 0952  TempSrc: Temporal  PainSc: 0-No pain         Complications: No complications documented.

## 2019-09-23 NOTE — Anesthesia Preprocedure Evaluation (Addendum)
Anesthesia Evaluation  Patient identified by MRN, date of birth, ID band Patient awake    Reviewed: Allergy & Precautions, NPO status , Patient's Chart, lab work & pertinent test results  History of Anesthesia Complications Negative for: history of anesthetic complications  Airway Mallampati: III       Dental   Pulmonary sleep apnea and Continuous Positive Airway Pressure Ventilation , neg COPD, Not current smoker,           Cardiovascular hypertension, Pt. on medications and Pt. on home beta blockers (-) Past MI and (-) CHF (-) dysrhythmias (-) Valvular Problems/Murmurs     Neuro/Psych neg Seizures Depression    GI/Hepatic GERD  Medicated and Controlled,  Endo/Other  diabetes, Type 2, Oral Hypoglycemic AgentsHypothyroidism   Renal/GU Renal InsufficiencyRenal disease     Musculoskeletal   Abdominal   Peds  Hematology   Anesthesia Other Findings   Reproductive/Obstetrics                            Anesthesia Physical Anesthesia Plan  ASA: III  Anesthesia Plan: General   Post-op Pain Management:    Induction: Intravenous  PONV Risk Score and Plan: 2 and Ondansetron and Dexamethasone  Airway Management Planned: LMA  Additional Equipment:   Intra-op Plan:   Post-operative Plan:   Informed Consent: I have reviewed the patients History and Physical, chart, labs and discussed the procedure including the risks, benefits and alternatives for the proposed anesthesia with the patient or authorized representative who has indicated his/her understanding and acceptance.       Plan Discussed with:   Anesthesia Plan Comments:         Anesthesia Quick Evaluation

## 2019-09-24 LAB — SURGICAL PATHOLOGY

## 2020-02-05 ENCOUNTER — Other Ambulatory Visit: Payer: Self-pay | Admitting: Student

## 2020-02-05 DIAGNOSIS — M1711 Unilateral primary osteoarthritis, right knee: Secondary | ICD-10-CM

## 2020-02-05 DIAGNOSIS — S83281A Other tear of lateral meniscus, current injury, right knee, initial encounter: Secondary | ICD-10-CM

## 2020-02-19 ENCOUNTER — Ambulatory Visit
Admission: RE | Admit: 2020-02-19 | Discharge: 2020-02-19 | Disposition: A | Discharge status: Home / Self Care | Payer: Medicare Other | Source: Ambulatory Visit | Attending: Student | Admitting: Student

## 2020-02-19 ENCOUNTER — Other Ambulatory Visit: Payer: Self-pay

## 2020-02-19 DIAGNOSIS — M1711 Unilateral primary osteoarthritis, right knee: Secondary | ICD-10-CM | POA: Diagnosis present

## 2020-02-19 DIAGNOSIS — S83281A Other tear of lateral meniscus, current injury, right knee, initial encounter: Secondary | ICD-10-CM | POA: Diagnosis present

## 2020-03-03 ENCOUNTER — Other Ambulatory Visit
Admission: RE | Admit: 2020-03-03 | Discharge: 2020-03-03 | Disposition: A | Discharge status: Home / Self Care | Payer: Medicare Other | Source: Ambulatory Visit | Attending: Gastroenterology | Admitting: Gastroenterology

## 2020-03-03 ENCOUNTER — Other Ambulatory Visit: Payer: Self-pay

## 2020-03-03 DIAGNOSIS — Z20822 Contact with and (suspected) exposure to covid-19: Secondary | ICD-10-CM | POA: Diagnosis not present

## 2020-03-03 DIAGNOSIS — Z01812 Encounter for preprocedural laboratory examination: Secondary | ICD-10-CM | POA: Diagnosis present

## 2020-03-03 LAB — SARS CORONAVIRUS 2 (TAT 6-24 HRS): SARS Coronavirus 2: NEGATIVE

## 2020-03-04 ENCOUNTER — Encounter: Payer: Self-pay | Admitting: *Deleted

## 2020-03-07 ENCOUNTER — Encounter: Payer: Self-pay | Admitting: *Deleted

## 2020-03-07 ENCOUNTER — Encounter
Admission: RE | Disposition: A | Discharge status: Home / Self Care | Payer: Self-pay | Source: Home / Self Care | Attending: Gastroenterology

## 2020-03-07 ENCOUNTER — Ambulatory Visit: Payer: Medicare Other | Admitting: Anesthesiology

## 2020-03-07 ENCOUNTER — Ambulatory Visit
Admission: RE | Admit: 2020-03-07 | Discharge: 2020-03-07 | Disposition: A | Discharge status: Home / Self Care | Payer: Medicare Other | Attending: Gastroenterology | Admitting: Gastroenterology

## 2020-03-07 ENCOUNTER — Other Ambulatory Visit: Payer: Self-pay

## 2020-03-07 DIAGNOSIS — K5730 Diverticulosis of large intestine without perforation or abscess without bleeding: Secondary | ICD-10-CM | POA: Insufficient documentation

## 2020-03-07 DIAGNOSIS — Z1211 Encounter for screening for malignant neoplasm of colon: Secondary | ICD-10-CM | POA: Diagnosis present

## 2020-03-07 DIAGNOSIS — Z7984 Long term (current) use of oral hypoglycemic drugs: Secondary | ICD-10-CM | POA: Insufficient documentation

## 2020-03-07 DIAGNOSIS — Z7982 Long term (current) use of aspirin: Secondary | ICD-10-CM | POA: Diagnosis not present

## 2020-03-07 DIAGNOSIS — K449 Diaphragmatic hernia without obstruction or gangrene: Secondary | ICD-10-CM | POA: Insufficient documentation

## 2020-03-07 DIAGNOSIS — K317 Polyp of stomach and duodenum: Secondary | ICD-10-CM | POA: Insufficient documentation

## 2020-03-07 DIAGNOSIS — K219 Gastro-esophageal reflux disease without esophagitis: Secondary | ICD-10-CM | POA: Insufficient documentation

## 2020-03-07 DIAGNOSIS — Z79899 Other long term (current) drug therapy: Secondary | ICD-10-CM | POA: Diagnosis not present

## 2020-03-07 DIAGNOSIS — Z86010 Personal history of colonic polyps: Secondary | ICD-10-CM | POA: Insufficient documentation

## 2020-03-07 DIAGNOSIS — Z888 Allergy status to other drugs, medicaments and biological substances status: Secondary | ICD-10-CM | POA: Diagnosis not present

## 2020-03-07 HISTORY — DX: Headache, unspecified: R51.9

## 2020-03-07 HISTORY — DX: Allergic rhinitis, unspecified: J30.9

## 2020-03-07 HISTORY — PX: COLONOSCOPY WITH PROPOFOL: SHX5780

## 2020-03-07 HISTORY — PX: ESOPHAGOGASTRODUODENOSCOPY: SHX5428

## 2020-03-07 SURGERY — COLONOSCOPY WITH PROPOFOL
Anesthesia: General

## 2020-03-07 MED ORDER — FENTANYL CITRATE (PF) 100 MCG/2ML IJ SOLN
INTRAMUSCULAR | Status: AC
Start: 2020-03-07 — End: ?
  Filled 2020-03-07: qty 2

## 2020-03-07 MED ORDER — MIDAZOLAM HCL 2 MG/2ML IJ SOLN
INTRAMUSCULAR | Status: AC
  Filled 2020-03-07: qty 2

## 2020-03-07 MED ORDER — PROPOFOL 500 MG/50ML IV EMUL
INTRAVENOUS | Status: AC
  Filled 2020-03-07: qty 50

## 2020-03-07 MED ORDER — FENTANYL CITRATE (PF) 100 MCG/2ML IJ SOLN
INTRAMUSCULAR | Status: DC | PRN
Start: 2020-03-07 — End: 2020-03-07
  Administered 2020-03-07 (×2): 25 ug via INTRAVENOUS
  Administered 2020-03-07: 50 ug via INTRAVENOUS

## 2020-03-07 MED ORDER — LABETALOL HCL 5 MG/ML IV SOLN
INTRAVENOUS | Status: AC
  Filled 2020-03-07: qty 4

## 2020-03-07 MED ORDER — MIDAZOLAM HCL 5 MG/5ML IJ SOLN
INTRAMUSCULAR | Status: DC | PRN
  Administered 2020-03-07 (×2): 2 mg via INTRAVENOUS

## 2020-03-07 MED ORDER — PROPOFOL 500 MG/50ML IV EMUL
INTRAVENOUS | Status: DC | PRN
  Administered 2020-03-07: 50 ug/kg/min via INTRAVENOUS

## 2020-03-07 MED ORDER — SODIUM CHLORIDE 0.9 % IV SOLN: INTRAVENOUS | Status: DC

## 2020-03-07 MED ORDER — LIDOCAINE HCL (PF) 2 % IJ SOLN
INTRAMUSCULAR | Status: DC | PRN
  Administered 2020-03-07: 80 mg

## 2020-03-07 MED ORDER — PROPOFOL 10 MG/ML IV BOLUS
INTRAVENOUS | Status: DC | PRN
  Administered 2020-03-07: 20 mg via INTRAVENOUS
  Administered 2020-03-07: 30 mg via INTRAVENOUS
  Administered 2020-03-07: 40 mg via INTRAVENOUS

## 2020-03-07 NOTE — Transfer of Care (Signed)
Immediate Anesthesia Transfer of Care Note  Patient: RAYDAN SCHLABACH  Procedure(s) Performed: COLONOSCOPY WITH PROPOFOL (N/A ) ESOPHAGOGASTRODUODENOSCOPY (EGD)  Patient Location: PACU  Anesthesia Type:General  Level of Consciousness: sedated  Airway & Oxygen Therapy: Patient Spontanous Breathing and Patient connected to nasal cannula oxygen  Post-op Assessment: Report given to RN and Post -op Vital signs reviewed and stable  Post vital signs: Reviewed and stable  Last Vitals:  Vitals Value Taken Time  BP 116/70 03/07/20 1310  Temp    Pulse 60 03/07/20 1311  Resp 12 03/07/20 1311  SpO2 96 % 03/07/20 1311  Vitals shown include unvalidated device data.  Last Pain:  Vitals:   03/07/20 1158  TempSrc: Temporal  PainSc: 0-No pain         Complications: No complications documented.

## 2020-03-07 NOTE — Interval H&P Note (Signed)
History and Physical Interval Note:  03/07/2020 12:29 PM  Jacob Lawson  has presented today for surgery, with the diagnosis of PH TA POLYPS; Gastroesophageal Reflux Disease.  The various methods of treatment have been discussed with the patient and family. After consideration of risks, benefits and other options for treatment, the patient has consented to  Procedure(s): COLONOSCOPY WITH PROPOFOL (N/A) ESOPHAGOGASTRODUODENOSCOPY (EGD) as a surgical intervention.  The patient's history has been reviewed, patient examined, no change in status, stable for surgery.  I have reviewed the patient's chart and labs.  Questions were answered to the patient's satisfaction.     Regis Bill  Ok to proceed with EGD/Colonoscopy

## 2020-03-07 NOTE — Op Note (Signed)
[facility] Patient Name: Jacob Lawson Procedure Date: 03/07/2020 11:36 AM MRN: [id number] Account #: [id number] Date of Birth: [birth year] Admit Type: Outpatient Age: 65 Room: [facility] ENDO ROOM 3 Gender: Male Note Status: Finalized Procedure:             Colonoscopy Indications:           High risk colon cancer surveillance: Personal history                         of multiple (3 or more) adenomas Providers:             Andrey Farmer MD, MD Referring MD:          Christena Flake. Raechel Ache, MD (Referring MD) Medicines:             Monitored Anesthesia Care Complications:         No immediate complications. Procedure:             Pre-Anesthesia Assessment:                        - Prior to the procedure, a History and Physical was                         performed, and patient medications and allergies were                         reviewed. The patient is competent. The risks and                         benefits of the procedure and the sedation options and                         risks were discussed with the patient. All questions                         were answered and informed consent was obtained.                         Patient identification and proposed procedure were                         verified by the physician, the nurse, the anesthetist                         and the technician in the endoscopy suite. Mental                         Status Examination: alert and oriented. Airway                         Examination: normal oropharyngeal airway and neck                         mobility. Respiratory Examination: clear to                         auscultation. CV Examination: normal. Prophylactic  Antibiotics: The patient does not require prophylactic                         antibiotics. Prior Anticoagulants: The patient has                         taken no previous anticoagulant or antiplatelet                          agents. ASA Grade Assessment: III - A patient with                         severe systemic disease. After reviewing the risks and                         benefits, the patient was deemed in satisfactory                         condition to undergo the procedure. The anesthesia                         plan was to use monitored anesthesia care (MAC).                         Immediately prior to administration of medications,                         the patient was re-assessed for adequacy to receive                         sedatives. The heart rate, respiratory rate, oxygen                         saturations, blood pressure, adequacy of pulmonary                         ventilation, and response to care were monitored                         throughout the procedure. The physical status of the                         patient was re-assessed after the procedure.                        After obtaining informed consent, the colonoscope was                         passed under direct vision. Throughout the procedure,                         the patient's blood pressure, pulse, and oxygen                         saturations were monitored continuously. The                         Colonoscope was introduced through the anus and  advanced to the the cecum, identified by appendiceal                         orifice and ileocecal valve. The colonoscopy was                         somewhat difficult due to significant looping.                         Successful completion of the procedure was aided by                         applying abdominal pressure. The patient tolerated the                         procedure well. The quality of the bowel preparation                         was good. Findings:      The perianal and digital rectal examinations were normal.      A single small-mouthed diverticulum was found in the sigmoid colon.      The exam was otherwise without abnormality  on direct and retroflexion       views. Impression:            - Diverticulosis in the sigmoid colon.                        - The examination was otherwise normal on direct and                         retroflexion views.                        - No specimens collected. Recommendation:        - Discharge patient to home.                        - Resume previous diet.                        - Continue present medications.                        - Repeat colonoscopy in 5 years for surveillance.                        - Return to referring physician as previously                         scheduled. Procedure Code(s):     --- Professional ---                        D4081, Colorectal cancer screening; colonoscopy on                         individual at high risk Diagnosis Code(s):     --- Professional ---                        Z86.010, Personal history of  colonic polyps                        K57.30, Diverticulosis of large intestine without                         perforation or abscess without bleeding CPT copyright 2019 American Medical Association. All rights reserved. The codes documented in this report are preliminary and upon coder review may  be revised to meet current compliance requirements. Andrey Farmer MD, MD 03/07/2020 1:15:11 PM Number of Addenda: 0 Note Initiated On: 03/07/2020 11:36 AM Scope Withdrawal Time: 0 hours 7 minutes 51 seconds  Total Procedure Duration: 0 hours 19 minutes 33 seconds  Estimated Blood Loss:  Estimated blood loss: none.      [facility]

## 2020-03-07 NOTE — Anesthesia Preprocedure Evaluation (Addendum)
Anesthesia Evaluation  Patient identified by MRN, date of birth, ID band Patient awake    Reviewed: Allergy & Precautions, NPO status , Patient's Chart, lab work & pertinent test results  History of Anesthesia Complications Negative for: history of anesthetic complications  Airway Mallampati: II  TM Distance: <3 FB Neck ROM: Full    Dental no notable dental hx. (+) Teeth Intact   Pulmonary sleep apnea and Continuous Positive Airway Pressure Ventilation , neg COPD, Patient abstained from smoking.Not current smoker,    Pulmonary exam normal breath sounds clear to auscultation       Cardiovascular Exercise Tolerance: Good METShypertension, (-) CAD and (-) Past MI (-) dysrhythmias  Rhythm:Regular Rate:Normal - Systolic murmurs 1. Left ventricular ejection fraction, by visual estimation, is 60 to  65%. The left ventricle has normal function. Left ventricular septal wall  thickness was normal. Normal left ventricular posterior wall thickness.  There is no left ventricular  hypertrophy.  2. Global right ventricle has normal systolic function.The right  ventricular size is normal. No increase in right ventricular wall  thickness.  3. Left atrial size was normal.  4. Right atrial size was normal.  5. The mitral valve is grossly normal. Mild mitral valve regurgitation.  6. The tricuspid valve is not well visualized. Tricuspid valve  regurgitation is trivial.  7. The aortic valve is grossly normal Aortic valve regurgitation is  trivial by color flow Doppler.  8. The pulmonic valve was not well visualized. Pulmonic valve  regurgitation is trivial by color flow Doppler.  9. The aortic root was not well visualized.  10. The atrial septum is grossly normal.    Neuro/Psych  Headaches, PSYCHIATRIC DISORDERS Depression    GI/Hepatic GERD  ,(+)     (-) substance abuse  ,   Endo/Other  diabetesHypothyroidism   Renal/GU ARFRenal  disease     Musculoskeletal   Abdominal   Peds  Hematology   Anesthesia Other Findings Past Medical History: No date: Allergic rhinitis No date: Carpal tunnel syndrome on both sides No date: Depression No date: Diabetes mellitus without complication (HCC) No date: GERD (gastroesophageal reflux disease) No date: Headache     Comment:  migraines No date: Hyperlipidemia No date: Hypertension No date: Hypothyroidism No date: Sleep apnea     Comment:  CPAP No date: Wears hearing aid in both ears  Reproductive/Obstetrics                            Anesthesia Physical Anesthesia Plan  ASA: II  Anesthesia Plan: General   Post-op Pain Management:    Induction: Intravenous  PONV Risk Score and Plan: 3 and Ondansetron, Propofol infusion and TIVA  Airway Management Planned: Nasal Cannula  Additional Equipment: None  Intra-op Plan:   Post-operative Plan:   Informed Consent: I have reviewed the patients History and Physical, chart, labs and discussed the procedure including the risks, benefits and alternatives for the proposed anesthesia with the patient or authorized representative who has indicated his/her understanding and acceptance.   Patient has DNR.  Suspend DNR.   Dental advisory given  Plan Discussed with: CRNA and Surgeon  Anesthesia Plan Comments: (Discussed risks of anesthesia with patient, including possibility of difficulty with spontaneous ventilation under anesthesia necessitating airway intervention, PONV, and rare risks such as cardiac or respiratory or neurological events. Patient understands.  Patient made it explicitly clear to me that he desires a DNR/DNI, and that he's an organ donor.  I explained to him the difference between perioperative cardiac events and out of hospital events, and patient understands, and agrees to be taken care of and resuscitated as need be periprocedurally. He advised me to use good judgement and not  to let him end up being a financial or medical burden for his family (brain dead, chronic vent).)        Anesthesia Quick Evaluation

## 2020-03-07 NOTE — Anesthesia Postprocedure Evaluation (Signed)
Anesthesia Post Note  Patient: Jacob Lawson  Procedure(s) Performed: COLONOSCOPY WITH PROPOFOL (N/A ) ESOPHAGOGASTRODUODENOSCOPY (EGD)  Patient location during evaluation: Endoscopy Anesthesia Type: General Level of consciousness: awake and alert Pain management: pain level controlled Vital Signs Assessment: post-procedure vital signs reviewed and stable Respiratory status: spontaneous breathing, nonlabored ventilation, respiratory function stable and patient connected to nasal cannula oxygen Cardiovascular status: blood pressure returned to baseline and stable Postop Assessment: no apparent nausea or vomiting Anesthetic complications: no   No complications documented.   Last Vitals:  Vitals:   03/07/20 1158 03/07/20 1310  BP: (!) 157/94   Pulse: 73   Resp: 18 16  Temp: 37.2 C 36.8 C  SpO2: 98%     Last Pain:  Vitals:   03/07/20 1310  TempSrc: Temporal  PainSc: Asleep                 Corinda Gubler

## 2020-03-07 NOTE — Op Note (Signed)
[facility] Gastroenterology Patient Name: Jacob Lawson Procedure Date: 03/07/2020 12:22 PM MRN: [id number] Account #: [id number] Date of Birth: 1955-10-21 Admit Type: Outpatient Age: 65 Room: [facility] ENDO ROOM 3 Gender: Male Note Status: Finalized Procedure:             Upper GI endoscopy Indications:           Gastro-esophageal reflux disease Providers:             Andrey Farmer MD, MD Referring MD:          Christena Flake. Raechel Ache, MD (Referring MD) Medicines:             Monitored Anesthesia Care Complications:         No immediate complications. Estimated blood loss:                         Minimal. Procedure:             Pre-Anesthesia Assessment:                        - Prior to the procedure, a History and Physical was                         performed, and patient medications and allergies were                         reviewed. The patient is competent. The risks and                         benefits of the procedure and the sedation options and                         risks were discussed with the patient. All questions                         were answered and informed consent was obtained.                         Patient identification and proposed procedure were                         verified by the physician, the nurse, the anesthetist                         and the technician in the endoscopy suite. Mental                         Status Examination: alert and oriented. Airway                         Examination: normal oropharyngeal airway and neck                         mobility. Respiratory Examination: clear to                         auscultation. CV Examination: normal. Prophylactic  Antibiotics: The patient does not require prophylactic                         antibiotics. Prior Anticoagulants: The patient has                         taken no previous anticoagulant or antiplatelet                         agents. ASA Grade  Assessment: III - A patient with                         severe systemic disease. After reviewing the risks and                         benefits, the patient was deemed in satisfactory                         condition to undergo the procedure. The anesthesia                         plan was to use monitored anesthesia care (MAC).                         Immediately prior to administration of medications,                         the patient was re-assessed for adequacy to receive                         sedatives. The heart rate, respiratory rate, oxygen                         saturations, blood pressure, adequacy of pulmonary                         ventilation, and response to care were monitored                         throughout the procedure. The physical status of the                         patient was re-assessed after the procedure.                        After obtaining informed consent, the endoscope was                         passed under direct vision. Throughout the procedure,                         the patient's blood pressure, pulse, and oxygen                         saturations were monitored continuously. The Endoscope                         was introduced through the mouth, and advanced to the  second part of duodenum. The upper GI endoscopy was                         accomplished without difficulty. The patient tolerated                         the procedure well. Findings:      A hiatal hernia was found. The proximal extent of the gastric folds (end       of tubular esophagus) was 36 cm from the incisors. The hiatal narrowing       was 42 cm from the incisors.      The exam of the esophagus was otherwise normal. No evidence of Barrett's       Esophagus.      Multiple sessile polyps with no bleeding and no stigmata of recent       bleeding were found in the gastric fundus and in the gastric body.       Biopsies were taken with a cold  forceps for histology. Estimated blood       loss was minimal.      The exam of the stomach was otherwise normal.      The examined duodenum was normal. Impression:            - Hiatal hernia.                        - Multiple gastric polyps. Biopsied.                        - Normal examined duodenum. Recommendation:        - Discharge patient to home.                        - Resume previous diet.                        - Continue present medications.                        - Await pathology results.                        - Return to referring physician as previously                         scheduled. Procedure Code(s):     --- Professional ---                        [phone], Esophagogastroduodenoscopy, flexible,                         transoral; with biopsy, single or multiple Diagnosis Code(s):     --- Professional ---                        K44.9, Diaphragmatic hernia without obstruction or                         gangrene                        K31.7, Polyp of stomach and duodenum  K21.9, Gastro-esophageal reflux disease without                         esophagitis CPT copyright 2019 American Medical Association. All rights reserved. The codes documented in this report are preliminary and upon coder review may  be revised to meet current compliance requirements. Andrey Farmer MD, MD 03/07/2020 1:12:55 PM Number of Addenda: 0 Note Initiated On: 03/07/2020 12:22 PM Estimated Blood Loss:  Estimated blood loss was minimal.      [facility]

## 2020-03-07 NOTE — H&P (Signed)
Outpatient short stay form Pre-procedure 03/07/2020 12:27 PM Merlyn Lot MD, MPH  Primary Physician: Dr. Harrington Challenger  Reason for visit:  BE's and surveillance colonoscopy  History of present illness:   65 y/o gentleman with history of polyps and reported history of BE's here for EGD/Colonoscopy. No new GI symptoms besides occasional LLQ pain. No fevers/chills. No family history of GI malignancies. No blood thinners.    Current Facility-Administered Medications:  .  0.9 %  sodium chloride infusion, , Intravenous, Continuous, Locklear, Rossie Muskrat, MD, Last Rate: 20 mL/hr at 03/07/20 1218, New Bag at 03/07/20 1218  Medications Prior to Admission  Medication Sig Dispense Refill Last Dose  . aspirin 81 MG tablet Take 81 mg by mouth daily.   Past Week at Unknown time  . Cholecalciferol (VITAMIN D3) 2000 units TABS Take 2,000 Units by mouth daily.    Past Week at Unknown time  . co-enzyme Q-10 50 MG capsule Take 50 mg by mouth daily.   Past Week at Unknown time  . esomeprazole (NEXIUM) 20 MG capsule Take 20 mg by mouth daily before breakfast.    Past Week at Unknown time  . finasteride (PROSCAR) 5 MG tablet Take 5 mg by mouth every evening.    Past Week at Unknown time  . fluticasone (FLONASE) 50 MCG/ACT nasal spray Place 1-2 sprays into both nostrils daily as needed for allergies.    Past Week at Unknown time  . Krill Oil 1000 MG CAPS Take 3,000 mg by mouth every evening.    Past Week at Unknown time  . levothyroxine (SYNTHROID, LEVOTHROID) 125 MCG tablet Take 125 mcg by mouth daily before breakfast.   Past Week at Unknown time  . liothyronine (CYTOMEL) 25 MCG tablet Take by mouth daily.   Past Week at Unknown time  . lisinopril (ZESTRIL) 40 MG tablet Take 40 mg by mouth daily.    Past Week at Unknown time  . loratadine (CLARITIN) 10 MG tablet Take 10 mg by mouth daily as needed for allergies.   Past Week at Unknown time  . metFORMIN (GLUCOPHAGE-XR) 500 MG 24 hr tablet Take 1,000 mg by mouth daily  with breakfast.   Past Week at Unknown time  . metoprolol succinate (TOPROL-XL) 50 MG 24 hr tablet Take 50 mg by mouth daily. Take with or immediately following a meal.   Past Week at Unknown time  . Multiple Vitamins-Minerals (CENTRUM SILVER PO) Take 1 tablet by mouth daily.    Past Week at Unknown time  . sertraline (ZOLOFT) 50 MG tablet Take 50 mg by mouth every evening.    Past Week at Unknown time  . TESTOSTERONE TD Place 1.25 g onto the skin daily. (20% topical cream)   Past Week at Unknown time  . vitamin B-12 (CYANOCOBALAMIN) 500 MCG tablet Take 500 mcg by mouth daily.   Past Week at Unknown time     Allergies  Allergen Reactions  . Lamictal [Lamotrigine] Hives  . Pravastatin Other (See Comments)  . Simvastatin Other (See Comments)    Muscle aches  . Statins Other (See Comments)    Muscle/joint aches      Past Medical History:  Diagnosis Date  . Allergic rhinitis   . Carpal tunnel syndrome on both sides   . Depression   . Diabetes mellitus without complication (HCC)   . GERD (gastroesophageal reflux disease)   . Headache    migraines  . Hyperlipidemia   . Hypertension   . Hypothyroidism   .  Sleep apnea    CPAP  . Wears hearing aid in both ears     Review of systems:  Otherwise negative.    Physical Exam  Gen: Alert, oriented. Appears stated age.  HEENT: PERRLA. Lungs: No respiratory distress CV: RRR Abd: soft, benign, no masses Ext: No edema    Planned procedures: Proceed with EGD/colonoscopy. The patient understands the nature of the planned procedure, indications, risks, alternatives and potential complications including but not limited to bleeding, infection, perforation, damage to internal organs and possible oversedation/side effects from anesthesia. The patient agrees and gives consent to proceed.  Please refer to procedure notes for findings, recommendations and patient disposition/instructions.     Merlyn Lot MD,  [facility] 03/07/2020  12:27 PM

## 2020-03-08 ENCOUNTER — Other Ambulatory Visit: Payer: Self-pay | Admitting: Surgery

## 2020-03-08 LAB — SURGICAL PATHOLOGY

## 2020-03-15 ENCOUNTER — Other Ambulatory Visit: Payer: Self-pay

## 2020-03-15 ENCOUNTER — Other Ambulatory Visit
Admission: RE | Admit: 2020-03-15 | Discharge: 2020-03-15 | Disposition: A | Discharge status: Home / Self Care | Payer: Medicare Other | Source: Ambulatory Visit | Attending: Surgery | Admitting: Surgery

## 2020-03-15 HISTORY — DX: Atherosclerosis of aorta: I70.0

## 2020-03-15 NOTE — Patient Instructions (Signed)
Your procedure is scheduled on: Tuesday March 22, 2020. Report to Day Surgery inside [facility] 2nd floor (stop by Admissions desk first before getting on Elevator). To find out your arrival time please call [phone] between 1PM - 3PM on Monday March 21, 2020.  Remember: Instructions that are not followed completely may result in serious medical risk,  up to and including death, or upon the discretion of your surgeon and anesthesiologist your  surgery may need to be rescheduled.     _X__ 1. Do not eat food after midnight the night before your procedure.                 No chewing gum or hard candies. You may drink clear liquids up to 2 hours                 before you are scheduled to arrive for your surgery- DO not drink clear                 liquids within 2 hours of the start of your surgery.                 Clear Liquids include:  water, apple juice without pulp, clear Gatorade, G2 or                  Gatorade Zero (avoid Red/Purple/Blue), Black Coffee or Tea (Do not add                 anything to coffee or tea).  __X__2.   Complete the "Ensure Clear Pre-surgery Clear Carbohydrate Drink" provided to you, 2 hours before arrival.  __X__3.  On the morning of surgery brush your teeth with toothpaste and water, you                may rinse your mouth with mouthwash if you wish.  Do not swallow any toothpaste of mouthwash.     _X__ 4.  No Alcohol for 24 hours before or after surgery.   _X__ 5.  Do Not Smoke or use e-cigarettes For 24 Hours Prior to Your Surgery.                 Do not use any chewable tobacco products for at least 6 hours prior to                 Surgery.  _X__  6.  Do not use any recreational drugs (marijuana, cocaine, heroin, ecstasy, MDMA or other)                For at least one week prior to your surgery.  Combination of these drugs with anesthesia                May have life threatening results.  __X_ 7.  Notify your doctor if  there is any change in your medical condition      (cold, fever, infections).     Do not wear jewelry, make-up, hairpins, clips or nail polish. Do not wear lotions, powders, or perfumes. You may wear deodorant. Do not shave 48 hours prior to surgery. Men may shave face and neck. Do not bring valuables to the hospital.    [facility] is not responsible for any belongings or valuables.  Contacts, dentures or bridgework may not be worn into surgery. Leave your suitcase in the car. After surgery it may be brought to your room. For patients admitted to the hospital, discharge time is  determined by your treatment team.   Patients discharged the day of surgery will not be allowed to drive home.   Make arrangements for someone to be with you for the first 24 hours of your Same Day Discharge.   __X__ Take these medicines the morning of surgery with A SIP OF WATER:    1. levothyroxine (SYNTHROID, LEVOTHROID) 125 MCG  2. liothyronine (CYTOMEL) 25 MCG   3. esomeprazole (NEXIUM) 20 MG   ____ Fleet Enema (as directed)   __X__ Use CHG Soap (or wipes) as directed  ____ Use Benzoyl Peroxide Gel as instructed  ____ Use inhalers on the day of surgery  __X__ Stop metFORMIN (GLUCOPHAGE-XR) 500  2 days prior to surgery    ____ Take 1/2 of usual insulin dose the night before surgery. No insulin the morning          of surgery.   __X__ Stop aspirin 81 MG as instructed.   __X__ Stop Anti-inflammatories ibuprofen, Aleve, Advil, naproxen, and or BC powders.   __X_ Stop supplements until after surgery. Like Vitamin C, vitamin E and or Fish oil.    __X__ Bring C-Pap to the hospital. If in good standing.   If you have any questions regarding your pre-procedure instructions,  Please call Pre-admit Testing at [phone].

## 2020-03-18 ENCOUNTER — Other Ambulatory Visit: Payer: Medicare Other

## 2020-03-18 ENCOUNTER — Encounter
Admission: RE | Admit: 2020-03-18 | Discharge: 2020-03-18 | Disposition: A | Discharge status: Home / Self Care | Payer: Medicare Other | Source: Ambulatory Visit | Attending: Surgery | Admitting: Surgery

## 2020-03-18 ENCOUNTER — Other Ambulatory Visit: Payer: Self-pay

## 2020-03-18 DIAGNOSIS — Z01818 Encounter for other preprocedural examination: Secondary | ICD-10-CM | POA: Diagnosis present

## 2020-03-18 DIAGNOSIS — Z20822 Contact with and (suspected) exposure to covid-19: Secondary | ICD-10-CM | POA: Insufficient documentation

## 2020-03-18 DIAGNOSIS — I10 Essential (primary) hypertension: Secondary | ICD-10-CM | POA: Insufficient documentation

## 2020-03-18 LAB — BASIC METABOLIC PANEL
Anion gap: 8 (ref 5–15)
BUN: 22 mg/dL (ref 8–23)
CO2: 28 mmol/L (ref 22–32)
Calcium: 9.5 mg/dL (ref 8.9–10.3)
Chloride: 101 mmol/L (ref 98–111)
Creatinine, Ser: 1.32 mg/dL — ABNORMAL HIGH (ref 0.61–1.24)
GFR, Estimated: 60 mL/min — ABNORMAL LOW (ref >60)
Glucose, Bld: 120 mg/dL — ABNORMAL HIGH (ref 70–99)
Potassium: 3.8 mmol/L (ref 3.5–5.1)
Sodium: 137 mmol/L (ref 135–145)

## 2020-03-18 LAB — CBC
HCT: 46.8 % (ref 39.0–52.0)
Hemoglobin: 15.8 g/dL (ref 13.0–17.0)
MCH: 27.9 pg (ref 26.0–34.0)
MCHC: 33.8 g/dL (ref 30.0–36.0)
MCV: 82.7 fL (ref 80.0–100.0)
Platelets: 180 10*3/uL (ref 150–400)
RBC: 5.66 MIL/uL (ref 4.22–5.81)
RDW: 17 % — ABNORMAL HIGH (ref 11.5–15.5)
WBC: 5.5 10*3/uL (ref 4.0–10.5)
nRBC: 0 % (ref 0.0–0.2)

## 2020-03-18 LAB — SARS CORONAVIRUS 2 (TAT 6-24 HRS): SARS Coronavirus 2: NEGATIVE

## 2020-03-21 MED ORDER — ORAL CARE MOUTH RINSE: 15.0000 mL | Freq: Once | OROMUCOSAL | Status: AC

## 2020-03-21 MED ORDER — SODIUM CHLORIDE 0.9 % IV SOLN: INTRAVENOUS | Status: DC

## 2020-03-21 MED ORDER — CHLORHEXIDINE GLUCONATE 0.12 % MT SOLN: 15.0000 mL | Freq: Once | OROMUCOSAL | Status: AC

## 2020-03-21 MED ORDER — CEFAZOLIN SODIUM-DEXTROSE 2-4 GM/100ML-% IV SOLN
2.0000 g | INTRAVENOUS | Status: AC
  Administered 2020-03-22: 2 g via INTRAVENOUS

## 2020-03-22 ENCOUNTER — Other Ambulatory Visit: Payer: Self-pay

## 2020-03-22 ENCOUNTER — Ambulatory Visit: Payer: Medicare Other | Admitting: Certified Registered"

## 2020-03-22 ENCOUNTER — Encounter: Payer: Self-pay | Admitting: Surgery

## 2020-03-22 ENCOUNTER — Encounter
Admission: RE | Disposition: A | Discharge status: Home / Self Care | Payer: Self-pay | Source: Home / Self Care | Attending: Surgery

## 2020-03-22 ENCOUNTER — Ambulatory Visit
Admission: RE | Admit: 2020-03-22 | Discharge: 2020-03-22 | Disposition: A | Discharge status: Home / Self Care | Payer: Medicare Other | Attending: Surgery | Admitting: Surgery

## 2020-03-22 DIAGNOSIS — M94261 Chondromalacia, right knee: Secondary | ICD-10-CM | POA: Insufficient documentation

## 2020-03-22 DIAGNOSIS — X58XXXA Exposure to other specified factors, initial encounter: Secondary | ICD-10-CM | POA: Diagnosis not present

## 2020-03-22 DIAGNOSIS — Z7984 Long term (current) use of oral hypoglycemic drugs: Secondary | ICD-10-CM | POA: Diagnosis not present

## 2020-03-22 DIAGNOSIS — Z6834 Body mass index (BMI) 34.0-34.9, adult: Secondary | ICD-10-CM | POA: Insufficient documentation

## 2020-03-22 DIAGNOSIS — Z79890 Hormone replacement therapy (postmenopausal): Secondary | ICD-10-CM | POA: Insufficient documentation

## 2020-03-22 DIAGNOSIS — S83231A Complex tear of medial meniscus, current injury, right knee, initial encounter: Secondary | ICD-10-CM | POA: Insufficient documentation

## 2020-03-22 DIAGNOSIS — Z79899 Other long term (current) drug therapy: Secondary | ICD-10-CM | POA: Diagnosis not present

## 2020-03-22 DIAGNOSIS — E669 Obesity, unspecified: Secondary | ICD-10-CM | POA: Insufficient documentation

## 2020-03-22 DIAGNOSIS — Z7982 Long term (current) use of aspirin: Secondary | ICD-10-CM | POA: Diagnosis not present

## 2020-03-22 HISTORY — PX: KNEE ARTHROSCOPY: SHX127

## 2020-03-22 HISTORY — PX: KNEE ARTHROSCOPY WITH MENISCAL REPAIR: SHX5653

## 2020-03-22 HISTORY — PX: CHONDROPLASTY: SHX5177

## 2020-03-22 LAB — GLUCOSE, CAPILLARY
Glucose-Capillary: 130 mg/dL — ABNORMAL HIGH (ref 70–99)
Glucose-Capillary: 117 mg/dL — ABNORMAL HIGH (ref 70–99)

## 2020-03-22 SURGERY — ARTHROSCOPY, KNEE
Anesthesia: General | Site: Knee | Laterality: Right

## 2020-03-22 MED ORDER — FENTANYL CITRATE (PF) 100 MCG/2ML IJ SOLN
INTRAMUSCULAR | Status: AC
  Filled 2020-03-22: qty 2

## 2020-03-22 MED ORDER — EPHEDRINE SULFATE 50 MG/ML IJ SOLN
INTRAMUSCULAR | Status: DC | PRN
  Administered 2020-03-22 (×2): 5 mg via INTRAVENOUS
  Administered 2020-03-22: 10 mg via INTRAVENOUS

## 2020-03-22 MED ORDER — FENTANYL CITRATE (PF) 100 MCG/2ML IJ SOLN: 25.0000 ug | INTRAMUSCULAR | Status: DC | PRN

## 2020-03-22 MED ORDER — ONDANSETRON HCL 4 MG PO TABS: 4.0000 mg | ORAL_TABLET | Freq: Four times a day (QID) | ORAL | Status: DC | PRN

## 2020-03-22 MED ORDER — ACETAMINOPHEN 10 MG/ML IV SOLN
INTRAVENOUS | Status: AC
  Filled 2020-03-22: qty 100

## 2020-03-22 MED ORDER — LIDOCAINE HCL (CARDIAC) PF 100 MG/5ML IV SOSY
PREFILLED_SYRINGE | INTRAVENOUS | Status: DC | PRN
  Administered 2020-03-22: 100 mg via INTRAVENOUS

## 2020-03-22 MED ORDER — HYDROCODONE-ACETAMINOPHEN 5-325 MG PO TABS: 1.0000 | ORAL_TABLET | ORAL | Status: DC | PRN

## 2020-03-22 MED ORDER — ACETAMINOPHEN 10 MG/ML IV SOLN
INTRAVENOUS | Status: DC | PRN
  Administered 2020-03-22: 1000 mg via INTRAVENOUS

## 2020-03-22 MED ORDER — CHLORHEXIDINE GLUCONATE 0.12 % MT SOLN
OROMUCOSAL | Status: AC
  Administered 2020-03-22: 15 mL via OROMUCOSAL
  Filled 2020-03-22: qty 15

## 2020-03-22 MED ORDER — LIDOCAINE HCL (PF) 1 % IJ SOLN
INTRAMUSCULAR | Status: AC
  Filled 2020-03-22: qty 30

## 2020-03-22 MED ORDER — CEFAZOLIN SODIUM-DEXTROSE 2-4 GM/100ML-% IV SOLN
INTRAVENOUS | Status: AC
  Filled 2020-03-22: qty 100

## 2020-03-22 MED ORDER — PHENYLEPHRINE HCL (PRESSORS) 10 MG/ML IV SOLN
INTRAVENOUS | Status: DC | PRN
  Administered 2020-03-22: 100 ug via INTRAVENOUS

## 2020-03-22 MED ORDER — PROPOFOL 10 MG/ML IV BOLUS
INTRAVENOUS | Status: AC
  Filled 2020-03-22: qty 20

## 2020-03-22 MED ORDER — PROPOFOL 10 MG/ML IV BOLUS
INTRAVENOUS | Status: DC | PRN
  Administered 2020-03-22: 200 mg via INTRAVENOUS

## 2020-03-22 MED ORDER — LIDOCAINE HCL 1 % IJ SOLN
INTRAMUSCULAR | Status: DC | PRN
  Administered 2020-03-22: 30 mL

## 2020-03-22 MED ORDER — BUPIVACAINE-EPINEPHRINE (PF) 0.5% -1:200000 IJ SOLN
INTRAMUSCULAR | Status: AC
  Filled 2020-03-22: qty 180

## 2020-03-22 MED ORDER — FENTANYL CITRATE (PF) 100 MCG/2ML IJ SOLN
INTRAMUSCULAR | Status: DC | PRN
  Administered 2020-03-22 (×2): 25 ug via INTRAVENOUS
  Administered 2020-03-22: 50 ug via INTRAVENOUS

## 2020-03-22 MED ORDER — HYDROCODONE-ACETAMINOPHEN 5-325 MG PO TABS: 1.0000 | ORAL_TABLET | Freq: Four times a day (QID) | ORAL | 0 refills | Status: DC | PRN

## 2020-03-22 MED ORDER — ONDANSETRON HCL 4 MG/2ML IJ SOLN
INTRAMUSCULAR | Status: DC | PRN
  Administered 2020-03-22: 4 mg via INTRAVENOUS

## 2020-03-22 MED ORDER — BUPIVACAINE-EPINEPHRINE (PF) 0.5% -1:200000 IJ SOLN
INTRAMUSCULAR | Status: DC | PRN
  Administered 2020-03-22: 20 mL
  Administered 2020-03-22: 30 mL

## 2020-03-22 MED ORDER — METOCLOPRAMIDE HCL 10 MG PO TABS: 5.0000 mg | ORAL_TABLET | Freq: Three times a day (TID) | ORAL | Status: DC | PRN

## 2020-03-22 MED ORDER — ONDANSETRON HCL 4 MG/2ML IJ SOLN: 4.0000 mg | Freq: Once | INTRAMUSCULAR | Status: DC | PRN

## 2020-03-22 MED ORDER — ONDANSETRON HCL 4 MG/2ML IJ SOLN: 4.0000 mg | Freq: Four times a day (QID) | INTRAMUSCULAR | Status: DC | PRN

## 2020-03-22 MED ORDER — GLYCOPYRROLATE 0.2 MG/ML IJ SOLN
INTRAMUSCULAR | Status: DC | PRN
  Administered 2020-03-22: .2 mg via INTRAVENOUS

## 2020-03-22 MED ORDER — METOCLOPRAMIDE HCL 5 MG/ML IJ SOLN: 5.0000 mg | Freq: Three times a day (TID) | INTRAMUSCULAR | Status: DC | PRN

## 2020-03-22 SURGICAL SUPPLY — 46 items
"PENCIL ELECTRO HAND CTR " (MISCELLANEOUS) ×1 IMPLANT
APL PRP STRL LF DISP 70% ISPRP (MISCELLANEOUS) ×1
BAG COUNTER SPONGE EZ (MISCELLANEOUS) IMPLANT
BAG SPNG 4X4 CLR HAZ (MISCELLANEOUS)
BLADE FULL RADIUS 3.5 (BLADE) ×2 IMPLANT
BLADE SHAVER 4.5X7 STR FR (MISCELLANEOUS) ×1 IMPLANT
BNDG ELASTIC 6X5.8 VLCR STR LF (GAUZE/BANDAGES/DRESSINGS) ×2 IMPLANT
CHLORAPREP W/TINT 26 (MISCELLANEOUS) ×2 IMPLANT
COVER WAND RF STERILE (DRAPES) ×2 IMPLANT
CUFF TOURN SGL QUICK 24 (TOURNIQUET CUFF)
CUFF TOURN SGL QUICK 30 (TOURNIQUET CUFF) ×2
CUFF TRNQT CYL 24X4X16.5-23 (TOURNIQUET CUFF) IMPLANT
CUFF TRNQT CYL 30X4X21-28X (TOURNIQUET CUFF) IMPLANT
DRAPE IMP U-DRAPE 54X76 (DRAPES) ×2 IMPLANT
ELECT REM PT RETURN 9FT ADLT (ELECTROSURGICAL) ×2
ELECTRODE REM PT RTRN 9FT ADLT (ELECTROSURGICAL) ×1 IMPLANT
FASTFIX NDL DEL SYS 360 CVD (Miscellaneous) ×6 IMPLANT
GAUZE SPONGE 4X4 12PLY STRL (GAUZE/BANDAGES/DRESSINGS) ×2 IMPLANT
GAUZE XEROFORM 1X8 LF (GAUZE/BANDAGES/DRESSINGS) ×1 IMPLANT
GLOVE BIOGEL M 7.0 STRL (GLOVE) ×4 IMPLANT
GLOVE BIOGEL PI IND STRL 7.5 (GLOVE) ×1 IMPLANT
GLOVE BIOGEL PI INDICATOR 7.5 (GLOVE) ×1
GLOVE INDICATOR 8.0 STRL GRN (GLOVE) ×2 IMPLANT
GLOVE SURG ENC MOIS LTX SZ8 (GLOVE) ×4 IMPLANT
GOWN STRL REUS W/ TWL LRG LVL3 (GOWN DISPOSABLE) ×1 IMPLANT
GOWN STRL REUS W/ TWL XL LVL3 (GOWN DISPOSABLE) ×2 IMPLANT
GOWN STRL REUS W/TWL LRG LVL3 (GOWN DISPOSABLE) ×2
GOWN STRL REUS W/TWL XL LVL3 (GOWN DISPOSABLE) ×4
IV LACTATED RINGER IRRG 3000ML (IV SOLUTION) ×2
IV LR IRRIG 3000ML ARTHROMATIC (IV SOLUTION) ×1 IMPLANT
KIT TURNOVER KIT A (KITS) ×2 IMPLANT
MANAGER SUT NOVOCUT (CUTTER) ×1 IMPLANT
MANIFOLD NEPTUNE II (INSTRUMENTS) ×3 IMPLANT
NDL HYPO 21X1.5 SAFETY (NEEDLE) ×1 IMPLANT
NEEDLE HYPO 21X1.5 SAFETY (NEEDLE) ×2 IMPLANT
NOVOSTICH PRO MENISCAL 2-0 (Miscellaneous) ×2 IMPLANT
PACK ARTHROSCOPY KNEE (MISCELLANEOUS) ×2 IMPLANT
PENCIL ELECTRO HAND CTR (MISCELLANEOUS) ×2 IMPLANT
PUSHER KNOT ARTHRO STRT FASTFI (MISCELLANEOUS) ×1 IMPLANT
SUT PROLENE 4 0 PS 2 18 (SUTURE) ×2 IMPLANT
SUT TICRON COATED BLUE 2 0 30 (SUTURE) IMPLANT
SYR 50ML LL SCALE MARK (SYRINGE) ×2 IMPLANT
SYSTEM NDL DEL FSTFX  360 CVD (Miscellaneous) IMPLANT
SYSTEM NVSTCH PRO MENISCAL 2-0 (Miscellaneous) IMPLANT
TUBING ARTHRO INFLOW-ONLY STRL (TUBING) ×2 IMPLANT
WAND WEREWOLF FLOW 90D (MISCELLANEOUS) ×2 IMPLANT

## 2020-03-22 NOTE — Anesthesia Preprocedure Evaluation (Addendum)
Anesthesia Evaluation  Patient identified by MRN, date of birth, ID band Patient awake    Reviewed: Allergy & Precautions, NPO status , Patient's Chart, lab work & pertinent test results  History of Anesthesia Complications Negative for: history of anesthetic complications  Airway Mallampati: III  TM Distance: >3 FB     Dental  (+) Teeth Intact, Caps   Pulmonary sleep apnea and Continuous Positive Airway Pressure Ventilation , neg COPD, Not current smoker,    Pulmonary exam normal        Cardiovascular hypertension, Pt. on medications and Pt. on home beta blockers (-) Past MI and (-) CHF Normal cardiovascular exam(-) dysrhythmias (-) Valvular Problems/Murmurs     Neuro/Psych neg Seizures Depression    GI/Hepatic GERD  Medicated and Controlled,  Endo/Other  diabetes, Type 2, Oral Hypoglycemic AgentsHypothyroidism   Renal/GU Renal InsufficiencyRenal disease     Musculoskeletal   Abdominal Normal abdominal exam  (+)   Peds  Hematology   Anesthesia Other Findings   Reproductive/Obstetrics                            Anesthesia Physical  Anesthesia Plan  ASA: III  Anesthesia Plan: General   Post-op Pain Management:    Induction: Intravenous  PONV Risk Score and Plan: 2 and Ondansetron and Dexamethasone  Airway Management Planned: Oral ETT and LMA  Additional Equipment:   Intra-op Plan:   Post-operative Plan: Extubation in OR  Informed Consent: I have reviewed the patients History and Physical, chart, labs and discussed the procedure including the risks, benefits and alternatives for the proposed anesthesia with the patient or authorized representative who has indicated his/her understanding and acceptance.       Plan Discussed with:   Anesthesia Plan Comments:        Anesthesia Quick Evaluation

## 2020-03-22 NOTE — Op Note (Signed)
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  Postoperative diagnosis:   Same  Procedure:   Arthroscopic medial meniscus repair with partial medial meniscectomy and abrasion chondroplasty of grade III-IV chondromalacia of femoral trochlea and grade III chondromalacia of medial femoral condyle, right knee.  Surgeon:   Maryagnes Amos, MD  Assistant:   Arcola Jansky, PA-S  Anesthesia:   General LMA  Findings:   As above. There was a complex tear of the posterior portion of the medial meniscus with both horizontal and radial components. The lateral meniscus was in satisfactory condition, as were the anterior and posterior cruciate ligaments. There were grade 3-4 chondromalacial changes involving the femoral trochlea, grade 3 chondromalacial changes involving the medial femoral condyle and patella, and grade 2 chondromalacial changes involving the medial tibial plateau. The lateral compartment was in satisfactory condition.  Complications:   None  EBL:   5 cc.  Total fluids:   600 cc of crystalloid.  Tourniquet time:   None  Drains:   None  Closure:   4-0 Prolene interrupted sutures.  Brief clinical note:   The patient is a 65 year old male with a several year history of progressive worsening posterior and medial sided right knee pain. His symptoms have progressed despite medications, activity modification, etc. His history and examination are consistent with a medial meniscus tear with underlying degenerative joint disease, all of which were confirmed by MRI scan. The patient presents at this time for arthroscopy, debridement, and repair versus partial medial meniscectomy.  Procedure:   The patient was brought into the operating room and lain in the supine position. After adequate general laryngeal mask anesthesia was obtained, a timeout was performed to verify the appropriate side. The  patient's right knee was injected sterilely using a solution of 30 cc of 1% lidocaine and 30 cc of 0.5% Sensorcaine with epinephrine. The right lower extremity was prepped with ChloraPrep solution before being draped sterilely. Preoperative antibiotics were administered. The expected portal sites were injected with 0.5% Sensorcaine with epinephrine before the camera was placed in the anterolateral portal and instrumentation performed through the anteromedial portal.   The knee was sequentially examined beginning in the suprapatellar pouch, then progressing to the patellofemoral space, the medial gutter and compartment, the notch, and finally the lateral compartment and gutter. The findings were as described above. Abundant reactive synovial tissues anteriorly were debrided using the full-radius resector in order to improve visualization. The medial compartment was quite tight, so a pie crusting technique was performed using an 18-gauge needle to improve visualization medially. The medial meniscus was carefully probed with the findings as described above. The horizontal component of the tear was repaired using a single [employer] FasT-Fix 360 device. Subsequent probing of this repair demonstrated excellent stability.    Several attempts were made to repair the radial portion of the tear, which was within 1 cm from the posterior root, using two additional FasT-Fix 360 devices and one Centerix suture.  However, the repair remained quite tenuous to probing so it was elected to simply debride out this portion of the tear with a full-radius resector.  Subsequent probing of the remaining rim demonstrated excellent stability. In addition, the areas of grade III-IV chondromalacia involving the femoral trochlea and the area of grade III chondromalacia involving the medial femoral condyle both were debrided back to stable margins using the full-radius resector. The instruments  were removed from the joint after  suctioning the excess fluid.   The portal sites were closed using 4-0 Prolene interrupted sutures before a sterile bulky dressing was applied to the knee. The patient was then awakened, extubated, and returned to the recovery room in satisfactory condition after tolerating the procedure well.

## 2020-03-22 NOTE — Transfer of Care (Signed)
Immediate Anesthesia Transfer of Care Note  Patient: Jacob Lawson  Procedure(s) Performed: Procedure(s) with comments: RIGHT KNEE ARTHROSCOPY WITH PARTIAL MENISCECTOMY (Right) CHONDROPLASTY (Right) - ABRASION CHONDROPLASTY KNEE ARTHROSCOPY WITH MENISCAL REPAIR (Right)  Patient Location: PACU  Anesthesia Type:General  Level of Consciousness: sedated  Airway & Oxygen Therapy: Patient Spontanous Breathing and Patient connected to face mask oxygen  Post-op Assessment: Report given to RN and Post -op Vital signs reviewed and stable  Post vital signs: Reviewed and stable  Last Vitals:  Vitals:   03/22/20 0917 03/22/20 1203  BP: 139/78 122/75  Pulse: (!) 58 79  Resp:  11  Temp: 36.7 C 36.7 C  SpO2: 98% 96%    Complications: No apparent anesthesia complications

## 2020-03-22 NOTE — Discharge Instructions (Addendum)
Orthopedic discharge instructions: Keep dressing dry and intact.  May shower after dressing changed on post-op day #4 (Saturday).  Cover staples/sutures with Band-Aids after drying off. Apply ice frequently to knee. Take ibuprofen 600-800 mg TID with meals for 7-10 days, then as necessary. Take pain medication as prescribed or ES Tylenol when needed.  May weight-bear as tolerated - use crutches or walker as needed. Follow-up in 10-14 days or as scheduled.    AMBULATORY SURGERY  DISCHARGE INSTRUCTIONS   1) The drugs that you were given will stay in your system until tomorrow so for the next 24 hours you should not:  A) Drive an automobile B) Make any legal decisions C) Drink any alcoholic beverage   2) You may resume regular meals tomorrow.  Today it is better to start with liquids and gradually work up to solid foods.  You may eat anything you prefer, but it is better to start with liquids, then soup and crackers, and gradually work up to solid foods.   3) Please notify your doctor immediately if you have any unusual bleeding, trouble breathing, redness and pain at the surgery site, drainage, fever, or pain not relieved by medication.  4) Your post-operative visit with Dr.                                     is: Date:                        Time:    Please call to schedule your post-operative visit.  5) Additional Instructions:

## 2020-03-22 NOTE — Anesthesia Procedure Notes (Signed)
Procedure Name: LMA Insertion Date/Time: 03/22/2020 10:42 AM Performed by: Clyde Lundborg, CRNA Pre-anesthesia Checklist: Patient identified, Emergency Drugs available, Suction available and Patient being monitored Patient Re-evaluated:Patient Re-evaluated prior to induction Oxygen Delivery Method: Circle system utilized Preoxygenation: Pre-oxygenation with 100% oxygen Induction Type: IV induction Ventilation: Mask ventilation without difficulty LMA: LMA inserted LMA Size: 4.0 Tube type: Oral Number of attempts: 1 Airway Equipment and Method: Stylet and Oral airway Placement Confirmation: positive ETCO2,  breath sounds checked- equal and bilateral and CO2 detector Tube secured with: Tape Dental Injury: Teeth and Oropharynx as per pre-operative assessment

## 2020-03-22 NOTE — H&P (Signed)
History of Present Illness: Jacob Lawson is a 65 y.o.male who is here for history and physical for an upcoming right knee arthroscopy to be done by Dr. Joice Lofts on March 22, 2020. The symptoms have been present intermittently for the past several years, but his symptoms worsened about 6 to 8 weeks ago when he felt a "grabbing sensation" in the back of his knee. Since then, his symptoms have worsened. He saw Horris Latino, PA-C, who sent him for an MRI scan and referred him to me for further evaluation and treatment. He reports 4/10 pain. The pain is located along the posterior aspect of the knee. The pain is described as aching, stabbing and throbbing. The symptoms are aggravated at higher levels of activity, walking, standing and standing pivot. He also describes no mechanical symptoms. He has no associated swelling and no deformity. He has tried over-the-counter medications and anti-inflammatories with limited benefit.  Current Outpatient Medications: . aspirin 81 MG chewable tablet Take 81 mg by mouth once daily.  . cholecalciferol (VITAMIN D3) 1000 unit capsule Take 1,000 Units by mouth 2 (two) times daily  . cyanocobalamin (VITAMIN B12) 500 MCG tablet Take 500 mcg by mouth once daily.  Marland Kitchen esomeprazole (NEXIUM) 20 MG DR capsule TAKE 1 CAPSULE (20 MG TOTAL) BY MOUTH ONCE DAILY. OR EVERY OTHER DAY FOR REFLUX (Patient taking differently: Take 20 mg by mouth once daily as needed around every 4-5 days per month for reflux ) 30 capsule 0  . etodolac (LODINE) 500 MG tablet Take 1 tablet (500 mg total) by mouth 2 (two) times daily 60 tablet 1  . finasteride (PROSCAR) 5 mg tablet Take 5 mg by mouth once daily. 3  . fluticasone (FLONASE) 50 mcg/actuation nasal spray Place 1-2 sprays into both nostrils once daily as needed for Allergies 16 g prn  . folic acid/multivit-min/lutein (CENTRUM SILVER ORAL) Take 1 tablet by mouth once daily  . krill-om3-dha-epa-om6-lip-astx (KRILL OIL, OMEGA 3 AND 6,) 1000-130(40-80) mg Cap  Take 1 capsule by mouth 2 (two) times daily  . levothyroxine (SYNTHROID) 125 MCG tablet Take 1 tablet (125 mcg total) by mouth every morning for thyroid 90 tablet 3  . liothyronine (CYTOMEL) 25 MCG tablet TAKE 0.5 TABS BY MOUTH DAILY ON AN EMPTY STOMACH WITH A GLASS OF WATER 30-60 MINS BEFORE BREAKFAST. 45 tablet 3  . lisinopriL (ZESTRIL) 40 MG tablet Take 1 tablet (40 mg total) by mouth once daily For blood pressure 90 tablet 3  . loratadine (CLARITIN) 10 mg tablet Take 10 mg by mouth once daily as needed for Allergies.  . metFORMIN (GLUCOPHAGE-XR) 500 MG XR tablet Take 1 tablet (500 mg total) by mouth 2 (two) times daily with meals 180 tablet 3  . metoprolol succinate (TOPROL-XL) 50 MG XL tablet Take 1 tablet (50 mg total) by mouth once daily For blood pressure 90 tablet 3  . sertraline (ZOLOFT) 50 MG tablet Take 1 tablet (50 mg total) by mouth once daily (Patient taking differently: Take 25 mg by mouth once daily ) 90 tablet 3  . testosterone (ANDROGEL) 1.25 gram/actuation topical gel 1 application transdermally daily (Patient taking differently: once daily. )  . UBIDECARENONE (COENZYME Q10) 50 mg Tab Take 50 mg by mouth once daily.   Allergies: No Known Allergies  Past Medical History:  . Allergic rhinitis  . Aortic atherosclerosis (CMS-HCC) 11/14/2017  3.0 x 3.0 cm aneurysm on 08/07/2017 [facility] screening; no aneurysm on 11/14/2017 CT, but did show atherosclerosis  . Bacteremia due to  Gram-negative bacteria 11/14/2018  Due to infected puncture wound of left foot; admitted at [facility] 10/16 to 11/10/2018 and treated with I&D, IV Unasyn x 10 days  . Carpal tunnel syndrome of left wrist 11/12/2017  . Carpal tunnel syndrome, bilateral  S/P R CT release  . Chronic low back pain  . Chronic right-sided low back pain with right-sided sciatica 01/29/2017  . Chronic urticaria  . Colloid thyroid nodule 07/11/2016  Left thyroid nodule, biopsied by Dr. Elenore Rota; folow up annually  . Complex tear of  medial meniscus of right knee as current injury 02/22/2020  . CRI (chronic renal insufficiency), stage 3 (moderate) 09/06/2016  11/18/2018 est GFR 56  . Degenerative tear of glenoid labrum of right shoulder 07/16/2019  . Depression  Mild  . Diabetes mellitus type 2, uncomplicated (CMS-HCC)  . Ectatic abdominal aorta (CMS-HCC) 11/14/2017  On 11/14/2017 CT; radiologist advised ultrasound in 2024  . Essential hypertension, benign 08/13/2013  . Fatty liver 02/20/2017  Suggested by appearance on 02/19/2017 ultrasound  . Ganglion of flexor tendon sheath of right middle finger 08/20/2018  . GERD (gastroesophageal reflux disease)  with gastritis on 10/2008 EGD  . High risk medication use 02/05/2014  Testosterone, simvastatin, lisinopril  . Hyperlipidemia  . Hyperlipidemia associated with type 2 diabetes mellitus (CMS-HCC)  . Hypertension associated with type 2 diabetes mellitus (CMS-HCC) 08/13/2013  . Hypogonadism in male  S/P Testim implant 03/2012  . Hypothyroidism  . Left carpal tunnel syndrome 08/13/2013  S/P R CT release  . Migraine headache  . Nontraumatic incomplete tear of right rotator cuff 04/15/2019  . Obesity (BMI 30-39.9), unspecified 01/29/2017  . Occipital neuralgia  Dr. Sherryll Burger  . Primary osteoarthritis of first carpometacarpal joint of right hand 08/20/2018  . Primary osteoarthritis of right knee 02/22/2020  . Pulmonary nodules 04/29/2016  On 03/31/2016 CT, 2 mm in RLL, 4 mm in left mid-lung; no need for follow-up if at low risk for lung cancer (as he is)  . Renal cyst, left 02/20/2017  On 02/19/2017 ultrasound ordered by Dr. Evelene Croon  . Rotator cuff tendinitis, right 04/15/2019  . Sleep apnea  OSA on CPAP  . Tendinitis of upper biceps tendon of right shoulder 07/16/2019  . Trigger middle finger of right hand 09/24/2019  . Type 2 diabetes mellitus with stage 3 chronic kidney disease, without long-term current use of insulin (CMS-HCC)  . Vitiligo   Past Surgical History:  . APPENDECTOMY  .  ARTHROSCOPIC ROTATOR CUFF REPAIR Left 02/2005  . BIOPSY PROSTATE NEEDLE/PUNCH 12/2015 (Dr. Artis Flock-- normal result)  . COLONOSCOPY 03/19/1989 (Normal Colon)  . COLONOSCOPY 07/04/1998 (Adenomatous Polyp)  . COLONOSCOPY 02/04/2002 (PH Adenomatous Polyp)  . COLONOSCOPY 01/21/2012, 10/28/2008, 08/19/2006  Adenomatous Polyps: [facility] 12/2016; Recall Ltr mailed 12/11/2016 (dw)  . COLONOSCOPY 02/26/2017 (Adenomatous Polyps: [facility] 02/2020)  . Congenital finger webbing surgery  . EGD 10/28/2008, 08/19/2006, 02/04/2002, 07/04/1998, 01/24/1993, 03/19/1989  . ENDOSCOPIC CARPAL TUNNEL RELEASE Right 11/2003  . ENDOSCOPIC CARPAL TUNNEL RELEASE Left 11/19/2017 (Dr. Elfredia Nevins)  . Excision of Morton's neuroma Left foot 06/2007  . Excision of the ganglion right long finger flexor sheath with release right long trigger finger. Right 09/23/2019 (Dr.Shakila Mak)  . Limited arthroscopic debridement, arthroscopic subacromial decompression, mini-open rotator cuff repair, and mini-open biceps tenodesis, right shoulder. Right 07/16/2019 (Dr.Dajion Bickford)  . Mandibular surgery in his late 30's for overbite  . Partial thyroidectomy for benign thyroid nodule  . TONSILLECTOMY AND ADENOIDECTOMY   Family History:  . Cancer Maternal Grandfather (oral cancer)  . Pancreatic cancer  Father 52  . Dementia Mother  . Prostate cancer Brother  . Bipolar disorder Other  . Ovarian cancer Sister  . Brain cancer Paternal Aunt   Social History:   Socioeconomic History:  Marland Kitchen Marital status: Married  Spouse name: Not on file  . Number of children: Not on file  . Years of education: Not on file  . Highest education level: Not on file  Occupational History  . Not on file  Tobacco Use  . Smoking status: Never Smoker  . Smokeless tobacco: Never Used  Substance and Sexual Activity  . Alcohol use: Yes  Alcohol/week: 0.0 standard drinks  Comment: Occasional  . Drug use: Not on file  . Sexual activity: Not on file  Other Topics Concern  . Not on file   Social History Narrative  . Not on file   Social Determinants of Health:   Financial Resource Strain: Not on file  Food Insecurity: Not on file  Transportation Needs: Not on file   Review of Systems:  A comprehensive 14 point ROS was performed, reviewed, and the pertinent orthopaedic findings are documented in the HPI.  Physical Exam: Vitals:  03/10/20 0829  BP: 137/78  Pulse: 58  Weight: (!) 114.3 kg (252 lb)  Height: 182.9 cm (6')  PainSc: 5  PainLoc: Knee   General/Constitutional: Pleasant husky middle-aged male in no acute distress. Neuro/Psych: Normal mood and affect, oriented to person, place and time. Eyes: Non-icteric. Pupils are equal, round, and reactive to light, and exhibit synchronous movement. Lymphatic: No palpable adenopathy. Respiratory: Lungs clear to auscultation, Normal chest excursion, No wheezes and Non-labored breathing Cardiovascular: Regular rate and rhythm. No murmurs. and No edema, swelling or tenderness, except as noted in detailed exam. Vascular: No edema, swelling or tenderness, except as noted in detailed exam. Integumentary: No impressive skin lesions present, except as noted in detailed exam. Musculoskeletal: Unremarkable, except as noted in detailed exam.   Heart: Examination of the heart reveals regular, rate, and rhythm. There is no murmur noted on ascultation. There is a normal apical pulse.  Lungs: Lungs are clear to auscultation. There is no wheeze, rhonchi, or crackles. There is normal expansion of bilateral chest walls.   Right knee exam: GAIT: normal and uses no assistive devices. ALIGNMENT: normal SKIN: unremarkable SWELLING: absent EFFUSION: absent WARMTH: no warmth TENDERNESS: mild over the posteromedial joint line ROM: 0-130 degrees with mild pain posteriorly in maximal flexion McMURRAY'S: positive PATELLOFEMORAL: normal tracking with no peri-patellar tenderness and negative apprehension sign CREPITUS: no LACHMAN'S:  negative PIVOT SHIFT: negative ANTERIOR DRAWER: negative POSTERIOR DRAWER: negative VARUS/VALGUS: stable   He is neurovascularly intact to the right lower extremity and foot.  Knee Imaging, external: Right knee: A recent MRI scan of the right knee is available for review. By report, the scan demonstrates evidence of a complex tear involving the medial and posterior portions of the medial meniscus with radial and horizontal components. There also appears to be a displaced meniscal flap tear. There is evidence of "full-thickness chondral fissuring along the inferior aspect of the patellar apex with underlying subchondral cystic change. Partial-thickness chondral surface irregularity within the trochlear groove" involving the patellofemoral compartment, as well as "mild chondral surface irregularity of the weightbearing medial compartment." The lateral compartment is in excellent condition, as are the anterior posterior cruciate ligaments. Both the films and report were reviewed by myself and discussed with the patient.  Assessment:  1. Complex tear of medial meniscus of right knee. 2. Obesity (BMI 30.0-34.9).  Plan: The treatment options were discussed with the patient. In addition, patient educational materials were provided regarding the diagnosis and treatment options. The patient is frustrated by his symptoms and functional limitations, and is ready to consider more aggressive treatment options. Therefore, I have recommended a surgical procedure, specifically a right knee arthroscopy with debridement and repair versus partial medial meniscectomy. The procedure was discussed with the patient, as were the potential risks (including bleeding, infection, nerve and/or blood vessel injury, persistent or recurrent pain, failure of the repair, progression of arthritis, need for further surgery, blood clots, strokes, heart attacks and/or arhythmias, pneumonia, etc.) and benefits. The patient states his  understanding and wishes to proceed. All of the patient's questions and concerns were answered. He can call any time with further concerns. He will return to work without restrictions. He will follow up post-surgery, routine.   H&P reviewed and patient re-examined. No changes.

## 2020-03-23 ENCOUNTER — Encounter: Payer: Self-pay | Admitting: Surgery

## 2020-03-24 NOTE — Anesthesia Postprocedure Evaluation (Signed)
Anesthesia Post Note  Patient: Jacob Lawson  Procedure(s) Performed: RIGHT KNEE ARTHROSCOPY WITH PARTIAL MENISCECTOMY (Right Knee) CHONDROPLASTY (Right Knee) KNEE ARTHROSCOPY WITH MENISCAL REPAIR (Right Knee)  Patient location during evaluation: PACU Anesthesia Type: General Level of consciousness: oriented and awake and alert Pain management: pain level controlled Vital Signs Assessment: post-procedure vital signs reviewed and stable Respiratory status: spontaneous breathing Cardiovascular status: blood pressure returned to baseline Anesthetic complications: no   No complications documented.   Last Vitals:  Vitals:   03/22/20 1230 03/22/20 1242  BP: 130/78 (!) (P) 141/75  Pulse: 74 (P) 73  Resp: 19 (P) 12  Temp: 36.9 C (P) 36.9 C  SpO2: 95% (P) 97%    Last Pain:  Vitals:   03/22/20 1242  TempSrc: (P) Temporal  PainSc: (P) 3                  Cayde Held

## 2021-01-31 ENCOUNTER — Other Ambulatory Visit: Payer: Self-pay | Admitting: Surgery

## 2021-01-31 DIAGNOSIS — S83231D Complex tear of medial meniscus, current injury, right knee, subsequent encounter: Secondary | ICD-10-CM

## 2021-01-31 DIAGNOSIS — M1711 Unilateral primary osteoarthritis, right knee: Secondary | ICD-10-CM

## 2021-02-09 ENCOUNTER — Ambulatory Visit
Admission: RE | Admit: 2021-02-09 | Discharge: 2021-02-09 | Disposition: A | Discharge status: Home / Self Care | Payer: Medicare Other | Source: Ambulatory Visit | Attending: Surgery | Admitting: Surgery

## 2021-02-09 ENCOUNTER — Other Ambulatory Visit: Payer: Self-pay

## 2021-02-09 DIAGNOSIS — M1711 Unilateral primary osteoarthritis, right knee: Secondary | ICD-10-CM | POA: Insufficient documentation

## 2021-02-09 DIAGNOSIS — S83231D Complex tear of medial meniscus, current injury, right knee, subsequent encounter: Secondary | ICD-10-CM | POA: Insufficient documentation

## 2021-05-17 ENCOUNTER — Other Ambulatory Visit: Payer: Self-pay | Admitting: Surgery

## 2021-05-19 ENCOUNTER — Other Ambulatory Visit
Admission: RE | Admit: 2021-05-19 | Discharge: 2021-05-19 | Disposition: A | Discharge status: Home / Self Care | Payer: Medicare Other | Source: Ambulatory Visit | Attending: Surgery | Admitting: Surgery

## 2021-05-19 HISTORY — DX: Angina pectoris, unspecified: I20.9

## 2021-05-19 HISTORY — DX: Nonrheumatic aortic valve disorder, unspecified: I35.9

## 2021-05-19 NOTE — Patient Instructions (Addendum)
?Your procedure is scheduled on: Thursday May 25, 2021. ?Report to [facility] inside [facility] 2nd floor, stop by admissions desk before getting on elevator. ?To find out your arrival time please call [phone] between 1PM - 3PM on Wednesday May 24, 2021. ? ?Remember: Instructions that are not followed completely may result in serious medical risk,  ?up to and including death, or upon the discretion of your surgeon and anesthesiologist your  ?surgery may need to be rescheduled.  ? ?  _X__ 1. Do not eat food after midnight the night before your procedure. ?                No chewing gum or hard candies. You may drink clear liquids up to 2 hours ?                before you are scheduled to arrive for your surgery- DO not drink clear ?                liquids within 2 hours of the start of your surgery. ?                Clear Liquids include:  water, apple juice without pulp, clear Gatorade, G2 or  ?                Gatorade Zero (avoid Red/Purple/Blue), Black Coffee or Tea (Do not add ?                anything to coffee or tea). ? ?__X__2.  On the morning of surgery brush your teeth with toothpaste and water, you ?               may rinse your mouth with mouthwash if you wish.  Do not swallow any toothpaste of mouthwash. ?   ? _X__ 3.  No Alcohol for 24 hours before or after surgery. ? ? _X__ 4.  Do Not Smoke or use e-cigarettes For 24 Hours Prior to Your Surgery. ?                Do not use any chewable tobacco products for at least 6 hours prior to ?                Surgery. ? ?_X__  5.  Do not use any recreational drugs (marijuana, cocaine, heroin, ecstasy, MDMA or other) ?               For at least one week prior to your surgery.  Combination of these drugs with anesthesia ?               May have life threatening results. ? ?____  6.  Bring all medications with you on the day of surgery if instructed.  ? ?__X__7 .  Notify your doctor if there is any change in your medical condition  ?     (cold, fever, infections). ?    ?Do not wear jewelry, make-up, hairpins, clips or nail polish. ?Do not wear lotions, powders, or perfumes. You may wear deodorant. ?Do not shave 48 hours prior to surgery. Men may shave face and neck. ?Do not bring valuables to the hospital.   ? ?[facility] is not responsible for any belongings or valuables. ? ?Contacts, dentures or bridgework may not be worn into surgery. ?Leave your suitcase in the car. After surgery it may be brought to your room. ?For patients admitted to the hospital, discharge time is determined by  your ?treatment team. ?  ?Patients discharged the day of surgery will not be allowed to drive home.   ?Make arrangements for someone to be with you for the first 24 hours of your ?Same Day Discharge. ? ?__X__ Take these medicines the morning of surgery with A SIP OF WATER:  ? ? 1. esomeprazole (NEXIUM) 20 MG ? 2. levothyroxine (SYNTHROID, LEVOTHROID) 125 MCG  ? 3. liothyronine (CYTOMEL) 25 MCG  ? 4. metoprolol succinate (TOPROL-XL) 50 MG 24 hr  ? 5. ? 6. ? ?____ Fleet Enema (as directed)  ? ?__X__ Use CHG Soap (or wipes) as directed ? ?____ Use Benzoyl Peroxide Gel as instructed ? ?____ Use inhalers on the day of surgery ? ?__X__ Stop metformin 2 days prior to surgery last dose 05/22/21    ? ?____ Take 1/2 of usual insulin dose the night before surgery. No insulin the morning ?         of surgery.  ? ?__X__ stop aspirin 81 mg 5 days  before your surgery.  ? ?__X__ One Week prior to surgery- Stop Anti-inflammatories such as Ibuprofen, Aleve, Advil, Motrin, meloxicam (MOBIC), diclofenac, etodolac, ketorolac, Toradol, Daypro, piroxicam, Goody's or BC powders. OK TO USE TYLENOL IF NEEDED ?  ?__X__ Stop supplements until after surgery.   ? ?____ Bring C-Pap to the hospital.  ? ? ?If you have any questions regarding your pre-procedure instructions,  ?Please call Pre-admit Testing at [phone] ?

## 2021-05-22 ENCOUNTER — Encounter: Payer: Self-pay | Admitting: Surgery

## 2021-05-25 ENCOUNTER — Encounter: Payer: Self-pay | Admitting: Surgery

## 2021-05-25 ENCOUNTER — Ambulatory Visit: Payer: Medicare Other | Admitting: Urgent Care

## 2021-05-25 ENCOUNTER — Other Ambulatory Visit: Payer: Self-pay

## 2021-05-25 ENCOUNTER — Ambulatory Visit
Admission: RE | Admit: 2021-05-25 | Discharge: 2021-05-25 | Disposition: A | Discharge status: Home / Self Care | Payer: Medicare Other | Attending: Surgery | Admitting: Surgery

## 2021-05-25 ENCOUNTER — Encounter
Admission: RE | Disposition: A | Discharge status: Home / Self Care | Payer: Self-pay | Source: Home / Self Care | Attending: Surgery

## 2021-05-25 DIAGNOSIS — I129 Hypertensive chronic kidney disease with stage 1 through stage 4 chronic kidney disease, or unspecified chronic kidney disease: Secondary | ICD-10-CM | POA: Diagnosis not present

## 2021-05-25 DIAGNOSIS — G4730 Sleep apnea, unspecified: Secondary | ICD-10-CM | POA: Insufficient documentation

## 2021-05-25 DIAGNOSIS — E1122 Type 2 diabetes mellitus with diabetic chronic kidney disease: Secondary | ICD-10-CM | POA: Insufficient documentation

## 2021-05-25 DIAGNOSIS — M1711 Unilateral primary osteoarthritis, right knee: Secondary | ICD-10-CM | POA: Insufficient documentation

## 2021-05-25 DIAGNOSIS — M23203 Derangement of unspecified medial meniscus due to old tear or injury, right knee: Secondary | ICD-10-CM | POA: Diagnosis not present

## 2021-05-25 DIAGNOSIS — M2241 Chondromalacia patellae, right knee: Secondary | ICD-10-CM | POA: Diagnosis not present

## 2021-05-25 DIAGNOSIS — E039 Hypothyroidism, unspecified: Secondary | ICD-10-CM | POA: Insufficient documentation

## 2021-05-25 DIAGNOSIS — K219 Gastro-esophageal reflux disease without esophagitis: Secondary | ICD-10-CM | POA: Diagnosis not present

## 2021-05-25 DIAGNOSIS — Z7984 Long term (current) use of oral hypoglycemic drugs: Secondary | ICD-10-CM | POA: Insufficient documentation

## 2021-05-25 DIAGNOSIS — N183 Chronic kidney disease, stage 3 unspecified: Secondary | ICD-10-CM | POA: Diagnosis not present

## 2021-05-25 HISTORY — DX: Occipital neuralgia: M54.81

## 2021-05-25 HISTORY — DX: Unspecified osteoarthritis, unspecified site: M19.90

## 2021-05-25 HISTORY — DX: Obstructive sleep apnea (adult) (pediatric): G47.33

## 2021-05-25 HISTORY — DX: Type 2 diabetes mellitus without complications: E11.9

## 2021-05-25 HISTORY — DX: Vitiligo: L80

## 2021-05-25 HISTORY — DX: Fatty (change of) liver, not elsewhere classified: K76.0

## 2021-05-25 HISTORY — DX: Chronic kidney disease, stage 3 unspecified: N18.30

## 2021-05-25 HISTORY — DX: Other forms of dyspnea: R06.09

## 2021-05-25 HISTORY — DX: Carpal tunnel syndrome, left upper limb: G56.02

## 2021-05-25 HISTORY — DX: Other chronic pain: G89.29

## 2021-05-25 HISTORY — DX: Other nonspecific abnormal finding of lung field: R91.8

## 2021-05-25 HISTORY — PX: KNEE ARTHROSCOPY: SHX127

## 2021-05-25 HISTORY — DX: Testicular hypofunction: E29.1

## 2021-05-25 HISTORY — DX: Cyst of kidney, acquired: N28.1

## 2021-05-25 HISTORY — DX: Nontoxic single thyroid nodule: E04.1

## 2021-05-25 HISTORY — DX: Other ill-defined heart diseases: I51.89

## 2021-05-25 HISTORY — DX: Low back pain, unspecified: M54.50

## 2021-05-25 HISTORY — DX: Migraine, unspecified, not intractable, without status migrainosus: G43.909

## 2021-05-25 LAB — GLUCOSE, CAPILLARY
Glucose-Capillary: 117 mg/dL — ABNORMAL HIGH (ref 70–99)
Glucose-Capillary: 110 mg/dL — ABNORMAL HIGH (ref 70–99)

## 2021-05-25 SURGERY — ARTHROSCOPY, KNEE
Anesthesia: General | Site: Knee | Laterality: Right

## 2021-05-25 MED ORDER — SODIUM CHLORIDE 0.9 % IV SOLN: INTRAVENOUS | Status: DC

## 2021-05-25 MED ORDER — ONDANSETRON HCL 4 MG PO TABS
4.0000 mg | ORAL_TABLET | Freq: Four times a day (QID) | ORAL | Status: DC | PRN
Start: 2021-05-25 — End: 2021-05-25

## 2021-05-25 MED ORDER — LACTATED RINGERS IR SOLN
Status: DC | PRN
  Administered 2021-05-25: 3001 mL

## 2021-05-25 MED ORDER — DEXAMETHASONE SODIUM PHOSPHATE 10 MG/ML IJ SOLN
INTRAMUSCULAR | Status: DC | PRN
  Administered 2021-05-25: 5 mg via INTRAVENOUS

## 2021-05-25 MED ORDER — ORAL CARE MOUTH RINSE: 15.0000 mL | Freq: Once | OROMUCOSAL | Status: AC

## 2021-05-25 MED ORDER — OXYCODONE HCL 5 MG PO TABS: 5.0000 mg | ORAL_TABLET | Freq: Once | ORAL | Status: DC | PRN

## 2021-05-25 MED ORDER — PROPOFOL 10 MG/ML IV BOLUS
INTRAVENOUS | Status: AC
  Filled 2021-05-25: qty 20

## 2021-05-25 MED ORDER — ONDANSETRON HCL 4 MG/2ML IJ SOLN
INTRAMUSCULAR | Status: DC | PRN
  Administered 2021-05-25: 4 mg via INTRAVENOUS

## 2021-05-25 MED ORDER — KETAMINE HCL 50 MG/5ML IJ SOSY
PREFILLED_SYRINGE | INTRAMUSCULAR | Status: AC
  Filled 2021-05-25: qty 5

## 2021-05-25 MED ORDER — MIDAZOLAM HCL 2 MG/2ML IJ SOLN
INTRAMUSCULAR | Status: DC | PRN
  Administered 2021-05-25: 2 mg via INTRAVENOUS

## 2021-05-25 MED ORDER — OXYCODONE HCL 5 MG/5ML PO SOLN: 5.0000 mg | Freq: Once | ORAL | Status: DC | PRN

## 2021-05-25 MED ORDER — RINGERS IRRIGATION IR SOLN: Status: DC | PRN

## 2021-05-25 MED ORDER — HYDROCODONE-ACETAMINOPHEN 5-325 MG PO TABS: 1.0000 | ORAL_TABLET | ORAL | Status: DC | PRN

## 2021-05-25 MED ORDER — HYDROCODONE-ACETAMINOPHEN 5-325 MG PO TABS: 1.0000 | ORAL_TABLET | Freq: Four times a day (QID) | ORAL | 0 refills | Status: DC | PRN

## 2021-05-25 MED ORDER — CHLORHEXIDINE GLUCONATE 0.12 % MT SOLN
15.0000 mL | Freq: Once | OROMUCOSAL | Status: AC
  Administered 2021-05-25: 15 mL via OROMUCOSAL

## 2021-05-25 MED ORDER — DROPERIDOL 2.5 MG/ML IJ SOLN: 0.6250 mg | Freq: Once | INTRAMUSCULAR | Status: DC | PRN

## 2021-05-25 MED ORDER — CEFAZOLIN SODIUM-DEXTROSE 2-4 GM/100ML-% IV SOLN
2.0000 g | INTRAVENOUS | Status: AC
  Administered 2021-05-25: 2 g via INTRAVENOUS

## 2021-05-25 MED ORDER — KETAMINE HCL 10 MG/ML IJ SOLN
INTRAMUSCULAR | Status: DC | PRN
Start: 2021-05-25 — End: 2021-05-25
  Administered 2021-05-25: 30 mg via INTRAVENOUS

## 2021-05-25 MED ORDER — KETOROLAC TROMETHAMINE 15 MG/ML IJ SOLN: 15.0000 mg | Freq: Once | INTRAMUSCULAR | Status: AC

## 2021-05-25 MED ORDER — FENTANYL CITRATE (PF) 100 MCG/2ML IJ SOLN
INTRAMUSCULAR | Status: DC | PRN
Start: 2021-05-25 — End: 2021-05-25
  Administered 2021-05-25 (×2): 25 ug via INTRAVENOUS

## 2021-05-25 MED ORDER — CEFAZOLIN SODIUM-DEXTROSE 2-4 GM/100ML-% IV SOLN
INTRAVENOUS | Status: AC
  Filled 2021-05-25: qty 100

## 2021-05-25 MED ORDER — BUPIVACAINE-EPINEPHRINE (PF) 0.5% -1:200000 IJ SOLN
INTRAMUSCULAR | Status: DC | PRN
  Administered 2021-05-25: 20 mL

## 2021-05-25 MED ORDER — LIDOCAINE HCL 1 % IJ SOLN
INTRAMUSCULAR | Status: DC | PRN
  Administered 2021-05-25: 60 mL via INTRAMUSCULAR

## 2021-05-25 MED ORDER — MIDAZOLAM HCL 2 MG/2ML IJ SOLN
INTRAMUSCULAR | Status: AC
  Filled 2021-05-25: qty 2

## 2021-05-25 MED ORDER — BUPIVACAINE-EPINEPHRINE (PF) 0.5% -1:200000 IJ SOLN
INTRAMUSCULAR | Status: AC
Start: 2021-05-25 — End: ?
  Filled 2021-05-25: qty 30

## 2021-05-25 MED ORDER — EPHEDRINE SULFATE (PRESSORS) 50 MG/ML IJ SOLN
INTRAMUSCULAR | Status: DC | PRN
  Administered 2021-05-25 (×2): 10 mg via INTRAVENOUS
  Administered 2021-05-25: 5 mg via INTRAVENOUS

## 2021-05-25 MED ORDER — CHLORHEXIDINE GLUCONATE 0.12 % MT SOLN
OROMUCOSAL | Status: AC
  Filled 2021-05-25: qty 15

## 2021-05-25 MED ORDER — ONDANSETRON HCL 4 MG/2ML IJ SOLN
4.0000 mg | Freq: Four times a day (QID) | INTRAMUSCULAR | Status: DC | PRN
Start: 2021-05-25 — End: 2021-05-25

## 2021-05-25 MED ORDER — LIDOCAINE HCL (CARDIAC) PF 100 MG/5ML IV SOSY
PREFILLED_SYRINGE | INTRAVENOUS | Status: DC | PRN
  Administered 2021-05-25: 100 mg via INTRAVENOUS

## 2021-05-25 MED ORDER — LIDOCAINE HCL (PF) 1 % IJ SOLN
INTRAMUSCULAR | Status: AC
  Filled 2021-05-25: qty 30

## 2021-05-25 MED ORDER — PROMETHAZINE HCL 25 MG/ML IJ SOLN: 6.2500 mg | INTRAMUSCULAR | Status: DC | PRN

## 2021-05-25 MED ORDER — ACETAMINOPHEN 10 MG/ML IV SOLN: 1000.0000 mg | Freq: Once | INTRAVENOUS | Status: DC | PRN

## 2021-05-25 MED ORDER — PROPOFOL 10 MG/ML IV BOLUS
INTRAVENOUS | Status: DC | PRN
  Administered 2021-05-25: 200 mg via INTRAVENOUS

## 2021-05-25 MED ORDER — FENTANYL CITRATE (PF) 100 MCG/2ML IJ SOLN: 25.0000 ug | INTRAMUSCULAR | Status: DC | PRN

## 2021-05-25 MED ORDER — GLYCOPYRROLATE 0.2 MG/ML IJ SOLN
INTRAMUSCULAR | Status: DC | PRN
  Administered 2021-05-25: .2 mg via INTRAVENOUS

## 2021-05-25 MED ORDER — LIDOCAINE HCL (PF) 2 % IJ SOLN
INTRAMUSCULAR | Status: AC
  Filled 2021-05-25: qty 5

## 2021-05-25 MED ORDER — METOCLOPRAMIDE HCL 10 MG PO TABS: 5.0000 mg | ORAL_TABLET | Freq: Three times a day (TID) | ORAL | Status: DC | PRN

## 2021-05-25 MED ORDER — FENTANYL CITRATE (PF) 100 MCG/2ML IJ SOLN
INTRAMUSCULAR | Status: AC
Start: 2021-05-25 — End: ?
  Filled 2021-05-25: qty 2

## 2021-05-25 MED ORDER — GLYCOPYRROLATE 0.2 MG/ML IJ SOLN
INTRAMUSCULAR | Status: AC
  Filled 2021-05-25: qty 1

## 2021-05-25 MED ORDER — EPHEDRINE 5 MG/ML INJ
INTRAVENOUS | Status: AC
  Filled 2021-05-25: qty 5

## 2021-05-25 MED ORDER — METOCLOPRAMIDE HCL 5 MG/ML IJ SOLN: 5.0000 mg | Freq: Three times a day (TID) | INTRAMUSCULAR | Status: DC | PRN

## 2021-05-25 MED ORDER — KETOROLAC TROMETHAMINE 15 MG/ML IJ SOLN
INTRAMUSCULAR | Status: AC
  Administered 2021-05-25: 15 mg via INTRAVENOUS
  Filled 2021-05-25: qty 1

## 2021-05-25 SURGICAL SUPPLY — 40 items
APL PRP STRL LF DISP 70% ISPRP (MISCELLANEOUS) ×1
BAG COUNTER SPONGE SURGICOUNT (BAG) IMPLANT
BAG SPNG CNTER NS LX DISP (BAG)
BLADE FULL RADIUS 3.5 (BLADE) ×2 IMPLANT
BLADE SHAVER 4.5X7 STR FR (MISCELLANEOUS) ×2 IMPLANT
BNDG ELASTIC 6X5.8 VLCR STR LF (GAUZE/BANDAGES/DRESSINGS) ×2 IMPLANT
BNDG ESMARK 6X12 TAN STRL LF (GAUZE/BANDAGES/DRESSINGS) ×2 IMPLANT
CHLORAPREP W/TINT 26 (MISCELLANEOUS) ×2 IMPLANT
CUFF TOURN SGL QUICK 24 (TOURNIQUET CUFF)
CUFF TOURN SGL QUICK 34 (TOURNIQUET CUFF)
CUFF TRNQT CYL 24X4X16.5-23 (TOURNIQUET CUFF) IMPLANT
CUFF TRNQT CYL 34X4.125X (TOURNIQUET CUFF) IMPLANT
DRAPE ARTHRO LIMB 89X125 STRL (DRAPES) ×2 IMPLANT
DRAPE IMP U-DRAPE 54X76 (DRAPES) ×2 IMPLANT
ELECT REM PT RETURN 9FT ADLT (ELECTROSURGICAL) ×2
ELECTRODE REM PT RTRN 9FT ADLT (ELECTROSURGICAL) ×1 IMPLANT
GAUZE SPONGE 4X4 12PLY STRL (GAUZE/BANDAGES/DRESSINGS) ×2 IMPLANT
GLOVE SURG ENC MOIS LTX SZ8 (GLOVE) ×4 IMPLANT
GLOVE SURG ENC TEXT LTX SZ7 (GLOVE) ×4 IMPLANT
GLOVE SURG UNDER LTX SZ8 (GLOVE) ×2 IMPLANT
GLOVE SURG UNDER POLY LF SZ7.5 (GLOVE) ×2 IMPLANT
GOWN STRL REUS W/ TWL LRG LVL3 (GOWN DISPOSABLE) ×1 IMPLANT
GOWN STRL REUS W/ TWL XL LVL3 (GOWN DISPOSABLE) ×2 IMPLANT
GOWN STRL REUS W/TWL LRG LVL3 (GOWN DISPOSABLE) ×2
GOWN STRL REUS W/TWL XL LVL3 (GOWN DISPOSABLE) ×4
IV LACTATED RINGER IRRG 3000ML (IV SOLUTION) ×2
IV LR IRRIG 3000ML ARTHROMATIC (IV SOLUTION) ×1 IMPLANT
KIT TURNOVER KIT A (KITS) ×2 IMPLANT
MANIFOLD NEPTUNE II (INSTRUMENTS) ×4 IMPLANT
NDL HYPO 21X1.5 SAFETY (NEEDLE) ×1 IMPLANT
NEEDLE HYPO 21X1.5 SAFETY (NEEDLE) ×2 IMPLANT
PACK ARTHROSCOPY KNEE (MISCELLANEOUS) ×2 IMPLANT
PENCIL ELECTRO HAND CTR (MISCELLANEOUS) ×2 IMPLANT
SPONGE T-LAP 18X18 ~~LOC~~+RFID (SPONGE) ×2 IMPLANT
SUT PROLENE 4 0 PS 2 18 (SUTURE) ×2 IMPLANT
SUT TICRON COATED BLUE 2 0 30 (SUTURE) IMPLANT
SYR 50ML LL SCALE MARK (SYRINGE) ×2 IMPLANT
TUBING INFLOW SET DBFLO PUMP (TUBING) ×2 IMPLANT
WAND WEREWOLF FLOW 90D (MISCELLANEOUS) ×2 IMPLANT
WATER STERILE IRR 500ML POUR (IV SOLUTION) ×2 IMPLANT

## 2021-05-25 NOTE — Op Note (Signed)
05/25/2021 ? ?11:42 AM ? ?Patient:   [name] ? ?Pre-Op Diagnosis:   Recurrent complex tear of medial meniscus with underlying degenerative joint disease, right knee. ? ?Postoperative diagnosis:   Same ? 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 ? ?Surgeon:   Pascal Lux, MD ? ?Assistant:   Myra Rude, PA-S ? ?Anesthesia:   General LMA ? ?Findings:   As above.  The lateral meniscus was in satisfactory condition as per the anterior and posterior cruciate ligaments.  In addition, there were grade 1-2 chondromalacial changes involving the medial femoral condyle and medial tibial plateau. ? ?Complications:   None ? ?EBL:   5 cc. ? ?Total fluids:   800 cc of crystalloid. ? ?Tourniquet time:   None ? ?Drains:   None ? ?Closure:   4-0 Prolene interrupted sutures. ? ?Brief clinical note:   The patient is a 66 year old male who is now 14 months status post a right knee arthroscopy with debridement and partial medial meniscectomy. Initially, the patient did quite well but developed recurrent medial sided right knee symptoms about 5 months postoperatively after feeling a "pop" in his knee. Subsequent work-up has confirmed the presence of a recurrent medial meniscus tear with underlying degenerative joint disease. His symptoms have persisted despite medications, activity modification, etc. The patient presents at this time for arthroscopy, debridement, and partial medial meniscectomy. ? ?Procedure:   The patient was brought into the operating room and lain in the supine position. After adequate general laryngeal mask anesthesia was obtained, a timeout was performed to verify the appropriate side. The patient's right knee was injected sterilely using a solution of 30 cc of 1% lidocaine and 30 cc of 0.5% Sensorcaine with epinephrine. The right lower extremity was prepped with ChloraPrep solution before  being draped sterilely. Preoperative antibiotics were administered. The expected portal sites were injected with 0.5% Sensorcaine with epinephrine before the camera was placed in the anterolateral portal and instrumentation performed through the anteromedial portal.  ? ?The knee was sequentially examined beginning in the suprapatellar pouch, then progressing to the patellofemoral space, the medial gutter and compartment, the notch, and finally the lateral compartment and gutter. The findings were as described above. Abundant reactive synovial tissues anteriorly were debrided using the full-radius resector in order to improve visualization.  The torn portion of the medial meniscus was debrided back to stable margins using a combination of the mini-munchers and full-radius resector. Subsequent probing of the remaining rim demonstrated excellent stability. Areas of loose articular cartilage involving the weightbearing portion of the medial femoral condyle as well as the femoral trochlea were debrided back to stable margins using the full-radius resector. The instruments were removed from the joint after suctioning the excess fluid.  ? ?The portal sites were closed using 4-0 Prolene interrupted sutures before a sterile bulky dressing was applied to the knee. The patient was then awakened, extubated, and returned to the recovery room in satisfactory condition after tolerating the procedure well. ?

## 2021-05-25 NOTE — Anesthesia Procedure Notes (Addendum)
Procedure Name: LMA Insertion ?Date/Time: 05/25/2021 10:28 AM ?Performed by: Berniece Pap, CRNA ?Pre-anesthesia Checklist: Patient identified, Emergency Drugs available, Suction available and Patient being monitored ?Patient Re-evaluated:Patient Re-evaluated prior to induction ?Oxygen Delivery Method: Circle system utilized ?Preoxygenation: Pre-oxygenation with 100% oxygen ?Induction Type: IV induction ?Ventilation: Mask ventilation without difficulty ?LMA: LMA inserted ?LMA Size: 5.0 ?Tube type: Oral ?Number of attempts: 1 ?Placement Confirmation: ETT inserted through vocal cords under direct vision, positive ETCO2 and breath sounds checked- equal and bilateral ?Tube secured with: Tape ?Dental Injury: Teeth and Oropharynx as per pre-operative assessment  ? ? ? ? ?

## 2021-05-25 NOTE — H&P (Signed)
History of Present Illness:  ?Jacob Lawson is a 66 y.o. male who presents for follow-up of his medial sided right knee pain secondary to a recurrent medial meniscus tear with mild underlying degenerative joint disease. The patient continues to note moderate pain in his knee which he rates as high as 6/10. He localizes the pain to the medial aspect of his knee. His symptoms are worse with any prolonged standing or ambulation. He has been taking ibuprofen as necessary with limited benefit. He notes that the injection he received at his last visit 3 months ago provided temporary partial relief of his symptoms but only lasted a month or so before his symptoms recurred. He denies any reinjury to the knee since his last visit, and denies any numbness or paresthesias down his leg to his foot. He is quite frustrated by his functional limitations and is ready to consider more aggressive treatment options. ? ?Current Outpatient Medications:l  ? aspirin 81 MG chewable tablet Take 81 mg by mouth once daily.  ? cholecalciferol (VITAMIN D3) 1000 unit capsule Take 1,000 Units by mouth 2 (two) times daily  ? cyanocobalamin (VITAMIN B12) 500 MCG tablet Take 500 mcg by mouth once daily.  ? esomeprazole (NEXIUM) 20 MG DR capsule Take 1 capsule (20 mg total) by mouth once daily as needed around every 1-2 days per month for reflux  ? finasteride (PROSCAR) 5 mg tablet Take 1 tablet by mouth once daily  ? fluticasone propionate (FLONASE) 50 mcg/actuation nasal spray Place 1-2 sprays into both nostrils once daily as needed for Allergies 16 g prn  ? folic acid/multivit-min/lutein (CENTRUM SILVER ORAL) Take 1 tablet by mouth once daily  ? krill-om3-dha-epa-om6-lip-astx 1000-130(40-80) mg Cap Take 1 capsule by mouth 2 (two) times daily  ? levothyroxine (SYNTHROID) 125 MCG tablet Take 1 tablet (125 mcg total) by mouth every morning for thyroid 90 tablet 3  ? liothyronine (CYTOMEL) 25 MCG tablet Take 0.5 tablets (12.5 mcg total) by mouth once  daily Take on an empty stomach with a glass of water at least 30-60 minutes before breakfast. 45 tablet 1  ? lisinopriL (ZESTRIL) 40 MG tablet Take 1 tablet (40 mg total) by mouth once daily for blood pressure 90 tablet 3  ? loratadine (CLARITIN) 10 mg tablet Take 10 mg by mouth once daily as needed for Allergies.  ? meloxicam (MOBIC) 7.5 MG tablet TAKE 1 TABLET (7.5 MG TOTAL) BY MOUTH ONCE DAILY 30 tablet 1  ? metFORMIN (GLUCOPHAGE-XR) 500 MG XR tablet Take 1 tablet (500 mg total) by mouth 2 (two) times daily with meals 180 tablet 0  ? metoprolol succinate (TOPROL-XL) 50 MG XL tablet Take 1 tablet (50 mg total) by mouth once daily for blood pressure 90 tablet 3  ? mometasone (ELOCON) 0.1 % ointment  ? sertraline (ZOLOFT) 50 MG tablet Take 1 tablet (50 mg total) by mouth once daily 90 tablet 1  ? tiZANidine (ZANAFLEX) 4 MG tablet TAKE 1 TABLET (4 MG TOTAL) BY MOUTH EVERY DAY AT BEDTIME AS NEEDED 30 tablet 1  ? UBIDECARENONE (COENZYME Q10) 50 mg Tab Take 50 mg by mouth once daily.  ? ?Allergies:  ? Lamotrigine Hives  ? Simvastatin Muscle Pain and Other (Muscle aches)  ? Statins-Hmg-Coa Reductase Inhibitors Muscle Pain and Other (See Comments)  ?Simvastatin, pravastatin and others ?Muscle/joint aches  ? ?Past Medical History:  ? Allergic rhinitis  ? Aortic atherosclerosis (CMS-HCC) 11/14/2017  ?3.0 x 3.0 cm aneurysm on 08/07/2017 [facility] screening; no aneurysm on  11/14/2017 CT, but did show atherosclerosis  ? Bacteremia due to Gram-negative bacteria 11/14/2018  ?Due to infected puncture wound of left foot; admitted at [facility] 10/16 to 11/10/2018 and treated with I&D, IV Unasyn x 10 days  ? Carpal tunnel syndrome of left wrist 11/12/2017  ? Carpal tunnel syndrome, bilateral (S/P R CT release)  ? Chronic low back pain  ? Chronic right-sided low back pain with right-sided sciatica 01/29/2017  ? Chronic urticaria  ? Colloid thyroid nodule 07/11/2016 (Left thyroid nodule, biopsied by Dr. Kathyrn Sheriff; folow up annually)  ? Complex  tear of medial meniscus of right knee as current injury 02/22/2020  ? CRI (chronic renal insufficiency), stage 3 (moderate) 09/06/2016  ?11/18/2018 est GFR 56  ? Degenerative tear of glenoid labrum of right shoulder 07/16/2019  ? Depression (Mild)  ? Diabetes mellitus type 2, uncomplicated (CMS-HCC)  ? Ectatic abdominal aorta (CMS-HCC) 11/14/2017  ?On 11/14/2017 CT; radiologist advised ultrasound in 2024  ? Essential hypertension, benign 08/13/2013  ? Fatty liver 02/20/2017  ?Suggested by appearance on 02/19/2017 ultrasound  ? Ganglion of flexor tendon sheath of right middle finger 08/20/2018  ? GERD (with gastritis on 10/2008 EGD)  ? High risk medication use 02/05/2014 (Testosterone, simvastatin, lisinopril)  ? Hyperlipidemia  ? Hyperlipidemia associated with type 2 diabetes mellitus (CMS-HCC)  ? Hypertension associated with type 2 diabetes mellitus (CMS-HCC) 08/13/2013  ? Hypogonadism in male (S/P Testim implant 03/2012)  ? Hypothyroidism  ? Left carpal tunnel syndrome 08/13/2013 (S/P R CT release)  ? Migraine headache  ? Nontraumatic incomplete tear of right rotator cuff 04/15/2019  ? Obesity (BMI 30-39.9), unspecified 01/29/2017  ? Occipital neuralgia (Dr. Manuella Ghazi)  ? Primary osteoarthritis of first carpometacarpal joint of right hand 08/20/2018  ? Primary osteoarthritis of right knee 02/22/2020  ? Pulmonary nodules 04/29/2016 (On 03/31/2016 CT, 2 mm in RLL, 4 mm in left mid-lung; no need for follow-up if at low risk for lung cancer (as he is)  ? Renal cyst, left 02/20/2017 (On 02/19/2017 ultrasound ordered by Dr. Yves Dill)  ? Rotator cuff tendinitis, right 04/15/2019  ? Sleep apnea (OSA on CPAP  ? Tendinitis of upper biceps tendon of right shoulder 07/16/2019  ? Trigger middle finger of right hand 09/24/2019  ? Type 2 diabetes mellitus with stage 3 chronic kidney disease, without long-term current use of insulin (CMS-HCC)  ? Vitiligo  ? ?Past Surgical History:  ? COLONOSCOPY 03/19/1989 (Normal Colon)  ? COLONOSCOPY 07/04/1998 (Adenomatous  Polyp)  ? COLONOSCOPY 02/04/2002 ([place] Adenomatous Polyp)  ? ENDOSCOPIC CARPAL TUNNEL RELEASE Right 11/2003  ? ARTHROSCOPIC ROTATOR CUFF REPAIR Left 02/2005  ? Excision of neuroma Left 06/2007 (Left foot)  ? BIOPSY PROSTATE NEEDLE/PUNCH 12/2015 (Dr. Rogers Blocker- nl result)  ? COLONOSCOPY 02/26/2017 (Adenomatous Polyps: CBF 02/2020)  ? ENDOSCOPIC CARPAL TUNNEL RELEASE Left 11/19/2017  ?Dr. Little Ishikawa  ? Limited arthroscopic debridement, arthroscopic subacromial decompression, mini-open rotator cuff repair, and mini-open biceps tenodesis, right shoulder. Right 07/16/2019 (Dr. Roland Rack)  ? Excision of the ganglion right long finger flexor sheath with release right long trigger finger. Right 09/23/2019 (Dr. Roland Rack)  ? COLONOSCOPY 03/07/2020 (Diverticulosis/Otherwise normal/PHx CP/Repeat 56yrs/CTL)  ? EGD 03/07/2020 (Fundic Gland polyp/Repeat as needed/CTL)  ? Arthroscopic medial meniscus repair with partial medial meniscectomy and abrasion chondroplasty of grade III-IV chondromalacia of femoral trochlea and grade III chondromalacia of medial femoral condyle, right knee. Right 03/22/2020 (Dr. Roland Rack)  ? APPENDECTOMY  ? COLONOSCOPY 01/21/2012, 10/28/2008, 08/19/2006  ?Adenomatous Polyps: CBF 12/2016; Recall Ltr mailed 12/11/2016 (dw)  ?  Congenital finger webbing surgery  ? EGD 10/28/2008, 08/19/2006, 02/04/2002, 07/04/1998, 01/24/1993, 03/19/1989  ? Mandibular surgery in his late 30's for overbite  ? Partial thyroidectomy for benign thyroid nodule  ? TONSILLECTOMY AND ADENOIDECTOMY  ? ?Family History:  ? Cancer Maternal Grandfather (oral cancer)  ? Pancreatic cancer Father 57  ? Dementia Mother  ? Prostate cancer Brother  ? Bipolar disorder Other  ? Ovarian cancer Sister  ? Brain cancer Paternal Aunt  ? ?Social History:  ? ?Socioeconomic History:  ? Marital status: Married  ?Tobacco Use  ? Smoking status: Never  ? Smokeless tobacco: Never  ?Substance and Sexual Activity  ? Alcohol use: Yes  ?Alcohol/week: 0.0 standard drinks  ?Comment:  Occasional  ? ?Review of Systems:  ?A comprehensive 14 point ROS was performed, reviewed, and the pertinent orthopaedic findings are documented in the HPI. ? ?Physical Exam: ?Vitals:  ?05/15/21 1133  ?BP:

## 2021-05-25 NOTE — Discharge Instructions (Addendum)
Orthopedic discharge instructions: ?Keep dressing dry and intact.  ?May shower after dressing changed on post-op day #4 (Monday).  ?Cover sutures with Band-Aids after drying off. ?Apply ice frequently to knee. ?Take Aleve 2 tabs BID with meals for 5-7 days, then as necessary. ?Take pain medication as prescribed or ES Tylenol when needed.  ?May weight-bear as tolerated - use crutches or walker as needed. ?Follow-up in 10-14 days or as scheduled. ? ?AMBULATORY SURGERY  ?DISCHARGE INSTRUCTIONS ? ? ?The drugs that you were given will stay in your system until tomorrow so for the next 24 hours you should not: ? ?Drive an automobile ?Make any legal decisions ?Drink any alcoholic beverage ? ? ?You may resume regular meals tomorrow.  Today it is better to start with liquids and gradually work up to solid foods. ? ?You may eat anything you prefer, but it is better to start with liquids, then soup and crackers, and gradually work up to solid foods. ? ? ?Please notify your doctor immediately if you have any unusual bleeding, trouble breathing, redness and pain at the surgery site, drainage, fever, or pain not relieved by medication. ?  ? ? ? ?Additional Instructions:  ?

## 2021-05-25 NOTE — Anesthesia Preprocedure Evaluation (Addendum)
Anesthesia Evaluation  ?Patient identified by MRN, date of birth, ID band ?Patient awake ? ? ? ?Reviewed: ?Allergy & Precautions, NPO status , Patient's Chart, lab work & pertinent test results, reviewed documented beta blocker date and time  ? ?History of Anesthesia Complications ?Negative for: history of anesthetic complications ? ?Airway ?Mallampati: III ? ?TM Distance: >3 FB ? ? ? ? Dental ? ?(+) Teeth Intact, Caps ?  ?Pulmonary ?sleep apnea and Continuous Positive Airway Pressure Ventilation , neg COPD, Not current smoker,  ?  ?Pulmonary exam normal ? ? ? ? ? ? ? Cardiovascular ?Exercise Tolerance: Good ?hypertension, Pt. on medications and Pt. on home beta blockers ?+ DOE  ?(-) Past MI and (-) CHF Normal cardiovascular exam(-) dysrhythmias (-) Valvular Problems/Murmurs ? ?MPS 2022: ?Regional wall motion: ?reveals normal myocardial thickening and wall  ?motion.  ?The overall quality of the study is good. ?  ?Artifacts noted: no  ?Left ventricular cavity: normal.  ? ?  ?Neuro/Psych ?neg Headaches, neg Seizures PSYCHIATRIC DISORDERS Depression negative neurological ROS ?   ? GI/Hepatic ?GERD  Medicated and Controlled,  ?Endo/Other  ?diabetes, Type 2, Oral Hypoglycemic AgentsHypothyroidism  ? Renal/GU ?Renal InsufficiencyRenal disease  ? ?  ?Musculoskeletal ? ?(+) Arthritis ,  ? Abdominal ?Normal abdominal exam  (+)   ?Peds ? Hematology ?  ?Anesthesia Other Findings ? ? Reproductive/Obstetrics ? ?  ? ? ? ? ? ? ? ? ? ? ? ? ? ?  ?  ? ? ? ? ? ? ?Anesthesia Physical ? ?Anesthesia Plan ? ?ASA: III ? ?Anesthesia Plan: General  ? ?Post-op Pain Management: Toradol IV (intra-op)*, Ofirmev IV (intra-op)* and Regional block*  ? ?Induction: Intravenous ? ?PONV Risk Score and Plan: 2 and Ondansetron and Dexamethasone ? ?Airway Management Planned: LMA ? ?Additional Equipment:  ? ?Intra-op Plan:  ? ?Post-operative Plan: Extubation in OR ? ?Informed Consent: I have reviewed the patients History  and Physical, chart, labs and discussed the procedure including the risks, benefits and alternatives for the proposed anesthesia with the patient or authorized representative who has indicated his/her understanding and acceptance.  ? ? ? ?Dental advisory given ? ?Plan Discussed with: CRNA and Anesthesiologist ? ?Anesthesia Plan Comments:   ? ? ? ? ? ?Anesthesia Quick Evaluation ? ?

## 2021-05-25 NOTE — Transfer of Care (Signed)
Immediate Anesthesia Transfer of Care Note ? ?Patient: Jacob Lawson ? ?Procedure(s) Performed: RIGHT KNEE ARTHROSCOPY WITH DEBRIDEMENT AND PARTIAL MEDIAL MENISCECTOMY (Right: Knee) ? ?Patient Location: PACU ? ?Anesthesia Type:General ? ?Level of Consciousness: sedated ? ?Airway & Oxygen Therapy: Patient Spontanous Breathing and Patient connected to face mask oxygen ? ?Post-op Assessment: Report given to RN and Post -op Vital signs reviewed and stable ? ?Post vital signs: Reviewed ? ?Last Vitals:  ?Vitals Value Taken Time  ?BP 130/74 05/25/21 1130  ?Temp 36.8 ?C 05/25/21 1130  ?Pulse 68 05/25/21 1131  ?Resp 11 05/25/21 1131  ?SpO2 94 % 05/25/21 1131  ?Vitals shown include unvalidated device data. ? ?Last Pain:  ?   ? ?  ? ?Complications: No notable events documented. ?

## 2021-05-26 NOTE — Anesthesia Postprocedure Evaluation (Signed)
Anesthesia Post Note ? ?Patient: Jacob Lawson ? ?Procedure(s) Performed: RIGHT KNEE ARTHROSCOPY WITH DEBRIDEMENT AND PARTIAL MEDIAL MENISCECTOMY (Right: Knee) ? ?Patient location during evaluation: PACU ?Anesthesia Type: General ?Level of consciousness: combative ?Pain management: pain level controlled ?Vital Signs Assessment: post-procedure vital signs reviewed and stable ?Respiratory status: spontaneous breathing, nonlabored ventilation and respiratory function stable ?Cardiovascular status: blood pressure returned to baseline and stable ?Postop Assessment: no apparent nausea or vomiting ?Anesthetic complications: no ? ? ?No notable events documented. ? ? ?Last Vitals:  ?Vitals:  ? 05/25/21 1212 05/25/21 1258  ?BP: (!) 174/95 140/90  ?Pulse: 66   ?Resp: 18   ?Temp: (!) 36.1 ?C   ?SpO2: 96%   ?  ?Last Pain:  ?Vitals:  ? 05/26/21 0900  ?TempSrc:   ?PainSc: 0-No pain  ? ? ?  ?  ?  ?  ?  ?  ? ?Foye Deer ? ? ? ? ?

## 2021-11-02 ENCOUNTER — Other Ambulatory Visit: Payer: Self-pay | Admitting: Internal Medicine

## 2021-11-02 DIAGNOSIS — I2089 Other forms of angina pectoris: Secondary | ICD-10-CM

## 2021-11-02 DIAGNOSIS — I25119 Atherosclerotic heart disease of native coronary artery with unspecified angina pectoris: Secondary | ICD-10-CM

## 2021-11-02 DIAGNOSIS — R0602 Shortness of breath: Secondary | ICD-10-CM

## 2021-11-08 ENCOUNTER — Telehealth (HOSPITAL_COMMUNITY): Payer: Self-pay | Admitting: Emergency Medicine

## 2021-11-08 NOTE — Telephone Encounter (Signed)
Attempted to call patient regarding upcoming cardiac CT appointment. °Left message on voicemail with name and callback number °Emanuelle Bastos RN Navigator Cardiac Imaging °[facility] °[phone] Office °[phone] Cell ° °

## 2021-11-09 ENCOUNTER — Ambulatory Visit
Admission: RE | Admit: 2021-11-09 | Discharge: 2021-11-09 | Disposition: A | Discharge status: Home / Self Care | Payer: Medicare Other | Source: Ambulatory Visit | Attending: Internal Medicine | Admitting: Internal Medicine

## 2021-11-09 DIAGNOSIS — R0602 Shortness of breath: Secondary | ICD-10-CM | POA: Insufficient documentation

## 2021-11-09 DIAGNOSIS — I2089 Other forms of angina pectoris: Secondary | ICD-10-CM | POA: Diagnosis present

## 2021-11-09 DIAGNOSIS — I25119 Atherosclerotic heart disease of native coronary artery with unspecified angina pectoris: Secondary | ICD-10-CM | POA: Insufficient documentation

## 2021-11-09 LAB — POCT I-STAT CREATININE: Creatinine, Ser: 1.2 mg/dL (ref 0.61–1.24)

## 2021-11-09 MED ORDER — IOHEXOL 350 MG/ML SOLN
100.0000 mL | Freq: Once | INTRAVENOUS | Status: AC | PRN
  Administered 2021-11-09: 100 mL via INTRAVENOUS

## 2021-11-09 MED ORDER — NITROGLYCERIN 0.4 MG SL SUBL
0.8000 mg | SUBLINGUAL_TABLET | Freq: Once | SUBLINGUAL | Status: AC
  Administered 2021-11-09: 0.8 mg via SUBLINGUAL

## 2021-11-09 NOTE — Progress Notes (Signed)
Patient tolerated CT well. Drank water after. Vital signs stable encourage to drink water throughout day.Reasons explained and verbalized understanding. Ambulated steady gait.  

## 2022-01-26 ENCOUNTER — Other Ambulatory Visit: Payer: Self-pay | Admitting: Infectious Diseases

## 2022-01-26 ENCOUNTER — Ambulatory Visit
Admission: RE | Admit: 2022-01-26 | Discharge: 2022-01-26 | Disposition: A | Discharge status: Home / Self Care | Payer: Medicare Other | Source: Ambulatory Visit | Attending: Infectious Diseases | Admitting: Infectious Diseases

## 2022-01-26 DIAGNOSIS — R42 Dizziness and giddiness: Secondary | ICD-10-CM | POA: Diagnosis present

## 2022-01-26 DIAGNOSIS — R413 Other amnesia: Secondary | ICD-10-CM

## 2022-01-31 ENCOUNTER — Other Ambulatory Visit: Payer: Self-pay | Admitting: Infectious Diseases

## 2022-01-31 DIAGNOSIS — R413 Other amnesia: Secondary | ICD-10-CM

## 2022-01-31 DIAGNOSIS — F3342 Major depressive disorder, recurrent, in full remission: Secondary | ICD-10-CM

## 2022-01-31 DIAGNOSIS — R42 Dizziness and giddiness: Secondary | ICD-10-CM

## 2022-01-31 DIAGNOSIS — R202 Paresthesia of skin: Secondary | ICD-10-CM

## 2022-02-09 ENCOUNTER — Other Ambulatory Visit
Admission: RE | Admit: 2022-02-09 | Discharge: 2022-02-09 | Disposition: A | Discharge status: Home / Self Care | Payer: Medicare Other | Attending: Urology | Admitting: Urology

## 2022-02-09 ENCOUNTER — Ambulatory Visit: Payer: Medicare Other | Admitting: Urology

## 2022-02-09 ENCOUNTER — Encounter: Payer: Self-pay | Admitting: Urology

## 2022-02-09 VITALS — BP 168/95 | HR 100 | Ht 72.0 in | Wt 242.0 lb

## 2022-02-09 DIAGNOSIS — N401 Enlarged prostate with lower urinary tract symptoms: Secondary | ICD-10-CM

## 2022-02-09 DIAGNOSIS — N138 Other obstructive and reflux uropathy: Secondary | ICD-10-CM | POA: Insufficient documentation

## 2022-02-09 DIAGNOSIS — Z8639 Personal history of other endocrine, nutritional and metabolic disease: Secondary | ICD-10-CM

## 2022-02-09 DIAGNOSIS — Z8042 Family history of malignant neoplasm of prostate: Secondary | ICD-10-CM | POA: Diagnosis not present

## 2022-02-09 DIAGNOSIS — N529 Male erectile dysfunction, unspecified: Secondary | ICD-10-CM

## 2022-02-09 LAB — CBC
HCT: 51 % (ref 39.0–52.0)
Hemoglobin: 16.9 g/dL (ref 13.0–17.0)
MCH: 27 pg (ref 26.0–34.0)
MCHC: 33.1 g/dL (ref 30.0–36.0)
MCV: 81.5 fL (ref 80.0–100.0)
Platelets: 178 10*3/uL (ref 150–400)
RBC: 6.26 MIL/uL — ABNORMAL HIGH (ref 4.22–5.81)
RDW: 13.9 % (ref 11.5–15.5)
WBC: 6.7 10*3/uL (ref 4.0–10.5)
nRBC: 0 % (ref 0.0–0.2)

## 2022-02-09 LAB — PSA: Prostatic Specific Antigen: 1.11 ng/mL (ref 0.00–4.00)

## 2022-02-09 MED ORDER — XYOSTED 100 MG/0.5ML ~~LOC~~ SOAJ: 0.5000 mL | SUBCUTANEOUS | 5 refills | Status: DC

## 2022-02-09 MED ORDER — SILDENAFIL CITRATE 20 MG PO TABS: ORAL_TABLET | ORAL | 11 refills | Status: DC

## 2022-02-09 NOTE — Progress Notes (Signed)
I, Jacob Lawson,acting as a scribe for Jacob Espy, MD.,have documented all relevant documentation on the behalf of Jacob Espy, MD,as directed by  Jacob Espy, MD while in the presence of Jacob Espy, MD.   I, Jacob Lawson as a scribe for Jacob Espy, MD.,have documented all relevant documentation on the behalf of Jacob Espy, MD,as directed by  Jacob Espy, MD while in the presence of Jacob Espy, MD.   02/09/22 10:29 AM   Jacob Lawson [birth year] [id number]  Referring provider: Ezequiel Kayser, MD [address] [place],  [place] [address]  Chief Complaint  Patient presents with   Establish Care    HPI: 67 year-old male who presents today to establish care he was previously followed by Dr. Eliberto Ivory for a personal history of BPH and hypogonadism.   He has a personal history of hypogonadism dating back to at least 2020, possibly earlier primarily bothered by energy levels and libido issues.    He was initially started on creams or gels and then later transitioned to Xyosted 100 mg weekly auto-injection pen in August.  Extensive record review today from Dr. Samuel Germany office.  His most recent PSA was 0.7 on 8/23.   He complains of weak stream at times but otherwise no other urinary issues. He is on Finasteride for BPH. He has a family history of prostate cancer (father and brother).   He notes erections issues. He has lost 17 lbs recently.     PMH: Past Medical History:  Diagnosis Date   Allergic rhinitis    Anginal pain ([facility])    Aortic atherosclerosis (HCC)    Carpal tunnel syndrome on both sides    Carpal tunnel syndrome on left    a.) s/p release   Chronic lower back pain    CKD (chronic kidney disease), stage III (HCC)    Colloid LEFT thyroid nodule    Depression    Diastolic dysfunction    a.) TTE 10/19/2020: EF >55%; mild LVH, mile BAE, mild RV enlargement; triv AR/TR/PR, mild MR; G1DD.   DOE (dyspnea on exertion)    GERD  (gastroesophageal reflux disease)    Hepatic steatosis    Hyperlipidemia    Hypertension    Hypogonadism in male    a.) s/p Testim implant in 2014. b.) uses exogenous TD testosterone   Hypothyroidism    Migraines    Occipital neuralgia    OSA on CPAP    Osteoarthritis    Pulmonary nodules    Renal cyst, left    T2DM (type 2 diabetes mellitus) ([facility])    Vitiligo    Wears hearing aid in both ears     Surgical History: Past Surgical History:  Procedure Laterality Date   APPENDECTOMY     CARPAL TUNNEL RELEASE Right 11/2003   CARPAL TUNNEL RELEASE Left 11/19/2017   Procedure: LEFT OPEN CARPAL TUNNEL RELEASE;  Surgeon: Leanor Kail, MD;  Location: [facility];  Service: Orthopedics;  Laterality: Left;  Diabetic - oral meds Sleep Apnea   CHONDROPLASTY Right 03/22/2020   Procedure: CHONDROPLASTY;  Surgeon: Corky Mull, MD;  Location: [facility];  Service: Orthopedics;  Laterality: Right;  ABRASION CHONDROPLASTY   COLONOSCOPY     2/91, 6/00, 01/04, 7/08, 10/10, 12/13, 02/26/17   COLONOSCOPY WITH PROPOFOL N/A 03/07/2020   Procedure: COLONOSCOPY WITH PROPOFOL;  Surgeon: Lesly Rubenstein, MD;  Location: [facility] ENDOSCOPY;  Service: Endoscopy;  Laterality: N/A;   ESOPHAGOGASTRODUODENOSCOPY     2/91, 1/95, 6/00,  1/04, 7/08, 10/10   ESOPHAGOGASTRODUODENOSCOPY  03/07/2020   Procedure: ESOPHAGOGASTRODUODENOSCOPY (EGD);  Surgeon: Regis Bill, MD;  Location[facility];  Service: Endoscopy;;   EXCISION NEUROMA Left 06/2007   foot  had this done twice   FINGER SURGERY Bilateral    for congenital webbing   GANGLION CYST EXCISION Right 09/23/2019   Procedure: REMOVAL GANGLION OF FLEXOR SHEATH RIGHT LONG FINGER;  Surgeon: Christena Flake, MD;  Location: [facility];  Service: Orthopedics;  Laterality: Right;   IRRIGATION AND DEBRIDEMENT FOOT Left 11/09/2018   Procedure: IRRIGATION AND DEBRIDEMENT FOOT;  Surgeon: Linus Galas, DPM;  Location: [facility];  Service: Podiatry;  Laterality:  Left;   KNEE ARTHROSCOPY Right 03/22/2020   Procedure: RIGHT KNEE ARTHROSCOPY WITH PARTIAL MENISCECTOMY;  Surgeon: Christena Flake, MD;  Location: [facility];  Service: Orthopedics;  Laterality: Right;   KNEE ARTHROSCOPY Right 05/25/2021   Procedure: RIGHT KNEE ARTHROSCOPY WITH DEBRIDEMENT AND PARTIAL MEDIAL MENISCECTOMY;  Surgeon: Christena Flake, MD;  Location: [facility];  Service: Orthopedics;  Laterality: Right;   KNEE ARTHROSCOPY WITH MENISCAL REPAIR Right 03/22/2020   Procedure: KNEE ARTHROSCOPY WITH MENISCAL REPAIR;  Surgeon: Christena Flake, MD;  Location: [facility];  Service: Orthopedics;  Laterality: Right;   LASIK Bilateral    MANDIBLE SURGERY     in late 30s, fo overbite   PROSTATE BIOPSY  12/2015   ROTATOR CUFF REPAIR Left 02/2005   SHOULDER ARTHROSCOPY WITH SUBACROMIAL DECOMPRESSION AND OPEN ROTATOR C Right 07/16/2019   Procedure: Right shoulder arthroscopy with debridement, decompression, possible rotator cuff repair, and probable biceps tenodesis;  Surgeon: Christena Flake, MD;  Location: [facility];  Service: Orthopedics;  Laterality: Right;   THYROIDECTOMY, PARTIAL     TONSILLECTOMY     and adenoidectomy    Home Medications:  Allergies as of 02/09/2022       Reactions   Lamictal [lamotrigine] Hives   Pravastatin Other (See Comments)   Muscle joint/pain.   Simvastatin Other (See Comments)   Muscle aches   Statins Other (See Comments)   Muscle/joint aches        Medication List        Accurate as of February 09, 2022 10:29 AM. If you have any questions, ask your nurse or doctor.          STOP taking these medications    HYDROcodone-acetaminophen 5-325 MG tablet Commonly known as: Norco Stopped by: Vanna Scotland, MD   metFORMIN 500 MG 24 hr tablet Commonly known as: GLUCOPHAGE-XR Stopped by: Vanna Scotland, MD   sertraline 50 MG tablet Commonly known as: ZOLOFT Stopped by: Vanna Scotland, MD       TAKE these medications    amLODipine 5 MG tablet Commonly known  as: NORVASC Take 5 mg by mouth daily.   amLODipine 10 MG tablet Commonly known as: NORVASC Take 1 tablet by mouth daily.   aspirin EC 81 MG tablet Take 81 mg by mouth in the morning.   buPROPion 150 MG 24 hr tablet Commonly known as: WELLBUTRIN XL Take 150 mg by mouth daily.   cyanocobalamin 1000 MCG tablet Commonly known as: VITAMIN B12 Take 1,000 mcg by mouth in the morning.   esomeprazole 20 MG capsule Commonly known as: NEXIUM Take 20 mg by mouth daily as needed (acid reflux/indigestion.).   finasteride 5 MG tablet Commonly known as: PROSCAR Take 5 mg by mouth every evening.   fluticasone 50 MCG/ACT nasal spray Commonly known as: FLONASE Place 1-2 sprays  into both nostrils daily as needed for allergies.   levothyroxine 125 MCG tablet Commonly known as: SYNTHROID Take 125 mcg by mouth daily before breakfast.   liothyronine 50 MCG tablet Commonly known as: CYTOMEL Take 50 mcg by mouth daily. What changed: Another medication with the same name was removed. Continue taking this medication, and follow the directions you see here. Changed by: Jacob Espy, MD   lisinopril 40 MG tablet Commonly known as: ZESTRIL Take 40 mg by mouth every evening.   loratadine 10 MG tablet Commonly known as: CLARITIN Take 10 mg by mouth daily as needed for allergies.   meloxicam 15 MG tablet Commonly known as: MOBIC Take 15 mg by mouth daily.   metoprolol succinate 50 MG 24 hr tablet Commonly known as: TOPROL-XL Take 50 mg by mouth in the morning. Take with or immediately following a meal.   naproxen sodium 220 MG tablet Commonly known as: ALEVE Take 220 mg by mouth 2 (two) times daily as needed (pain.).   Precision QID Test test strip Generic drug: glucose blood 1 each (1 strip total) once daily   Procysbi 300 MG Pack Generic drug: Cysteamine Bitartrate See admin instructions.   rosuvastatin 5 MG tablet Commonly known as: CRESTOR Take 5 mg by mouth daily.    sildenafil 20 MG tablet Commonly known as: REVATIO Take 2-5 tablets by mouth prior to intercourse What changed:  how to take this additional instructions Changed by: Jacob Espy, MD   Vitamin D3 125 MCG (5000 UT) Tabs Take 5,000 Units by mouth in the morning.   Xyosted 100 MG/0.5ML Soaj Generic drug: Testosterone Enanthate Inject 100 mg into the skin once a week. What changed: Another medication with the same name was added. Make sure you understand how and when to take each. Changed by: Jacob Espy, MD   Berdine Addison 100 MG/0.5ML Soaj Generic drug: Testosterone Enanthate Inject 0.5 mLs into the skin once a week. What changed: You were already taking a medication with the same name, and this prescription was added. Make sure you understand how and when to take each. Changed by: Jacob Espy, MD   zinc sulfate 220 (50 Zn) MG capsule Take 220 mg by mouth in the morning.        Allergies:  Allergies  Allergen Reactions   Lamictal [Lamotrigine] Hives   Pravastatin Other (See Comments)    Muscle joint/pain.   Simvastatin Other (See Comments)    Muscle aches   Statins Other (See Comments)    Muscle/joint aches     Social History:  reports that he has never smoked. He has never used smokeless tobacco. He reports current alcohol use of about 1.0 - 2.0 standard drink of alcohol per week. He reports that he does not use drugs.   Physical Exam: BP (!) 168/95 (BP Location: Left Arm, Patient Position: Sitting, Cuff Size: Large) Comment: white coat  Pulse 100   Ht 6' (1.829 m)   Wt 242 lb (109.8 kg)   BMI 32.82 kg/m   Constitutional:  Alert and oriented, No acute distress. HEENT: [place] AT, moist mucus membranes.  Trachea midline, no masses. GU: 30 gram prostate, no nodules Neurologic: Grossly intact, no focal deficits, moving all 4 extremities. Psychiatric: Normal mood and affect.  Assessment & Plan:    1. History of hypogonadism - Continue Xyosted.,  Has been doing  well on injectable medication and would like to continue the same dose and format of testosterone if clinically appropriate - We'll get labs  today and then see him every six months  2. Erectile Dysfunction - We'll order Sildenifil 20 mg.  Titrate up to 100 mg as needed, 1 hour prior to sexual intercourse.  3. BPH - Continue Finasteride   4. Family history of prostate cancer  - We'll continue to monitor his PSA on a Q6 month basis  - DRE annually   Return in about 6 months (around 08/10/2022) for PSA/ testosterone/ CBC with Larene Beach ([place]).  I have reviewed the above documentation for accuracy and completeness, and I agree with the above.   Jacob Espy, MD   [facility] [address], [place], [place] [address] [phone]

## 2022-02-10 LAB — TESTOSTERONE: Testosterone: 487 ng/dL (ref 264–916)

## 2022-02-12 IMAGING — MR MR KNEE*R* W/O CM
6 series · 40 of 40 positions shown · non-contrast
Comparison: MRI right knee

CLINICAL DATA: Surgery 4 months ago. Felt pop about 2 months ago.
Generalized ache.

EXAM:
MRI OF THE RIGHT KNEE WITHOUT CONTRAST
TECHNIQUE: Multiplanar, multisequence MR imaging of the knee was performed. No
intravenous contrast was administered.

[Series 3: T2 fat-sat · axial · 4.0mm · 0.50mm/px · z∈[-86,+84]mm · 8 of 35 slices shown (1 of 3)]
[im 1/35]
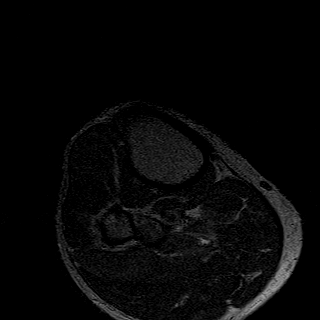
[im 5/35]
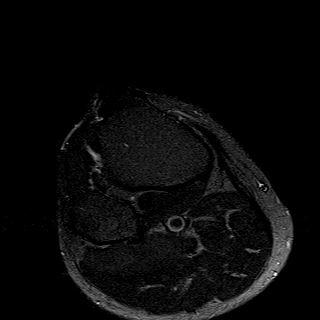
[im 10/35]
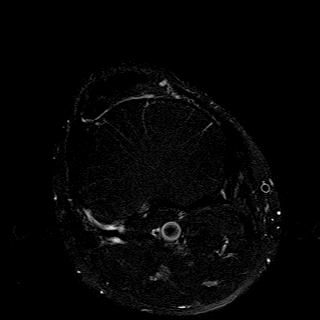
[im 15/35]
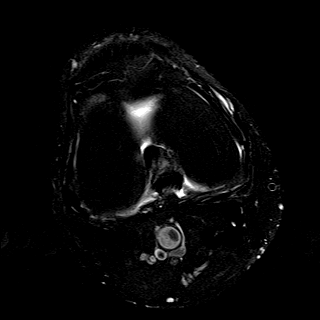
[im 20/35]
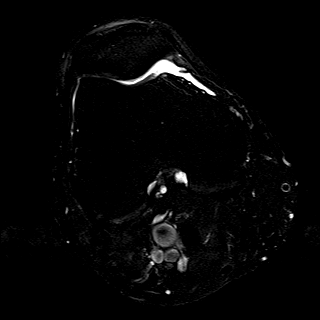
[im 25/35]
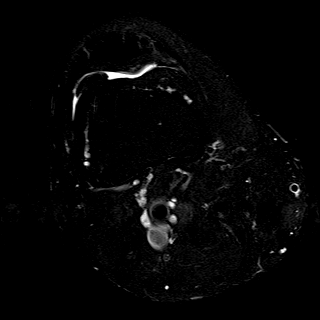
[im 30/35]
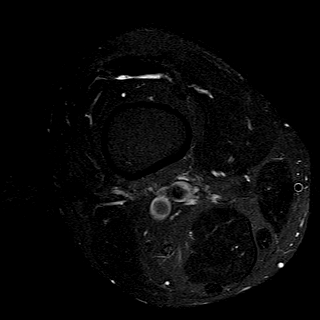
[im 35/35]
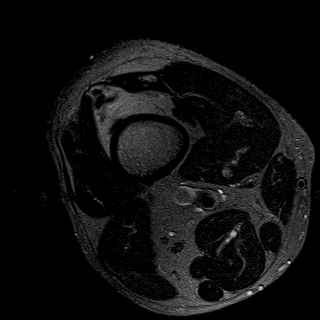

[Series 4: T1 · coronal · 4.0mm · 0.59mm/px · 6 of 33 slices shown]
[im 1/33]
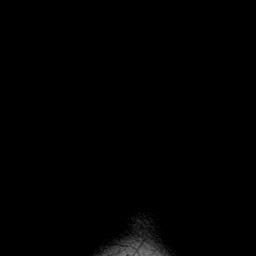
[im 7/33]
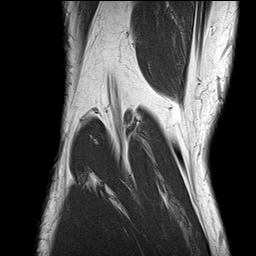
[im 13/33]
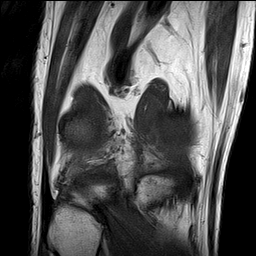
[im 20/33]
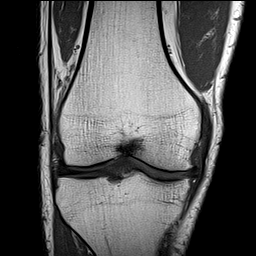
[im 26/33]
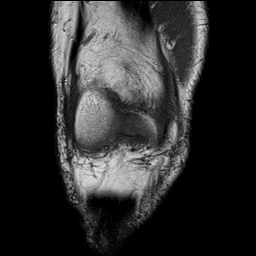
[im 33/33]
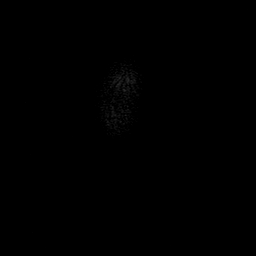

[Series 5: PD fat-sat · sagittal · 3.0mm · 0.59mm/px · 7 of 35 slices shown (1 of 2)]
[im 1/35]
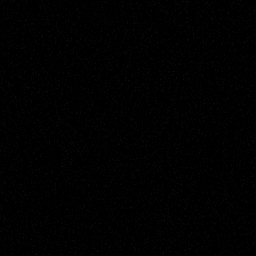
[im 6/35]
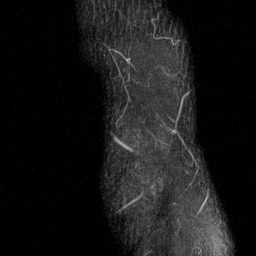
[im 12/35]
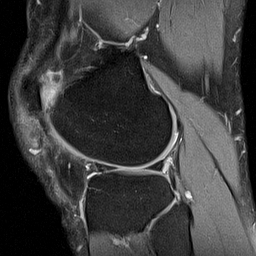
[im 18/35]
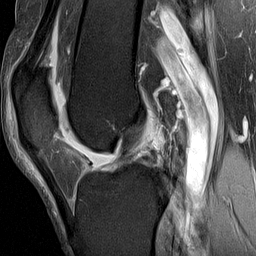
[im 23/35]
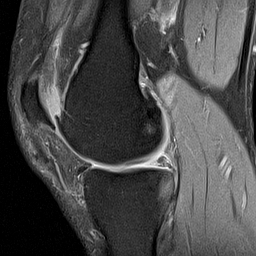
[im 29/35]
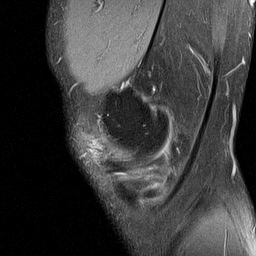
[im 35/35]
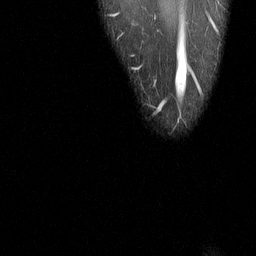

[Series 6: T2 fat-sat · coronal · 4.0mm · 0.59mm/px · 6 of 33 slices shown (2 of 3)]
[im 1/33]
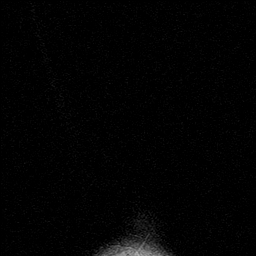
[im 7/33]
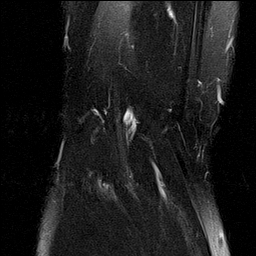
[im 13/33]
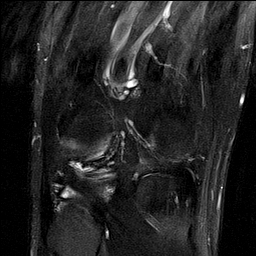
[im 20/33]
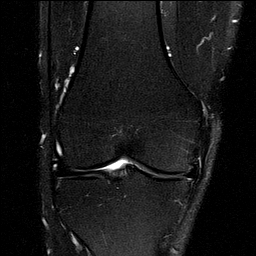
[im 26/33]
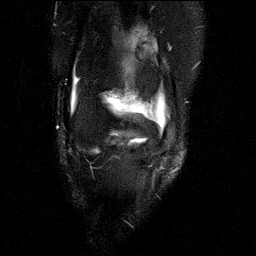
[im 33/33]
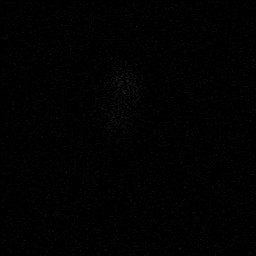

[Series 7: PD fat-sat · coronal · 4.0mm · 0.59mm/px · 6 of 33 slices shown (2 of 2)]
[im 1/33]
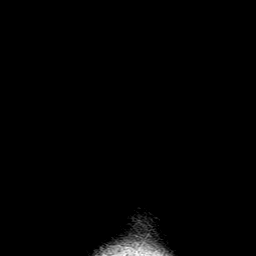
[im 7/33]
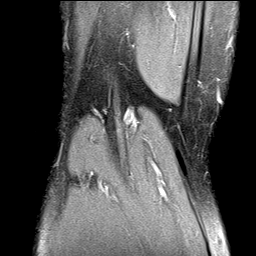
[im 13/33]
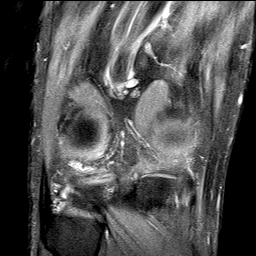
[im 20/33]
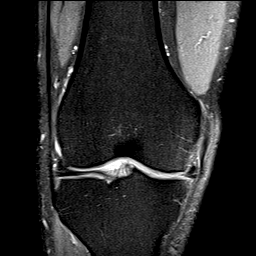
[im 26/33]
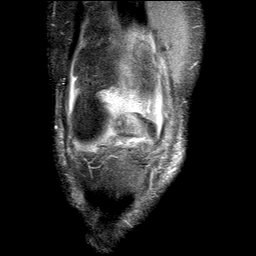
[im 33/33]
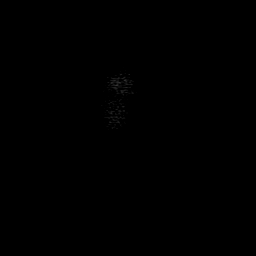

[Series 8: T2 fat-sat · sagittal · 3.0mm · 0.59mm/px · 7 of 35 slices shown (3 of 3)]
[im 1/35]
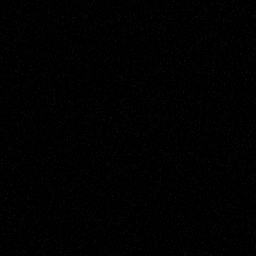
[im 6/35]
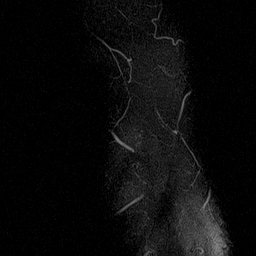
[im 12/35]
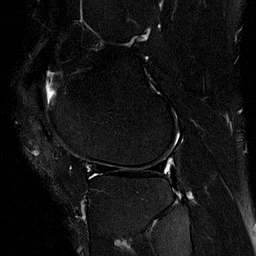
[im 18/35]
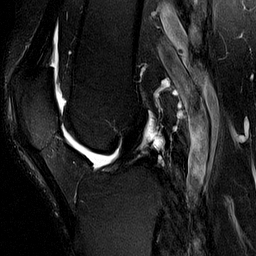
[im 23/35]
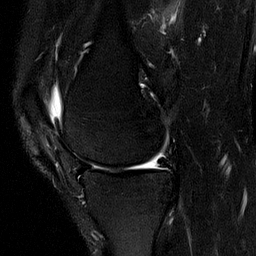
[im 29/35]
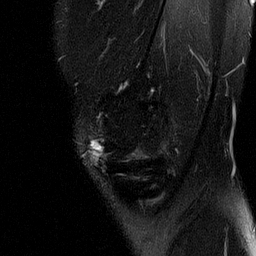
[im 35/35]
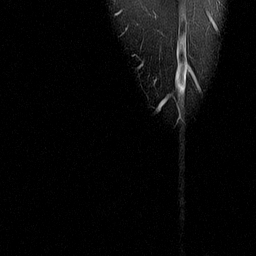

[40 of 40 positions shown; findings below may reference images not displayed]

FINDINGS: MENISCI

Medial meniscus: Interval posterior horn of the medial meniscus
partial meniscectomy. The previously seen oblique undersurface tear
of the central third of the meniscal triangle of the posterior horn
extending into the posterior segment of the body is again noted. No
new medial meniscal tear is seen. There is slight increase in
moderate extrusion of the posterior segment of the body of the
medial meniscus with meniscal tissue newly minimally extending into
the medial gutter (coronal series 7, image 16).

Lateral meniscus:  Intact.

LIGAMENTS

Cruciates: The ACL and PCL are intact.

Collaterals: The medial collateral ligament is intact. The fibular
collateral ligament, biceps femoris tendon, iliotibial band, and
popliteus tendon are intact.

CARTILAGE

Patellofemoral: High-grade partial or full-thickness cartilage loss
throughout the mid and inferior aspect of the medial and lateral
patellar facets. Full-thickness cartilage loss within portions of
the medial trochlea. High-grade thinning of the lateral trochlear
cartilage.

Medial: Moderate thinning of the weight-bearing medial femoral
condyle cartilage. Mild thinning of the medial aspect of the medial
tibial plateau cartilage.

Lateral:  Intact.

Joint: Tiny joint effusion.Normal Hoffa's fat pad. No plical
thickening.

Popliteal Fossa:  No Baker's cyst.

Extensor Mechanism: Mild proximal patellar and distal patellar
intermediate T2 signal and thickening tendinosis, similar to prior.
The distal quadriceps tendon insertion is intact.

Bones:  No acute fracture or dislocation.

Other: None.
IMPRESSION: 1. Interval partial meniscectomy of the posterior horn of the medial
meniscus. Old tear within the posterior horn of medial meniscus
extending into the posterior segment of the body of medial meniscus
is similar to prior. There is mildly increased moderate extrusion of
the posterior segment of the body of the medial meniscus with
meniscal tissue newly minimally extending into the medial gutter.
2. Moderate to high-grade patellofemoral and mild medial compartment
cartilage degenerative changes.
3. Unchanged chronic proximal distal patellar tendinosis.

## 2022-02-16 ENCOUNTER — Telehealth: Payer: Self-pay

## 2022-02-16 NOTE — Telephone Encounter (Signed)
Received fax from [facility] stating that Berdine Addison is not on patient's formulary, he received temporary supply this time. I called [facility] at [phone] and started PA-per notes past treatments include Testosterone cream 20% and Jatenzo 237 mg. Patient started on Xyosted in August 2023  Medication not approved, this time questions on file were not in the same criteria as the one from 2023. Patient has to try Testosterone cypionate or testosterone enanthate medication. Xyosted as a brand name is not covered until alternatives have been tried.

## 2022-02-19 NOTE — Telephone Encounter (Signed)
Appeal information sent to insurance today

## 2022-02-26 ENCOUNTER — Ambulatory Visit
Admission: RE | Admit: 2022-02-26 | Discharge: 2022-02-26 | Disposition: A | Discharge status: Home / Self Care | Payer: Medicare Other | Source: Ambulatory Visit | Attending: Infectious Diseases | Admitting: Infectious Diseases

## 2022-02-26 DIAGNOSIS — R413 Other amnesia: Secondary | ICD-10-CM | POA: Diagnosis present

## 2022-02-26 DIAGNOSIS — R202 Paresthesia of skin: Secondary | ICD-10-CM

## 2022-02-26 DIAGNOSIS — F3342 Major depressive disorder, recurrent, in full remission: Secondary | ICD-10-CM | POA: Diagnosis not present

## 2022-02-26 DIAGNOSIS — R42 Dizziness and giddiness: Secondary | ICD-10-CM | POA: Diagnosis present

## 2022-02-26 MED ORDER — GADOBUTROL 1 MMOL/ML IV SOLN
10.0000 mL | Freq: Once | INTRAVENOUS | Status: AC | PRN
  Administered 2022-02-26: 10 mL via INTRAVENOUS

## 2022-02-27 NOTE — Telephone Encounter (Signed)
PA approved 01/22/22-02/23/23- called patient but was not able to reach, spoke with Mrs Melander and advised of this information, ok per DPR on file.

## 2022-05-08 ENCOUNTER — Other Ambulatory Visit
Admission: RE | Admit: 2022-05-08 | Discharge: 2022-05-08 | Disposition: A | Discharge status: Home / Self Care | Payer: Medicare Other | Source: Ambulatory Visit | Attending: Infectious Diseases | Admitting: Infectious Diseases

## 2022-05-08 DIAGNOSIS — E785 Hyperlipidemia, unspecified: Secondary | ICD-10-CM | POA: Insufficient documentation

## 2022-05-08 DIAGNOSIS — N1831 Chronic kidney disease, stage 3a: Secondary | ICD-10-CM | POA: Diagnosis not present

## 2022-05-08 DIAGNOSIS — R609 Edema, unspecified: Secondary | ICD-10-CM | POA: Diagnosis not present

## 2022-05-08 DIAGNOSIS — F3342 Major depressive disorder, recurrent, in full remission: Secondary | ICD-10-CM | POA: Insufficient documentation

## 2022-05-08 DIAGNOSIS — F411 Generalized anxiety disorder: Secondary | ICD-10-CM | POA: Insufficient documentation

## 2022-05-08 DIAGNOSIS — E1169 Type 2 diabetes mellitus with other specified complication: Secondary | ICD-10-CM | POA: Insufficient documentation

## 2022-05-08 DIAGNOSIS — I25119 Atherosclerotic heart disease of native coronary artery with unspecified angina pectoris: Secondary | ICD-10-CM | POA: Diagnosis not present

## 2022-05-08 DIAGNOSIS — E039 Hypothyroidism, unspecified: Secondary | ICD-10-CM | POA: Insufficient documentation

## 2022-05-08 DIAGNOSIS — E1122 Type 2 diabetes mellitus with diabetic chronic kidney disease: Secondary | ICD-10-CM | POA: Insufficient documentation

## 2022-05-08 DIAGNOSIS — E291 Testicular hypofunction: Secondary | ICD-10-CM | POA: Diagnosis not present

## 2022-05-08 DIAGNOSIS — R42 Dizziness and giddiness: Secondary | ICD-10-CM | POA: Insufficient documentation

## 2022-05-08 DIAGNOSIS — I509 Heart failure, unspecified: Secondary | ICD-10-CM | POA: Insufficient documentation

## 2022-05-08 DIAGNOSIS — Z6835 Body mass index (BMI) 35.0-35.9, adult: Secondary | ICD-10-CM | POA: Diagnosis not present

## 2022-05-08 LAB — BRAIN NATRIURETIC PEPTIDE: B Natriuretic Peptide: 601.1 pg/mL — ABNORMAL HIGH (ref 0.0–100.0)

## 2022-05-16 ENCOUNTER — Other Ambulatory Visit: Payer: Self-pay | Admitting: Internal Medicine

## 2022-05-16 DIAGNOSIS — I2510 Atherosclerotic heart disease of native coronary artery without angina pectoris: Secondary | ICD-10-CM

## 2022-05-16 DIAGNOSIS — R0602 Shortness of breath: Secondary | ICD-10-CM

## 2022-06-04 ENCOUNTER — Other Ambulatory Visit
Admission: RE | Admit: 2022-06-04 | Discharge: 2022-06-04 | Disposition: A | Discharge status: Home / Self Care | Payer: Medicare Other | Source: Ambulatory Visit | Attending: Infectious Diseases | Admitting: Infectious Diseases

## 2022-06-04 DIAGNOSIS — R42 Dizziness and giddiness: Secondary | ICD-10-CM | POA: Diagnosis not present

## 2022-06-04 DIAGNOSIS — I25119 Atherosclerotic heart disease of native coronary artery with unspecified angina pectoris: Secondary | ICD-10-CM | POA: Insufficient documentation

## 2022-06-04 DIAGNOSIS — E039 Hypothyroidism, unspecified: Secondary | ICD-10-CM | POA: Insufficient documentation

## 2022-06-04 LAB — BRAIN NATRIURETIC PEPTIDE: B Natriuretic Peptide: 170.2 pg/mL — ABNORMAL HIGH (ref 0.0–100.0)

## 2022-06-12 ENCOUNTER — Telehealth (HOSPITAL_COMMUNITY): Payer: Self-pay | Admitting: *Deleted

## 2022-06-12 ENCOUNTER — Encounter (HOSPITAL_COMMUNITY): Payer: Self-pay

## 2022-06-12 NOTE — Telephone Encounter (Signed)
Attempted to call patient regarding upcoming cardiac MRI appointment. Left message on voicemail with name and callback number  Serigne Kubicek RN Navigator Cardiac Imaging [facility] [phone] Office [phone] Cell  

## 2022-06-12 NOTE — Telephone Encounter (Signed)
Reaching out to patient to offer assistance regarding upcoming cardiac imaging study; pt verbalizes understanding of appt date/time, parking situation and where to check in, and verified current allergies; name and call back number provided for further questions should they arise  Leotis Isham RN Navigator Cardiac Imaging  [facility] [phone] office [phone] cell  Patient denies claustrophobia or metal. 

## 2022-06-13 ENCOUNTER — Other Ambulatory Visit: Payer: Self-pay | Admitting: Internal Medicine

## 2022-06-13 ENCOUNTER — Ambulatory Visit
Admission: RE | Admit: 2022-06-13 | Discharge: 2022-06-13 | Disposition: A | Discharge status: Home / Self Care | Payer: Medicare Other | Source: Ambulatory Visit | Attending: Internal Medicine | Admitting: Internal Medicine

## 2022-06-13 DIAGNOSIS — R0602 Shortness of breath: Secondary | ICD-10-CM | POA: Diagnosis present

## 2022-06-13 DIAGNOSIS — I2510 Atherosclerotic heart disease of native coronary artery without angina pectoris: Secondary | ICD-10-CM | POA: Diagnosis present

## 2022-06-13 MED ORDER — GADOBUTROL 1 MMOL/ML IV SOLN
13.0000 mL | Freq: Once | INTRAVENOUS | Status: AC | PRN
  Administered 2022-06-13: 13 mL via INTRAVENOUS

## 2022-06-14 ENCOUNTER — Other Ambulatory Visit: Payer: Self-pay | Admitting: Orthopedic Surgery

## 2022-06-14 DIAGNOSIS — Z9889 Other specified postprocedural states: Secondary | ICD-10-CM

## 2022-06-14 DIAGNOSIS — S46002A Unspecified injury of muscle(s) and tendon(s) of the rotator cuff of left shoulder, initial encounter: Secondary | ICD-10-CM

## 2022-06-14 DIAGNOSIS — M25312 Other instability, left shoulder: Secondary | ICD-10-CM

## 2022-06-22 ENCOUNTER — Ambulatory Visit (INDEPENDENT_AMBULATORY_CARE_PROVIDER_SITE_OTHER): Payer: Medicare Other | Admitting: Urology

## 2022-06-22 VITALS — BP 176/96 | HR 59 | Ht 72.0 in | Wt 250.0 lb

## 2022-06-22 DIAGNOSIS — E291 Testicular hypofunction: Secondary | ICD-10-CM | POA: Diagnosis not present

## 2022-06-22 DIAGNOSIS — F5232 Male orgasmic disorder: Secondary | ICD-10-CM

## 2022-06-22 DIAGNOSIS — N529 Male erectile dysfunction, unspecified: Secondary | ICD-10-CM

## 2022-06-22 NOTE — Progress Notes (Signed)
Marcelle Overlie Plume,acting as a scribe for Vanna Scotland, MD.,have documented all relevant documentation on the behalf of Vanna Scotland, MD,as directed by  Vanna Scotland, MD while in the presence of Vanna Scotland, MD.  06/22/2022 10:12 AM   Orion Modest 09-09-1955 [id number]  Referring provider: Mick Sell, MD [address] [place],  [place] [address]  Chief Complaint  Patient presents with   Follow-up    HPI: 67 year-old male who returns today for follow up. He was last seen in January. He has a personal history of BPH and hypogonadism. He is managed on [place]. He was also prescribed sildenafil for his erectile dysfunction. However, his insurance did not approve the medication. His insurance recommended testosterone cypionate or enanthate.   Additionally, he has been dealing with erectile dysfunction, initially managed with Sildenafil, which was ineffective. He was switched to Cialis 20 mg daily, which has been effective in achieving erections but has not resolved his issue with delayed ejaculation. He attributes this to his medications, including Celexa and Trazodone. He has stopped taking Trazodone but continues on Celexa. He is concerned about his inability to reach climax and is seeking further advice.  He also reported that he has been experiencing early stages of heart failure, as diagnosed by Dr. Elijah Birk, who recommended weight loss and possibly starting Ozempic. He mentioned significant leg swelling, which was managed with diuretics.   PMH: Past Medical History:  Diagnosis Date   Allergic rhinitis    Anginal pain (HCC)    Aortic atherosclerosis (HCC)    Carpal tunnel syndrome on both sides    Carpal tunnel syndrome on left    a.) s/p release   Chronic lower back pain    CKD (chronic kidney disease), stage III (HCC)    Colloid LEFT thyroid nodule    Depression    Diastolic dysfunction    a.) TTE 10/19/2020: EF >55%; mild LVH, mile BAE, mild RV  enlargement; triv AR/TR/PR, mild MR; G1DD.   DOE (dyspnea on exertion)    GERD (gastroesophageal reflux disease)    Hepatic steatosis    Hyperlipidemia    Hypertension    Hypogonadism in male    a.) s/p Testim implant in 2014. b.) uses exogenous TD testosterone   Hypothyroidism    Migraines    Occipital neuralgia    OSA on CPAP    Osteoarthritis    Pulmonary nodules    Renal cyst, left    T2DM (type 2 diabetes mellitus) (HCC)    Vitiligo    Wears hearing aid in both ears     Surgical History: Past Surgical History:  Procedure Laterality Date   APPENDECTOMY     CARPAL TUNNEL RELEASE Right 11/2003   CARPAL TUNNEL RELEASE Left 11/19/2017   Procedure: LEFT OPEN CARPAL TUNNEL RELEASE;  Surgeon: Erin Sons, MD;  Location: [facility];  Service: Orthopedics;  Laterality: Left;  Diabetic - oral meds Sleep Apnea   CHONDROPLASTY Right 03/22/2020   Procedure: CHONDROPLASTY;  Surgeon: Christena Flake, MD;  Location: [facility];  Service: Orthopedics;  Laterality: Right;  ABRASION CHONDROPLASTY   COLONOSCOPY     2/91, 6/00, 01/04, 7/08, 10/10, 12/13, 02/26/17   COLONOSCOPY WITH PROPOFOL N/A 03/07/2020   Procedure: COLONOSCOPY WITH PROPOFOL;  Surgeon: Regis Bill, MD;  Location: [facility];  Service: Endoscopy;  Laterality: N/A;   ESOPHAGOGASTRODUODENOSCOPY     2/91, 1/95, 6/00, 1/04, 7/08, 10/10   ESOPHAGOGASTRODUODENOSCOPY  03/07/2020   Procedure: ESOPHAGOGASTRODUODENOSCOPY (EGD);  Surgeon: Regis Bill, MD;  Location: [facility];  Service: Endoscopy;;   EXCISION NEUROMA Left 06/2007   foot  had this done twice   FINGER SURGERY Bilateral    for congenital webbing   GANGLION CYST EXCISION Right 09/23/2019   Procedure: REMOVAL GANGLION OF FLEXOR SHEATH RIGHT LONG FINGER;  Surgeon: Christena Flake, MD;  Location: [facility];  Service: Orthopedics;  Laterality: Right;   IRRIGATION AND DEBRIDEMENT FOOT Left 11/09/2018   Procedure: IRRIGATION AND DEBRIDEMENT FOOT;   Surgeon: Linus Galas, DPM;  Location: [facility];  Service: Podiatry;  Laterality: Left;   KNEE ARTHROSCOPY Right 03/22/2020   Procedure: RIGHT KNEE ARTHROSCOPY WITH PARTIAL MENISCECTOMY;  Surgeon: Christena Flake, MD;  Location: [facility];  Service: Orthopedics;  Laterality: Right;   KNEE ARTHROSCOPY Right 05/25/2021   Procedure: RIGHT KNEE ARTHROSCOPY WITH DEBRIDEMENT AND PARTIAL MEDIAL MENISCECTOMY;  Surgeon: Christena Flake, MD;  Location: [facility];  Service: Orthopedics;  Laterality: Right;   KNEE ARTHROSCOPY WITH MENISCAL REPAIR Right 03/22/2020   Procedure: KNEE ARTHROSCOPY WITH MENISCAL REPAIR;  Surgeon: Christena Flake, MD;  Location: [facility];  Service: Orthopedics;  Laterality: Right;   LASIK Bilateral    MANDIBLE SURGERY     in late 30s, fo overbite   PROSTATE BIOPSY  12/2015   ROTATOR CUFF REPAIR Left 02/2005   SHOULDER ARTHROSCOPY WITH SUBACROMIAL DECOMPRESSION AND OPEN ROTATOR C Right 07/16/2019   Procedure: Right shoulder arthroscopy with debridement, decompression, possible rotator cuff repair, and probable biceps tenodesis;  Surgeon: Christena Flake, MD;  Location: [facility];  Service: Orthopedics;  Laterality: Right;   THYROIDECTOMY, PARTIAL     TONSILLECTOMY     and adenoidectomy    Home Medications:  Allergies as of 06/22/2022       Reactions   Lamictal [lamotrigine] Hives   Pravastatin Other (See Comments)   Muscle joint/pain.   Simvastatin Other (See Comments)   Muscle aches   Statins Other (See Comments)   Muscle/joint aches        Medication List        Accurate as of Jun 22, 2022 10:12 AM. If you have any questions, ask your nurse or doctor.          amLODipine 5 MG tablet Commonly known as: NORVASC Take 5 mg by mouth daily.   amLODipine 10 MG tablet Commonly known as: NORVASC Take 1 tablet by mouth daily.   aspirin EC 81 MG tablet Take 81 mg by mouth in the morning.   buPROPion 150 MG 24 hr tablet Commonly known as: WELLBUTRIN XL Take 150 mg by mouth  daily.   busPIRone 7.5 MG tablet Commonly known as: BUSPAR Take 1 tablet by mouth 2 (two) times daily.   citalopram 10 MG tablet Commonly known as: CELEXA Take 1 tablet by mouth daily.   cloNIDine 0.1 MG tablet Commonly known as: CATAPRES Take by mouth.   cyanocobalamin 1000 MCG tablet Commonly known as: VITAMIN B12 Take 1,000 mcg by mouth in the morning.   esomeprazole 20 MG capsule Commonly known as: NEXIUM Take 20 mg by mouth daily as needed (acid reflux/indigestion.).   finasteride 5 MG tablet Commonly known as: PROSCAR Take 5 mg by mouth every evening.   fluticasone 50 MCG/ACT nasal spray Commonly known as: FLONASE Place 1-2 sprays into both nostrils daily as needed for allergies.   hydrochlorothiazide 25 MG tablet Commonly known as: HYDRODIURIL Take 25 mg by mouth daily.   levothyroxine 125 MCG tablet  Commonly known as: SYNTHROID Take 125 mcg by mouth daily before breakfast.   liothyronine 50 MCG tablet Commonly known as: CYTOMEL Take 50 mcg by mouth daily.   lisinopril 40 MG tablet Commonly known as: ZESTRIL Take 40 mg by mouth every evening.   loratadine 10 MG tablet Commonly known as: CLARITIN Take 10 mg by mouth daily as needed for allergies.   meloxicam 15 MG tablet Commonly known as: MOBIC Take 15 mg by mouth daily.   metoprolol succinate 50 MG 24 hr tablet Commonly known as: TOPROL-XL Take 50 mg by mouth in the morning. Take with or immediately following a meal.   naproxen sodium 220 MG tablet Commonly known as: ALEVE Take 220 mg by mouth 2 (two) times daily as needed (pain.).   Precision QID Test test strip Generic drug: glucose blood 1 each (1 strip total) once daily   Procysbi 300 MG Pack Generic drug: Cysteamine Bitartrate See admin instructions.   rosuvastatin 5 MG tablet Commonly known as: CRESTOR Take 5 mg by mouth daily.   sildenafil 20 MG tablet Commonly known as: REVATIO Take 2-5 tablets by mouth prior to intercourse    spironolactone 25 MG tablet Commonly known as: ALDACTONE Take by mouth.   torsemide 10 MG tablet Commonly known as: DEMADEX Take 1 tablet by mouth daily.   traMADol 50 MG tablet Commonly known as: ULTRAM Take by mouth.   Vitamin D3 125 MCG (5000 UT) Tabs Take 5,000 Units by mouth in the morning.   Xyosted 100 MG/0.5ML Soaj Generic drug: Testosterone Enanthate Inject 100 mg into the skin once a week.   Xyosted 100 MG/0.5ML Soaj Generic drug: Testosterone Enanthate Inject 0.5 mLs into the skin once a week.   zinc sulfate 220 (50 Zn) MG capsule Take 220 mg by mouth in the morning.        Allergies:  Allergies  Allergen Reactions   Lamictal [Lamotrigine] Hives   Pravastatin Other (See Comments)    Muscle joint/pain.   Simvastatin Other (See Comments)    Muscle aches   Statins Other (See Comments)    Muscle/joint aches     Social History:  reports that he has never smoked. He has never used smokeless tobacco. He reports current alcohol use of about 1.0 - 2.0 standard drink of alcohol per week. He reports that he does not use drugs.   Physical Exam: BP (!) 176/96   Pulse (!) 59   Ht 6' (1.829 m)   Wt 250 lb (113.4 kg)   BMI 33.91 kg/m   Constitutional:  Alert and oriented, No acute distress. HEENT: [place] AT, moist mucus membranes.  Trachea midline, no masses. Neurologic: Grossly intact, no focal deficits, moving all 4 extremities. Psychiatric: Normal mood and affect.   Assessment & Plan:    1. Delayed ejaculation - Likely related to his Celexa - He is maintaining and achieving good erections now with Cialis 20 mg daily. He did not find any benefit from Viagra - Discuss the option of utilization of vibration during intercourse for additional nerve excitation. - He will also discuss his medication with Dr. Sampson Goon  1. Hypogonadism - He is still on Xyosted and has a few more months supply. We will likely transition him over to testosterone cypionate. - He  will likely need injection teaching - We will discuss this further in August given that he has enough supply  2. Erectile dysfunction - Continue Cialis 20 mg daily, as it is effective.   Return in  about 3 months (around 09/22/2022) for follow up .  [facility] [address], [address] [place], [place] [address] [phone]

## 2022-07-02 ENCOUNTER — Ambulatory Visit
Admission: RE | Admit: 2022-07-02 | Discharge: 2022-07-02 | Disposition: A | Discharge status: Home / Self Care | Payer: Medicare Other | Source: Ambulatory Visit | Attending: Orthopedic Surgery | Admitting: Orthopedic Surgery

## 2022-07-02 DIAGNOSIS — M25312 Other instability, left shoulder: Secondary | ICD-10-CM

## 2022-07-02 DIAGNOSIS — Z9889 Other specified postprocedural states: Secondary | ICD-10-CM

## 2022-07-02 DIAGNOSIS — S46002A Unspecified injury of muscle(s) and tendon(s) of the rotator cuff of left shoulder, initial encounter: Secondary | ICD-10-CM

## 2022-07-19 ENCOUNTER — Other Ambulatory Visit: Payer: Self-pay | Admitting: Urology

## 2022-08-07 ENCOUNTER — Encounter: Payer: Self-pay | Admitting: Urology

## 2022-08-10 ENCOUNTER — Ambulatory Visit: Payer: Medicare Other | Admitting: Urology

## 2022-08-21 ENCOUNTER — Other Ambulatory Visit: Payer: Self-pay | Admitting: Orthopedic Surgery

## 2022-08-23 ENCOUNTER — Other Ambulatory Visit: Payer: Self-pay | Admitting: Orthopedic Surgery

## 2022-08-23 ENCOUNTER — Telehealth: Payer: Self-pay | Admitting: Urology

## 2022-08-23 DIAGNOSIS — M25531 Pain in right wrist: Secondary | ICD-10-CM

## 2022-08-23 DIAGNOSIS — S66291A Other specified injury of extensor muscle, fascia and tendon of right thumb at wrist and hand level, initial encounter: Secondary | ICD-10-CM

## 2022-08-23 NOTE — Telephone Encounter (Signed)
Patient established care with Dr. Apolinar Junes and has been seen twice. His wife called today and requested that patient see Dr. Lonna Cobb for future appointments because patient would be more comfortable with a male provider. He has an appointment scheduled for 09/28/22. Is it ok to change providers. Please advise.

## 2022-08-24 NOTE — Telephone Encounter (Signed)
That is fine if [facility] agrees.  Jacob Scotland, MD

## 2022-08-25 ENCOUNTER — Other Ambulatory Visit: Payer: Medicare Other

## 2022-08-25 DIAGNOSIS — M25531 Pain in right wrist: Secondary | ICD-10-CM

## 2022-08-25 DIAGNOSIS — S66291A Other specified injury of extensor muscle, fascia and tendon of right thumb at wrist and hand level, initial encounter: Secondary | ICD-10-CM

## 2022-09-03 ENCOUNTER — Encounter: Payer: Self-pay | Admitting: Orthopedic Surgery

## 2022-09-03 NOTE — Anesthesia Preprocedure Evaluation (Addendum)
Anesthesia Evaluation  Patient identified by MRN, date of birth, ID band Patient awake    Reviewed: Allergy & Precautions, H&P , NPO status , Patient's Chart, lab work & pertinent test results  Airway Mallampati: IV  TM Distance: >3 FB Neck ROM: Full  Mouth opening: Limited Mouth Opening Comment: Thick neck, limited oral opening, would expect likely difficult airway if intubation needed, would consider videolaryngoscope if intubation needed. Dental  (+) Caps   Pulmonary sleep apnea    Pulmonary exam normal breath sounds clear to auscultation       Cardiovascular hypertension, + angina  + DOE  Normal cardiovascular exam Rhythm:Regular Rate:Normal  Cardiac MRI: (06/13/2022) IMPRESSION:  1. Normal LV size and function. LVEF 56%.   2. No LV LGE or scar.   3. No evidence for infiltrative disease.   4. Normal RV size and function.   5. No significant valvular abnormalities.   6. No evidence for amyloidosis.   Echocardiogram 2D complete: (05/29/2022) INTERPRETATION NORMAL LEFT VENTRICULAR SYSTOLIC FUNCTION WITH AN ESTIMATED EF = >55 % NORMAL RIGHT VENTRICULAR SYSTOLIC FUNCTION MILD TRICUSPID, MITRAL AND AORTIC VALVE INSUFFICIENCY NO VALVULAR STENOSIS MILD RV ENLARGEMENT MILD BIATRIAL ENLARGEMENT MILD LVH MILDLY DILATED AORTIC ROOT MEASURING 3.9 CM GLS: -17.2 %  NM Myocardial Perfusion SPECT multiple (stress and rest): (10/19/2020) IMPRESSION: Normal myocardial fusion scan no evidence of stress-induced myocardial ischemia ejection fraction 50% conclusion negative scan   IMPRESSION: 1. Coronary calcium score of 422. This was 78th percentile for age and sex matched control.   2. Normal coronary origin with right dominance.   3. Mild proximal RCA and D1 stenosis (25-49%).   4. Minimal LCX and proximal LAD stenosis (<25%).   5. CAD-RADS 2. Mild non-obstructive CAD (25-49%). Consider non-atherosclerotic causes of chest pain.  Consider preventive therapy and risk factor modification.   Electronically Signed: By: Debbe Odea M.D. On: 11/09/2021 16:03     Neuro/Psych  Headaches PSYCHIATRIC DISORDERS Anxiety Depression     Neuromuscular disease  negative psych ROS   GI/Hepatic Neg liver ROS,GERD  ,,  Endo/Other  diabetesHypothyroidism    Renal/GU Renal disease  negative genitourinary   Musculoskeletal  (+) Arthritis ,    Abdominal   Peds negative pediatric ROS (+)  Hematology negative hematology ROS (+)   Anesthesia Other Findings Hypothyroidism  Hyperlipidemia Hypertension  OSA on CPAP Wears hearing aid in both ears Depression Carpal tunnel syndrome on both sides GERD (gastroesophageal reflux disease) T2DM (type 2 diabetes mellitus)  Migraines Allergic rhinitis Aortic atherosclerosis  Anginal pain (HCC) DOE (dyspnea on exertion) CKD (chronic kidney disease), stage III Chronic lower back pain Colloid LEFT thyroid nodule  Hepatic steatosis Hypogonadism in male  Carpal tunnel syndrome on left Occipital neuralgia  Osteoarthritis Pulmonary nodules  Renal cyst, left Vitiligo  Diastolic dysfunction    Reproductive/Obstetrics negative OB ROS                              Anesthesia Physical Anesthesia Plan  ASA: 3  Anesthesia Plan: General ETT   Post-op Pain Management: Regional block   Induction: Intravenous  PONV Risk Score and Plan:   Airway Management Planned: Oral ETT  Additional Equipment:   Intra-op Plan:   Post-operative Plan: Extubation in OR  Informed Consent: I have reviewed the patients History and Physical, chart, labs and discussed the procedure including the risks, benefits and alternatives for the proposed anesthesia with the patient or authorized representative who  has indicated his/her understanding and acceptance.     Dental Advisory Given  Plan Discussed with: Anesthesiologist, CRNA and Surgeon  Anesthesia Plan  Comments: (Patient consented for risks of anesthesia including but not limited to:  - adverse reactions to medications - damage to eyes, teeth, lips or other oral mucosa - nerve damage due to positioning  - sore throat or hoarseness - Damage to heart, brain, nerves, lungs, other parts of body or loss of life  Patient voiced understanding.)         Anesthesia Quick Evaluation

## 2022-09-07 ENCOUNTER — Other Ambulatory Visit: Payer: Self-pay

## 2022-09-07 ENCOUNTER — Ambulatory Visit: Payer: Medicare Other | Admitting: Anesthesiology

## 2022-09-07 ENCOUNTER — Encounter: Payer: Self-pay | Admitting: Orthopedic Surgery

## 2022-09-07 ENCOUNTER — Encounter
Admission: RE | Disposition: A | Discharge status: Home / Self Care | Payer: Self-pay | Source: Ambulatory Visit | Attending: Orthopedic Surgery

## 2022-09-07 ENCOUNTER — Ambulatory Visit
Admission: RE | Admit: 2022-09-07 | Discharge: 2022-09-07 | Disposition: A | Discharge status: Home / Self Care | Payer: Medicare Other | Source: Ambulatory Visit | Attending: Orthopedic Surgery | Admitting: Orthopedic Surgery

## 2022-09-07 DIAGNOSIS — E1122 Type 2 diabetes mellitus with diabetic chronic kidney disease: Secondary | ICD-10-CM | POA: Insufficient documentation

## 2022-09-07 DIAGNOSIS — G4733 Obstructive sleep apnea (adult) (pediatric): Secondary | ICD-10-CM | POA: Insufficient documentation

## 2022-09-07 DIAGNOSIS — M25812 Other specified joint disorders, left shoulder: Secondary | ICD-10-CM | POA: Insufficient documentation

## 2022-09-07 DIAGNOSIS — I129 Hypertensive chronic kidney disease with stage 1 through stage 4 chronic kidney disease, or unspecified chronic kidney disease: Secondary | ICD-10-CM | POA: Insufficient documentation

## 2022-09-07 DIAGNOSIS — N183 Chronic kidney disease, stage 3 unspecified: Secondary | ICD-10-CM | POA: Diagnosis not present

## 2022-09-07 DIAGNOSIS — M75102 Unspecified rotator cuff tear or rupture of left shoulder, not specified as traumatic: Secondary | ICD-10-CM | POA: Insufficient documentation

## 2022-09-07 DIAGNOSIS — M19012 Primary osteoarthritis, left shoulder: Secondary | ICD-10-CM | POA: Diagnosis not present

## 2022-09-07 HISTORY — DX: Hypothyroidism, unspecified: E03.9

## 2022-09-07 HISTORY — DX: Failed or difficult intubation, initial encounter: T88.4XXA

## 2022-09-07 HISTORY — DX: Generalized anxiety disorder: F41.1

## 2022-09-07 HISTORY — DX: Nonrheumatic mitral (valve) insufficiency: I34.0

## 2022-09-07 LAB — GLUCOSE, CAPILLARY: Glucose-Capillary: 106 mg/dL — ABNORMAL HIGH (ref 70–99)

## 2022-09-07 SURGERY — SHOULDER ARTHROSCOPY WITH SUBACROMIAL DECOMPRESSION AND DISTAL CLAVICLE EXCISION
Anesthesia: General | Site: Shoulder | Laterality: Left

## 2022-09-07 MED ORDER — ONDANSETRON 4 MG PO TBDP: 4.0000 mg | ORAL_TABLET | Freq: Three times a day (TID) | ORAL | 0 refills | Status: DC | PRN

## 2022-09-07 MED ORDER — CEFAZOLIN SODIUM-DEXTROSE 2-4 GM/100ML-% IV SOLN
2.0000 g | INTRAVENOUS | Status: AC
  Administered 2022-09-07: 2 g via INTRAVENOUS

## 2022-09-07 MED ORDER — GLYCOPYRROLATE 0.2 MG/ML IJ SOLN
INTRAMUSCULAR | Status: DC | PRN
  Administered 2022-09-07: .2 mg via INTRAVENOUS

## 2022-09-07 MED ORDER — MIDAZOLAM HCL 2 MG/2ML IJ SOLN: 2.0000 mg | Freq: Once | INTRAMUSCULAR | Status: DC

## 2022-09-07 MED ORDER — DEXAMETHASONE SODIUM PHOSPHATE 10 MG/ML IJ SOLN
INTRAMUSCULAR | Status: DC | PRN
Start: 2022-09-07 — End: 2022-09-07
  Administered 2022-09-07: 10 mg

## 2022-09-07 MED ORDER — PROPOFOL 10 MG/ML IV BOLUS
INTRAVENOUS | Status: DC | PRN
Start: 2022-09-07 — End: 2022-09-07
  Administered 2022-09-07: 200 mg via INTRAVENOUS

## 2022-09-07 MED ORDER — LACTATED RINGERS IR SOLN
Status: DC | PRN
  Administered 2022-09-07: 12000 mL
  Administered 2022-09-07: 6000 mL

## 2022-09-07 MED ORDER — EPHEDRINE SULFATE (PRESSORS) 50 MG/ML IJ SOLN
INTRAMUSCULAR | Status: DC | PRN
  Administered 2022-09-07 (×2): 5 mg via INTRAVENOUS
  Administered 2022-09-07: 10 mg via INTRAVENOUS
  Administered 2022-09-07: 5 mg via INTRAVENOUS
  Administered 2022-09-07: 10 mg via INTRAVENOUS
  Administered 2022-09-07: 5 mg via INTRAVENOUS

## 2022-09-07 MED ORDER — ASPIRIN 325 MG PO TBEC: 325.0000 mg | DELAYED_RELEASE_TABLET | Freq: Every day | ORAL | 0 refills | Status: AC

## 2022-09-07 MED ORDER — FENTANYL CITRATE (PF) 100 MCG/2ML IJ SOLN
100.0000 ug | Freq: Once | INTRAMUSCULAR | Status: AC
  Administered 2022-09-07: 50 ug via INTRAVENOUS

## 2022-09-07 MED ORDER — BUPIVACAINE HCL (PF) 0.25 % IJ SOLN
INTRAMUSCULAR | Status: DC | PRN
  Administered 2022-09-07: 20 mL via EPIDURAL

## 2022-09-07 MED ORDER — SUCCINYLCHOLINE CHLORIDE 200 MG/10ML IV SOSY
PREFILLED_SYRINGE | INTRAVENOUS | Status: DC | PRN
  Administered 2022-09-07: 100 mg via INTRAVENOUS

## 2022-09-07 MED ORDER — ACETAMINOPHEN 500 MG PO TABS: 1000.0000 mg | ORAL_TABLET | Freq: Three times a day (TID) | ORAL | 2 refills | Status: DC

## 2022-09-07 MED ORDER — LACTATED RINGERS IV SOLN
INTRAVENOUS | Status: DC | PRN
  Administered 2022-09-07: 4 mL

## 2022-09-07 MED ORDER — FENTANYL CITRATE (PF) 100 MCG/2ML IJ SOLN
INTRAMUSCULAR | Status: DC | PRN
  Administered 2022-09-07: 50 ug via INTRAVENOUS

## 2022-09-07 MED ORDER — LACTATED RINGERS IV SOLN: INTRAVENOUS | Status: DC

## 2022-09-07 MED ORDER — BUPIVACAINE LIPOSOME 1.3 % IJ SUSP
INTRAMUSCULAR | Status: DC | PRN
  Administered 2022-09-07: 10 mg

## 2022-09-07 MED ORDER — DEXAMETHASONE SODIUM PHOSPHATE 10 MG/ML IJ SOLN
INTRAMUSCULAR | Status: DC | PRN
Start: 2022-09-07 — End: 2022-09-07

## 2022-09-07 MED ORDER — MIDAZOLAM HCL 2 MG/2ML IJ SOLN
2.0000 mg | Freq: Once | INTRAMUSCULAR | Status: AC
  Administered 2022-09-07: 2 mg via INTRAVENOUS

## 2022-09-07 MED ORDER — ONDANSETRON HCL 4 MG/2ML IJ SOLN
INTRAMUSCULAR | Status: DC | PRN
  Administered 2022-09-07: 4 mg via INTRAVENOUS

## 2022-09-07 MED ORDER — OXYCODONE HCL 5 MG PO TABS: 5.0000 mg | ORAL_TABLET | ORAL | 0 refills | Status: DC | PRN

## 2022-09-07 MED ORDER — LIDOCAINE HCL (CARDIAC) PF 100 MG/5ML IV SOSY
PREFILLED_SYRINGE | INTRAVENOUS | Status: DC | PRN
  Administered 2022-09-07: 60 mg via INTRAVENOUS

## 2022-09-07 SURGICAL SUPPLY — 66 items
ADH SKN CLS APL DERMABOND .7 (GAUZE/BANDAGES/DRESSINGS) ×1
ADPR IRR PORT MULTIBAG TUBE (MISCELLANEOUS) ×1
ANCH SUT 2 SWLK 19.1 CLS EYLT (Anchor) ×2 IMPLANT
ANCH SUT 2.9 PUSHLOCK ANCH (Orthopedic Implant) ×1 IMPLANT
ANCH SUT 2X2.3 TAPE (Anchor) ×2 IMPLANT
ANCH SUT SLF PNCH 3 LD STRL LF (Anchor) ×1 IMPLANT
ANCHOR 2.3 SP SGL 1.2 XBRAID (Anchor) IMPLANT
ANCHOR FBRTK 2.6 TRIPLE LOAD (Anchor) IMPLANT
ANCHOR SWIVELOCK BIO 4.75X19.1 (Anchor) IMPLANT
APL PRP STRL LF DISP 70% ISPRP (MISCELLANEOUS) ×1
BLADE SHAVER 4.5X7 STR FR (MISCELLANEOUS) ×1 IMPLANT
BLADE SURG 15 STRL LF DISP TIS (BLADE) IMPLANT
BLADE SURG 15 STRL SS (BLADE) ×1
BUR BR 5.5 WIDE MOUTH (BURR) ×1 IMPLANT
CHLORAPREP W/TINT 26 (MISCELLANEOUS) ×1 IMPLANT
COOLER POLAR GLACIER W/PUMP (MISCELLANEOUS) ×1 IMPLANT
COVER LIGHT HANDLE UNIVERSAL (MISCELLANEOUS) ×2 IMPLANT
DERMABOND ADVANCED .7 DNX12 (GAUZE/BANDAGES/DRESSINGS) IMPLANT
DRAPE INCISE IOBAN 66X45 STRL (DRAPES) ×1 IMPLANT
DRAPE STERI 35X30 U-POUCH (DRAPES) ×1 IMPLANT
DRAPE U-SHAPE 48X52 POLY STRL (PACKS) ×1 IMPLANT
DRSG TEGADERM 4X4.75 (GAUZE/BANDAGES/DRESSINGS) ×3 IMPLANT
ELECT REM PT RETURN 9FT ADLT (ELECTROSURGICAL) ×1
ELECTRODE REM PT RTRN 9FT ADLT (ELECTROSURGICAL) IMPLANT
GAUZE SPONGE 4X4 12PLY STRL (GAUZE/BANDAGES/DRESSINGS) ×1 IMPLANT
GAUZE XEROFORM 1X8 LF (GAUZE/BANDAGES/DRESSINGS) ×1 IMPLANT
GLOVE SRG 8 PF TXTR STRL LF DI (GLOVE) ×3 IMPLANT
GLOVE SURG ENC MOIS LTX SZ7.5 (GLOVE) ×4 IMPLANT
GLOVE SURG UNDER POLY LF SZ8 (GLOVE) ×3
GOWN STRL REIN 2XL XLG LVL4 (GOWN DISPOSABLE) ×1 IMPLANT
GOWN STRL REUS W/ TWL LRG LVL3 (GOWN DISPOSABLE) ×3 IMPLANT
GOWN STRL REUS W/TWL LRG LVL3 (GOWN DISPOSABLE) ×3
GRAFT TISS 40X70 3 THK DERM (Tissue) IMPLANT
IV LACTATED RINGER IRRG 3000ML (IV SOLUTION) ×6
IV LR IRRIG 3000ML ARTHROMATIC (IV SOLUTION) ×4 IMPLANT
KIT STABILIZATION SHOULDER (MISCELLANEOUS) ×1 IMPLANT
KIT TURNOVER KIT A (KITS) ×1 IMPLANT
MANIFOLD NEPTUNE II (INSTRUMENTS) ×1 IMPLANT
MASK FACE SPIDER DISP (MASK) ×1 IMPLANT
MAT GRAY ABSORB FLUID 28X50 (MISCELLANEOUS) ×2 IMPLANT
NDL HD SCORPION MEGA LOADER (NEEDLE) IMPLANT
PACK ARTHROSCOPY SHOULDER (MISCELLANEOUS) ×1 IMPLANT
PAD ABD DERMACEA PRESS 5X9 (GAUZE/BANDAGES/DRESSINGS) ×2 IMPLANT
PAD WRAPON POLAR SHDR XLG (MISCELLANEOUS) ×1 IMPLANT
PASSER SUT FIRSTPASS SELF (INSTRUMENTS) IMPLANT
SET Y ADAPTER MULIT-BAG IRRIG (MISCELLANEOUS) ×1 IMPLANT
SLEEVE PROTECTION STRL DISP (MISCELLANEOUS) IMPLANT
SLING ULTRA II LG (MISCELLANEOUS) IMPLANT
SPONGE T-LAP 18X18 ~~LOC~~+RFID (SPONGE) ×1 IMPLANT
SUT ETHILON 3-0 FS-10 30 BLK (SUTURE) ×1
SUT MNCRL 4-0 (SUTURE) ×1
SUT MNCRL 4-0 27XMFL (SUTURE) ×1
SUT VIC AB 0 CT1 36 (SUTURE) ×1 IMPLANT
SUT VIC AB 2-0 CT2 27 (SUTURE) IMPLANT
SUTURE EHLN 3-0 FS-10 30 BLK (SUTURE) ×1 IMPLANT
SUTURE MNCRL 4-0 27XMF (SUTURE) IMPLANT
SUTURE TAPE FIBERLINK 1.3 LOOP (SUTURE) IMPLANT
SUTURETAPE FIBERLINK 1.3 LOOP (SUTURE) ×1
SYSTEM IMPL TENODESIS LNT 2.9 (Orthopedic Implant) IMPLANT
TISSUE ARTHOFLEX THICK 3MM (Tissue) ×1 IMPLANT
TOWEL OR 17X26 4PK STRL BLUE (TOWEL DISPOSABLE) IMPLANT
TUBING CONNECTING 10 (TUBING) ×1 IMPLANT
TUBING INFLOW SET DBFLO PUMP (TUBING) ×1 IMPLANT
TUBING OUTFLOW SET DBLFO PUMP (TUBING) ×1 IMPLANT
WAND WEREWOLF FLOW 90D (MISCELLANEOUS) ×1 IMPLANT
WRAPON POLAR PAD SHDR XLG (MISCELLANEOUS) ×1

## 2022-09-07 NOTE — Progress Notes (Signed)
Assisted [place] ANMD with left, interscalene , ultrasound guided block. Side rails up, monitors on throughout procedure. See vital signs in flow sheet. Tolerated Procedure well.

## 2022-09-07 NOTE — Op Note (Signed)
SURGERY DATE: 09/07/2022    PRE-OP DIAGNOSIS:  1. Left subacromial impingement 2. Left biceps tendinopathy 3. Left rotator cuff tear 4. Left acromioclavicular joint arthritis  POST-OP DIAGNOSIS: 1. Left subacromial impingement 2. Left biceps tendinopathy 3. Left rotator cuff tear 4. Left acromioclavicular joint arthritis  PROCEDURES:  1. Left mini-open rotator cuff reconstruction with allograft interposition 2. Left arthroscopic biceps tenodesis 3. Left arthroscopic subacromial decompression 4. Left arthroscopic extensive debridement of shoulder (glenohumeral and subacromial spaces) 5. Left arthroscopic distal clavicle excision  SURGEON: Rosealee Albee, MD   ASSISTANT: OR Staff   ANESTHESIA: Gen with Exparel interscalene block   ESTIMATED BLOOD LOSS: 25cc   DRAINS:  none   TOTAL IV FLUIDS: per anesthesia      SPECIMENS: none   IMPLANTS:  - Arthrex 2.58mm PushLock x 1 - Arthrex 4.55mm SwiveLock x 2 - Arthrex Fibertak RC Triple Loaded x 1 - Iconix SPEED double loaded with 1.2 and 2.63mm tape x 2     OPERATIVE FINDINGS:  Examination under anesthesia: A careful examination under anesthesia was performed.  Passive range of motion was: FF: 150; ER at side: 45; ER in abduction: 90; IR in abduction: 45.  Anterior load shift: NT.  Posterior load shift: NT.  Sulcus in neutral: NT.  Sulcus in ER: NT.     Intra-operative findings: A thorough arthroscopic examination of the shoulder was performed.  The findings are: 1. Biceps tendon: Tendinopathy at the biceps anchor 2. Superior labrum: injected with surrounding synovitis 3. Posterior labrum and capsule: normal 4. Inferior capsule and inferior recess: normal 5. Glenoid cartilage surface: Normal 6. Supraspinatus attachment: full-thickness tear with retraction medial to the apex of the humeral head 7. Posterior rotator cuff attachment:  Full-thickness tear of the anterior supraspinatus 8. Humeral head articular cartilage: Focal  area of grade 2-3 degenerative change 9. Rotator interval: Severe synovitis 10: Subscapularis tendon:  Attachment intact on the lesser tuberosity 11. Anterior labrum: degenerative 12. IGHL: significant synovitis around IGHL   OPERATIVE REPORT:    Indications for procedure:  Jacob Lawson is a 67 y.o. male with a history of prior left shoulder arthroscopic surgery approximately 15 years ago.  He did well after that procedure until until he began to develop some pain approximately 6 months ago and then noted significant worsening of his symptoms approximately 3 months ago after he was working in his bulldozer and felt a sharp pain in the shoulder. Clinical exam and MRI were suggestive of large rotator cuff tear. Given these findings, we decided to proceed with surgical management. After discussion of risks, benefits, and alternatives to surgery, the patient elected to proceed.    Procedure in detail:   I identified the patient in the pre-operative holding area.  I marked the operative shoulder with my initials. I reviewed the risks and benefits of the proposed surgical intervention, and the patient wished to proceed. Anesthesia was then performed with an Exparel interscalene block.  The patient was transferred to the operative suite and placed in the beach chair position.     SCDs were placed on the lower extremities. Appropriate IV antibiotics were administered prior to incision. The operative upper extremity was then prepped and draped in standard fashion. A time out was performed confirming the correct extremity, correct patient, and correct procedure.    I then created a standard posterior portal with an 11 blade. The glenohumeral joint was easily entered with a blunt trochar and the arthroscope introduced. The findings of diagnostic arthroscopy  are described above.  A standard anterior portal was made.  I debrided degenerative tissue including the synovitic tissue about the rotator interval and  IGHL as well as the anterior and superior labrum.  Humeral head was also debrided such that there were stable cartilaginous edges. I then coagulated the inflamed synovium in these regions to obtain hemostasis and reduce the risk of post-operative swelling using an Arthrocare radiofrequency device.    I then turned my attention to the arthroscopic biceps tenodesis. The Loop n Tack technique was used to pass a FiberTape through the biceps in a locked fashion adjacent to the biceps anchor.  A hole for a 2.9 mm Arthrex PushLock was drilled in the bicipital groove just superior to the subscapularis tendon insertion.  The biceps tendon was then cut and the biceps anchor complex was debrided down to a stable base on the superior labrum.  The FiberTape was loaded onto the PushLock anchor and impacted into place into the previously drilled hole in the bicipital groove.  This appropriately secured the biceps into the bicipital groove and took it off of tension.   Next, the arthroscope was then introduced into the subacromial space. A direct lateral portal was created with an 11-blade after spinal needle localization. An extensive subacromial bursectomy and debridement was performed using a combination of the shaver and Arthrocare wand. The entire acromial undersurface was exposed and the CA ligament was subperiosteally elevated to expose the anterior acromial hook. A burr was used to create a flat anterior and lateral aspect of the acromion, converting it from a Type 2 to a Type 1 acromion. Care was made to keep the deltoid fascia intact.   I then turned my attention to the arthroscopic distal clavicle excision. I identified the acromioclavicular joint. Surrounding bursal tissue was debrided and the edges of the joint were identified. I used the 5.77mm barrel burr to remove the distal clavicle parallel to the edge of the acromion. I was able to fit two widths of the burr into the space between the distal clavicle and  acromion, signifying that I had removed ~2mm of distal clavicle. This was confirmed by viewing anteriorly and introducing a probe with measuring marks from the lateral portal. Hemostasis was achieved with an Arthrocare wand.   I attempted to mobilize the rotator cuff.  It could not be reduced over its native footprint.  Therefore, decision was made to perform rotator cuff reconstruction using an allograft and an interposition fashion.  A longitudinal incision from the anterolateral acromion ~6cm in length was made overlying the raphe between the anterior and middle heads of the deltoid. The raphe was identified and it was incised. The subacromial space was identified. Any remaining bursa was excised.    The arm was then internally rotated.  The rotator cuff footprint was cleared of soft tissue. A rongeur was used to gently decorticate the rotator cuff footprint to allow for improved healing.  The footprint was medialized slightly.  Two Iconix SPEED anchors were placed just lateral to the articular margin, one anteriorly and 1 centrally.  A third medial row anchor was placed posteriorly.  This was done using a triple loaded Arthrex FiberTak RC.  The rotator cuff was mobilized further using key elevators both superior and inferior to the tear.  All strands of suture from the medial row anchors were passed through the rotator cuff in an appropriate fashion.    Next, the area of the rotator cuff footprint that was uncovered was measured.  The ArthroFlex 3mm graft was cut appropriately.  The 8 strands from the anterior and middle medial row anchor as well as 2 strands from the posterior anchor (total of 5 pairs) were passed through the graft. One set of sutures from each of the 3 medial row anchors were then tied. This reduced the rotator cuff as well as the allograft to the medial row anchors.  1 set of sutures from the posterior anchor that was passed through the posterior aspect of the rotator cuff only was  also tied.  The suture was cut.  Next, 2 side-to-side stitches were placed from the posterior aspect of the allograft to the anterior infraspinatus.  These were tied, allowing continuity from the allograft to the infraspinatus. One strand of each of the remaining sutures (6 strands) was then passed through an Arthrex 4.75 mm SwiveLock anchor.  These were fixed on the left aspect of the greater tuberosity to serve as lateral row fixation while placing the sutures under appropriate tension.  1 was placed anteriorly and one was placed posteriorly. This allowed the graft to be compressed onto the bare footprint laterally. This construct was stable with external and internal rotation.   The wound was thoroughly irrigated.  The deltoid split was closed with 0 Vicryl.  The subdermal layer was closed with 2-0 Vicryl.  The skin was closed with 4-0 Monocryl and Dermabond. The portals were closed with 3-0 Nylon. Xeroform was applied to the incisions. A sterile dressing was applied, followed by a Polar Care sleeve and a SlingShot shoulder immobilizer/sling. The patient was awakened from anesthesia without difficulty and was transferred to the PACU in stable condition.    COMPLICATIONS: none   DISPOSITION: plan for discharge home after recovery in PACU    POSTOPERATIVE PLAN: Remain in sling (except hygiene and elbow/wrist/hand RoM exercises as instructed by PT) x 6 weeks and NWB for this time. PT to begin 3-4 days after surgery.  Use massive rotator cuff repair/allograft/superior capsular reconstruction rehab protocol.  ASA 325mg  daily x 2 weeks for DVT ppx.

## 2022-09-07 NOTE — Anesthesia Procedure Notes (Addendum)
Anesthesia Regional Block: Interscalene brachial plexus block   Pre-Anesthetic Checklist: , timeout performed,  Correct Patient, Correct Site, Correct Laterality,  Correct Procedure, Correct Position, site marked,  Risks and benefits discussed,  Surgical consent,  Pre-op evaluation,  At surgeon's request and post-op pain management  Laterality: Left  Prep: chloraprep       Needles:  Injection technique: Single-shot  Needle Type: Stimiplex     Needle Length: 10cm  Needle Gauge: 21     Additional Needles:   Procedures:,,,, ultrasound used (permanent image in chart),,    Narrative:  Start time: 09/07/2022 7:00 AM End time: 09/07/2022 7:14 AM Injection made incrementally with aspirations every 5 mL.  Performed by: Personally  Anesthesiologist: Marisue Humble, MD  Additional Notes: Functioning IV was confirmed and monitors applied. Ultrasound guidance: relevant anatomy identified, needle position confirmed, local anesthetic spread visualized around nerve(s)., vascular puncture avoided.  Image printed for medical record.  Negative aspiration and no paresthesias; incremental administration of local anesthetic. The patient tolerated the procedure well. Vitals signs recorded in RN notes. Also superficial cervical plexus block as field block

## 2022-09-07 NOTE — Anesthesia Procedure Notes (Signed)
Procedure Name: Intubation Date/Time: 09/07/2022 7:58 AM  Performed by: Barbette Hair, CRNAPre-anesthesia Checklist: Patient identified, Emergency Drugs available, Suction available, Patient being monitored and Timeout performed Patient Re-evaluated:Patient Re-evaluated prior to induction Oxygen Delivery Method: Circle system utilized Preoxygenation: Pre-oxygenation with 100% oxygen Induction Type: IV induction Ventilation: Oral airway inserted - appropriate to patient size and Mask ventilation without difficulty Laryngoscope Size: McGraph and 4 Grade View: Grade III Tube type: Oral Tube size: 7.5 mm Number of attempts: 2 Placement Confirmation: ETT inserted through vocal cords under direct vision, positive ETCO2, CO2 detector and breath sounds checked- equal and bilateral Secured at: 22 cm Tube secured with: Tape Dental Injury: Teeth and Oropharynx as per pre-operative assessment

## 2022-09-07 NOTE — H&P (Signed)
Paper H&P to be scanned into permanent record. H&P reviewed. No significant changes noted.  

## 2022-09-07 NOTE — Transfer of Care (Signed)
Immediate Anesthesia Transfer of Care Note  Patient: Jacob Lawson  Procedure(s) Performed: Left shoulder arthroscopy, mini-open rotator cuff repair with allograft augmentation, biceps tenodesis, and distal clavicle excision (Left: Shoulder)  Patient Location: PACU  Anesthesia Type: General ETT  Level of Consciousness: awake, alert  and patient cooperative  Airway and Oxygen Therapy: Patient Spontanous Breathing and Patient connected to supplemental oxygen  Post-op Assessment: Post-op Vital signs reviewed, Patient's Cardiovascular Status Stable, Respiratory Function Stable, Patent Airway and No signs of Nausea or vomiting  Post-op Vital Signs: Reviewed and stable  Complications: No notable events documented.

## 2022-09-07 NOTE — Anesthesia Postprocedure Evaluation (Signed)
Anesthesia Post Note  Patient: Jacob Lawson  Procedure(s) Performed: Left shoulder arthroscopy, mini-open rotator cuff repair with allograft augmentation, biceps tenodesis, and distal clavicle excision (Left: Shoulder)  Patient location during evaluation: PACU Anesthesia Type: General Level of consciousness: awake and alert Pain management: pain level controlled Vital Signs Assessment: post-procedure vital signs reviewed and stable Respiratory status: spontaneous breathing, nonlabored ventilation, respiratory function stable and patient connected to nasal cannula oxygen Cardiovascular status: blood pressure returned to baseline and stable Postop Assessment: no apparent nausea or vomiting Anesthetic complications: no   No notable events documented.   Last Vitals:  Vitals:   09/07/22 1145 09/07/22 1200  BP: (!) 142/83 (!) 146/76  Pulse: 82 (!) 52  Resp: 17 17  Temp:    SpO2: 97% 92%    Last Pain:  Vitals:   09/07/22 1200  TempSrc:   PainSc: 0-No pain                 Keyon Winnick C Phyllistine Domingos

## 2022-09-07 NOTE — Discharge Instructions (Signed)

## 2022-09-10 ENCOUNTER — Other Ambulatory Visit: Payer: Medicare Other

## 2022-09-28 ENCOUNTER — Ambulatory Visit: Payer: Medicare Other | Admitting: Urology

## 2022-09-28 ENCOUNTER — Ambulatory Visit (INDEPENDENT_AMBULATORY_CARE_PROVIDER_SITE_OTHER): Payer: Medicare Other | Admitting: Urology

## 2022-09-28 ENCOUNTER — Encounter: Payer: Self-pay | Admitting: Urology

## 2022-09-28 VITALS — BP 156/84 | HR 66 | Ht 72.0 in | Wt 249.0 lb

## 2022-09-28 DIAGNOSIS — N138 Other obstructive and reflux uropathy: Secondary | ICD-10-CM

## 2022-09-28 DIAGNOSIS — F5232 Male orgasmic disorder: Secondary | ICD-10-CM | POA: Diagnosis not present

## 2022-09-28 DIAGNOSIS — N529 Male erectile dysfunction, unspecified: Secondary | ICD-10-CM

## 2022-09-28 DIAGNOSIS — E291 Testicular hypofunction: Secondary | ICD-10-CM | POA: Diagnosis not present

## 2022-09-28 MED ORDER — XYOSTED 100 MG/0.5ML ~~LOC~~ SOAJ: 0.5000 mL | SUBCUTANEOUS | 3 refills | Status: DC

## 2022-09-28 NOTE — Progress Notes (Signed)
I, Maysun Anabel Bene, acting as a scribe for Riki Altes, MD., have documented all relevant documentation on the behalf of Riki Altes, MD, as directed by Riki Altes, MD while in the presence of Riki Altes, MD.  09/28/2022 12:53 PM   Jacob Lawson 09-19-1955 [id number]  Referring provider: Mick Sell, MD [address] [place],  [place] [address]  Chief Complaint  Patient presents with   Erectile Dysfunction   Urologic history: 1. BPH  2. Hypogonadism  3. Erectile dysfunction  HPI: Jacob Lawson is a 67 y.o. male presents for follow-up visit.   Previously seen by Dr. Apolinar Junes for BPH, hypogonadism, and erectile dysfunction, they requested a male provider. Since his last visit, he has stable lower urinary tract symptoms, which are not bothersome. He remains on [place] and has requested a refill.  His main complaint today is difficulty achieving ejaculation. He does get an adequate erection with tadalafil however, he has been unable to ejaculate with difficulty maintaining.  Dr. Apolinar Junes had discussed Celexa being a cause of his delayed ejaculation. He is currently no longer on Celexa but is on sertraline.    PMH: Past Medical History:  Diagnosis Date   Acquired hypothyroidism    Allergic rhinitis    Anginal pain (HCC)    Aortic atherosclerosis (HCC)    Carpal tunnel syndrome on both sides    Carpal tunnel syndrome on left    a.) s/p release   Chronic lower back pain    CKD (chronic kidney disease), stage III (HCC)    Colloid LEFT thyroid nodule    Depression    Diastolic dysfunction    a.) TTE 10/19/2020: EF >55%; mild LVH, mile BAE, mild RV enlargement; triv AR/TR/PR, mild MR; G1DD.   Difficult airway for intubation    DOE (dyspnea on exertion)    Generalized anxiety disorder    GERD (gastroesophageal reflux disease)    Hepatic steatosis    Hyperlipidemia    Hypertension    Hypogonadism in male    a.) s/p Testim implant in 2014.  b.) uses exogenous TD testosterone   Hypothyroidism    Migraines    Mild mitral regurgitation by prior echocardiogram    Occipital neuralgia    OSA on CPAP    OSA on CPAP    Osteoarthritis    Pulmonary nodules    Renal cyst, left    T2DM (type 2 diabetes mellitus) (HCC)    Vitiligo    Wears hearing aid in both ears     Surgical History: Past Surgical History:  Procedure Laterality Date   APPENDECTOMY     CARPAL TUNNEL RELEASE Right 11/2003   CARPAL TUNNEL RELEASE Left 11/19/2017   Procedure: LEFT OPEN CARPAL TUNNEL RELEASE;  Surgeon: Erin Sons, MD;  Location: [facility];  Service: Orthopedics;  Laterality: Left;  Diabetic - oral meds Sleep Apnea   CHONDROPLASTY Right 03/22/2020   Procedure: CHONDROPLASTY;  Surgeon: Christena Flake, MD;  Location: [facility];  Service: Orthopedics;  Laterality: Right;  ABRASION CHONDROPLASTY   COLONOSCOPY     2/91, 6/00, 01/04, 7/08, 10/10, 12/13, 02/26/17   COLONOSCOPY WITH PROPOFOL N/A 03/07/2020   Procedure: COLONOSCOPY WITH PROPOFOL;  Surgeon: Regis Bill, MD;  Location: [facility];  Service: Endoscopy;  Laterality: N/A;   ESOPHAGOGASTRODUODENOSCOPY     2/91, 1/95, 6/00, 1/04, 7/08, 10/10   ESOPHAGOGASTRODUODENOSCOPY  03/07/2020   Procedure: ESOPHAGOGASTRODUODENOSCOPY (EGD);  Surgeon: Regis Bill, MD;  Location: [facility];  Service: Endoscopy;;   EXCISION NEUROMA Left 06/2007   foot  had this done twice   FINGER SURGERY Bilateral    for congenital webbing   GANGLION CYST EXCISION Right 09/23/2019   Procedure: REMOVAL GANGLION OF FLEXOR SHEATH RIGHT LONG FINGER;  Surgeon: Christena Flake, MD;  Location: [facility];  Service: Orthopedics;  Laterality: Right;   IRRIGATION AND DEBRIDEMENT FOOT Left 11/09/2018   Procedure: IRRIGATION AND DEBRIDEMENT FOOT;  Surgeon: Linus Galas, DPM;  Location: [facility];  Service: Podiatry;  Laterality: Left;   KNEE ARTHROSCOPY Right 03/22/2020   Procedure: RIGHT KNEE ARTHROSCOPY WITH  PARTIAL MENISCECTOMY;  Surgeon: Christena Flake, MD;  Location: [facility];  Service: Orthopedics;  Laterality: Right;   KNEE ARTHROSCOPY Right 05/25/2021   Procedure: RIGHT KNEE ARTHROSCOPY WITH DEBRIDEMENT AND PARTIAL MEDIAL MENISCECTOMY;  Surgeon: Christena Flake, MD;  Location: [facility];  Service: Orthopedics;  Laterality: Right;   KNEE ARTHROSCOPY WITH MENISCAL REPAIR Right 03/22/2020   Procedure: KNEE ARTHROSCOPY WITH MENISCAL REPAIR;  Surgeon: Christena Flake, MD;  Location: [facility];  Service: Orthopedics;  Laterality: Right;   LASIK Bilateral    MANDIBLE SURGERY     in late 30s, fo overbite   PROSTATE BIOPSY  12/2015   ROTATOR CUFF REPAIR Left 02/2005   SHOULDER ARTHROSCOPY WITH SUBACROMIAL DECOMPRESSION AND OPEN ROTATOR C Right 07/16/2019   Procedure: Right shoulder arthroscopy with debridement, decompression, possible rotator cuff repair, and probable biceps tenodesis;  Surgeon: Christena Flake, MD;  Location: [facility];  Service: Orthopedics;  Laterality: Right;   THYROIDECTOMY, PARTIAL     TONSILLECTOMY     and adenoidectomy    Home Medications:  Allergies as of 09/28/2022       Reactions   Lamictal [lamotrigine] Hives   Pravastatin Other (See Comments)   Muscle joint/pain.   Simvastatin Other (See Comments)   Muscle aches   Statins Other (See Comments)   Muscle/joint aches        Medication List        Accurate as of September 28, 2022 12:53 PM. If you have any questions, ask your nurse or doctor.          acetaminophen 500 MG tablet Commonly known as: TYLENOL Take 2 tablets (1,000 mg total) by mouth every 8 (eight) hours.   amLODipine 5 MG tablet Commonly known as: NORVASC Take 5 mg by mouth daily.   amLODipine 10 MG tablet Commonly known as: NORVASC Take 1 tablet by mouth daily.   buPROPion 150 MG 24 hr tablet Commonly known as: WELLBUTRIN XL Take 150 mg by mouth daily.   busPIRone 7.5 MG tablet Commonly known as: BUSPAR Take 1 tablet by mouth 2 (two) times  daily.   citalopram 10 MG tablet Commonly known as: CELEXA Take 1 tablet by mouth daily.   cloNIDine 0.1 MG tablet Commonly known as: CATAPRES Take by mouth.   cyanocobalamin 1000 MCG tablet Commonly known as: VITAMIN B12 Take 1,000 mcg by mouth in the morning.   esomeprazole 20 MG capsule Commonly known as: NEXIUM Take 20 mg by mouth daily as needed (acid reflux/indigestion.).   finasteride 5 MG tablet Commonly known as: PROSCAR TAKE 1 TABLET BY MOUTH EVERY DAY   fluticasone 50 MCG/ACT nasal spray Commonly known as: FLONASE Place 1-2 sprays into both nostrils daily as needed for allergies.   hydrochlorothiazide 25 MG tablet Commonly known as: HYDRODIURIL Take 25 mg by mouth daily.   Jardiance 10 MG  Tabs tablet Generic drug: empagliflozin Take 10 mg by mouth daily.   levothyroxine 125 MCG tablet Commonly known as: SYNTHROID Take 125 mcg by mouth daily before breakfast.   liothyronine 50 MCG tablet Commonly known as: CYTOMEL Take 50 mcg by mouth daily.   lisinopril 40 MG tablet Commonly known as: ZESTRIL Take 40 mg by mouth every evening.   loratadine 10 MG tablet Commonly known as: CLARITIN Take 10 mg by mouth daily as needed for allergies.   metoprolol succinate 50 MG 24 hr tablet Commonly known as: TOPROL-XL Take 50 mg by mouth in the morning. Take with or immediately following a meal.   ondansetron 4 MG disintegrating tablet Commonly known as: ZOFRAN-ODT Take 1 tablet (4 mg total) by mouth every 8 (eight) hours as needed for nausea or vomiting.   oxyCODONE 5 MG immediate release tablet Commonly known as: Roxicodone Take 1-2 tablets (5-10 mg total) by mouth every 4 (four) hours as needed (pain).   Precision QID Test test strip Generic drug: glucose blood 1 each (1 strip total) once daily   Procysbi 300 MG Pack Generic drug: Cysteamine Bitartrate See admin instructions.   rosuvastatin 5 MG tablet Commonly known as: CRESTOR Take 5 mg by mouth  daily.   sildenafil 20 MG tablet Commonly known as: REVATIO Take 2-5 tablets by mouth prior to intercourse   spironolactone 25 MG tablet Commonly known as: ALDACTONE Take by mouth.   torsemide 10 MG tablet Commonly known as: DEMADEX Take 1 tablet by mouth daily.   traMADol 50 MG tablet Commonly known as: ULTRAM Take by mouth.   Vitamin D3 125 MCG (5000 UT) Tabs Take 5,000 Units by mouth in the morning.   Xyosted 100 MG/0.5ML Soaj Generic drug: Testosterone Enanthate Inject 100 mg into the skin once a week. What changed: Another medication with the same name was changed. Make sure you understand how and when to take each. Changed by: Riki Altes   Xyosted 100 MG/0.5ML Soaj Generic drug: Testosterone Enanthate Inject 0.5 mLs (100 mg total) into the skin once a week. What changed: how much to take Changed by: Verna Czech Philipp Callegari   zinc sulfate 220 (50 Zn) MG capsule Take 220 mg by mouth in the morning.        Allergies:  Allergies  Allergen Reactions   Lamictal [Lamotrigine] Hives   Pravastatin Other (See Comments)    Muscle joint/pain.   Simvastatin Other (See Comments)    Muscle aches   Statins Other (See Comments)    Muscle/joint aches     Family History: No family history on file.  Social History:  reports that he has never smoked. He has never used smokeless tobacco. He reports current alcohol use of about 1.0 - 2.0 standard drink of alcohol per week. He reports that he does not use drugs.   Physical Exam: BP (!) 156/84   Pulse 66   Ht 6' (1.829 m)   Wt 249 lb (112.9 kg)   BMI 33.77 kg/m   Constitutional:  Alert and oriented, No acute distress. HEENT: [place] AT Respiratory: Normal respiratory effort, no increased work of breathing. Psychiatric: Normal mood and affect.    Assessment & Plan:    1. Delayed ejaculation We discussed SSRI medications have a common side effect of delayed ejaculation, which is his most likely etiology. He will  discuss with Dr. Maryruth Bun possible alternatives.   2. Hypogonadism Testosterone level/ HH done today.  Xyosted refilled.   3. Erectile dysfunction Continue  tadalafil follow up 6 months with testosterone, PSA, and hematocrit.  I have reviewed the above documentation for accuracy and completeness, and I agree with the above.   Riki Altes, MD  [facility] [address] [place], [place] [address] [phone]

## 2022-09-29 LAB — HEMOGLOBIN AND HEMATOCRIT, BLOOD
Hematocrit: 53 % — ABNORMAL HIGH (ref 37.5–51.0)
Hemoglobin: 16.8 g/dL (ref 13.0–17.7)

## 2022-09-29 LAB — TESTOSTERONE: Testosterone: 318 ng/dL (ref 264–916)

## 2022-09-30 ENCOUNTER — Encounter: Payer: Self-pay | Admitting: Urology

## 2022-10-29 ENCOUNTER — Other Ambulatory Visit
Admission: RE | Admit: 2022-10-29 | Discharge: 2022-10-29 | Disposition: A | Discharge status: Home / Self Care | Payer: Medicare Other | Source: Ambulatory Visit | Attending: Sports Medicine | Admitting: Sports Medicine

## 2022-10-29 DIAGNOSIS — M25512 Pain in left shoulder: Secondary | ICD-10-CM | POA: Insufficient documentation

## 2022-10-29 DIAGNOSIS — M75122 Complete rotator cuff tear or rupture of left shoulder, not specified as traumatic: Secondary | ICD-10-CM | POA: Insufficient documentation

## 2022-10-29 LAB — SYNOVIAL CELL COUNT + DIFF, W/ CRYSTALS
Crystals, Fluid: NONE SEEN
Eosinophils-Synovial: 0 %
Lymphocytes-Synovial Fld: 4 %
Monocyte-Macrophage-Synovial Fluid: 4 %
Neutrophil, Synovial: 92 %
WBC, Synovial: 11332 /mm3 — ABNORMAL HIGH (ref 0–200)

## 2022-11-02 LAB — BODY FLUID CULTURE W GRAM STAIN: Culture: NO GROWTH

## 2022-11-05 ENCOUNTER — Ambulatory Visit
Admission: RE | Admit: 2022-11-05 | Discharge: 2022-11-05 | Disposition: A | Discharge status: Home / Self Care | Payer: Medicare Other | Source: Ambulatory Visit | Attending: Orthopedic Surgery | Admitting: Orthopedic Surgery

## 2022-11-05 ENCOUNTER — Other Ambulatory Visit: Payer: Self-pay | Admitting: Orthopedic Surgery

## 2022-11-05 DIAGNOSIS — M75122 Complete rotator cuff tear or rupture of left shoulder, not specified as traumatic: Secondary | ICD-10-CM

## 2022-11-07 ENCOUNTER — Other Ambulatory Visit: Payer: Self-pay | Admitting: Orthopedic Surgery

## 2022-11-09 ENCOUNTER — Encounter: Payer: Self-pay | Admitting: Anesthesiology

## 2022-11-09 ENCOUNTER — Encounter: Payer: Self-pay | Admitting: Orthopedic Surgery

## 2022-11-09 ENCOUNTER — Other Ambulatory Visit: Payer: Self-pay

## 2022-11-09 ENCOUNTER — Encounter
Admission: RE | Disposition: A | Discharge status: Home / Self Care | Payer: Self-pay | Source: Home / Self Care | Attending: Orthopedic Surgery

## 2022-11-09 ENCOUNTER — Ambulatory Visit
Admission: RE | Admit: 2022-11-09 | Discharge: 2022-11-09 | Disposition: A | Discharge status: Home / Self Care | Payer: Medicare Other | Attending: Orthopedic Surgery | Admitting: Orthopedic Surgery

## 2022-11-09 DIAGNOSIS — I700 Atherosclerosis of aorta: Secondary | ICD-10-CM | POA: Diagnosis not present

## 2022-11-09 DIAGNOSIS — Z538 Procedure and treatment not carried out for other reasons: Secondary | ICD-10-CM | POA: Insufficient documentation

## 2022-11-09 DIAGNOSIS — I4891 Unspecified atrial fibrillation: Secondary | ICD-10-CM | POA: Insufficient documentation

## 2022-11-09 DIAGNOSIS — E119 Type 2 diabetes mellitus without complications: Secondary | ICD-10-CM | POA: Insufficient documentation

## 2022-11-09 DIAGNOSIS — Z01818 Encounter for other preprocedural examination: Secondary | ICD-10-CM | POA: Insufficient documentation

## 2022-11-09 DIAGNOSIS — I10 Essential (primary) hypertension: Secondary | ICD-10-CM | POA: Insufficient documentation

## 2022-11-09 DIAGNOSIS — Z01812 Encounter for preprocedural laboratory examination: Secondary | ICD-10-CM

## 2022-11-09 LAB — CBC
HCT: 55.4 % — ABNORMAL HIGH (ref 39.0–52.0)
Hemoglobin: 17.9 g/dL — ABNORMAL HIGH (ref 13.0–17.0)
MCH: 26.7 pg (ref 26.0–34.0)
MCHC: 32.3 g/dL (ref 30.0–36.0)
MCV: 82.7 fL (ref 80.0–100.0)
Platelets: 201 10*3/uL (ref 150–400)
RBC: 6.7 MIL/uL — ABNORMAL HIGH (ref 4.22–5.81)
RDW: 15.9 % — ABNORMAL HIGH (ref 11.5–15.5)
WBC: 7.7 10*3/uL (ref 4.0–10.5)
nRBC: 0 % (ref 0.0–0.2)

## 2022-11-09 LAB — BASIC METABOLIC PANEL
Anion gap: 12 (ref 5–15)
BUN: 33 mg/dL — ABNORMAL HIGH (ref 8–23)
CO2: 24 mmol/L (ref 22–32)
Calcium: 9.4 mg/dL (ref 8.9–10.3)
Chloride: 98 mmol/L (ref 98–111)
Creatinine, Ser: 1.71 mg/dL — ABNORMAL HIGH (ref 0.61–1.24)
GFR, Estimated: 43 mL/min — ABNORMAL LOW (ref >60)
Glucose, Bld: 98 mg/dL (ref 70–99)
Potassium: 3.7 mmol/L (ref 3.5–5.1)
Sodium: 134 mmol/L — ABNORMAL LOW (ref 135–145)

## 2022-11-09 SURGERY — SHOULDER ARTHROSCOPY WITH DEBRIDEMENT AND BICEP TENDON REPAIR
Anesthesia: Choice | Site: Shoulder | Laterality: Left

## 2022-11-09 MED ORDER — CHLORHEXIDINE GLUCONATE 0.12 % MT SOLN
OROMUCOSAL | Status: AC
  Filled 2022-11-09: qty 15

## 2022-11-09 MED ORDER — EPINEPHRINE PF 1 MG/ML IJ SOLN
INTRAMUSCULAR | Status: AC
  Filled 2022-11-09: qty 4

## 2022-11-09 MED ORDER — CEFAZOLIN SODIUM-DEXTROSE 2-4 GM/100ML-% IV SOLN: 2.0000 g | INTRAVENOUS | Status: DC

## 2022-11-09 MED ORDER — ORAL CARE MOUTH RINSE: 15.0000 mL | Freq: Once | OROMUCOSAL | Status: AC

## 2022-11-09 MED ORDER — CEFAZOLIN SODIUM-DEXTROSE 2-4 GM/100ML-% IV SOLN
INTRAVENOUS | Status: AC
  Filled 2022-11-09: qty 100

## 2022-11-09 MED ORDER — CHLORHEXIDINE GLUCONATE 0.12 % MT SOLN
15.0000 mL | Freq: Once | OROMUCOSAL | Status: AC
  Administered 2022-11-09: 15 mL via OROMUCOSAL

## 2022-11-09 MED ORDER — SODIUM CHLORIDE 0.9 % IV SOLN: INTRAVENOUS | Status: DC

## 2022-11-09 SURGICAL SUPPLY — 68 items
ADAPTER IRRIG TUBE 2 SPIKE SOL (ADAPTER) ×2 IMPLANT
ADH SKN CLS APL DERMABOND .7 (GAUZE/BANDAGES/DRESSINGS)
ADPR TBG 2 SPK PMP STRL ASCP (ADAPTER) ×2
APL PRP STRL LF DISP 70% ISPRP (MISCELLANEOUS) ×1
BLADE OSCILLATING/SAGITTAL (BLADE)
BLADE SHAVER 4.5X7 STR FR (MISCELLANEOUS) ×1 IMPLANT
BLADE SW THK.38XMED LNG THN (BLADE) IMPLANT
BNDG ADH 2 X3.75 FABRIC TAN LF (GAUZE/BANDAGES/DRESSINGS) ×1 IMPLANT
BNDG ADH XL 3.75X2 STRCH LF (GAUZE/BANDAGES/DRESSINGS) ×1
BUR BR 5.5 WIDE MOUTH (BURR) IMPLANT
CANNULA PART THRD DISP 5.75X7 (CANNULA) IMPLANT
CANNULA PARTIAL THREAD 2X7 (CANNULA) ×1 IMPLANT
CANNULA TWIST IN 8.25X9CM (CANNULA) IMPLANT
CHLORAPREP W/TINT 26 (MISCELLANEOUS) ×1 IMPLANT
COOLER POLAR GLACIER W/PUMP (MISCELLANEOUS) ×1 IMPLANT
COVER LIGHT HANDLE STERIS (MISCELLANEOUS) ×1 IMPLANT
DERMABOND ADVANCED .7 DNX12 (GAUZE/BANDAGES/DRESSINGS) IMPLANT
DRAPE INCISE IOBAN 66X45 STRL (DRAPES) ×1 IMPLANT
DRAPE U-SHAPE 47X51 STRL (DRAPES) ×2 IMPLANT
ELECT REM PT RETURN 9FT ADLT (ELECTROSURGICAL) ×1
ELECTRODE REM PT RTRN 9FT ADLT (ELECTROSURGICAL) ×1 IMPLANT
GAUZE SPONGE 4X4 12PLY STRL (GAUZE/BANDAGES/DRESSINGS) ×1 IMPLANT
GAUZE XEROFORM 1X8 LF (GAUZE/BANDAGES/DRESSINGS) ×1 IMPLANT
GLOVE BIO SURGEON STRL SZ7.5 (GLOVE) ×2 IMPLANT
GLOVE BIOGEL PI IND STRL 8 (GLOVE) ×2 IMPLANT
GLOVE SURG SYN 8.0 (GLOVE) ×2 IMPLANT
GOWN STRL REUS W/ TWL LRG LVL3 (GOWN DISPOSABLE) ×2 IMPLANT
GOWN STRL REUS W/TWL LRG LVL3 (GOWN DISPOSABLE) ×2
GOWN STRL REUS W/TWL XL LVL4 (GOWN DISPOSABLE) ×1 IMPLANT
IV LACTATED RINGER IRRG 3000ML (IV SOLUTION) ×4
IV LR IRRIG 3000ML ARTHROMATIC (IV SOLUTION) ×4 IMPLANT
KIT STABILIZATION SHOULDER (MISCELLANEOUS) ×1 IMPLANT
KIT TURNOVER KIT A (KITS) ×1 IMPLANT
MANIFOLD NEPTUNE II (INSTRUMENTS) ×2 IMPLANT
MASK FACE SPIDER DISP (MASK) ×1 IMPLANT
MAT ABSORB FLUID 56X50 GRAY (MISCELLANEOUS) ×2 IMPLANT
NEEDLE HYPO 22X1.5 SAFETY MO (MISCELLANEOUS) ×1 IMPLANT
NEEDLE MAYO 6 CRC TAPER PT (NEEDLE) IMPLANT
NEEDLE SCORPION MULTI FIRE (NEEDLE) IMPLANT
PACK ARTHROSCOPY SHOULDER (MISCELLANEOUS) ×1 IMPLANT
PAD ABD DERMACEA PRESS 5X9 (GAUZE/BANDAGES/DRESSINGS) ×1 IMPLANT
PAD ARMBOARD 7.5X6 YLW CONV (MISCELLANEOUS) ×2 IMPLANT
PAD WRAPON POLAR SHDR XLG (MISCELLANEOUS) ×1 IMPLANT
PASSER SUT FIRSTPASS SELF (INSTRUMENTS) ×1 IMPLANT
SHAVER BLADE BONE CUTTER 5.5 (BLADE) IMPLANT
SLEEVE REMOTE CONTROL 5X12 (DRAPES) IMPLANT
SLING ULTRA II M (MISCELLANEOUS) ×1 IMPLANT
SPONGE T-LAP 18X18 ~~LOC~~+RFID (SPONGE) ×1 IMPLANT
STRAP SAFETY 5IN WIDE (MISCELLANEOUS) ×1 IMPLANT
SUT ETHILON 3-0 (SUTURE) ×1 IMPLANT
SUT LASSO 90 DEG SD STR (SUTURE) IMPLANT
SUT MNCRL 4-0 (SUTURE)
SUT MNCRL 4-0 27XMFL (SUTURE)
SUT PROLENE 0 CT 2 (SUTURE) IMPLANT
SUT TICRON 2-0 30IN 311381 (SUTURE) IMPLANT
SUT VIC AB 0 CT1 36 (SUTURE) IMPLANT
SUT VIC AB 2-0 CT2 27 (SUTURE) IMPLANT
SUTURE MNCRL 4-0 27XMF (SUTURE) IMPLANT
SUTURE TAPE 1.3 40 TPR END (SUTURE) IMPLANT
SUTURETAPE 1.3 40 TPR END (SUTURE)
SYR 10ML LL (SYRINGE) IMPLANT
TAPE MICROFOAM 4IN (TAPE) ×1 IMPLANT
TRAP FLUID SMOKE EVACUATOR (MISCELLANEOUS) ×1 IMPLANT
TUBE SET DOUBLEFLO INFLOW (TUBING) ×1 IMPLANT
TUBE SET DOUBLEFLO OUTFLOW (TUBING) ×1 IMPLANT
WAND WEREWOLF FLOW 90D (MISCELLANEOUS) ×1 IMPLANT
WATER STERILE IRR 500ML POUR (IV SOLUTION) ×1 IMPLANT
WRAPON POLAR PAD SHDR XLG (MISCELLANEOUS) ×1

## 2022-11-09 NOTE — H&P (Signed)
Surgery canceled due to new onset atrial fibrillation.  After discussion with anesthesia team, there would be high risk of stroke without further evaluation.  Patient will plan to have cardiology appointment next week.  We will plan to reschedule surgery after cardiology evaluation and clearance is obtained.

## 2022-11-09 NOTE — Plan of Care (Signed)
[facility] Tonsillectomy/Adenoidectomy, Postoperative PEDS care plan entered in error.

## 2022-11-09 NOTE — Progress Notes (Signed)
EKG completed and A-fib noted. No history noted and patient denies any A-fib history. Dr. Karlton Lemon notified and requested cardiology consult. Surgery canceled and patient to follow-up with cardiology next week. Attempted to call to setup appt but office already called for the day. Patient notified to follow-up on Monday.

## 2022-11-17 ENCOUNTER — Other Ambulatory Visit: Payer: Medicare Other

## 2022-11-19 ENCOUNTER — Other Ambulatory Visit: Payer: Self-pay | Admitting: Orthopedic Surgery

## 2022-11-20 ENCOUNTER — Encounter
Admission: AD | Disposition: A | Discharge status: Home Health | Payer: Self-pay | Source: Home / Self Care | Attending: Internal Medicine

## 2022-11-20 ENCOUNTER — Ambulatory Visit: Payer: Medicare Other | Admitting: Anesthesiology

## 2022-11-20 ENCOUNTER — Encounter: Payer: Self-pay | Admitting: Orthopedic Surgery

## 2022-11-20 ENCOUNTER — Ambulatory Visit: Payer: Medicare Other

## 2022-11-20 ENCOUNTER — Inpatient Hospital Stay
Admission: AD | Admit: 2022-11-20 | Discharge: 2022-11-23 | DRG: 512 | Disposition: A | Discharge status: Home Health | Payer: Medicare Other | Attending: Internal Medicine | Admitting: Internal Medicine

## 2022-11-20 ENCOUNTER — Other Ambulatory Visit: Payer: Self-pay

## 2022-11-20 DIAGNOSIS — Z6832 Body mass index (BMI) 32.0-32.9, adult: Secondary | ICD-10-CM | POA: Diagnosis not present

## 2022-11-20 DIAGNOSIS — Z8042 Family history of malignant neoplasm of prostate: Secondary | ICD-10-CM | POA: Diagnosis not present

## 2022-11-20 DIAGNOSIS — B9689 Other specified bacterial agents as the cause of diseases classified elsewhere: Secondary | ICD-10-CM | POA: Diagnosis not present

## 2022-11-20 DIAGNOSIS — Z888 Allergy status to other drugs, medicaments and biological substances status: Secondary | ICD-10-CM

## 2022-11-20 DIAGNOSIS — T884XXA Failed or difficult intubation, initial encounter: Secondary | ICD-10-CM

## 2022-11-20 DIAGNOSIS — Z886 Allergy status to analgesic agent status: Secondary | ICD-10-CM

## 2022-11-20 DIAGNOSIS — E1122 Type 2 diabetes mellitus with diabetic chronic kidney disease: Secondary | ICD-10-CM | POA: Diagnosis present

## 2022-11-20 DIAGNOSIS — M009 Pyogenic arthritis, unspecified: Secondary | ICD-10-CM | POA: Diagnosis present

## 2022-11-20 DIAGNOSIS — Z7984 Long term (current) use of oral hypoglycemic drugs: Secondary | ICD-10-CM

## 2022-11-20 DIAGNOSIS — E785 Hyperlipidemia, unspecified: Secondary | ICD-10-CM | POA: Diagnosis present

## 2022-11-20 DIAGNOSIS — I10 Essential (primary) hypertension: Secondary | ICD-10-CM | POA: Insufficient documentation

## 2022-11-20 DIAGNOSIS — M65912 Unspecified synovitis and tenosynovitis, left shoulder: Secondary | ICD-10-CM | POA: Diagnosis present

## 2022-11-20 DIAGNOSIS — N400 Enlarged prostate without lower urinary tract symptoms: Secondary | ICD-10-CM | POA: Diagnosis present

## 2022-11-20 DIAGNOSIS — Z800 Family history of malignant neoplasm of digestive organs: Secondary | ICD-10-CM | POA: Diagnosis not present

## 2022-11-20 DIAGNOSIS — K219 Gastro-esophageal reflux disease without esophagitis: Secondary | ICD-10-CM | POA: Diagnosis present

## 2022-11-20 DIAGNOSIS — Z79890 Hormone replacement therapy (postmenopausal): Secondary | ICD-10-CM | POA: Diagnosis not present

## 2022-11-20 DIAGNOSIS — Z79899 Other long term (current) drug therapy: Secondary | ICD-10-CM

## 2022-11-20 DIAGNOSIS — I700 Atherosclerosis of aorta: Secondary | ICD-10-CM | POA: Diagnosis present

## 2022-11-20 DIAGNOSIS — E669 Obesity, unspecified: Secondary | ICD-10-CM | POA: Diagnosis present

## 2022-11-20 DIAGNOSIS — F32A Depression, unspecified: Secondary | ICD-10-CM | POA: Diagnosis present

## 2022-11-20 DIAGNOSIS — E039 Hypothyroidism, unspecified: Secondary | ICD-10-CM | POA: Diagnosis present

## 2022-11-20 DIAGNOSIS — G4733 Obstructive sleep apnea (adult) (pediatric): Secondary | ICD-10-CM | POA: Diagnosis present

## 2022-11-20 DIAGNOSIS — K760 Fatty (change of) liver, not elsewhere classified: Secondary | ICD-10-CM | POA: Diagnosis present

## 2022-11-20 DIAGNOSIS — E119 Type 2 diabetes mellitus without complications: Secondary | ICD-10-CM

## 2022-11-20 DIAGNOSIS — M00012 Staphylococcal arthritis, left shoulder: Principal | ICD-10-CM | POA: Diagnosis present

## 2022-11-20 DIAGNOSIS — F411 Generalized anxiety disorder: Secondary | ICD-10-CM | POA: Diagnosis present

## 2022-11-20 DIAGNOSIS — Z66 Do not resuscitate: Secondary | ICD-10-CM | POA: Diagnosis present

## 2022-11-20 DIAGNOSIS — M00812 Arthritis due to other bacteria, left shoulder: Secondary | ICD-10-CM | POA: Diagnosis not present

## 2022-11-20 DIAGNOSIS — B957 Other staphylococcus as the cause of diseases classified elsewhere: Secondary | ICD-10-CM | POA: Diagnosis present

## 2022-11-20 DIAGNOSIS — I129 Hypertensive chronic kidney disease with stage 1 through stage 4 chronic kidney disease, or unspecified chronic kidney disease: Secondary | ICD-10-CM | POA: Diagnosis present

## 2022-11-20 DIAGNOSIS — N1831 Chronic kidney disease, stage 3a: Secondary | ICD-10-CM | POA: Diagnosis present

## 2022-11-20 DIAGNOSIS — I480 Paroxysmal atrial fibrillation: Secondary | ICD-10-CM | POA: Diagnosis present

## 2022-11-20 HISTORY — PX: SHOULDER ARTHROSCOPY WITH DEBRIDEMENT AND BICEP TENDON REPAIR: SHX5690

## 2022-11-20 HISTORY — DX: Failed or difficult intubation, initial encounter: T88.4XXA

## 2022-11-20 HISTORY — PX: ARTHOSCOPIC ROTAOR CUFF REPAIR: SHX5002

## 2022-11-20 LAB — CBC WITH DIFFERENTIAL/PLATELET
Abs Immature Granulocytes: 0.1 10*3/uL — ABNORMAL HIGH (ref 0.00–0.07)
Basophils Absolute: 0.1 10*3/uL (ref 0.0–0.1)
Basophils Relative: 1 %
Eosinophils Absolute: 0 10*3/uL (ref 0.0–0.5)
Eosinophils Relative: 0 %
HCT: 50.9 % (ref 39.0–52.0)
Hemoglobin: 16.2 g/dL (ref 13.0–17.0)
Immature Granulocytes: 1 %
Lymphocytes Relative: 11 %
Lymphs Abs: 0.8 10*3/uL (ref 0.7–4.0)
MCH: 26.7 pg (ref 26.0–34.0)
MCHC: 31.8 g/dL (ref 30.0–36.0)
MCV: 84 fL (ref 80.0–100.0)
Monocytes Absolute: 0.3 10*3/uL (ref 0.1–1.0)
Monocytes Relative: 4 %
Neutro Abs: 6.2 10*3/uL (ref 1.7–7.7)
Neutrophils Relative %: 83 %
Platelets: 160 10*3/uL (ref 150–400)
RBC: 6.06 MIL/uL — ABNORMAL HIGH (ref 4.22–5.81)
RDW: 15.3 % (ref 11.5–15.5)
WBC: 7.5 10*3/uL (ref 4.0–10.5)
nRBC: 0 % (ref 0.0–0.2)

## 2022-11-20 LAB — COMPREHENSIVE METABOLIC PANEL
ALT: 22 U/L (ref 0–44)
AST: 23 U/L (ref 15–41)
Albumin: 4 g/dL (ref 3.5–5.0)
Alkaline Phosphatase: 59 U/L (ref 38–126)
Anion gap: 9 (ref 5–15)
BUN: 19 mg/dL (ref 8–23)
CO2: 25 mmol/L (ref 22–32)
Calcium: 8.4 mg/dL — ABNORMAL LOW (ref 8.9–10.3)
Chloride: 102 mmol/L (ref 98–111)
Creatinine, Ser: 1.21 mg/dL (ref 0.61–1.24)
GFR, Estimated: >60 mL/min (ref >60)
Glucose, Bld: 126 mg/dL — ABNORMAL HIGH (ref 70–99)
Potassium: 3.7 mmol/L (ref 3.5–5.1)
Sodium: 136 mmol/L (ref 135–145)
Total Bilirubin: 0.7 mg/dL (ref 0.3–1.2)
Total Protein: 7.3 g/dL (ref 6.5–8.1)

## 2022-11-20 LAB — GLUCOSE, CAPILLARY
Glucose-Capillary: 101 mg/dL — ABNORMAL HIGH (ref 70–99)
Glucose-Capillary: 165 mg/dL — ABNORMAL HIGH (ref 70–99)
Glucose-Capillary: 167 mg/dL — ABNORMAL HIGH (ref 70–99)

## 2022-11-20 SURGERY — SHOULDER ARTHROSCOPY WITH DEBRIDEMENT AND BICEP TENDON REPAIR
Anesthesia: General | Site: Shoulder | Laterality: Left

## 2022-11-20 MED ORDER — SUCCINYLCHOLINE CHLORIDE 200 MG/10ML IV SOSY
PREFILLED_SYRINGE | INTRAVENOUS | Status: DC | PRN
  Administered 2022-11-20: 120 mg via INTRAVENOUS

## 2022-11-20 MED ORDER — MORPHINE SULFATE (PF) 4 MG/ML IV SOLN: 4.0000 mg | INTRAVENOUS | Status: DC | PRN

## 2022-11-20 MED ORDER — FENTANYL CITRATE (PF) 100 MCG/2ML IJ SOLN
INTRAMUSCULAR | Status: AC
  Filled 2022-11-20: qty 2

## 2022-11-20 MED ORDER — ZINC SULFATE 220 (50 ZN) MG PO CAPS
220.0000 mg | ORAL_CAPSULE | Freq: Every day | ORAL | Status: DC
  Administered 2022-11-21 – 2022-11-23 (×3): 220 mg via ORAL
  Filled 2022-11-20 (×3): qty 1

## 2022-11-20 MED ORDER — VANCOMYCIN HCL IN DEXTROSE 1-5 GM/200ML-% IV SOLN
1000.0000 mg | Freq: Two times a day (BID) | INTRAVENOUS | Status: AC
  Administered 2022-11-21 – 2022-11-23 (×4): 1000 mg via INTRAVENOUS
  Filled 2022-11-20 (×4): qty 200

## 2022-11-20 MED ORDER — ONDANSETRON HCL 4 MG/2ML IJ SOLN: 4.0000 mg | Freq: Four times a day (QID) | INTRAMUSCULAR | Status: DC | PRN

## 2022-11-20 MED ORDER — MIDAZOLAM HCL 2 MG/2ML IJ SOLN
2.0000 mg | Freq: Once | INTRAMUSCULAR | Status: AC | PRN
  Administered 2022-11-20: 2 mg via INTRAVENOUS

## 2022-11-20 MED ORDER — INSULIN ASPART 100 UNIT/ML IJ SOLN: 0.0000 [IU] | Freq: Three times a day (TID) | INTRAMUSCULAR | Status: DC

## 2022-11-20 MED ORDER — ACETAMINOPHEN 650 MG RE SUPP: 650.0000 mg | Freq: Four times a day (QID) | RECTAL | Status: DC | PRN

## 2022-11-20 MED ORDER — CEFAZOLIN SODIUM-DEXTROSE 2-4 GM/100ML-% IV SOLN
2.0000 g | INTRAVENOUS | Status: AC
  Administered 2022-11-20: 2 g via INTRAVENOUS

## 2022-11-20 MED ORDER — VITAMIN B-12 1000 MCG PO TABS
1000.0000 ug | ORAL_TABLET | Freq: Every day | ORAL | Status: DC
  Administered 2022-11-21 – 2022-11-23 (×3): 1000 ug via ORAL
  Filled 2022-11-20 (×3): qty 1

## 2022-11-20 MED ORDER — GLYCOPYRROLATE 0.2 MG/ML IJ SOLN
INTRAMUSCULAR | Status: DC | PRN
  Administered 2022-11-20: .2 mg via INTRAVENOUS

## 2022-11-20 MED ORDER — HYDROCHLOROTHIAZIDE 25 MG PO TABS: 25.0000 mg | ORAL_TABLET | Freq: Every day | ORAL | Status: DC

## 2022-11-20 MED ORDER — CEFAZOLIN SODIUM-DEXTROSE 2-4 GM/100ML-% IV SOLN
INTRAVENOUS | Status: AC
  Filled 2022-11-20: qty 100

## 2022-11-20 MED ORDER — ROSUVASTATIN CALCIUM 5 MG PO TABS
5.0000 mg | ORAL_TABLET | ORAL | Status: DC
  Administered 2022-11-21: 5 mg via ORAL
  Filled 2022-11-20: qty 1

## 2022-11-20 MED ORDER — ONDANSETRON HCL 4 MG/2ML IJ SOLN: 4.0000 mg | Freq: Once | INTRAMUSCULAR | Status: DC | PRN

## 2022-11-20 MED ORDER — BUPIVACAINE HCL (PF) 0.5 % IJ SOLN
INTRAMUSCULAR | Status: DC | PRN
  Administered 2022-11-20: 10 mL

## 2022-11-20 MED ORDER — LACTATED RINGERS IV SOLN: INTRAVENOUS | Status: DC | PRN

## 2022-11-20 MED ORDER — OXYCODONE HCL 5 MG/5ML PO SOLN: 5.0000 mg | Freq: Once | ORAL | Status: DC | PRN

## 2022-11-20 MED ORDER — VITAMIN D3 25 MCG (1000 UNIT) PO TABS
5000.0000 [IU] | ORAL_TABLET | Freq: Every day | ORAL | Status: DC
  Administered 2022-11-21 – 2022-11-23 (×3): 5000 [IU] via ORAL
  Filled 2022-11-20 (×5): qty 5

## 2022-11-20 MED ORDER — FENTANYL CITRATE (PF) 100 MCG/2ML IJ SOLN
25.0000 ug | INTRAMUSCULAR | Status: DC | PRN
Start: 2022-11-20 — End: 2022-11-20

## 2022-11-20 MED ORDER — SERTRALINE HCL 50 MG PO TABS
100.0000 mg | ORAL_TABLET | Freq: Every day | ORAL | Status: DC
  Administered 2022-11-21 – 2022-11-23 (×3): 100 mg via ORAL
  Filled 2022-11-20 (×2): qty 2

## 2022-11-20 MED ORDER — PHENYLEPHRINE 80 MCG/ML (10ML) SYRINGE FOR IV PUSH (FOR BLOOD PRESSURE SUPPORT)
PREFILLED_SYRINGE | INTRAVENOUS | Status: DC | PRN
  Administered 2022-11-20: 120 ug via INTRAVENOUS
  Administered 2022-11-20: 80 ug via INTRAVENOUS
  Administered 2022-11-20: 100 ug via INTRAVENOUS
  Administered 2022-11-20 (×2): 80 ug via INTRAVENOUS

## 2022-11-20 MED ORDER — ROCURONIUM BROMIDE 100 MG/10ML IV SOLN
INTRAVENOUS | Status: DC | PRN
  Administered 2022-11-20: 20 mg via INTRAVENOUS
  Administered 2022-11-20: 30 mg via INTRAVENOUS

## 2022-11-20 MED ORDER — METOPROLOL SUCCINATE ER 25 MG PO TB24: 50.0000 mg | ORAL_TABLET | Freq: Every morning | ORAL | Status: DC

## 2022-11-20 MED ORDER — HYDRALAZINE HCL 20 MG/ML IJ SOLN
5.0000 mg | Freq: Four times a day (QID) | INTRAMUSCULAR | Status: DC | PRN
Start: 2022-11-20 — End: 2022-11-23

## 2022-11-20 MED ORDER — SODIUM CHLORIDE 0.9 % IV SOLN: INTRAVENOUS | Status: DC

## 2022-11-20 MED ORDER — PHENYLEPHRINE HCL-NACL 20-0.9 MG/250ML-% IV SOLN
INTRAVENOUS | Status: AC
  Filled 2022-11-20: qty 250

## 2022-11-20 MED ORDER — SPIRONOLACTONE 25 MG PO TABS: 25.0000 mg | ORAL_TABLET | Freq: Every day | ORAL | Status: DC

## 2022-11-20 MED ORDER — SENNOSIDES-DOCUSATE SODIUM 8.6-50 MG PO TABS
1.0000 | ORAL_TABLET | Freq: Every evening | ORAL | Status: DC | PRN
Start: 2022-11-20 — End: 2022-11-23

## 2022-11-20 MED ORDER — PROPOFOL 10 MG/ML IV BOLUS
INTRAVENOUS | Status: AC
  Filled 2022-11-20: qty 40

## 2022-11-20 MED ORDER — ORAL CARE MOUTH RINSE: 15.0000 mL | OROMUCOSAL | Status: DC | PRN

## 2022-11-20 MED ORDER — CEFAZOLIN SODIUM-DEXTROSE 2-4 GM/100ML-% IV SOLN
2.0000 g | Freq: Three times a day (TID) | INTRAVENOUS | Status: AC
  Administered 2022-11-20: 2 g via INTRAVENOUS

## 2022-11-20 MED ORDER — LEVOTHYROXINE SODIUM 125 MCG PO TABS
125.0000 ug | ORAL_TABLET | Freq: Every day | ORAL | Status: DC
Start: 2022-11-21 — End: 2022-11-20
  Filled 2022-11-20: qty 1

## 2022-11-20 MED ORDER — FENTANYL CITRATE PF 50 MCG/ML IJ SOSY: 25.0000 ug | PREFILLED_SYRINGE | INTRAMUSCULAR | Status: AC | PRN

## 2022-11-20 MED ORDER — BUPIVACAINE LIPOSOME 1.3 % IJ SUSP
INTRAMUSCULAR | Status: DC | PRN
  Administered 2022-11-20: 20 mL

## 2022-11-20 MED ORDER — IRBESARTAN 150 MG PO TABS
75.0000 mg | ORAL_TABLET | Freq: Every day | ORAL | Status: DC
  Administered 2022-11-21 – 2022-11-23 (×3): 75 mg via ORAL
  Filled 2022-11-20 (×3): qty 1

## 2022-11-20 MED ORDER — SUGAMMADEX SODIUM 200 MG/2ML IV SOLN
INTRAVENOUS | Status: DC | PRN
  Administered 2022-11-20: 200 mg via INTRAVENOUS

## 2022-11-20 MED ORDER — CARVEDILOL 12.5 MG PO TABS
12.5000 mg | ORAL_TABLET | Freq: Two times a day (BID) | ORAL | Status: DC
  Administered 2022-11-21 – 2022-11-23 (×5): 12.5 mg via ORAL
  Filled 2022-11-20 (×4): qty 1

## 2022-11-20 MED ORDER — FINASTERIDE 5 MG PO TABS
5.0000 mg | ORAL_TABLET | Freq: Every day | ORAL | Status: DC
Start: 2022-11-20 — End: 2022-11-23
  Administered 2022-11-21 – 2022-11-23 (×4): 5 mg via ORAL
  Filled 2022-11-20 (×4): qty 1

## 2022-11-20 MED ORDER — OXYCODONE HCL 5 MG PO TABS: 5.0000 mg | ORAL_TABLET | Freq: Four times a day (QID) | ORAL | Status: DC | PRN

## 2022-11-20 MED ORDER — PHENYLEPHRINE HCL-NACL 20-0.9 MG/250ML-% IV SOLN
INTRAVENOUS | Status: DC | PRN
  Administered 2022-11-20: 25 ug/min via INTRAVENOUS

## 2022-11-20 MED ORDER — AMLODIPINE BESYLATE 5 MG PO TABS: 5.0000 mg | ORAL_TABLET | Freq: Every day | ORAL | Status: DC

## 2022-11-20 MED ORDER — ORAL CARE MOUTH RINSE: 15.0000 mL | Freq: Once | OROMUCOSAL | Status: AC

## 2022-11-20 MED ORDER — ACETAMINOPHEN 10 MG/ML IV SOLN: 1000.0000 mg | Freq: Once | INTRAVENOUS | Status: DC | PRN

## 2022-11-20 MED ORDER — VANCOMYCIN HCL IN DEXTROSE 1-5 GM/200ML-% IV SOLN
1000.0000 mg | Freq: Once | INTRAVENOUS | Status: AC
  Administered 2022-11-21: 1000 mg via INTRAVENOUS

## 2022-11-20 MED ORDER — CHLORHEXIDINE GLUCONATE 0.12 % MT SOLN
OROMUCOSAL | Status: AC
  Filled 2022-11-20: qty 15

## 2022-11-20 MED ORDER — RINGERS IRRIGATION IR SOLN
Status: DC | PRN
  Administered 2022-11-20: 21000 mL

## 2022-11-20 MED ORDER — ONDANSETRON HCL 4 MG/2ML IJ SOLN
INTRAMUSCULAR | Status: DC | PRN
  Administered 2022-11-20: 4 mg via INTRAVENOUS

## 2022-11-20 MED ORDER — DEXMEDETOMIDINE HCL IN NACL 80 MCG/20ML IV SOLN
INTRAVENOUS | Status: DC | PRN
  Administered 2022-11-20 (×2): 4 ug via INTRAVENOUS

## 2022-11-20 MED ORDER — INSULIN ASPART 100 UNIT/ML IJ SOLN: 0.0000 [IU] | Freq: Every day | INTRAMUSCULAR | Status: DC

## 2022-11-20 MED ORDER — FLUTICASONE PROPIONATE 50 MCG/ACT NA SUSP: 1.0000 | Freq: Every day | NASAL | Status: DC | PRN

## 2022-11-20 MED ORDER — CITALOPRAM HYDROBROMIDE 10 MG PO TABS: 10.0000 mg | ORAL_TABLET | Freq: Every day | ORAL | Status: DC

## 2022-11-20 MED ORDER — 0.9 % SODIUM CHLORIDE (POUR BTL) OPTIME
TOPICAL | Status: DC | PRN
  Administered 2022-11-20: 500 mL

## 2022-11-20 MED ORDER — PROPOFOL 10 MG/ML IV BOLUS
INTRAVENOUS | Status: AC
  Filled 2022-11-20: qty 20

## 2022-11-20 MED ORDER — SODIUM CHLORIDE 0.9 % IV SOLN
2.0000 g | INTRAVENOUS | Status: DC
  Administered 2022-11-20 – 2022-11-23 (×4): 2 g via INTRAVENOUS
  Filled 2022-11-20 (×6): qty 20

## 2022-11-20 MED ORDER — CHLORHEXIDINE GLUCONATE 0.12 % MT SOLN
15.0000 mL | Freq: Once | OROMUCOSAL | Status: AC
  Administered 2022-11-20: 15 mL via OROMUCOSAL

## 2022-11-20 MED ORDER — MIDAZOLAM HCL 2 MG/2ML IJ SOLN
INTRAMUSCULAR | Status: AC
  Filled 2022-11-20: qty 2

## 2022-11-20 MED ORDER — OXYCODONE HCL 5 MG PO TABS: 5.0000 mg | ORAL_TABLET | Freq: Once | ORAL | Status: DC | PRN

## 2022-11-20 MED ORDER — FENTANYL CITRATE (PF) 100 MCG/2ML IJ SOLN
INTRAMUSCULAR | Status: DC | PRN
  Administered 2022-11-20: 50 ug via INTRAVENOUS

## 2022-11-20 MED ORDER — BUPIVACAINE HCL (PF) 0.5 % IJ SOLN
INTRAMUSCULAR | Status: AC
  Filled 2022-11-20: qty 10

## 2022-11-20 MED ORDER — LISINOPRIL 20 MG PO TABS: 40.0000 mg | ORAL_TABLET | Freq: Every evening | ORAL | Status: DC

## 2022-11-20 MED ORDER — VANCOMYCIN HCL 1500 MG/300ML IV SOLN
1500.0000 mg | Freq: Once | INTRAVENOUS | Status: AC
  Administered 2022-11-20: 1500 mg via INTRAVENOUS
  Filled 2022-11-20: qty 300

## 2022-11-20 MED ORDER — ACETAMINOPHEN 325 MG PO TABS
650.0000 mg | ORAL_TABLET | Freq: Four times a day (QID) | ORAL | Status: DC | PRN
Start: 2022-11-20 — End: 2022-11-23

## 2022-11-20 MED ORDER — SODIUM CHLORIDE 0.9 % IV SOLN: 12.5000 mg | Freq: Three times a day (TID) | INTRAVENOUS | Status: AC | PRN

## 2022-11-20 MED ORDER — LIDOCAINE HCL (PF) 2 % IJ SOLN
INTRAMUSCULAR | Status: AC
  Filled 2022-11-20: qty 5

## 2022-11-20 MED ORDER — EPHEDRINE SULFATE-NACL 50-0.9 MG/10ML-% IV SOSY
PREFILLED_SYRINGE | INTRAVENOUS | Status: DC | PRN
  Administered 2022-11-20 (×2): 5 mg via INTRAVENOUS
  Administered 2022-11-20: 10 mg via INTRAVENOUS

## 2022-11-20 MED ORDER — ROCURONIUM BROMIDE 10 MG/ML (PF) SYRINGE
PREFILLED_SYRINGE | INTRAVENOUS | Status: AC
  Filled 2022-11-20: qty 10

## 2022-11-20 MED ORDER — BUPIVACAINE LIPOSOME 1.3 % IJ SUSP
INTRAMUSCULAR | Status: AC
  Filled 2022-11-20: qty 20

## 2022-11-20 MED ORDER — TORSEMIDE 10 MG PO TABS: 10.0000 mg | ORAL_TABLET | Freq: Every day | ORAL | Status: DC

## 2022-11-20 MED ORDER — DEXAMETHASONE SODIUM PHOSPHATE 10 MG/ML IJ SOLN
INTRAMUSCULAR | Status: DC | PRN
  Administered 2022-11-20: 5 mg via INTRAVENOUS

## 2022-11-20 MED ORDER — BUPROPION HCL ER (XL) 150 MG PO TB24: 150.0000 mg | ORAL_TABLET | Freq: Every day | ORAL | Status: DC

## 2022-11-20 MED ORDER — LEVOTHYROXINE SODIUM 137 MCG PO TABS
137.0000 ug | ORAL_TABLET | Freq: Every day | ORAL | Status: DC
  Administered 2022-11-21 – 2022-11-23 (×3): 137 ug via ORAL
  Filled 2022-11-20 (×4): qty 1

## 2022-11-20 MED ORDER — PROPOFOL 10 MG/ML IV BOLUS
INTRAVENOUS | Status: DC | PRN
  Administered 2022-11-20: 150 mg via INTRAVENOUS

## 2022-11-20 SURGICAL SUPPLY — 74 items
ADAPTER IRRIG TUBE 2 SPIKE SOL (ADAPTER) ×2 IMPLANT
ADH SKN CLS APL DERMABOND .7 (GAUZE/BANDAGES/DRESSINGS)
ADPR TBG 2 SPK PMP STRL ASCP (ADAPTER) ×2
APL PRP STRL LF DISP 70% ISPRP (MISCELLANEOUS) ×1
BLADE OSCILLATING/SAGITTAL (BLADE)
BLADE SHAVER 4.5X7 STR FR (MISCELLANEOUS) ×1 IMPLANT
BLADE SW THK.38XMED LNG THN (BLADE) IMPLANT
BNDG ADH 2 X3.75 FABRIC TAN LF (GAUZE/BANDAGES/DRESSINGS) ×1 IMPLANT
BNDG ADH XL 3.75X2 STRCH LF (GAUZE/BANDAGES/DRESSINGS) ×1
BUR BR 5.5 WIDE MOUTH (BURR) IMPLANT
CANNULA PART THRD DISP 5.75X7 (CANNULA) IMPLANT
CANNULA PARTIAL THREAD 2X7 (CANNULA) ×1 IMPLANT
CANNULA TWIST IN 8.25X9CM (CANNULA) IMPLANT
CHLORAPREP W/TINT 26 (MISCELLANEOUS) ×1 IMPLANT
COOLER POLAR GLACIER W/PUMP (MISCELLANEOUS) ×1 IMPLANT
COVER LIGHT HANDLE STERIS (MISCELLANEOUS) ×1 IMPLANT
DERMABOND ADVANCED .7 DNX12 (GAUZE/BANDAGES/DRESSINGS) IMPLANT
DRAPE INCISE IOBAN 66X45 STRL (DRAPES) ×1 IMPLANT
DRAPE U-SHAPE 47X51 STRL (DRAPES) ×2 IMPLANT
DRSG TELFA 3X4 N-ADH STERILE (GAUZE/BANDAGES/DRESSINGS) IMPLANT
ELECT REM PT RETURN 9FT ADLT (ELECTROSURGICAL) ×1
ELECTRODE REM PT RTRN 9FT ADLT (ELECTROSURGICAL) ×1 IMPLANT
GAUZE SPONGE 4X4 12PLY STRL (GAUZE/BANDAGES/DRESSINGS) ×1 IMPLANT
GAUZE XEROFORM 1X8 LF (GAUZE/BANDAGES/DRESSINGS) ×1 IMPLANT
GLOVE BIO SURGEON STRL SZ7.5 (GLOVE) ×2 IMPLANT
GLOVE BIOGEL PI IND STRL 8 (GLOVE) ×2 IMPLANT
GLOVE SURG SYN 8.0 (GLOVE) ×2 IMPLANT
GLOVE SURG SYN 8.0 PF PI (GLOVE) ×2 IMPLANT
GOWN STRL REUS W/ TWL LRG LVL3 (GOWN DISPOSABLE) ×2 IMPLANT
GOWN STRL REUS W/TWL LRG LVL3 (GOWN DISPOSABLE) ×2
GOWN STRL REUS W/TWL XL LVL4 (GOWN DISPOSABLE) ×1 IMPLANT
IV LR IRRIG 3000ML ARTHROMATIC (IV SOLUTION) ×4 IMPLANT
KIT STABILIZATION SHOULDER (MISCELLANEOUS) ×1 IMPLANT
KIT TURNOVER KIT A (KITS) ×1 IMPLANT
MANIFOLD NEPTUNE II (INSTRUMENTS) ×2 IMPLANT
MASK FACE SPIDER DISP (MASK) ×1 IMPLANT
MAT ABSORB FLUID 56X50 GRAY (MISCELLANEOUS) ×2 IMPLANT
NDL HYPO 22X1.5 SAFETY MO (MISCELLANEOUS) ×1 IMPLANT
NDL MAYO 6 CRC TAPER PT (NEEDLE) IMPLANT
NDL SCORPION MULTI FIRE (NEEDLE) IMPLANT
NEEDLE HYPO 22X1.5 SAFETY MO (MISCELLANEOUS) ×1 IMPLANT
NEEDLE MAYO 6 CRC TAPER PT (NEEDLE) IMPLANT
NEEDLE SCORPION MULTI FIRE (NEEDLE) IMPLANT
PACK ARTHROSCOPY SHOULDER (MISCELLANEOUS) ×1 IMPLANT
PAD ABD DERMACEA PRESS 5X9 (GAUZE/BANDAGES/DRESSINGS) ×1 IMPLANT
PAD ARMBOARD 7.5X6 YLW CONV (MISCELLANEOUS) ×2 IMPLANT
PAD WRAPON POLAR SHDR XLG (MISCELLANEOUS) ×1 IMPLANT
PASSER SUT FIRSTPASS SELF (INSTRUMENTS) ×1 IMPLANT
SHAVER BLADE BONE CUTTER 5.5 (BLADE) IMPLANT
SLEEVE REMOTE CONTROL 5X12 (DRAPES) IMPLANT
SLING ARM LRG DEEP (SOFTGOODS) IMPLANT
SLING ULTRA II M (MISCELLANEOUS) ×1 IMPLANT
SPONGE T-LAP 18X18 ~~LOC~~+RFID (SPONGE) ×1 IMPLANT
STRAP SAFETY 5IN WIDE (MISCELLANEOUS) ×1 IMPLANT
SUT ETHILON 3-0 (SUTURE) ×1 IMPLANT
SUT LASSO 90 DEG SD STR (SUTURE) IMPLANT
SUT MNCRL 4-0 (SUTURE)
SUT MNCRL 4-0 27XMFL (SUTURE)
SUT PROLENE 0 CT 2 (SUTURE) IMPLANT
SUT TICRON 2-0 30IN 311381 (SUTURE) IMPLANT
SUT VIC AB 0 CT1 36 (SUTURE) IMPLANT
SUT VIC AB 2-0 CT2 27 (SUTURE) IMPLANT
SUTURE MNCRL 4-0 27XMF (SUTURE) IMPLANT
SUTURE TAPE 1.3 40 TPR END (SUTURE) IMPLANT
SUTURETAPE 1.3 40 TPR END (SUTURE)
SWAB CULTURE AMIES ANAERIB BLU (MISCELLANEOUS) IMPLANT
SYR 10ML LL (SYRINGE) IMPLANT
TAPE MICROFOAM 4IN (TAPE) ×1 IMPLANT
TRAP FLUID SMOKE EVACUATOR (MISCELLANEOUS) ×1 IMPLANT
TUBE SET DOUBLEFLO INFLOW (TUBING) ×1 IMPLANT
TUBE SET DOUBLEFLO OUTFLOW (TUBING) ×1 IMPLANT
WAND WEREWOLF FLOW 90D (MISCELLANEOUS) ×1 IMPLANT
WATER STERILE IRR 500ML POUR (IV SOLUTION) ×1 IMPLANT
WRAPON POLAR PAD SHDR XLG (MISCELLANEOUS) ×1

## 2022-11-20 NOTE — H&P (Addendum)
History and Physical   Jacob Lawson GNF:[id number] DOB: 1955-06-29 DOA: 11/20/2022  PCP: Mick Sell, MD  Outpatient Specialists: Dr. Signa Kell, orthopedic specialists  I have personally briefly reviewed patient's old medical records in [facility] EMR.  Chief Concern: Left joint infection  HPI: Mr. Jacob Lawson is a 67 year old male with history of paroxysmal atrial fibrillation, hypertension, hyperlipidemia, obesity, non-insulin-dependent diabetes mellitus, CKD 3A, who is a direct admission with orthopedic service for left arthroscopic irrigation debridement of glenohumeral joint.  Hospitalist service was consulted for direct admission concerning for septic joint.  Vitals on day of direct admission revealed temperature of 97.6, respiration rate 64, blood pressure 160/102, SpO2 99% on room air.  Aerobic and anaerobic cultures were obtained intraoperatively and are in process.  Patient was given cefazolin 2 g IV by orthopedic service. --------------------------------------- At bedside, patient was able to tell me his name, age, location, current calendar year.  Patient reports left upper extremity numbness as he had just received a nerve block.  He reports the nerve block usually lasts about 1 day for him.  He denies chest pain, shortness of breath, dysuria, hematuria, diarrhea.  Social history: He lives at home with his wife of 43 years.  They have been together for 50 years.  He denies tobacco and recreational drug use.  He infrequently consumes EtOH.  He is retired.  ROS: Constitutional: no weight change, no fever ENT/Mouth: no sore throat, no rhinorrhea Eyes: no eye pain, no vision changes Cardiovascular: no chest pain, no dyspnea,  no edema, no palpitations Respiratory: no cough, no sputum, no wheezing Gastrointestinal: no nausea, no vomiting, no diarrhea, no constipation Genitourinary: no urinary incontinence, no dysuria, no hematuria Musculoskeletal: +  arthralgias of the left shoulder before surgery, no myalgias Skin: no skin lesions, no pruritus, Neuro: + weakness, no loss of consciousness, no syncope Psych: no anxiety, no depression, + decrease appetite Heme/Lymph: no bruising, no bleeding  Assessment/Plan  Principal Problem:   Septic joint of left shoulder region [facility] Active Problems:   Aortic atherosclerosis (HCC)   Depression   GERD (gastroesophageal reflux disease)   Diabetes mellitus type 2, noninsulin dependent (HCC)   Essential hypertension   Hypothyroidism   Hyperlipidemia   Assessment and Plan:  * Septic joint of left shoulder region (HCC) IntraOp culture have been collected and are in process Infectious disease specialist has been consulted, appreciate further recommendations Status post wound VAC in place Admit to telemetry medical, inpatient  Hyperlipidemia Home rosuvastatin 5 mg nightly resumed  Hypothyroidism Levothyroxine 125 mcg daily before breakfast resumed  Essential hypertension Home amlodipine 5 mg daily, hydrochlorothiazide 25 mg daily, lisinopril 40 mg every evening, metoprolol succinate 50 mg every evening, spironolactone 25 mg daily, torsemide 10 mg daily were resumed on admission  Diabetes mellitus type 2, noninsulin dependent (HCC) Patient last had A1c on 09/04/2022: 6.5, average glucose reading of 140 Recheck A1c in the a.m. Insulin SSI with at bedtime coverage ordered Goal inpatient blood glucose level is 140-180  Note: AM team to complete med reconciliation  Chart reviewed.   DVT prophylaxis: TED hose; per orthopedic surgeon, patient can resume home anticoagulation on 11/21/2022.  AM team to resume as appropriate. Code Status: DNR/DNI, per patient's request at bedside.  He reports that he believes when it is his time it will be his time and he does not want to drive on and become a burden to his family. Diet: Heart healthy/carb modified Family Communication: Updated spouse, Mrs. Jacob Lawson  over  the phone Disposition Plan: Pending clinical course Consults called: Infectious disease specialist Admission status: Telemetry medical, inpatient  Past Medical History:  Diagnosis Date   Acquired hypothyroidism    Allergic rhinitis    Anginal pain (HCC)    Aortic atherosclerosis (HCC)    Carpal tunnel syndrome on both sides    Carpal tunnel syndrome on left    a.) s/p release   Chronic lower back pain    CKD (chronic kidney disease), stage III (HCC)    Colloid LEFT thyroid nodule    Depression    Diastolic dysfunction    a.) TTE 10/19/2020: EF >55%; mild LVH, mile BAE, mild RV enlargement; triv AR/TR/PR, mild MR; G1DD.   Difficult airway for intubation    DOE (dyspnea on exertion)    Generalized anxiety disorder    GERD (gastroesophageal reflux disease)    Hepatic steatosis    Hyperlipidemia    Hypertension    Hypogonadism in male    a.) s/p Testim implant in 2014. b.) uses exogenous TD testosterone   Hypothyroidism    Migraines    Mild mitral regurgitation by prior echocardiogram    Occipital neuralgia    OSA on CPAP    Osteoarthritis    Pulmonary nodules    Renal cyst, left    T2DM (type 2 diabetes mellitus) (HCC)    Vitiligo    Wears hearing aid in both ears    Past Surgical History:  Procedure Laterality Date   APPENDECTOMY     CARPAL TUNNEL RELEASE Right 11/2003   CARPAL TUNNEL RELEASE Left 11/19/2017   Procedure: LEFT OPEN CARPAL TUNNEL RELEASE;  Surgeon: Erin Sons, MD;  Location: [facility];  Service: Orthopedics;  Laterality: Left;  Diabetic - oral meds Sleep Apnea   CHONDROPLASTY Right 03/22/2020   Procedure: CHONDROPLASTY;  Surgeon: Christena Flake, MD;  Location: [facility] ORS;  Service: Orthopedics;  Laterality: Right;  ABRASION CHONDROPLASTY   COLONOSCOPY     2/91, 6/00, 01/04, 7/08, 10/10, 12/13, 02/26/17   COLONOSCOPY WITH PROPOFOL N/A 03/07/2020   Procedure: COLONOSCOPY WITH PROPOFOL;  Surgeon: Regis Bill, MD;  Location: [facility]  ENDOSCOPY;  Service: Endoscopy;  Laterality: N/A;   ESOPHAGOGASTRODUODENOSCOPY     2/91, 1/95, 6/00, 1/04, 7/08, 10/10   ESOPHAGOGASTRODUODENOSCOPY  03/07/2020   Procedure: ESOPHAGOGASTRODUODENOSCOPY (EGD);  Surgeon: Regis Bill, MD;  Location: [facility] ENDOSCOPY;  Service: Endoscopy;;   EXCISION NEUROMA Left 06/2007   foot  had this done twice   FINGER SURGERY Bilateral    for congenital webbing   GANGLION CYST EXCISION Right 09/23/2019   Procedure: REMOVAL GANGLION OF FLEXOR SHEATH RIGHT LONG FINGER;  Surgeon: Christena Flake, MD;  Location: [facility] ORS;  Service: Orthopedics;  Laterality: Right;   IRRIGATION AND DEBRIDEMENT FOOT Left 11/09/2018   Procedure: IRRIGATION AND DEBRIDEMENT FOOT;  Surgeon: Linus Galas, DPM;  Location: [facility] ORS;  Service: Podiatry;  Laterality: Left;   KNEE ARTHROSCOPY Right 03/22/2020   Procedure: RIGHT KNEE ARTHROSCOPY WITH PARTIAL MENISCECTOMY;  Surgeon: Christena Flake, MD;  Location: [facility] ORS;  Service: Orthopedics;  Laterality: Right;   KNEE ARTHROSCOPY Right 05/25/2021   Procedure: RIGHT KNEE ARTHROSCOPY WITH DEBRIDEMENT AND PARTIAL MEDIAL MENISCECTOMY;  Surgeon: Christena Flake, MD;  Location: [facility] ORS;  Service: Orthopedics;  Laterality: Right;   KNEE ARTHROSCOPY WITH MENISCAL REPAIR Right 03/22/2020   Procedure: KNEE ARTHROSCOPY WITH MENISCAL REPAIR;  Surgeon: Christena Flake, MD;  Location: [facility] ORS;  Service: Orthopedics;  Laterality: Right;  LASIK Bilateral    MANDIBLE SURGERY     in late 30s, fo overbite   PROSTATE BIOPSY  12/2015   ROTATOR CUFF REPAIR Left 02/2005   SHOULDER ARTHROSCOPY WITH SUBACROMIAL DECOMPRESSION AND OPEN ROTATOR C Right 07/16/2019   Procedure: Right shoulder arthroscopy with debridement, decompression, possible rotator cuff repair, and probable biceps tenodesis;  Surgeon: Christena Flake, MD;  Location: [facility] ORS;  Service: Orthopedics;  Laterality: Right;   THYROIDECTOMY, PARTIAL     TONSILLECTOMY     and adenoidectomy   Social History:   reports that he has never smoked. He has never used smokeless tobacco. He reports current alcohol use of about 1.0 - 2.0 standard drink of alcohol per week. He reports that he does not use drugs.  Allergies  Allergen Reactions   Lamictal [Lamotrigine] Hives   Pravastatin Other (See Comments)    Muscle joint/pain.   Simvastatin Other (See Comments)    Muscle aches   Statins Other (See Comments)    Muscle/joint aches    Family History  Problem Relation Age of Onset   Dementia Mother    Pancreatic cancer Father    Prostate cancer Brother    Family history: Family history reviewed and not pertinent.  Prior to Admission medications   Medication Sig Start Date End Date Taking? Authorizing Provider  acetaminophen (TYLENOL) 500 MG tablet Take 2 tablets (1,000 mg total) by mouth every 8 (eight) hours. 09/07/22 09/07/23 Yes Signa Kell, MD  amLODipine (NORVASC) 10 MG tablet Take 1 tablet by mouth daily. 01/31/22 01/31/23 Yes [provider]  amLODipine (NORVASC) 5 MG tablet Take 5 mg by mouth daily. 01/26/22  Yes [provider]  buPROPion (WELLBUTRIN XL) 150 MG 24 hr tablet Take 150 mg by mouth daily.   Yes [provider]  busPIRone (BUSPAR) 7.5 MG tablet Take 1 tablet by mouth 2 (two) times daily. 05/31/22  Yes [provider]  Cholecalciferol (VITAMIN D3) 125 MCG (5000 UT) TABS Take 5,000 Units by mouth in the morning.   Yes [provider]  citalopram (CELEXA) 10 MG tablet Take 1 tablet by mouth daily. 06/04/22 06/04/23 Yes [provider]  cloNIDine (CATAPRES) 0.1 MG tablet Take by mouth. 05/15/22 05/15/23 Yes [provider]  Cysteamine Bitartrate (PROCYSBI) 300 MG PACK See admin instructions. 01/31/22 01/31/23 Yes [provider]  empagliflozin (JARDIANCE) 10 MG TABS tablet Take 10 mg by mouth daily.   Yes [provider]  esomeprazole (NEXIUM) 20 MG capsule Take 20 mg by mouth daily as needed (acid reflux/indigestion.).    Yes [provider]  finasteride (PROSCAR) 5 MG tablet TAKE 1 TABLET BY MOUTH EVERY DAY 07/19/22  Yes Vanna Scotland, MD  fluticasone [facility]) 50 MCG/ACT nasal spray Place 1-2 sprays into both nostrils daily as needed for allergies.    Yes [provider]  glucose blood (PRECISION QID TEST) test strip 1 each (1 strip total) once daily 01/31/22 01/31/23 Yes [provider]  hydrochlorothiazide (HYDRODIURIL) 25 MG tablet Take 25 mg by mouth daily. 05/08/22  Yes [provider]  levothyroxine (SYNTHROID, LEVOTHROID) 125 MCG tablet Take 125 mcg by mouth daily before breakfast.   Yes [provider]  liothyronine (CYTOMEL) 50 MCG tablet Take 50 mcg by mouth daily. 01/03/22 01/03/23 Yes [provider]  lisinopril (ZESTRIL) 40 MG tablet Take 40 mg by mouth every evening.   Yes [provider]  loratadine (CLARITIN) 10 MG tablet Take 10 mg by mouth daily as needed  for allergies.   Yes [provider]  metoprolol succinate (TOPROL-XL) 50 MG 24 hr tablet Take 50 mg by mouth in the morning. Take with or immediately following a meal.   Yes [provider]  ondansetron (ZOFRAN-ODT) 4 MG disintegrating tablet Take 1 tablet (4 mg total) by mouth every 8 (eight) hours as needed for nausea or vomiting. 09/07/22  Yes Signa Kell, MD  oxyCODONE (ROXICODONE) 5 MG immediate release tablet Take 1-2 tablets (5-10 mg total) by mouth every 4 (four) hours as needed (pain). 09/07/22 09/07/23 Yes Signa Kell, MD  rosuvastatin (CRESTOR) 5 MG tablet Take 5 mg by mouth daily.   Yes [provider]  sildenafil (REVATIO) 20 MG tablet Take 2-5 tablets by mouth prior to intercourse 02/09/22  Yes Vanna Scotland, MD  spironolactone (ALDACTONE) 25 MG tablet Take by mouth. 06/20/22 06/20/23 Yes [provider]  Testosterone Enanthate (XYOSTED) 100 MG/0.5ML SOAJ Inject 0.5 mLs (100 mg total) into the skin once a week. 09/28/22  Yes Stoioff, Verna Czech, MD  torsemide (DEMADEX) 10 MG tablet Take 1 tablet by mouth daily. 06/20/22 06/20/23 Yes [provider]  traMADol Janean Sark) 50 MG tablet Take by mouth. 06/14/22  Yes [provider]  vitamin B-12 (CYANOCOBALAMIN) 1000 MCG tablet Take 1,000 mcg by mouth in the morning.   Yes [provider]  XYOSTED 100 MG/0.5ML SOAJ Inject 100 mg into the skin once a week.   Yes [provider]  zinc sulfate 220 (50 Zn) MG capsule Take 220 mg by mouth in the morning.   Yes [provider]   Physical Exam: Vitals:   11/20/22 1545 11/20/22 1600 11/20/22 1615 11/20/22 1630  BP: 123/87 (!) 133/90 (!) 145/93 (!) 142/93  Pulse: 70 67 66 60  Resp: (!) 23 17 18  (!) 22  Temp:      TempSrc:      SpO2: 96% 95% 97% 96%  Weight:      Height:       Constitutional: appears age appropriate, NAD, calm Eyes: PERRL, lids and conjunctivae normal ENMT: Mucous membranes are moist. Posterior pharynx clear of any exudate or lesions. Age-appropriate dentition. Hearing appropriate Neck: normal, supple, no masses, no thyromegaly Respiratory: clear to auscultation bilaterally, no wheezing, no crackles. Normal respiratory effort. No accessory muscle use.  Cardiovascular: Regular rate and rhythm, no murmurs / rubs / gallops. No extremity edema. 2+ pedal pulses. No carotid bruits.  Abdomen: no tenderness, no masses palpated, no hepatosplenomegaly. Bowel sounds positive.  Musculoskeletal: no clubbing / cyanosis. No joint deformity upper and lower extremities. Good ROM, no contractures, no atrophy. Normal muscle tone.  No sensation in the left extremity and it is in a sling with wound VAC in place. Skin: no rashes, lesions, ulcers. No induration Neurologic: Sensation intact. Strength 5/5 in all 4.  Psychiatric: Normal judgment and insight. Alert and oriented x 3. Normal mood.   EKG: Not indicated at this time  Chest x-ray on Admission: Not indicated at this time  [place] OR NERVE BLOCK-IMAGE  ONLY [facility])  Result Date: 11/20/2022 There is no interpretation for this exam.  This order is for images obtained during a surgical procedure.  Please See "Surgeries" Tab for more information regarding the procedure.    Labs on Admission: I have personally reviewed following labs  CBC: Recent Labs  Lab 11/20/22 1457  WBC 7.5  NEUTROABS 6.2  HGB 16.2  HCT 50.9  MCV 84.0  PLT 160   Basic Metabolic Panel: Recent  Labs  Lab 11/20/22 1457  NA 136  K 3.7  CL 102  CO2 25  GLUCOSE 126*  BUN 19  CREATININE 1.21  CALCIUM 8.4*   GFR: Estimated Creatinine Clearance: 75.8 mL/min (by C-G formula based on SCr of 1.21 mg/dL).  Liver Function Tests: Recent Labs  Lab 11/20/22 1457  AST 23  ALT 22  ALKPHOS 59  BILITOT 0.7  PROT 7.3  ALBUMIN 4.0   CBG: Recent Labs  Lab 11/20/22 0951 11/20/22 1636  GLUCAP 101* 165*   Urine analysis:    Component Value Date/Time   COLORURINE YELLOW (A) 11/07/2018 0959   APPEARANCEUR CLOUDY (A) 11/07/2018 0959   LABSPEC 1.015 11/07/2018 0959   PHURINE 5.0 11/07/2018 0959   GLUCOSEU >=500 (A) 11/07/2018 0959   HGBUR MODERATE (A) 11/07/2018 0959   BILIRUBINUR NEGATIVE 11/07/2018 0959   KETONESUR NEGATIVE 11/07/2018 0959   PROTEINUR 100 (A) 11/07/2018 0959   NITRITE NEGATIVE 11/07/2018 0959   LEUKOCYTESUR NEGATIVE 11/07/2018 0959   This document was prepared using Dragon Voice Recognition software and may include unintentional dictation errors.  Dr. Sedalia Muta [facility]  If 7PM-7AM, please contact overnight-coverage provider If 7AM-7PM, please contact day attending provider [email or web address]  11/20/2022, 5:15 PM

## 2022-11-20 NOTE — Assessment & Plan Note (Signed)
Home finasteride 5 mg daily resumed

## 2022-11-20 NOTE — Anesthesia Preprocedure Evaluation (Signed)
Anesthesia Evaluation  Patient identified by MRN, date of birth, ID band Patient awake  General Assessment Comment:  Prior Grade 3 view with mcgrath.  Reviewed: Allergy & Precautions, H&P , NPO status , Patient's Chart, lab work & pertinent test results  History of Anesthesia Complications Negative for: history of anesthetic complications  Airway Mallampati: IV  TM Distance: >3 FB Neck ROM: Full  Mouth opening: Limited Mouth Opening Comment: Thick neck, limited oral opening, would expect likely difficult airway if intubation needed, would consider videolaryngoscope if intubation needed. Dental  (+) Caps   Pulmonary asthma , sleep apnea and Continuous Positive Airway Pressure Ventilation , neg COPD   Pulmonary exam normal breath sounds clear to auscultation       Cardiovascular hypertension, + angina  + DOE  (-) Past MI and (-) Cardiac Stents Normal cardiovascular exam Rhythm:Regular Rate:Normal  Cardiac MRI: (06/13/2022) IMPRESSION:  1. Normal LV size and function. LVEF 56%.   2. No LV LGE or scar.   3. No evidence for infiltrative disease.   4. Normal RV size and function.   5. No significant valvular abnormalities.   6. No evidence for amyloidosis.   Echocardiogram 2D complete: (05/29/2022) INTERPRETATION NORMAL LEFT VENTRICULAR SYSTOLIC FUNCTION WITH AN ESTIMATED EF = >55 % NORMAL RIGHT VENTRICULAR SYSTOLIC FUNCTION MILD TRICUSPID, MITRAL AND AORTIC VALVE INSUFFICIENCY NO VALVULAR STENOSIS MILD RV ENLARGEMENT MILD BIATRIAL ENLARGEMENT MILD LVH MILDLY DILATED AORTIC ROOT MEASURING 3.9 CM GLS: -17.2 %  NM Myocardial Perfusion SPECT multiple (stress and rest): (10/19/2020) IMPRESSION: Normal myocardial fusion scan no evidence of stress-induced myocardial ischemia ejection fraction 50% conclusion negative scan   IMPRESSION: 1. Coronary calcium score of 422. This was 78th percentile for age and sex matched control.    2. Normal coronary origin with right dominance.   3. Mild proximal RCA and D1 stenosis (25-49%).   4. Minimal LCX and proximal LAD stenosis (<25%).   5. CAD-RADS 2. Mild non-obstructive CAD (25-49%). Consider non-atherosclerotic causes of chest pain. Consider preventive therapy and risk factor modification.   Electronically Signed: By: Debbe Odea M.D. On: 11/09/2021 16:03     Neuro/Psych  Headaches PSYCHIATRIC DISORDERS Anxiety Depression     Neuromuscular disease    GI/Hepatic Neg liver ROS,GERD  ,,  Endo/Other  diabetesHypothyroidism    Renal/GU Renal disease  negative genitourinary   Musculoskeletal  (+) Arthritis ,    Abdominal   Peds negative pediatric ROS (+)  Hematology negative hematology ROS (+)   Anesthesia Other Findings Hypothyroidism  Hyperlipidemia Hypertension  OSA on CPAP Wears hearing aid in both ears Depression Carpal tunnel syndrome on both sides GERD (gastroesophageal reflux disease) T2DM (type 2 diabetes mellitus)  Migraines Allergic rhinitis Aortic atherosclerosis  Anginal pain (HCC) DOE (dyspnea on exertion) CKD (chronic kidney disease), stage III Chronic lower back pain Colloid LEFT thyroid nodule  Hepatic steatosis Hypogonadism in male  Carpal tunnel syndrome on left Occipital neuralgia  Osteoarthritis Pulmonary nodules  Renal cyst, left Vitiligo  Diastolic dysfunction    Reproductive/Obstetrics negative OB ROS                             Anesthesia Physical Anesthesia Plan  ASA: 3  Anesthesia Plan: General   Post-op Pain Management: Ofirmev IV (intra-op)* and Regional block*   Induction: Intravenous  PONV Risk Score and Plan: 2 and Ondansetron, Dexamethasone, Midazolam and Treatment may vary due to age or medical condition  Airway Management Planned:  Oral ETT and Video Laryngoscope Planned  Additional Equipment: None  Intra-op Plan:   Post-operative Plan: Extubation in  OR  Informed Consent: I have reviewed the patients History and Physical, chart, labs and discussed the procedure including the risks, benefits and alternatives for the proposed anesthesia with the patient or authorized representative who has indicated his/her understanding and acceptance.     Dental Advisory Given  Plan Discussed with: Anesthesiologist, CRNA and Surgeon  Anesthesia Plan Comments: (Discussed risks of anesthesia with patient, including PONV, sore throat, lip/dental/eye damage. Rare risks discussed as well, such as cardiorespiratory and neurological sequelae, and allergic reactions. Discussed the role of CRNA in patient's perioperative care. Patient understands.  Patient has potential infection collection at his prior surgical site; MRI reads it as located at incision site and extending into the [facility] [place] joint space. On exam, this area has a small amount of drainage and is red. It is sufficiently far from the site of a proposed interscalene nerve block injection to be reasonably safe, in my opinion.  Discussed r/b/a of interscalene block, including elective nature. Risks discussed: - Rare: bleeding, infection, nerve damage - shortness of breath from hemidiaphragmatic paralysis - unilateral horner's syndrome - poor/non-working blocks - reactions and toxicity to local anesthetic Patient understands and agrees. )        Anesthesia Quick Evaluation

## 2022-11-20 NOTE — Consult Note (Signed)
Pharmacy Antibiotic Note  Jacob Lawson is a 67 y.o. male admitted on 11/20/2022 withprosthetic joint infection.  Pharmacy has been consulted for vancomycin dosing.  Plan: Give vancomycin 2500 mg IV x 1, then start vancomycin 1000 mg IV every 12 hours Estimated AUC 433, Cmin 13.2 IBW, Scr 1.21, Vd 0.72 Vancomycin levels at steady state or as clinically indicated Ceftriaxone 2g IV every 24 hours per provider Follow renal function and cultures for adjustments  Height: 6' (182.9 cm) Weight: 109.8 kg (242 lb) IBW/kg (Calculated) : 77.6  Temp (24hrs), Avg:98 F (36.7 C), Min:97.5 F (36.4 C), Max:98.9 F (37.2 C)  Recent Labs  Lab 11/20/22 1457  WBC 7.5  CREATININE 1.21    Estimated Creatinine Clearance: 75.8 mL/min (by C-G formula based on SCr of 1.21 mg/dL).    Allergies  Allergen Reactions   Lamictal [Lamotrigine] Hives   Pravastatin Other (See Comments)    Muscle joint/pain.   Simvastatin Other (See Comments)    Muscle aches   Statins Other (See Comments)    Muscle/joint aches     Antimicrobials this admission: vancomycin 10/29 >>  Ceftriaxone 10/29 >>   Dose adjustments this admission: N/A  Microbiology results: 10/29 Surgical cultures: pending  Thank you for allowing pharmacy to be a part of this patient's care.  Barrie Folk, PharmD 11/20/2022 11:04 PM

## 2022-11-20 NOTE — Op Note (Signed)
Operative Note    SURGERY DATE: 11/20/2022   PRE-OP DIAGNOSIS:  1. Superficial and deep infection of L shoulder consistent with septic arthritis   POST-OP DIAGNOSIS:  1. Superficial and deep infection of L shoulder consistent with septic arthritis   PROCEDURES:  1.  Left open irrigation and debridement of anterior portal with arthrotomy  2.  Left arthroscopic Irrigation and debridement 2.  Left extensive glenohumeral debridement  3.  Left arthroscopic complete synovectomy of shoulder   SURGEON: Rosealee Albee, MD  ASSISTANT: Ahmed Prima, PA-S    ANESTHESIA: Gen    ESTIMATED BLOOD LOSS: 10cc   TOTAL IV FLUIDS: per anesthesia  DRAIN: Hemovac x 1   INDICATION(S):  TOMASI GARICA is a 67 y.o. male S/p left mini open rotator cuff reconstruction with allograft interposition, biceps tenodesis, subacromial decompression, and distal clavicle excision on 09/07/2022.he developed drainage from his anterior portal incision.  Aspirate results were negative, but subsequent wound culture in the office showed C.acnes.  Initial plan was to perform surgery approximately 2 weeks ago, but the patient developed atrial fibrillation with RVR in the perioperative holding area so surgery was canceled.  He was evaluated by cardiology and deemed medically optimized to proceed.  After discussion of risks, benefits, and alternatives to surgery, the patient elected to proceed.    OPERATIVE FINDINGS: significant purulent material from anterior portal; on probing this tract, it reached deep to the glenohumeral joint.  Rotator cuff repair appeared intact initially, but upon removal of sutures, graft was torn  Intra-operative findings: A thorough arthroscopic examination of the shoulder was performed.  The findings are: 1. Biceps tendon: Not visualized intra-articularly 2. Superior labrum: Significant erythema 3. Posterior labrum and capsule: Severe synovitis of capsule 4. Inferior capsule and inferior recess:  Severe synovitis 5. Glenoid cartilage surface: Normal 6. Supraspinatus attachment: Normal 7. Posterior rotator cuff attachment: normal 8. Humeral head articular cartilage: Diffuse grade 1 degenerative changes 9. Rotator interval: Severe synovitis 10: Subscapularis tendon: attachment intact 11. Anterior labrum: Erythematous 12. IGHL: Synovitic    OPERATIVE REPORT:   I identified EGON DIVELBISS in the pre-operative holding area. Informed consent was obtained and the surgical site was marked. I reviewed the risks and benefits of the proposed surgical intervention and the patient (and/or patient's guardian) wished to proceed. The patient was transferred to the operative suite and general anesthesia was administered. The patient was placed in the beach chair position with the head of the bed elevated approximately 90 degrees. All down side pressure points were appropriately padded. The extremity was then prepped and draped in standard fashion. A time out was performed confirming the correct extremity, correct patient, and correct procedure.   Open irrigation and debridement was performed first.  The anterior portal was reopened using an 11 blade.  There was immediate return of purulent material.  This appeared to be thick, cottage cheese like material.  This was completely expressed from the wound.  2 culture samples were sent.  Utilizing a hemostat and curette, the entire wound tract was debrided.  Using a switching stick, the wound tract was probed.  With gentle probing, I was able to perform an arthrotomy, suggesting that the infection tracked deep into the glenohumeral joint.  The anterior portal tract was thoroughly irrigated.   Next, I then created a standard posterior portal with an 11 blade. The glenohumeral joint was easily entered with a blunt trocar.  Culture sample was obtained of the joint fluid that returned out of the  trocar.  While visualizing into the joint, a tissue sample of the rotator  interval tissue was taken and sent for culture specimen as well.  Irrigation fluid was turned on and IV antibiotics were started. The findings of diagnostic arthroscopy are described above. I debrided degenerative tissue including the superior and anterior labrum, the rotator interval, and cartilage of the humeral head were necessary. I debrided and excised and then also coagulated the inflamed synovium using an Arthrocare radiofrequency device. Essentially, a complete synovectomy was performed to ensure removal of all infectious material.  Sutures from the anterior anchor were removed.  Camera switched to the anterior portal, and utilizing the posterior portal, the posterior capsule was debrided such that there was no longer synovitic change.  The middle and posterior anchor sutures were also removed.  Upon removal, it was noted that the graft was not intact.  Copious fluid was run through the joint.   The arthroscope was then placed in the subacromial space. An accessory lateral portal was established. A complete subacromial bursectomy and debridement of the gutters was carried out with a shaver.  ArthroCare was used to control bleeding.  Sutures from the bursal side of the rotator cuff/allograft were also removed.  Next, the allograft in its entirety was removed as well.  The edges of the rotator cuff were debrided with an oscillating shaver, and it was retracted to the level of the glenoid.  Next, I reentered the glenohumeral joint and a drain was placed through the anterior portal and connected to a Hemovac.  We closed the portals with 3-0 Nylon sutures. Wounds were covered with xeroform and a sterile dressing. PolarCare and sling were applied. The patient was awakened from anesthesia without complication and taken to the PACU for further recovery.  POSTOPERATIVE PLAN: The patient will be admitted to the hospitalist team.  Infectious disease team was also consulted given infection tracking deep to the  shoulder, consistent with septic arthritis. F/U OR Culture results. Can resume anticoagulation on POD#1. Maintain drain for at least 2 days.

## 2022-11-20 NOTE — Assessment & Plan Note (Signed)
IntraOp culture have been collected and are in process Infectious disease specialist has been consulted, appreciate further recommendations Status post wound VAC in place Admit to telemetry medical, inpatient

## 2022-11-20 NOTE — Assessment & Plan Note (Signed)
Patient last had A1c on 09/04/2022: 6.5, average glucose reading of 140 Recheck A1c in the a.m. Insulin SSI with at bedtime coverage ordered Goal inpatient blood glucose level is 140-180

## 2022-11-20 NOTE — Progress Notes (Signed)
CROSS COVER NOTE  NAME: Jacob Lawson MRN: [id number] DOB : November 05, 1955    Concern as stated by nurse / staff   Requests for discontinuation of telemetry inpatient admitted a few hours prior     Pertinent findings on chart review: H&P reviewed, vitals reviewed  Assessment and  Interventions   Assessment:  Postop septic arthritis with history of intubation  Plan: Currently stable, however discussed with admitting provider who recommends continuation of telemetry.  Telemetry order to continue X X

## 2022-11-20 NOTE — Assessment & Plan Note (Addendum)
-   Levothyroxine 137 mcg daily resumed 

## 2022-11-20 NOTE — Assessment & Plan Note (Signed)
Home rosuvastatin 5 mg nightly resumed

## 2022-11-20 NOTE — Assessment & Plan Note (Addendum)
Home valsartan 80 mg (irbesartan 75 mg daily) equivalent daily and carvedilol 12.5 mg p.o. twice daily with meals for resumed for 10/30 Hydralazine 5 mg IV every 6 hours as needed for SBP greater 165, 4 days ordered

## 2022-11-20 NOTE — Transfer of Care (Signed)
Immediate Anesthesia Transfer of Care Note  Patient: Jacob Lawson  Procedure(s) Performed: Left shoulder open irrigation and debridement, arthroscopic irrigation and debridement and extensive debridment (Left: Shoulder) Left shoulder open irrigation and debridement, arthroscopic irrigation and debridement and extensive debridment (Left: Shoulder)  Patient Location: PACU  Anesthesia Type:General  Level of Consciousness: awake, alert , and oriented  Airway & Oxygen Therapy: Patient Spontanous Breathing and Patient connected to face mask oxygen  Post-op Assessment: Report given to RN and Post -op Vital signs reviewed and stable  Post vital signs: Reviewed and stable  Last Vitals: Nl temp   See PACU flow sheet.  Pt denies pain Vitals Value Taken Time  BP 129/85 11/20/22 1345  Temp    Pulse 68 11/20/22 1346  Resp 19 11/20/22 1346  SpO2 100 % 11/20/22 1346  Vitals shown include unfiled device data.  Last Pain:  Vitals:   11/20/22 0950  TempSrc: Temporal  PainSc: 3       Patients Stated Pain Goal: 0 (11/20/22 0950)  Complications:  Encounter Notable Events  Notable Event Outcome Phase Comment  Difficult to intubate - expected  Intraprocedure Filed from anesthesia note documentation.

## 2022-11-20 NOTE — Anesthesia Postprocedure Evaluation (Signed)
Anesthesia Post Note  Patient: Jacob Lawson  Procedure(s) Performed: Left shoulder open irrigation and debridement, arthroscopic irrigation and debridement and extensive debridment (Left: Shoulder) Left shoulder open irrigation and debridement, arthroscopic irrigation and debridement and extensive debridment (Left: Shoulder)  Patient location during evaluation: PACU Anesthesia Type: General Level of consciousness: awake and alert Pain management: pain level controlled Vital Signs Assessment: post-procedure vital signs reviewed and stable Respiratory status: spontaneous breathing, nonlabored ventilation, respiratory function stable and patient connected to nasal cannula oxygen Cardiovascular status: blood pressure returned to baseline and stable Postop Assessment: no apparent nausea or vomiting Anesthetic complications: yes   Encounter Notable Events  Notable Event Outcome Phase Comment  Difficult to intubate - expected  Intraprocedure Filed from anesthesia note documentation.     Last Vitals:  Vitals:   11/20/22 1400 11/20/22 1415  BP: (!) 144/87 (!) 128/93  Pulse: 73 67  Resp: 15 16  Temp:    SpO2: 95% 95%    Last Pain:  Vitals:   11/20/22 1415  TempSrc:   PainSc: 0-No pain                 Corinda Gubler

## 2022-11-20 NOTE — Hospital Course (Signed)
Mr. Jacob Lawson is a 67 year old male with history of paroxysmal atrial fibrillation, hypertension, hyperlipidemia, obesity, non-insulin-dependent diabetes mellitus, CKD 3A, who is a direct admission with orthopedic service for left arthroscopic irrigation debridement of glenohumeral joint.  Hospitalist service was consulted for direct admission concerning for septic joint.  Vitals on day of direct admission revealed temperature of 97.6, respiration rate 64, blood pressure 160/102, SpO2 99% on room air.  Aerobic and anaerobic cultures were obtained intraoperatively and are in process.  Patient was given cefazolin 2 g IV by orthopedic service.

## 2022-11-20 NOTE — Anesthesia Procedure Notes (Addendum)
Procedure Name: Intubation Date/Time: 11/20/2022 11:10 AM  Performed by: Corinda Gubler, MDPre-anesthesia Checklist: Patient identified, Patient being monitored, Timeout performed, Emergency Drugs available and Suction available Patient Re-evaluated:Patient Re-evaluated prior to induction Oxygen Delivery Method: Circle system utilized Preoxygenation: Pre-oxygenation with 100% oxygen Induction Type: IV induction Ventilation: Mask ventilation with difficulty, Two handed mask ventilation required and Oral airway inserted - appropriate to patient size Laryngoscope Size: 3 and McGraph (mcgrath 3 X blade utilized) Grade View: Grade I Tube type: Oral Tube size: 7.0 mm Number of attempts: 2 Airway Equipment and Method: Stylet Placement Confirmation: ETT inserted through vocal cords under direct vision, positive ETCO2 and breath sounds checked- equal and bilateral Secured at: 23 cm Tube secured with: Tape Dental Injury: Teeth and Oropharynx as per pre-operative assessment  Difficulty Due To: Difficulty was anticipated and Difficult Airway- due to large tongue Comments: One attempt by CRNA , followed by attempt by MD with success. Grade I view, however excess soft tissue in mouth, small mouth opening.  CRNA able to have grade 1 view with McGrath X3 blade use.  Small mouth opening, large tongue, crowded redundant tissue.  No desaturation with good preoxygenation.  Pt placed on 10 L 02 via FM on arrival to OR s/p  PNB with sedation and hx of OSA then good preoxygenation prior to induction.    Eyes were taped prior to DL.  ETT secured x 3 with X and additional tape.    Atraumatic.

## 2022-11-20 NOTE — Anesthesia Procedure Notes (Signed)
Anesthesia Regional Block: Interscalene brachial plexus block   Pre-Anesthetic Checklist: , timeout performed,  Correct Patient, Correct Site, Correct Laterality,  Correct Procedure, Correct Position, site marked,  Risks and benefits discussed,  Surgical consent,  Pre-op evaluation,  At surgeon's request and post-op pain management  Laterality: Left  Prep: chloraprep       Needles:  Injection technique: Single-shot  Needle Type: Echogenic Needle     Needle Length: 4cm  Needle Gauge: 25     Additional Needles:   Procedures:,,,, ultrasound used (permanent image in chart),,    Narrative:  Injection made incrementally with aspirations every 5 mL.  Performed by: Personally  Anesthesiologist: Corinda Gubler, MD  Additional Notes: Patient's chart reviewed and they were deemed appropriate candidate for procedure, at surgeon's request. Patient educated about risks, benefits, and alternatives of the block including but not limited to: temporary or permanent nerve damage, bleeding, infection, damage to surround tissues, pneumothorax, hemidiaphragmatic paralysis, unilateral Horner's syndrome, block failure, local anesthetic toxicity. Patient expressed understanding. A formal time-out was conducted consistent with institution rules.  Monitors were applied, and minimal sedation used (see nursing record). The site was prepped with skin prep and allowed to dry, and sterile gloves were used. A high frequency linear ultrasound probe with probe cover was utilized throughout. C5-7 nerve roots located and appeared anatomically normal, though the image was challenging due to recent post-surgical changes. Local anesthetic injected around the roots, and echogenic block needle trajectory was monitored throughout. Aspiration performed every 5ml. Lung and blood vessels were avoided. Area of redness and drainage in distal shoulder / AC joint avoided. All injections were performed without resistance and free of  blood and paresthesias. The patient tolerated the procedure well.  Injectate: 20ml exparel + 10ml 0.5% bupivacaine

## 2022-11-20 NOTE — Assessment & Plan Note (Signed)
Home sertraline 100 mg daily resumed

## 2022-11-20 NOTE — H&P (Signed)
See outpatient note from 10/18/22 for previous history.  In brief, patient is s/p Left arthroscopic subscapularis repair with mini open supraspinatus repair using allograft bridging.  Around the 5-week postoperative mark, he noticed some swelling about the anterior portal incision.  This continued to increase despite p.o. antibiotics.  He underwent aspiration of the fluid material under ultrasound guidance.  Cultures from this aspirate were negative and cell count was approximately 11,000.  He obtained an MRI of the shoulder which showed an extensive fluid collection about the anterior portal with potential tracking into the glenohumeral joint.  Rotator cuff integrity was difficult to evaluate on the MRI, but there appeared to be at least a small region of rotator cuff re- tear.  Patient had spontaneous drainage of this region and wound cultures showed C.acnes.  Patient had been scheduled to undergo irrigation and debridement of the shoulder on 11/09/2022, but developed new onset atrial fibrillation with RVR in the preoperative holding area.  After discussion with the anesthesia team, recommendation was for formal cardiology evaluation.  This was performed last week and the patient was started on anticoagulation and deemed medically optimized to proceed with this surgery.  Plan is for open irrigation and debridement of the anterior portal incision followed by arthroscopic irrigation debridement of the remainder of the glenohumeral joint.  Further decision on removing allograft patch and/or sutures and anchors will be based on intraoperative findings.

## 2022-11-20 NOTE — Consult Note (Signed)
NAME: Jacob Lawson  DOB: 09/29/1955  MRN: [id number]  Date/Time: 11/21/22   REQUESTING PROVIDER: Dr.Patel Subjective:  REASON FOR CONSULT: left shoulder joint infection ? Jacob Lawson is a 67 y.o. male with a history of hypertension, hypothyroidism, sleep apnea, hyperlipidemia , h/o left foot abscess in 2020, DJD, rt knee medial menisceal tear repair, underwent extensive left rotator cuff repair and reconstruction on 09/07/22 which included biceps tenodesis,subacromial decompression.xtensive debridement of shoulder (glenohumeral and subacromial spaces) and distal clavicle excision He developed a small area of erythematous swelling  at the surgical site and was given bactrim on 10/23/22    and had aspiration of the left shoulder abscess on 10/29/22 and cell count was [id number] with no crystals. Culture neg He then had cheesy material expressed    He received another course of bactrim on 10/11 Another culture of the wound sent  on 10/14 was cutibacterium acnes He was taken to OR ton 11/20/22 and hardware removed I am seeing him for management of the infection   Past Medical History:  Diagnosis Date   Acquired hypothyroidism    Allergic rhinitis    Anginal pain (HCC)    Aortic atherosclerosis (HCC)    Carpal tunnel syndrome on both sides    Carpal tunnel syndrome on left    a.) s/p release   Chronic lower back pain    CKD (chronic kidney disease), stage III (HCC)    Colloid LEFT thyroid nodule    Depression    Diastolic dysfunction    a.) TTE 10/19/2020: EF >55%; mild LVH, mile BAE, mild RV enlargement; triv AR/TR/PR, mild MR; G1DD.   Difficult airway for intubation    DOE (dyspnea on exertion)    Generalized anxiety disorder    GERD (gastroesophageal reflux disease)    Hepatic steatosis    Hyperlipidemia    Hypertension    Hypogonadism in male    a.) s/p Testim implant in 2014. b.) uses exogenous TD testosterone   Hypothyroidism    Migraines    Mild mitral regurgitation by  prior echocardiogram    Occipital neuralgia    OSA on CPAP    OSA on CPAP    Osteoarthritis    Pulmonary nodules    Renal cyst, left    T2DM (type 2 diabetes mellitus) (HCC)    Vitiligo    Wears hearing aid in both ears     Past Surgical History:  Procedure Laterality Date   APPENDECTOMY     CARPAL TUNNEL RELEASE Right 11/2003   CARPAL TUNNEL RELEASE Left 11/19/2017   Procedure: LEFT OPEN CARPAL TUNNEL RELEASE;  Surgeon: Erin Sons, MD;  Location: [facility];  Service: Orthopedics;  Laterality: Left;  Diabetic - oral meds Sleep Apnea   CHONDROPLASTY Right 03/22/2020   Procedure: CHONDROPLASTY;  Surgeon: Christena Flake, MD;  Location: [facility];  Service: Orthopedics;  Laterality: Right;  ABRASION CHONDROPLASTY   COLONOSCOPY     2/91, 6/00, 01/04, 7/08, 10/10, 12/13, 02/26/17   COLONOSCOPY WITH PROPOFOL N/A 03/07/2020   Procedure: COLONOSCOPY WITH PROPOFOL;  Surgeon: Regis Bill, MD;  Location: [facility];  Service: Endoscopy;  Laterality: N/A;   ESOPHAGOGASTRODUODENOSCOPY     2/91, 1/95, 6/00, 1/04, 7/08, 10/10   ESOPHAGOGASTRODUODENOSCOPY  03/07/2020   Procedure: ESOPHAGOGASTRODUODENOSCOPY (EGD);  Surgeon: Regis Bill, MD;  Location: [facility];  Service: Endoscopy;;   EXCISION NEUROMA Left 06/2007   foot  had this done twice   FINGER SURGERY Bilateral    for  congenital webbing   GANGLION CYST EXCISION Right 09/23/2019   Procedure: REMOVAL GANGLION OF FLEXOR SHEATH RIGHT LONG FINGER;  Surgeon: Christena Flake, MD;  Location: [facility];  Service: Orthopedics;  Laterality: Right;   IRRIGATION AND DEBRIDEMENT FOOT Left 11/09/2018   Procedure: IRRIGATION AND DEBRIDEMENT FOOT;  Surgeon: Linus Galas, DPM;  Location: [facility];  Service: Podiatry;  Laterality: Left;   KNEE ARTHROSCOPY Right 03/22/2020   Procedure: RIGHT KNEE ARTHROSCOPY WITH PARTIAL MENISCECTOMY;  Surgeon: Christena Flake, MD;  Location: [facility];  Service: Orthopedics;  Laterality: Right;   KNEE  ARTHROSCOPY Right 05/25/2021   Procedure: RIGHT KNEE ARTHROSCOPY WITH DEBRIDEMENT AND PARTIAL MEDIAL MENISCECTOMY;  Surgeon: Christena Flake, MD;  Location: [facility];  Service: Orthopedics;  Laterality: Right;   KNEE ARTHROSCOPY WITH MENISCAL REPAIR Right 03/22/2020   Procedure: KNEE ARTHROSCOPY WITH MENISCAL REPAIR;  Surgeon: Christena Flake, MD;  Location: [facility];  Service: Orthopedics;  Laterality: Right;   LASIK Bilateral    MANDIBLE SURGERY     in late 30s, fo overbite   PROSTATE BIOPSY  12/2015   ROTATOR CUFF REPAIR Left 02/2005   SHOULDER ARTHROSCOPY WITH SUBACROMIAL DECOMPRESSION AND OPEN ROTATOR C Right 07/16/2019   Procedure: Right shoulder arthroscopy with debridement, decompression, possible rotator cuff repair, and probable biceps tenodesis;  Surgeon: Christena Flake, MD;  Location: [facility];  Service: Orthopedics;  Laterality: Right;   THYROIDECTOMY, PARTIAL     TONSILLECTOMY     and adenoidectomy    Social History   Socioeconomic History   Marital status: Married    Spouse name: Not on file   Number of children: Not on file   Years of education: Not on file   Highest education level: Not on file  Occupational History   Not on file  Tobacco Use   Smoking status: Never   Smokeless tobacco: Never  Vaping Use   Vaping status: Never Used  Substance and Sexual Activity   Alcohol use: Yes    Alcohol/week: 1.0 - 2.0 standard drink of alcohol    Types: 1 - 2 Cans of beer per week    Comment: occasional beer   Drug use: No   Sexual activity: Yes    Birth control/protection: None  Other Topics Concern   Not on file  Social History Narrative   Not on file   Social Determinants of Health   Financial Resource Strain: Low Risk  (08/30/2022)   Received from [facility]   Overall Financial Resource Strain (CARDIA)    Difficulty of Paying Living Expenses: Not hard at all  Food Insecurity: No Food Insecurity (08/30/2022)   Received from [facility]    Hunger Vital Sign    Worried About Running Out of Food in the Last Year: Never true    Ran Out of Food in the Last Year: Never true  Transportation Needs: No Transportation Needs (08/30/2022)   Received from [facility] - Transportation    In the past 12 months, has lack of transportation kept you from medical appointments or from getting medications?: No    Lack of Transportation (Non-Medical): No  Physical Activity: Not on file  Stress: Not on file  Social Connections: Not on file  Intimate Partner Violence: Not on file    History reviewed. No pertinent family history. Allergies  Allergen Reactions   Lamictal [Lamotrigine] Hives   Pravastatin Other (See Comments)    Muscle joint/pain.  Simvastatin Other (See Comments)    Muscle aches   Statins Other (See Comments)    Muscle/joint aches    I? Current Facility-Administered Medications  Medication Dose Route Frequency Provider Last Rate Last Admin   0.9 %  sodium chloride infusion   Intravenous Continuous Cox, Amy N, DO   New Bag at 11/20/22 1052   acetaminophen (OFIRMEV) IV 1,000 mg  1,000 mg Intravenous Once PRN Corinda Gubler, MD       acetaminophen (TYLENOL) tablet 650 mg  650 mg Oral Q6H PRN Cox, Amy N, DO       Or   acetaminophen (TYLENOL) suppository 650 mg  650 mg Rectal Q6H PRN Cox, Amy N, DO       ceFAZolin (ANCEF) IVPB 2g/100 mL premix  2 g Intravenous Q8H Signa Kell, MD       fentaNYL (SUBLIMAZE) injection 25-50 mcg  25-50 mcg Intravenous Q5 min PRN Corinda Gubler, MD       ondansetron [facility]) injection 4 mg  4 mg Intravenous Q6H PRN Cox, Amy N, DO       oxyCODONE (Oxy IR/ROXICODONE) immediate release tablet 5 mg  5 mg Oral Once PRN Corinda Gubler, MD       Or   oxyCODONE (ROXICODONE) 5 MG/5ML solution 5 mg  5 mg Oral Once PRN Corinda Gubler, MD       promethazine (PHENERGAN) 12.5 mg in sodium chloride 0.9 % 50 mL IVPB  12.5 mg Intravenous Q8H PRN Cox, Amy N, DO       senna-docusate (Senokot-S)  tablet 1 tablet  1 tablet Oral QHS PRN Cox, Amy N, DO         Abtx:  Anti-infectives (From admission, onward)    Start     Dose/Rate Route Frequency Ordered Stop   11/20/22 1900  ceFAZolin (ANCEF) IVPB 2g/100 mL premix        2 g 200 mL/hr over 30 Minutes Intravenous Every 8 hours 11/20/22 1451 11/21/22 0559   11/20/22 0930  ceFAZolin (ANCEF) IVPB 2g/100 mL premix        2 g 200 mL/hr over 30 Minutes Intravenous On call to O.R. 11/20/22 0927 11/20/22 1159       REVIEW OF SYSTEMS:  Const: negative fever, negative chills, negative weight loss Eyes: negative diplopia or visual changes, negative eye pain ENT: negative coryza, negative sore throat Resp: negative cough, hemoptysis, dyspnea Cards: negative for chest pain, palpitations, lower extremity edema GU: negative for frequency, dysuria and hematuria GI: Negative for abdominal pain, diarrhea, bleeding, constipation Skin: negative for rash and pruritus Heme: negative for easy bruising and gum/nose bleeding MS: left shoulder pain  Neurolo:negative for headaches, dizziness, vertigo, memory problems  Psych: negative for feelings of anxiety, depression  Endocrine: negative for thyroid, diabetes Allergy/Immunology- statin Objective:  VITALS:  BP (!) 128/93 (BP Location: Right Arm)   Pulse 67   Temp (!) 97.5 F (36.4 C)   Resp 16   Ht 6' (1.829 m)   Wt 109.8 kg   SpO2 95%   BMI 32.82 kg/m   PHYSICAL EXAM:  General: Alert, cooperative, no distress, appears stated age.  Head: Normocephalic, without obvious abnormality, atraumatic. Eyes: Conjunctivae clear, anicteric sclerae. Pupils are equal ENT Nares normal. No drainage or sinus tenderness. Lips, mucosa, and tongue normal. No Thrush Neck: Supple, symmetrical, no adenopathy, thyroid: non tender no carotid bruit and no JVD. Back: No CVA tenderness. Lungs: Clear to auscultation bilaterally. No Wheezing or Rhonchi. No rales. Heart: Regular  rate and rhythm, no murmur, rub or  gallop. Abdomen: Soft, non-tender,not distended. Bowel sounds normal. No masses Extremities: Left shoulder in a sling Skin: No rashes or lesions. Or bruising Lymph: Cervical, supraclavicular normal. Neurologic: Grossly non-focal Pertinent Labs Lab Results CBC    Component Value Date/Time   WBC 7.7 11/09/2022 1328   RBC 6.70 (H) 11/09/2022 1328   HGB 17.9 (H) 11/09/2022 1328   HGB 16.8 09/28/2022 1000   HCT 55.4 (H) 11/09/2022 1328   HCT 53.0 (H) 09/28/2022 1000   PLT 201 11/09/2022 1328   PLT 185 02/24/2014 1742   MCV 82.7 11/09/2022 1328   MCV 83 02/24/2014 1742   MCH 26.7 11/09/2022 1328   MCHC 32.3 11/09/2022 1328   RDW 15.9 (H) 11/09/2022 1328   RDW 14.5 02/24/2014 1742   LYMPHSABS 1.7 11/10/2018 1320   MONOABS 1.3 (H) 11/10/2018 1320   EOSABS 0.2 11/10/2018 1320   BASOSABS 0.1 11/10/2018 1320       Latest Ref Rng & Units 11/09/2022    1:28 PM 11/09/2021   10:44 AM 03/18/2020    9:42 AM  CMP  Glucose 70 - 99 mg/dL 98   528   BUN 8 - 23 mg/dL 33   22   Creatinine 4.13 - 1.24 mg/dL 2.44  0.10  2.72   Sodium 135 - 145 mmol/L 134   137   Potassium 3.5 - 5.1 mmol/L 3.7   3.8   Chloride 98 - 111 mmol/L 98   101   CO2 22 - 32 mmol/L 24   28   Calcium 8.9 - 10.3 mg/dL 9.4   9.5       Microbiology: Microbiology Cultures have been sent from surgery Previous culture from 11/05/2022 was Propionibacterium acnes  IMAGING RESULTS: MRI done on 11/05/2022 Of the left shoulder shows complete tear of the supraspinatus tendon 2 cm from the peripheral insertion within 2 point percent of the retraction.  Moderate tendinosis of the subscapularis tendon.  Severe arthropathy of the acro clavicular joint.  2.4 into 4.3- 11.7 cm fluid collection in the subcutaneous fat just beneath the incision site I have personally reviewed the films ? Impression/Recommendation Left shoulder joint infection secondary to Propionibacterium acnes following on rotator cuff repair Patient is  currently on vancomycin and ceftriaxone Surgical cultures have been sent from yesterday when he underwent debridement and removal of all the hardware. Once that culture finalizes will be able to de-escalate.  Patient is going to need IV antibiotics Antibiotic of choice for Propionibacterium is either IV penicillin or first generation cephalosporin or ceftriaxone.  For ease of administration once a day regimen like ceftriaxone may be very helpful.  The duration could be from 4 to 6 weeks.  We can do initial.  Of 2 to 4 weeks of IV followed by p.o. Explained to the patient in great detail    ? Discussed with patient, requesting provider Note:  This document was prepared using Dragon voice recognition software and may include unintentional dictation errors.

## 2022-11-21 ENCOUNTER — Encounter: Payer: Self-pay | Admitting: Orthopedic Surgery

## 2022-11-21 DIAGNOSIS — M00812 Arthritis due to other bacteria, left shoulder: Secondary | ICD-10-CM

## 2022-11-21 DIAGNOSIS — M009 Pyogenic arthritis, unspecified: Secondary | ICD-10-CM | POA: Diagnosis not present

## 2022-11-21 DIAGNOSIS — B9689 Other specified bacterial agents as the cause of diseases classified elsewhere: Secondary | ICD-10-CM | POA: Diagnosis not present

## 2022-11-21 LAB — GLUCOSE, CAPILLARY
Glucose-Capillary: 103 mg/dL — ABNORMAL HIGH (ref 70–99)
Glucose-Capillary: 105 mg/dL — ABNORMAL HIGH (ref 70–99)
Glucose-Capillary: 117 mg/dL — ABNORMAL HIGH (ref 70–99)
Glucose-Capillary: 108 mg/dL — ABNORMAL HIGH (ref 70–99)

## 2022-11-21 LAB — CBC
HCT: 48.8 % (ref 39.0–52.0)
Hemoglobin: 15.7 g/dL (ref 13.0–17.0)
MCH: 27.1 pg (ref 26.0–34.0)
MCHC: 32.2 g/dL (ref 30.0–36.0)
MCV: 84.3 fL (ref 80.0–100.0)
Platelets: 173 10*3/uL (ref 150–400)
RBC: 5.79 MIL/uL (ref 4.22–5.81)
RDW: 15.4 % (ref 11.5–15.5)
WBC: 11.1 10*3/uL — ABNORMAL HIGH (ref 4.0–10.5)
nRBC: 0 % (ref 0.0–0.2)

## 2022-11-21 LAB — BASIC METABOLIC PANEL
Anion gap: 10 (ref 5–15)
BUN: 17 mg/dL (ref 8–23)
CO2: 22 mmol/L (ref 22–32)
Calcium: 8.5 mg/dL — ABNORMAL LOW (ref 8.9–10.3)
Chloride: 103 mmol/L (ref 98–111)
Creatinine, Ser: 1.19 mg/dL (ref 0.61–1.24)
GFR, Estimated: >60 mL/min (ref >60)
Glucose, Bld: 114 mg/dL — ABNORMAL HIGH (ref 70–99)
Potassium: 3.6 mmol/L (ref 3.5–5.1)
Sodium: 135 mmol/L (ref 135–145)

## 2022-11-21 LAB — HEMOGLOBIN A1C
Hgb A1c MFr Bld: 6.1 % — ABNORMAL HIGH (ref 4.8–5.6)
Mean Plasma Glucose: 128.37 mg/dL

## 2022-11-21 MED ORDER — OXYCODONE HCL 5 MG PO TABS: 5.0000 mg | ORAL_TABLET | Freq: Four times a day (QID) | ORAL | 0 refills | Status: DC | PRN

## 2022-11-21 MED ORDER — SERTRALINE HCL 50 MG PO TABS
ORAL_TABLET | ORAL | Status: AC
  Filled 2022-11-21: qty 2

## 2022-11-21 MED ORDER — VANCOMYCIN HCL IN DEXTROSE 1-5 GM/200ML-% IV SOLN
INTRAVENOUS | Status: AC
  Filled 2022-11-21: qty 200

## 2022-11-21 MED ORDER — OXYCODONE HCL 5 MG PO TABS: 5.0000 mg | ORAL_TABLET | ORAL | Status: DC | PRN

## 2022-11-21 MED ORDER — PANTOPRAZOLE SODIUM 40 MG PO TBEC
40.0000 mg | DELAYED_RELEASE_TABLET | Freq: Every day | ORAL | Status: DC
  Administered 2022-11-21 – 2022-11-23 (×3): 40 mg via ORAL
  Filled 2022-11-21 (×2): qty 1

## 2022-11-21 MED ORDER — TRAZODONE HCL 100 MG PO TABS: 100.0000 mg | ORAL_TABLET | Freq: Every evening | ORAL | Status: DC | PRN

## 2022-11-21 MED ORDER — MORPHINE SULFATE (PF) 4 MG/ML IV SOLN: 2.0000 mg | INTRAVENOUS | Status: DC | PRN

## 2022-11-21 MED ORDER — CARVEDILOL 12.5 MG PO TABS
ORAL_TABLET | ORAL | Status: AC
  Filled 2022-11-21: qty 1

## 2022-11-21 MED ORDER — PANTOPRAZOLE SODIUM 40 MG PO TBEC
DELAYED_RELEASE_TABLET | ORAL | Status: AC
  Filled 2022-11-21: qty 1

## 2022-11-21 NOTE — Progress Notes (Signed)
Patient awake/alert x4.  Transferred to room 134.  Wife at bedside.  Right PIV redressed due to device not "tight"  Right shoulder polar care changed out due to not cooling.  Report given to charge RN

## 2022-11-21 NOTE — Progress Notes (Signed)
Pharmacy Antibiotic Note  Jacob Lawson is a 67 y.o. male admitted on 11/20/2022 with  Septic Joint/Wound infection .  Pharmacy has been consulted for vancomycin dosing.  Today, 11/21/2022 Day 1 of antibiotics Scr 1.19 (baseline) WBC elevated 11.1 8/16 Left shoulder arthroscopy 10/14 Culture positive for cutibacterium acnes  10/29 Shoulder I&D s/p rotator cuff repair (8/29, 9/26) Hardware removed 10/29  Plan: Continue vancomycin 1000 mg IV Q12H Check Scr daily Also on ceftriaxone 2g Q24H Continue to monitor renal function and follow cultures  Height: 6' (182.9 cm) Weight: 109.8 kg (242 lb) IBW/kg (Calculated) : 77.6  Temp (24hrs), Avg:98 F (36.7 C), Min:97.5 F (36.4 C), Max:98.9 F (37.2 C)  Recent Labs  Lab 11/20/22 1457 11/21/22 0622  WBC 7.5 11.1*  CREATININE 1.21 1.19    Estimated Creatinine Clearance: 77.1 mL/min (by C-G formula based on SCr of 1.19 mg/dL).    Allergies  Allergen Reactions   Lamictal [Lamotrigine] Hives   Pravastatin Other (See Comments)    Muscle joint/pain.   Simvastatin Other (See Comments)    Muscle aches   Statins Other (See Comments)    Muscle/joint aches     Antimicrobials this admission: 10/29 Ceftriaxone 2g >>  10/30 vancomycin 100 mg Q12H >>   Dose adjustments this admission: NA  Microbiology results: 10/29 Wcx (tissue) : NG 10/29 Wcx (fluid #1): GPC cocci in pairs 10/29 Wcx: NG 10/29 Wcx: NG  Thank you for allowing pharmacy to be a part of this patient's care.  Effie Shy, PharmD Pharmacy Resident  11/21/2022 11:35 AM

## 2022-11-21 NOTE — Progress Notes (Signed)
PROGRESS NOTE    Jacob Lawson  XLK:[id number] DOB: Sep 03, 1955 DOA: 11/20/2022 PCP: Mick Sell, MD    Brief Narrative:   67 year old male with history of paroxysmal atrial fibrillation, hypertension, hyperlipidemia, obesity, non-insulin-dependent diabetes mellitus, CKD 3A, who is a direct admission with orthopedic service for left arthroscopic irrigation debridement of glenohumeral joint.   Hospitalist service was consulted for direct admission concerning for septic joint. Status post OR with orthopedics on 10/29.  Status post left open irrigation and debridement.  Tolerated procedure well.     Assessment & Plan:   Principal Problem:   Septic joint of left shoulder region [facility]) Active Problems:   Aortic atherosclerosis (HCC)   Depression   GERD (gastroesophageal reflux disease)   Diabetes mellitus type 2, noninsulin dependent (HCC)   Essential hypertension   Hypothyroidism   Hyperlipidemia   BPH (benign prostatic hyperplasia)  * Septic joint of left shoulder region (HCC) IntraOp culture have been collected and are in process Infectious disease specialist has been consulted, appreciate further recommendations Status post wound VAC in place Plan: Broad-spectrum IV antibiotics Monitor cultures Await further infectious disease recommendations   Hyperlipidemia PTA statin   Hypothyroidism PTA send   Essential hypertension Amlodipine 5 mg daily Hydrochlorothiazide 25 mg daily Lisinopril 40 mg daily Metoprolol succinate 50 mg daily Aldactone 25 mg daily Torsemide 10 mg daily   Diabetes mellitus type 2, noninsulin dependent (HCC) Patient last had A1c on 09/04/2022: 6.5, average glucose reading of 140 Heart diet Insulin SSI with at bedtime coverage ordered Goal inpatient blood glucose level is 140-180   DVT prophylaxis: TED hose.  Restart Eliquis 10/31 Code Status: Full Family Communication: Spouse at bedside 10/30 Disposition Plan: Status is:  Inpatient Remains inpatient appropriate because: Septic joint on IV antibiotics   Level of care: Med-Surg  Consultants:  Orthopedic Infectious disease Procedures:  Left shoulder operative debridement  Antimicrobials: Vancomycin Cefepime Flagyl   Subjective: Seen and examined.  Sitting up in chair.  No visible distress.  Pain mild  Objective: Vitals:   11/21/22 0506 11/21/22 0817 11/21/22 1213 11/21/22 1226  BP: 131/83 (!) 141/96 (!) 117/92 118/78  Pulse: 67 69 73 67  Resp: 16 18 18 16   Temp: 98.1 F (36.7 C) 97.8 F (36.6 C)  98.2 F (36.8 C)  TempSrc: Temporal Temporal    SpO2: 94% 96% 95% 97%  Weight:      Height:        Intake/Output Summary (Last 24 hours) at 11/21/2022 1316 Last data filed at 11/21/2022 0433 Gross per 24 hour  Intake 1270 ml  Output 56 ml  Net 1214 ml   Filed Weights   11/20/22 0950  Weight: 109.8 kg    Examination:  General exam: Appears calm and comfortable  Respiratory system: Clear to auscultation. Respiratory effort normal. Cardiovascular system: S1-S2, RRR, no murmurs, no pedal edema Gastrointestinal system: Soft, NT/ND, normal bowel sounds Central nervous system: Alert and oriented. No focal neurological deficits. Extremities: Left shoulder in sling.  Wound VAC in place Skin: No rashes, lesions or ulcers Psychiatry: Judgement and insight appear normal. Mood & affect appropriate.     Data Reviewed: I have personally reviewed following labs and imaging studies  CBC: Recent Labs  Lab 11/20/22 1457 11/21/22 0622  WBC 7.5 11.1*  NEUTROABS 6.2  --   HGB 16.2 15.7  HCT 50.9 48.8  MCV 84.0 84.3  PLT 160 173   Basic Metabolic Panel: Recent Labs  Lab 11/20/22 1457 11/21/22 0622  NA 136 135  K 3.7 3.6  CL 102 103  CO2 25 22  GLUCOSE 126* 114*  BUN 19 17  CREATININE 1.21 1.19  CALCIUM 8.4* 8.5*   GFR: Estimated Creatinine Clearance: 77.1 mL/min (by C-G formula based on SCr of 1.19 mg/dL). Liver Function  Tests: Recent Labs  Lab 11/20/22 1457  AST 23  ALT 22  ALKPHOS 59  BILITOT 0.7  PROT 7.3  ALBUMIN 4.0   No results for input(s): "LIPASE", "AMYLASE" in the last 168 hours. No results for input(s): "AMMONIA" in the last 168 hours. Coagulation Profile: No results for input(s): "INR", "PROTIME" in the last 168 hours. Cardiac Enzymes: No results for input(s): "CKTOTAL", "CKMB", "CKMBINDEX", "TROPONINI" in the last 168 hours. BNP (last 3 results) No results for input(s): "PROBNP" in the last 8760 hours. HbA1C: Recent Labs    11/21/22 0622  HGBA1C 6.1*   CBG: Recent Labs  Lab 11/20/22 0951 11/20/22 1636 11/20/22 2121 11/21/22 0755 11/21/22 1132  GLUCAP 101* 165* 167* 103* 105*   Lipid Profile: No results for input(s): "CHOL", "HDL", "LDLCALC", "TRIG", "CHOLHDL", "LDLDIRECT" in the last 72 hours. Thyroid Function Tests: No results for input(s): "TSH", "T4TOTAL", "FREET4", "T3FREE", "THYROIDAB" in the last 72 hours. Anemia Panel: No results for input(s): "VITAMINB12", "FOLATE", "FERRITIN", "TIBC", "IRON", "RETICCTPCT" in the last 72 hours. Sepsis Labs: No results for input(s): "PROCALCITON", "LATICACIDVEN" in the last 168 hours.  Recent Results (from the past 240 hour(s))  Aerobic/Anaerobic Culture w Gram Stain (surgical/deep wound)     Status: None (Preliminary result)   Collection Time: 11/20/22 11:35 AM   Specimen: Path fluid; Body Fluid  Result Value Ref Range Status   Specimen Description   Final    FLUID Performed at [facility], [address] [place], [place] [address]    Special Requests LEFT SHOULDER ANTERIOR WOUND 1  Final   Gram Stain   Final    RARE WBC PRESENT,BOTH PMN AND MONONUCLEAR NO ORGANISMS SEEN    Culture   Final    NO GROWTH < 12 HOURS Performed at [facility], [address], [place], [place] [address]    Report Status PENDING  Incomplete  Aerobic/Anaerobic Culture w Gram Stain (surgical/deep wound)     Status: None  (Preliminary result)   Collection Time: 11/20/22 11:43 AM   Specimen: Path fluid; Body Fluid  Result Value Ref Range Status   Specimen Description   Final    FLUID Performed at [facility], [address] [place], [place] [address]    Special Requests LEFT SHOULDER ANTERIO WOUND 2  Final   Gram Stain   Final    RARE WBC PRESENT,BOTH PMN AND MONONUCLEAR NO ORGANISMS SEEN    Culture   Final    NO GROWTH < 12 HOURS Performed at [facility], [address], [place], [place] [address]    Report Status PENDING  Incomplete  Aerobic/Anaerobic Culture w Gram Stain (surgical/deep wound)     Status: None (Preliminary result)   Collection Time: 11/20/22 11:47 AM   Specimen: Path fluid; Body Fluid  Result Value Ref Range Status   Specimen Description   Final    FLUID Performed at [facility], [address] [place], [place] [address]    Special Requests LEFT SHOULDER GLENOHUMERAL JOINT  Final   Gram Stain   Final    FEW WBC PRESENT,BOTH PMN AND MONONUCLEAR RARE GRAM POSITIVE COCCI IN PAIRS    Culture  Final    NO GROWTH < 12 HOURS Performed at [facility], [address], [place], [place] [address]    Report Status PENDING  Incomplete  Aerobic/Anaerobic Culture w Gram Stain (surgical/deep wound)     Status: None (Preliminary result)   Collection Time: 11/20/22 11:48 AM   Specimen: Path Tissue  Result Value Ref Range Status   Specimen Description   Final    TISSUE Performed at [facility], [address], [place], [place] [address]    Special Requests LEFT SHOULDER GLENOHUMERAL JOINT.  Final   Gram Stain NO WBC SEEN NO ORGANISMS SEEN   Final   Culture   Final    NO GROWTH < 12 HOURS Performed at [facility], [address] [place], [place] [address]    Report Status PENDING  Incomplete         Radiology Studies: [place] OR NERVE BLOCK-IMAGE ONLY [facility])  Result Date: 11/20/2022 There is no interpretation for this  exam.  This order is for images obtained during a surgical procedure.  Please See "Surgeries" Tab for more information regarding the procedure.        Scheduled Meds:  carvedilol  12.5 mg Oral BID WC   cholecalciferol  5,000 Units Oral Daily   cyanocobalamin  1,000 mcg Oral Daily   finasteride  5 mg Oral Daily   insulin aspart  0-15 Units Subcutaneous TID WC   insulin aspart  0-5 Units Subcutaneous QHS   irbesartan  75 mg Oral Daily   levothyroxine  137 mcg Oral Q0600   pantoprazole  40 mg Oral Daily   rosuvastatin  5 mg Oral Q M,W,F   sertraline  100 mg Oral Daily   zinc sulfate  220 mg Oral Daily   Continuous Infusions:  cefTRIAXone (ROCEPHIN)  IV Stopped (11/20/22 2348)   promethazine (PHENERGAN) injection (IM or IVPB)     vancomycin 1,000 mg (11/21/22 1152)     LOS: 1 day     Tresa Moore, MD [facility]   If 7PM-7AM, please contact night-coverage  11/21/2022, 1:16 PM

## 2022-11-21 NOTE — Progress Notes (Signed)
Subjective: 1 Day Post-Op Procedure(s) (LRB): Left shoulder open irrigation and debridement, arthroscopic irrigation and debridement and extensive debridment (Left) Left shoulder open irrigation and debridement, arthroscopic irrigation and debridement and extensive debridment (Left) Patient reports pain as mild.  His nerve block is still working. Patient is well, and has had no acute complaints or problems Plan is to go Home after hospital stay. Negative for chest pain and shortness of breath Fever: no Gastrointestinal: Negative for nausea and vomiting  Objective: Vital signs in last 24 hours: Temp:  [97.5 F (36.4 C)-98.9 F (37.2 C)] 98.1 F (36.7 C) (10/30 0506) Pulse Rate:  [54-79] 67 (10/30 0506) Resp:  [14-25] 16 (10/30 0506) BP: (123-168)/(80-102) 131/83 (10/30 0506) SpO2:  [94 %-100 %] 94 % (10/30 0506) Weight:  [109.8 kg] 109.8 kg (10/29 0950)  Intake/Output from previous day:  Intake/Output Summary (Last 24 hours) at 11/21/2022 0738 Last data filed at 11/21/2022 0433 Gross per 24 hour  Intake 1970 ml  Output 56 ml  Net 1914 ml    Intake/Output this shift: No intake/output data recorded.  Labs: Recent Labs    11/20/22 1457 11/21/22 0622  HGB 16.2 15.7   Recent Labs    11/20/22 1457 11/21/22 0622  WBC 7.5 11.1*  RBC 6.06* 5.79  HCT 50.9 48.8  PLT 160 173   Recent Labs    11/20/22 1457 11/21/22 0622  NA 136 135  K 3.7 3.6  CL 102 103  CO2 25 22  BUN 19 17  CREATININE 1.21 1.19  GLUCOSE 126* 114*  CALCIUM 8.4* 8.5*   No results for input(s): "LABPT", "INR" in the last 72 hours.   EXAM General - Patient is Alert and Oriented Extremity - Sensation intact distally Dorsiflexion/Plantar flexion intact Dressing/Incision - clean, dry, with the Hemovac still intact. Motor Function - intact, moving fingers and wrist well on exam.   Past Medical History:  Diagnosis Date   Acquired hypothyroidism    Allergic rhinitis    Anginal pain (HCC)     Aortic atherosclerosis (HCC)    Carpal tunnel syndrome on both sides    Carpal tunnel syndrome on left    a.) s/p release   Chronic lower back pain    CKD (chronic kidney disease), stage III (HCC)    Colloid LEFT thyroid nodule    Depression    Diastolic dysfunction    a.) TTE 10/19/2020: EF >55%; mild LVH, mile BAE, mild RV enlargement; triv AR/TR/PR, mild MR; G1DD.   Difficult airway for intubation    DOE (dyspnea on exertion)    Generalized anxiety disorder    GERD (gastroesophageal reflux disease)    Hepatic steatosis    Hyperlipidemia    Hypertension    Hypogonadism in male    a.) s/p Testim implant in 2014. b.) uses exogenous TD testosterone   Hypothyroidism    Migraines    Mild mitral regurgitation by prior echocardiogram    Occipital neuralgia    OSA on CPAP    Osteoarthritis    Pulmonary nodules    Renal cyst, left    T2DM (type 2 diabetes mellitus) (HCC)    Vitiligo    Wears hearing aid in both ears     Assessment/Plan: 1 Day Post-Op Procedure(s) (LRB): Left shoulder open irrigation and debridement, arthroscopic irrigation and debridement and extensive debridment (Left) Left shoulder open irrigation and debridement, arthroscopic irrigation and debridement and extensive debridment (Left) Principal Problem:   Septic joint of left shoulder region [facility]) Active Problems:  Aortic atherosclerosis (HCC)   Depression   GERD (gastroesophageal reflux disease)   Diabetes mellitus type 2, noninsulin dependent (HCC)   Essential hypertension   Hypothyroidism   Hyperlipidemia   BPH (benign prostatic hyperplasia)  Estimated body mass index is 32.82 kg/m as calculated from the following:   Height as of this encounter: 6' (1.829 m).   Weight as of this encounter: 109.8 kg. Advance diet Up with therapy  Continue IV antibiotic therapy.  Hospitalist management.  Infectious disease consulted.  Discharge planning: Plan to follow-up at [facility] clinic orthopedics in 2 weeks  for wound care.  Await probable PICC line placement for IV antibiotic therapy at home.  DVT Prophylaxis - TED hose Shoulder immobilizer to left arm at all times.  Plan to remove the Hemovac drain tomorrow.  Awaiting infectious disease consultation.  Dedra Skeens, PA-C Orthopaedic Surgery 11/21/2022, 7:38 AM

## 2022-11-21 NOTE — Discharge Instructions (Signed)
Post-Op Instructions -shoulder scope with incision and drainage with washout  1. Bracing: You will wear a shoulder immobilizer or sling for 2 weeks.   2. Driving: No driving for at least 2 weeks post-op.   3. Activity: Progress to motion as tolerated, moving from passive to active-assisted to active motion. For the first 4 weeks, forward flexion is limited to 100. External rotation with the arm by the side is allowed, but abduction-external rotation is not allowed for the first 6 weeks. After 6 weeks, no restrictions on motion or arm use. Return to normal activities normally takes 4-6 months on average. If rehab goes very well, may be able to do most activities at 4 months, except overhead or contact sports.  4. Physical Therapy: Begins 3-4 days after surgery  5. Medications:  -   IV antibiotics will be started by internal medicine and the infectious disease team. You will be provided a prescription for narcotic pain medicine. After surgery, take 1-2 narcotic tablets every 4 hours if needed for severe pain.  - A prescription for anti-nausea medication will be provided in case the narcotic medicine causes nausea - take 1 tablet every 6 hours only if nauseated.   - Take tylenol 1000 mg (2 Extra Strength tablets or 3 regular strength) every 8 hours for pain.  May decrease or stop tylenol 5 days after surgery if you are having minimal pain. - Take ASA 325mg /day x 2 weeks to help prevent DVTs/PEs (blood clots).  - DO NOT take ANY nonsteroidal anti-inflammatory pain medications (Advil, Motrin, Ibuprofen, Aleve, Naproxen, or Naprosyn). These medicines can inhibit healing of your shoulder repair.    If you are taking prescription medication for anxiety, depression, insomnia, muscle spasm, chronic pain, or for attention deficit disorder, you are advised that you are at a higher risk of adverse effects with use of narcotics post-op, including narcotic addiction/dependence, depressed breathing, death. If  you use non-prescribed substances: alcohol, marijuana, cocaine, heroin, methamphetamines, etc., you are at a higher risk of adverse effects with use of narcotics post-op, including narcotic addiction/dependence, depressed breathing, death. You are advised that taking > 50 morphine milligram equivalents (MME) of narcotic pain medication per day results in twice the risk of overdose or death. For your prescription provided: oxycodone 5 mg - taking more than 6 tablets per day would result in > 50 morphine milligram equivalents (MME) of narcotic pain medication. Be advised that we will prescribe narcotics short-term, for acute post-operative pain only - 3 weeks for major operations such as shoulder repair/reconstruction surgeries.     6. Post-Op Appointment:  Your first post-op appointment will be 10-14 days post-op.  7. Work or School: For most, but not all procedures, we advise staying out of work or school for at least 1 to 2 weeks in order to recover from the stress of surgery and to allow time for healing.   If you need a work or school note this can be provided.   8. Smoking: If you are a smoker, you need to refrain from smoking in the postoperative period. The nicotine in cigarettes will inhibit healing of your shoulder repair and decrease the chance of successful repair. Similarly, nicotine containing products (gum, patches) should be avoided.   Post-operative Brace: Apply and remove the brace you received as you were instructed to at the time of fitting and as described in detail as the brace's instructions for use indicate.  Wear the brace for the period of time prescribed by your physician.  The brace can be cleaned with soap and water and allowed to air dry only.  Should the brace result in increased pain, decreased feeling (numbness/tingling), increased swelling or an overall worsening of your medical condition, please contact your doctor immediately.  If an emergency situation occurs as a  result of wearing the brace after normal business hours, please dial 911 and seek immediate medical attention.  Let your doctor know if you have any further questions about the brace issued to you. Refer to the shoulder sling instructions for use if you have any questions regarding the correct fit of your shoulder sling.  [facility] Customer Care for Troubleshooting: [phone]  Video that illustrates how to properly use a shoulder sling: "Instructions for Proper Use of an Orthopaedic Sling" http://bass.com/

## 2022-11-21 NOTE — Progress Notes (Signed)
Patient ordered to be on telemetry, MD ok with patient being on portable cardiac monitor in room. Patient is a fib currently.

## 2022-11-21 NOTE — Plan of Care (Signed)
  Problem: Activity: Goal: Risk for activity intolerance will decrease Outcome: Progressing   Problem: Pain Management: Goal: General experience of comfort will improve Outcome: Progressing

## 2022-11-22 ENCOUNTER — Inpatient Hospital Stay: Payer: Medicare Other

## 2022-11-22 ENCOUNTER — Other Ambulatory Visit: Payer: Self-pay

## 2022-11-22 DIAGNOSIS — M00812 Arthritis due to other bacteria, left shoulder: Secondary | ICD-10-CM | POA: Diagnosis not present

## 2022-11-22 LAB — GLUCOSE, CAPILLARY
Glucose-Capillary: 105 mg/dL — ABNORMAL HIGH (ref 70–99)
Glucose-Capillary: 87 mg/dL (ref 70–99)
Glucose-Capillary: 183 mg/dL — ABNORMAL HIGH (ref 70–99)
Glucose-Capillary: 160 mg/dL — ABNORMAL HIGH (ref 70–99)

## 2022-11-22 LAB — CREATININE, SERUM
Creatinine, Ser: 1.32 mg/dL — ABNORMAL HIGH (ref 0.61–1.24)
GFR, Estimated: 59 mL/min — ABNORMAL LOW (ref >60)

## 2022-11-22 MED ORDER — CHLORHEXIDINE GLUCONATE CLOTH 2 % EX PADS
6.0000 | MEDICATED_PAD | Freq: Every day | CUTANEOUS | Status: DC
  Administered 2022-11-23: 6 via TOPICAL

## 2022-11-22 MED ORDER — SODIUM CHLORIDE 0.9% FLUSH: 10.0000 mL | INTRAVENOUS | Status: DC | PRN

## 2022-11-22 MED ORDER — SODIUM CHLORIDE 0.9% FLUSH
10.0000 mL | Freq: Two times a day (BID) | INTRAVENOUS | Status: DC
  Administered 2022-11-23 (×2): 10 mL

## 2022-11-22 MED ORDER — APIXABAN 5 MG PO TABS
5.0000 mg | ORAL_TABLET | Freq: Two times a day (BID) | ORAL | Status: DC
  Administered 2022-11-22 – 2022-11-23 (×3): 5 mg via ORAL
  Filled 2022-11-22 (×3): qty 1

## 2022-11-22 MED ORDER — DAPTOMYCIN-SODIUM CHLORIDE 700-0.9 MG/100ML-% IV SOLN
700.0000 mg | Freq: Every day | INTRAVENOUS | Status: DC
  Filled 2022-11-22: qty 100

## 2022-11-22 NOTE — Progress Notes (Signed)
Okay to place PICC per Dr. Rivka Safer with infectious disease.

## 2022-11-22 NOTE — Plan of Care (Signed)

## 2022-11-22 NOTE — Treatment Plan (Addendum)
Diagnosis: Left shoulder septic arthritis  due to propionibacterium and Staphylococcus epidermidis Baseline Creatinine <1    Allergies  Allergen Reactions   Lamictal [Lamotrigine] Hives   Pravastatin Other (See Comments)    Muscle joint/pain.   Simvastatin Other (See Comments)    Muscle aches   Statins Other (See Comments)    Muscle/joint aches     OPAT Orders Discharge antibiotics: Daptomycin 700 mg iV every 24 hours - Duration will be decided after final culture result- please give 7 day supply  ( may change antibiotic depending on final culture result)  And ceftriaxone 2 grams IV every 24 hours X 30 days   Duration: 30 days  End Date:12/22/22  HOLD crestor while on Daptomycin   PIC Care Per Protocol:  Labs weekly while on IV antibiotics: _X_ CBC with differential _X_ CK _X_ CMP _X_ CRP _X_ ESR   _X_ Please pull PIC at completion of IV antibiotics   Fax weekly lab results  promptly to [phone]  Clinic Follow Up Appt: with Dr.Burdette Forehand  on 12/25/22 at 9.15 am    Call [phone] twith any critical value or questions

## 2022-11-22 NOTE — Progress Notes (Signed)
Peripherally Inserted Central Catheter Placement  The IV Nurse has discussed with the patient and/or persons authorized to consent for the patient, the purpose of this procedure and the potential benefits and risks involved with this procedure.  The benefits include less needle sticks, lab draws from the catheter, and the patient may be discharged home with the catheter. Risks include, but not limited to, infection, bleeding, blood clot (thrombus formation), and puncture of an artery; nerve damage and irregular heartbeat and possibility to perform a PICC exchange if needed/ordered by physician.  Alternatives to this procedure were also discussed.  Bard Power PICC patient education guide, fact sheet on infection prevention and patient information card has been provided to patient /or left at bedside.  PICC placed by Elenore Paddy, RN.  PICC Placement Documentation  PICC Single Lumen 11/22/22 Right Brachial 45 cm 0 cm (Active)  Indication for Insertion or Continuance of Line Home intravenous therapies (PICC only) 11/22/22 2211  Exposed Catheter (cm) 0 cm 11/22/22 2211  Site Assessment Clean, Dry, Intact 11/22/22 2211  Line Status Flushed;Saline locked;Blood return noted 11/22/22 2211  Dressing Type Transparent;Securing device 11/22/22 2211  Dressing Status Antimicrobial disc in place;Clean, Dry, Intact 11/22/22 2211  Line Care Connections checked and tightened 11/22/22 2211  Line Adjustment (NICU/IV Team Only) No 11/22/22 2211  Dressing Intervention New dressing;Adhesive placed at insertion site (IV team only) 11/22/22 2211  Dressing Change Due 11/29/22 11/22/22 2211       Deondra Wigger, Lajean Manes 11/22/2022, 10:38 PM

## 2022-11-22 NOTE — Progress Notes (Signed)
Subjective: 2 Days Post-Op Procedure(s) (LRB): Left shoulder open irrigation and debridement, arthroscopic irrigation and debridement and extensive debridment (Left) Left shoulder open irrigation and debridement, arthroscopic irrigation and debridement and extensive debridment (Left) Patient reports pain as mild.  His nerve block is still working. Patient is well, and has had no acute complaints or problems Plan is to go Home after hospital stay. Negative for chest pain and shortness of breath Fever: no Gastrointestinal: Negative for nausea and vomiting  Objective: Vital signs in last 24 hours: Temp:  [97.3 F (36.3 C)-98.2 F (36.8 C)] 97.3 F (36.3 C) (10/30 2345) Pulse Rate:  [67-73] 68 (10/30 2345) Resp:  [16-20] 20 (10/30 2345) BP: (117-143)/(78-96) 138/90 (10/30 2345) SpO2:  [93 %-97 %] 93 % (10/30 2345)  Intake/Output from previous day:  Intake/Output Summary (Last 24 hours) at 11/22/2022 0741 Last data filed at 11/22/2022 0037 Gross per 24 hour  Intake 140.31 ml  Output --  Net 140.31 ml    Intake/Output this shift: No intake/output data recorded.  Labs: Recent Labs    11/20/22 1457 11/21/22 0622  HGB 16.2 15.7   Recent Labs    11/20/22 1457 11/21/22 0622  WBC 7.5 11.1*  RBC 6.06* 5.79  HCT 50.9 48.8  PLT 160 173   Recent Labs    11/20/22 1457 11/21/22 0622 11/22/22 0516  NA 136 135  --   K 3.7 3.6  --   CL 102 103  --   CO2 25 22  --   BUN 19 17  --   CREATININE 1.21 1.19 1.32*  GLUCOSE 126* 114*  --   CALCIUM 8.4* 8.5*  --    No results for input(s): "LABPT", "INR" in the last 72 hours.   EXAM General - Patient is Alert and Oriented Extremity - Sensation intact distally Dorsiflexion/Plantar flexion intact Dressing/Incision - clean, dry, with the Hemovac removed with no complication.  Anterior and lateral bandages changed. Motor Function - intact, moving fingers and wrist well on exam.   Past Medical History:  Diagnosis Date    Acquired hypothyroidism    Allergic rhinitis    Anginal pain (HCC)    Aortic atherosclerosis (HCC)    Carpal tunnel syndrome on both sides    Carpal tunnel syndrome on left    a.) s/p release   Chronic lower back pain    CKD (chronic kidney disease), stage III (HCC)    Colloid LEFT thyroid nodule    Depression    Diastolic dysfunction    a.) TTE 10/19/2020: EF >55%; mild LVH, mile BAE, mild RV enlargement; triv AR/TR/PR, mild MR; G1DD.   Difficult airway for intubation    DOE (dyspnea on exertion)    Generalized anxiety disorder    GERD (gastroesophageal reflux disease)    Hepatic steatosis    Hyperlipidemia    Hypertension    Hypogonadism in male    a.) s/p Testim implant in 2014. b.) uses exogenous TD testosterone   Hypothyroidism    Migraines    Mild mitral regurgitation by prior echocardiogram    Occipital neuralgia    OSA on CPAP    Osteoarthritis    Pulmonary nodules    Renal cyst, left    T2DM (type 2 diabetes mellitus) (HCC)    Vitiligo    Wears hearing aid in both ears     Assessment/Plan: 2 Days Post-Op Procedure(s) (LRB): Left shoulder open irrigation and debridement, arthroscopic irrigation and debridement and extensive debridment (Left) Left shoulder open irrigation  and debridement, arthroscopic irrigation and debridement and extensive debridment (Left) Principal Problem:   Septic joint of left shoulder region [facility]) Active Problems:   Aortic atherosclerosis (HCC)   Depression   GERD (gastroesophageal reflux disease)   Diabetes mellitus type 2, noninsulin dependent (HCC)   Essential hypertension   Hypothyroidism   Hyperlipidemia   BPH (benign prostatic hyperplasia)  Estimated body mass index is 32.82 kg/m as calculated from the following:   Height as of this encounter: 6' (1.829 m).   Weight as of this encounter: 109.8 kg. Advance diet Up with therapy  Continue IV antibiotic therapy.  Hospitalist management.  Infectious disease  appreciated.  Discharge planning: Plan to follow-up at [facility] clinic orthopedics in 2 weeks for wound care.  Await probable PICC line placement for IV antibiotic therapy at home.  DVT Prophylaxis - TED hose Shoulder immobilizer to left arm at all times.  Plan to remove the Hemovac drain tomorrow.  Awaiting infectious disease consultation.  Dedra Skeens, PA-C Orthopaedic Surgery 11/22/2022, 7:41 AM

## 2022-11-22 NOTE — Plan of Care (Signed)
  Problem: Education: Goal: Ability to describe self-care measures that may prevent or decrease complications (Diabetes Survival Skills Education) will improve Outcome: Progressing Goal: Individualized Educational Video(s) Outcome: Progressing   Problem: Coping: Goal: Ability to adjust to condition or change in health will improve Outcome: Progressing   Problem: Fluid Volume: Goal: Ability to maintain a balanced intake and output will improve Outcome: Progressing   Problem: Health Behavior/Discharge Planning: Goal: Ability to identify and utilize available resources and services will improve Outcome: Progressing Goal: Ability to manage health-related needs will improve Outcome: Progressing   Problem: Nutritional: Goal: Maintenance of adequate nutrition will improve Outcome: Progressing Goal: Progress toward achieving an optimal weight will improve Outcome: Progressing   Problem: Skin Integrity: Goal: Risk for impaired skin integrity will decrease Outcome: Progressing   Problem: Tissue Perfusion: Goal: Adequacy of tissue perfusion will improve Outcome: Progressing   Problem: Education: Goal: Knowledge of General Education information will improve Description: Including pain rating scale, medication(s)/side effects and non-pharmacologic comfort measures Outcome: Progressing   Problem: Health Behavior/Discharge Planning: Goal: Ability to manage health-related needs will improve Outcome: Progressing   Problem: Clinical Measurements: Goal: Ability to maintain clinical measurements within normal limits will improve Outcome: Progressing Goal: Will remain free from infection Outcome: Progressing Goal: Diagnostic test results will improve Outcome: Progressing Goal: Respiratory complications will improve Outcome: Progressing Goal: Cardiovascular complication will be avoided Outcome: Progressing   Problem: Activity: Goal: Risk for activity intolerance will  decrease Outcome: Progressing   Problem: Nutrition: Goal: Adequate nutrition will be maintained Outcome: Progressing   Problem: Elimination: Goal: Will not experience complications related to bowel motility Outcome: Progressing Goal: Will not experience complications related to urinary retention Outcome: Progressing   Problem: Coping: Goal: Level of anxiety will decrease Outcome: Progressing   Problem: Pain Management: Goal: General experience of comfort will improve Outcome: Progressing

## 2022-11-22 NOTE — Progress Notes (Signed)
Date of Admission:  11/20/2022      ID: Jacob Lawson is a 67 y.o. male  Principal Problem:   Septic joint of left shoulder region [facility]) Active Problems:   Aortic atherosclerosis (HCC)   Depression   GERD (gastroesophageal reflux disease)   Diabetes mellitus type 2, noninsulin dependent (HCC)   Essential hypertension   Hypothyroidism   Hyperlipidemia   BPH (benign prostatic hyperplasia)    Subjective: Had drain removed from left shoulder No pain   Medications:   apixaban  5 mg Oral BID   carvedilol  12.5 mg Oral BID WC   cholecalciferol  5,000 Units Oral Daily   cyanocobalamin  1,000 mcg Oral Daily   finasteride  5 mg Oral Daily   insulin aspart  0-15 Units Subcutaneous TID WC   insulin aspart  0-5 Units Subcutaneous QHS   irbesartan  75 mg Oral Daily   levothyroxine  137 mcg Oral Q0600   pantoprazole  40 mg Oral Daily   rosuvastatin  5 mg Oral Q M,W,F   sertraline  100 mg Oral Daily   zinc sulfate  220 mg Oral Daily    Objective: Vital signs in last 24 hours: Patient Vitals for the past 24 hrs:  BP Temp Temp src Pulse Resp SpO2  11/22/22 0803 (!) 130/90 97.7 F (36.5 C) Oral 70 16 95 %  11/21/22 2345 (!) 138/90 (!) 97.3 F (36.3 C) Oral 68 20 93 %  11/21/22 1534 (!) 143/93 98.2 F (36.8 C) -- 69 16 97 %      PHYSICAL EXAM:  General: Alert, cooperative, no distress, appears stated age.  Head: Normocephalic, without obvious abnormality, atraumatic. Eyes: Conjunctivae clear, anicteric sclerae. Pupils are equal ENT Nares normal. No drainage or sinus tenderness. Lips, mucosa, and tongue normal. No Thrush Neck: Supple, symmetrical, no adenopathy, thyroid: non tender no carotid bruit and no JVD. Back: No CVA tenderness. Lungs: Clear to auscultation bilaterally. No Wheezing or Rhonchi. No rales. Heart: Regular rate and rhythm, no murmur, rub or gallop. Abdomen: Soft, non-tender,not distended. Bowel sounds normal. No masses Extremities: left arm in  sling Shoulder dressing Skin: No rashes or lesions. Or bruising Lymph: Cervical, supraclavicular normal. Neurologic: Grossly non-focal  Lab Results    Latest Ref Rng & Units 11/21/2022    6:22 AM 11/20/2022    2:57 PM 11/09/2022    1:28 PM  CBC  WBC 4.0 - 10.5 K/uL 11.1  7.5  7.7   Hemoglobin 13.0 - 17.0 g/dL 40.1  02.7  25.3   Hematocrit 39.0 - 52.0 % 48.8  50.9  55.4   Platelets 150 - 400 K/uL 173  160  201        Latest Ref Rng & Units 11/22/2022    5:16 AM 11/21/2022    6:22 AM 11/20/2022    2:57 PM  CMP  Glucose 70 - 99 mg/dL  664  403   BUN 8 - 23 mg/dL  17  19   Creatinine 4.74 - 1.24 mg/dL 2.59  5.63  8.75   Sodium 135 - 145 mmol/L  135  136   Potassium 3.5 - 5.1 mmol/L  3.6  3.7   Chloride 98 - 111 mmol/L  103  102   CO2 22 - 32 mmol/L  22  25   Calcium 8.9 - 10.3 mg/dL  8.5  8.4   Total Protein 6.5 - 8.1 g/dL   7.3   Total Bilirubin 0.3 - 1.2 mg/dL  0.7   Alkaline Phos 38 - 126 U/L   59   AST 15 - 41 U/L   23   ALT 0 - 44 U/L   22       Microbiology: Shoulder fluid culture- staph epidermidis From OR X2  From OP- propionibacterium acnes   Assessment/Plan: Left shoulder joint infection secondary to Propionibacterium acnes following  rotator cuff repair underwent debridement and removal of all the hardware.Also has staph epidermidis Patient is currently on vancomycin and ceftriaxone Will change vanco to dapto as Staph epidermidis could be MRSE ( if MSSE then we could cefazolin intead of dapto and ceftriaxone) The duration could be from 4 to 6 weeks.  We can do initial IV for 2 to 4 weeks followed by PO Explained to the patient and wife in great detail  HLD on crestor- hold while on dapto  HTN management as per primary team Pt waiting to have PICC  OPAT order placed Discussed with hospitalist Will follow him as OP

## 2022-11-22 NOTE — Care Management Important Message (Signed)
Important Message  Patient Details  Name: Jacob Lawson MRN: [id number] Date of Birth: 12-12-1955   Important Message Given:  N/A - LOS <3 / Initial given by admissions     Olegario Messier A Leticia Mcdiarmid 11/22/2022, 9:48 AM

## 2022-11-23 DIAGNOSIS — M009 Pyogenic arthritis, unspecified: Secondary | ICD-10-CM | POA: Diagnosis not present

## 2022-11-23 LAB — CBC WITH DIFFERENTIAL/PLATELET
Abs Immature Granulocytes: 0.07 10*3/uL (ref 0.00–0.07)
Basophils Absolute: 0.1 10*3/uL (ref 0.0–0.1)
Basophils Relative: 1 %
Eosinophils Absolute: 0.2 10*3/uL (ref 0.0–0.5)
Eosinophils Relative: 2 %
HCT: 45 % (ref 39.0–52.0)
Hemoglobin: 14.7 g/dL (ref 13.0–17.0)
Immature Granulocytes: 1 %
Lymphocytes Relative: 22 %
Lymphs Abs: 1.8 10*3/uL (ref 0.7–4.0)
MCH: 27.6 pg (ref 26.0–34.0)
MCHC: 32.7 g/dL (ref 30.0–36.0)
MCV: 84.6 fL (ref 80.0–100.0)
Monocytes Absolute: 1 10*3/uL (ref 0.1–1.0)
Monocytes Relative: 12 %
Neutro Abs: 5.1 10*3/uL (ref 1.7–7.7)
Neutrophils Relative %: 62 %
Platelets: 165 10*3/uL (ref 150–400)
RBC: 5.32 MIL/uL (ref 4.22–5.81)
RDW: 15.4 % (ref 11.5–15.5)
WBC: 8.3 10*3/uL (ref 4.0–10.5)
nRBC: 0 % (ref 0.0–0.2)

## 2022-11-23 LAB — CK: Total CK: 217 U/L (ref 49–397)

## 2022-11-23 LAB — BASIC METABOLIC PANEL
Anion gap: 7 (ref 5–15)
BUN: 19 mg/dL (ref 8–23)
CO2: 25 mmol/L (ref 22–32)
Calcium: 7.9 mg/dL — ABNORMAL LOW (ref 8.9–10.3)
Chloride: 103 mmol/L (ref 98–111)
Creatinine, Ser: 1.15 mg/dL (ref 0.61–1.24)
GFR, Estimated: >60 mL/min (ref >60)
Glucose, Bld: 105 mg/dL — ABNORMAL HIGH (ref 70–99)
Potassium: 3.4 mmol/L — ABNORMAL LOW (ref 3.5–5.1)
Sodium: 135 mmol/L (ref 135–145)

## 2022-11-23 LAB — GLUCOSE, CAPILLARY
Glucose-Capillary: 107 mg/dL — ABNORMAL HIGH (ref 70–99)
Glucose-Capillary: 117 mg/dL — ABNORMAL HIGH (ref 70–99)

## 2022-11-23 MED ORDER — DAPTOMYCIN-SODIUM CHLORIDE 700-0.9 MG/100ML-% IV SOLN
700.0000 mg | Freq: Every day | INTRAVENOUS | Status: DC
  Administered 2022-11-23: 700 mg via INTRAVENOUS
  Filled 2022-11-23 (×3): qty 100

## 2022-11-23 MED ORDER — DAPTOMYCIN IV (FOR PTA / DISCHARGE USE ONLY): 700.0000 mg | INTRAVENOUS | 0 refills | Status: AC

## 2022-11-23 MED ORDER — OXYCODONE HCL 5 MG PO TABS: 5.0000 mg | ORAL_TABLET | ORAL | 0 refills | Status: DC | PRN

## 2022-11-23 MED ORDER — CEFTRIAXONE IV (FOR PTA / DISCHARGE USE ONLY): 2.0000 g | INTRAVENOUS | 0 refills | Status: AC

## 2022-11-23 NOTE — Progress Notes (Signed)
Reviewed discharge instructions with pt. Pt verbalized understanding. Pt discharge with single PICC lumen  on right upper arm for home ABT and all  personal belongings. Staff walked pt out. Pt transported to home via family private car.

## 2022-11-23 NOTE — Plan of Care (Signed)
Problem: Education: Goal: Ability to describe self-care measures that may prevent or decrease complications (Diabetes Survival Skills Education) will improve 11/23/2022 1659 by Myla Mauriello Bet, LPN Outcome: Adequate for Discharge 11/23/2022 1659 by Ester Hilley Bet, LPN Outcome: Progressing 11/23/2022 1400 by Melina Mosteller Bet, LPN Outcome: Progressing Goal: Individualized Educational Video(s) 11/23/2022 1659 by Jakaiden Fill Bet, LPN Outcome: Adequate for Discharge 11/23/2022 1659 by Shemicka Cohrs Bet, LPN Outcome: Progressing 11/23/2022 1400 by Christena Sunderlin Bet, LPN Outcome: Progressing   Problem: Coping: Goal: Ability to adjust to condition or change in health will improve 11/23/2022 1659 by Laberta Wilbon Bet, LPN Outcome: Adequate for Discharge 11/23/2022 1659 by Anessia Oakland Bet, LPN Outcome: Progressing 11/23/2022 1400 by Lyndee Herbst Bet, LPN Outcome: Progressing   Problem: Fluid Volume: Goal: Ability to maintain a balanced intake and output will improve 11/23/2022 1659 by Maryelizabeth Eberle Bet, LPN Outcome: Adequate for Discharge 11/23/2022 1659 by Nylen Creque Bet, LPN Outcome: Progressing 11/23/2022 1400 by Bernita Beckstrom Bet, LPN Outcome: Progressing   Problem: Health Behavior/Discharge Planning: Goal: Ability to identify and utilize available resources and services will improve 11/23/2022 1659 by Aymara Sassi Bet, LPN Outcome: Adequate for Discharge 11/23/2022 1659 by Semaja Lymon Bet, LPN Outcome: Progressing 11/23/2022 1400 by Miro Balderson Bet, LPN Outcome: Progressing Goal: Ability to manage health-related needs will improve 11/23/2022 1659 by Julisa Flippo Bet, LPN Outcome: Adequate for Discharge 11/23/2022 1659 by Jenavive Lamboy Bet, LPN Outcome: Progressing 11/23/2022 1400 by Devereaux Grayson Bet, LPN Outcome: Progressing   Problem: Metabolic: Goal: Ability to maintain appropriate glucose levels will improve 11/23/2022 1659 by Paizley Ramella Bet, LPN Outcome: Adequate for Discharge 11/23/2022 1659 by Mycah Mcdougall Bet,  LPN Outcome: Progressing 11/23/2022 1400 by Aundre Hietala Bet, LPN Outcome: Progressing   Problem: Nutritional: Goal: Maintenance of adequate nutrition will improve 11/23/2022 1659 by Pedrohenrique Mcconville Bet, LPN Outcome: Adequate for Discharge 11/23/2022 1659 by Sindhu Nguyen Bet, LPN Outcome: Progressing 11/23/2022 1400 by Fergus Throne Bet, LPN Outcome: Progressing Goal: Progress toward achieving an optimal weight will improve 11/23/2022 1659 by Shene Maxfield Bet, LPN Outcome: Adequate for Discharge 11/23/2022 1659 by Turner Baillie Bet, LPN Outcome: Progressing 11/23/2022 1400 by Quan Cybulski Bet, LPN Outcome: Progressing   Problem: Skin Integrity: Goal: Risk for impaired skin integrity will decrease 11/23/2022 1659 by Morissa Obeirne Bet, LPN Outcome: Adequate for Discharge 11/23/2022 1659 by Brenlyn Beshara Bet, LPN Outcome: Progressing 11/23/2022 1400 by Analyssa Downs Bet, LPN Outcome: Progressing   Problem: Tissue Perfusion: Goal: Adequacy of tissue perfusion will improve 11/23/2022 1659 by Naelani Lafrance Bet, LPN Outcome: Adequate for Discharge 11/23/2022 1659 by Arica Bevilacqua Bet, LPN Outcome: Progressing 11/23/2022 1400 by Alec Mcphee Bet, LPN Outcome: Progressing   Problem: Education: Goal: Knowledge of General Education information will improve Description: Including pain rating scale, medication(s)/side effects and non-pharmacologic comfort measures 11/23/2022 1659 by Chrystian Cupples Bet, LPN Outcome: Adequate for Discharge 11/23/2022 1659 by Deamber Buckhalter Bet, LPN Outcome: Progressing 11/23/2022 1400 by Dannel Rafter Bet, LPN Outcome: Progressing   Problem: Health Behavior/Discharge Planning: Goal: Ability to manage health-related needs will improve 11/23/2022 1659 by Marylynn Rigdon Bet, LPN Outcome: Adequate for Discharge 11/23/2022 1659 by Swathi Dauphin Bet, LPN Outcome: Progressing 11/23/2022 1400 by Maryellen Dowdle Bet, LPN Outcome: Progressing   Problem: Clinical Measurements: Goal: Ability to maintain clinical measurements  within normal limits will improve 11/23/2022 1659 by Mauriah Mcmillen Bet, LPN Outcome: Adequate for Discharge 11/23/2022 1659 by Livana Yerian Bet, LPN Outcome: Progressing 11/23/2022 1400 by Alanda Colton Bet, LPN Outcome:  Progressing Goal: Will remain free from infection 11/23/2022 1659 by Kylei Purington Bet, LPN Outcome: Adequate for Discharge 11/23/2022 1659 by Caprice Mccaffrey Bet, LPN Outcome: Progressing 11/23/2022 1400 by Chamara Dyck Bet, LPN Outcome: Progressing Goal: Diagnostic test results will improve 11/23/2022 1659 by Mathilde Mcwherter Bet, LPN Outcome: Adequate for Discharge 11/23/2022 1659 by Emersynn Deatley Bet, LPN Outcome: Progressing 11/23/2022 1400 by Luca Dyar Bet, LPN Outcome: Progressing Goal: Respiratory complications will improve 11/23/2022 1659 by Jaidan Prevette Bet, LPN Outcome: Adequate for Discharge 11/23/2022 1659 by Marbella Markgraf Bet, LPN Outcome: Progressing 11/23/2022 1400 by Aviv Rota Bet, LPN Outcome: Progressing Goal: Cardiovascular complication will be avoided 11/23/2022 1659 by Kieley Akter Bet, LPN Outcome: Adequate for Discharge 11/23/2022 1659 by Abanoub Hanken Bet, LPN Outcome: Progressing 11/23/2022 1400 by Eliyah Bazzi Bet, LPN Outcome: Progressing   Problem: Activity: Goal: Risk for activity intolerance will decrease 11/23/2022 1659 by Darrik Richman Bet, LPN Outcome: Adequate for Discharge 11/23/2022 1659 by Gerritt Galentine Bet, LPN Outcome: Progressing 11/23/2022 1400 by Nicolis Boody Bet, LPN Outcome: Progressing   Problem: Nutrition: Goal: Adequate nutrition will be maintained 11/23/2022 1659 by Tatem Fesler Bet, LPN Outcome: Adequate for Discharge 11/23/2022 1659 by Nadalyn Deringer Bet, LPN Outcome: Progressing 11/23/2022 1400 by Kenlea Woodell Bet, LPN Outcome: Progressing   Problem: Coping: Goal: Level of anxiety will decrease 11/23/2022 1659 by Alizeh Madril Bet, LPN Outcome: Adequate for Discharge 11/23/2022 1659 by Artin Mceuen Bet, LPN Outcome: Progressing 11/23/2022 1400 by Nesha Counihan Bet,  LPN Outcome: Progressing   Problem: Elimination: Goal: Will not experience complications related to bowel motility 11/23/2022 1659 by Normand Damron Bet, LPN Outcome: Adequate for Discharge 11/23/2022 1659 by Briceson Broadwater Bet, LPN Outcome: Progressing 11/23/2022 1400 by Myrtis Maille Bet, LPN Outcome: Progressing Goal: Will not experience complications related to urinary retention 11/23/2022 1659 by Elvin Mccartin Bet, LPN Outcome: Adequate for Discharge 11/23/2022 1659 by Nia Nathaniel Bet, LPN Outcome: Progressing 11/23/2022 1400 by Jossiah Smoak Bet, LPN Outcome: Progressing   Problem: Pain Management: Goal: General experience of comfort will improve 11/23/2022 1659 by Pierina Schuknecht Bet, LPN Outcome: Adequate for Discharge 11/23/2022 1659 by Rylin Seavey Bet, LPN Outcome: Progressing 11/23/2022 1400 by Torre Pikus Bet, LPN Outcome: Progressing   Problem: Safety: Goal: Ability to remain free from injury will improve 11/23/2022 1659 by Nehemiah Mcfarren Bet, LPN Outcome: Adequate for Discharge 11/23/2022 1659 by Shayn Madole Bet, LPN Outcome: Progressing 11/23/2022 1400 by Carola Viramontes Bet, LPN Outcome: Progressing   Problem: Skin Integrity: Goal: Risk for impaired skin integrity will decrease 11/23/2022 1659 by Ken Bonn Bet, LPN Outcome: Adequate for Discharge 11/23/2022 1659 by Jo-Ann Johanning Bet, LPN Outcome: Progressing 11/23/2022 1400 by Amere Bricco Bet, LPN Outcome: Progressing

## 2022-11-23 NOTE — Plan of Care (Signed)
  Problem: Coping: Goal: Ability to adjust to condition or change in health will improve Outcome: Progressing   Problem: Fluid Volume: Goal: Ability to maintain a balanced intake and output will improve Outcome: Progressing   Problem: Health Behavior/Discharge Planning: Goal: Ability to identify and utilize available resources and services will improve Outcome: Progressing Goal: Ability to manage health-related needs will improve Outcome: Progressing   Problem: Nutritional: Goal: Maintenance of adequate nutrition will improve Outcome: Progressing

## 2022-11-23 NOTE — Plan of Care (Signed)
  Problem: Safety: Goal: Ability to remain free from injury will improve Outcome: Progressing   Problem: Pain Management: Goal: General experience of comfort will improve Outcome: Progressing   Problem: Elimination: Goal: Will not experience complications related to bowel motility Outcome: Progressing   Problem: Skin Integrity: Goal: Risk for impaired skin integrity will decrease Outcome: Progressing   Problem: Activity: Goal: Risk for activity intolerance will decrease Outcome: Progressing

## 2022-11-23 NOTE — Progress Notes (Addendum)
PROGRESS NOTE    Jacob Lawson  LOV:[id number] DOB: 10-24-1955 DOA: 11/20/2022 PCP: Mick Sell, MD    Brief Narrative:   67 year old male with history of paroxysmal atrial fibrillation, hypertension, hyperlipidemia, obesity, non-insulin-dependent diabetes mellitus, CKD 3A, who is a direct admission with orthopedic service for left arthroscopic irrigation debridement of glenohumeral joint.   Hospitalist service was consulted for direct admission concerning for septic joint. Status post OR with orthopedics on 10/29.  Status post left open irrigation and debridement.  Tolerated procedure well.     Assessment & Plan:   Principal Problem:   Septic joint of left shoulder region [facility]) Active Problems:   Aortic atherosclerosis (HCC)   Depression   GERD (gastroesophageal reflux disease)   Diabetes mellitus type 2, noninsulin dependent (HCC)   Essential hypertension   Hypothyroidism   Hyperlipidemia   BPH (benign prostatic hyperplasia)  * Septic joint of left shoulder region (HCC) IntraOp culture have been collected and are in process Infectious disease specialist has been consulted, appreciate further recommendations Status post wound VAC in place Plan: Continue broad-spectrum IV antibiotics.  PICC line to be placed.  ID consulted for outpatient antibiotic orders.  Monitor cultures    Hyperlipidemia PTA statin   Hypothyroidism PTA send   Essential hypertension Amlodipine 5 mg daily Hydrochlorothiazide 25 mg daily Lisinopril 40 mg daily Metoprolol succinate 50 mg daily Aldactone 25 mg daily Torsemide 10 mg daily   Diabetes mellitus type 2, noninsulin dependent (HCC) Patient last had A1c on 09/04/2022: 6.5, average glucose reading of 140 Heart diet Insulin SSI with at bedtime coverage ordered Goal inpatient blood glucose level is 140-180 Sugars well-controlled   DVT prophylaxis: Eliquis Code Status: Full Family Communication: Spouse at bedside  10/30 Disposition Plan: Status is: Inpatient Remains inpatient appropriate because: Septic joint on IV antibiotics   Level of care: Med-Surg  Consultants:  Orthopedic Infectious disease Procedures:  Left shoulder operative debridement  Antimicrobials: Vancomycin Ceftriaxone   Subjective: Seen and examined.  Pain well-controlled.  No complaints  Objective: Vitals:   11/22/22 1618 11/23/22 0018 11/23/22 0741 11/23/22 1133  BP: 122/73 (!) 150/83 (!) 172/86 (!) 184/97  Pulse: 92 (!) 57 67 (!) 47  Resp: 17 16 18 16   Temp: 98.3 F (36.8 C) 98.2 F (36.8 C) 97.6 F (36.4 C) 97.7 F (36.5 C)  TempSrc:   Oral   SpO2: 97% 96% 97% 96%  Weight:      Height:        Intake/Output Summary (Last 24 hours) at 11/23/2022 1142 Last data filed at 11/23/2022 0036 Gross per 24 hour  Intake 771.87 ml  Output --  Net 771.87 ml   Filed Weights   11/20/22 0950  Weight: 109.8 kg    Examination:  General exam: NAD Respiratory system: Lungs clear.  Normal work of breathing.  Room air. Cardiovascular system: S1-S2, RRR, no murmurs, no pedal edema Gastrointestinal system: Soft, NT/ND, normal bowel sounds Central nervous system: Alert and oriented. No focal neurological deficits. Extremities: Left shoulder in sling.  Wound VAC in place Skin: No rashes, lesions or ulcers Psychiatry: Judgement and insight appear normal. Mood & affect appropriate.     Data Reviewed: I have personally reviewed following labs and imaging studies  CBC: Recent Labs  Lab 11/20/22 1457 11/21/22 0622 11/23/22 0225  WBC 7.5 11.1* 8.3  NEUTROABS 6.2  --  5.1  HGB 16.2 15.7 14.7  HCT 50.9 48.8 45.0  MCV 84.0 84.3 84.6  PLT 160 173 165  Basic Metabolic Panel: Recent Labs  Lab 11/20/22 1457 11/21/22 0622 11/22/22 0516 11/23/22 0225  NA 136 135  --  135  K 3.7 3.6  --  3.4*  CL 102 103  --  103  CO2 25 22  --  25  GLUCOSE 126* 114*  --  105*  BUN 19 17  --  19  CREATININE 1.21 1.19 1.32*  1.15  CALCIUM 8.4* 8.5*  --  7.9*   GFR: Estimated Creatinine Clearance: 79.8 mL/min (by C-G formula based on SCr of 1.15 mg/dL). Liver Function Tests: Recent Labs  Lab 11/20/22 1457  AST 23  ALT 22  ALKPHOS 59  BILITOT 0.7  PROT 7.3  ALBUMIN 4.0   No results for input(s): "LIPASE", "AMYLASE" in the last 168 hours. No results for input(s): "AMMONIA" in the last 168 hours. Coagulation Profile: No results for input(s): "INR", "PROTIME" in the last 168 hours. Cardiac Enzymes: Recent Labs  Lab 11/23/22 0225  CKTOTAL 217   BNP (last 3 results) No results for input(s): "PROBNP" in the last 8760 hours. HbA1C: Recent Labs    11/21/22 0622  HGBA1C 6.1*   CBG: Recent Labs  Lab 11/22/22 1229 11/22/22 1617 11/22/22 2111 11/23/22 0743 11/23/22 1135  GLUCAP 87 183* 160* 107* 117*   Lipid Profile: No results for input(s): "CHOL", "HDL", "LDLCALC", "TRIG", "CHOLHDL", "LDLDIRECT" in the last 72 hours. Thyroid Function Tests: No results for input(s): "TSH", "T4TOTAL", "FREET4", "T3FREE", "THYROIDAB" in the last 72 hours. Anemia Panel: No results for input(s): "VITAMINB12", "FOLATE", "FERRITIN", "TIBC", "IRON", "RETICCTPCT" in the last 72 hours. Sepsis Labs: No results for input(s): "PROCALCITON", "LATICACIDVEN" in the last 168 hours.  Recent Results (from the past 240 hour(s))  Aerobic/Anaerobic Culture w Gram Stain (surgical/deep wound)     Status: None (Preliminary result)   Collection Time: 11/20/22 11:35 AM   Specimen: Path fluid; Body Fluid  Result Value Ref Range Status   Specimen Description   Final    FLUID Performed at [facility], [address] [place], [place] [address]    Special Requests LEFT SHOULDER ANTERIOR WOUND 1  Final   Gram Stain   Final    RARE WBC PRESENT,BOTH PMN AND MONONUCLEAR NO ORGANISMS SEEN    Culture   Final    NO GROWTH 3 DAYS NO ANAEROBES ISOLATED; CULTURE IN PROGRESS FOR 5 DAYS Performed at [facility], [address], [place], [place] [address]    Report Status PENDING  Incomplete  Aerobic/Anaerobic Culture w Gram Stain (surgical/deep wound)     Status: None (Preliminary result)   Collection Time: 11/20/22 11:43 AM   Specimen: Path fluid; Body Fluid  Result Value Ref Range Status   Specimen Description   Final    FLUID Performed at [facility], [address] [place], [place] [address]    Special Requests LEFT SHOULDER ANTERIO WOUND 2  Final   Gram Stain   Final    RARE WBC PRESENT,BOTH PMN AND MONONUCLEAR NO ORGANISMS SEEN Performed at [facility], [address], [place], [place] [address]    Culture   Final    RARE STAPHYLOCOCCUS EPIDERMIDIS SUSCEPTIBILITIES TO FOLLOW NO ANAEROBES ISOLATED; CULTURE IN PROGRESS FOR 5 DAYS    Report Status PENDING  Incomplete  Aerobic/Anaerobic Culture w Gram Stain (surgical/deep wound)     Status: None (Preliminary result)   Collection Time: 11/20/22 11:47 AM   Specimen: Path fluid; Body Fluid  Result Value Ref Range  Status   Specimen Description   Final    FLUID Performed at [facility], [address] [place], [place] [address]    Special Requests LEFT SHOULDER GLENOHUMERAL JOINT  Final   Gram Stain   Final    FEW WBC PRESENT,BOTH PMN AND MONONUCLEAR RARE GRAM POSITIVE COCCI IN PAIRS    Culture   Final    NO GROWTH 3 DAYS NO ANAEROBES ISOLATED; CULTURE IN PROGRESS FOR 5 DAYS Performed at [facility], [address], [place], [place] [address]    Report Status PENDING  Incomplete  Aerobic/Anaerobic Culture w Gram Stain (surgical/deep wound)     Status: None (Preliminary result)   Collection Time: 11/20/22 11:48 AM   Specimen: Path Tissue  Result Value Ref Range Status   Specimen Description   Final    TISSUE Performed at [facility], [address], [place], [place] [address]    Special Requests LEFT SHOULDER GLENOHUMERAL JOINT.  Final   Gram Stain NO WBC SEEN NO ORGANISMS SEEN   Final    Culture   Final    NO GROWTH 3 DAYS NO ANAEROBES ISOLATED; CULTURE IN PROGRESS FOR 5 DAYS Performed at [facility], [address] [place], [place] [address]    Report Status PENDING  Incomplete         Radiology Studies: DG Chest Port 1 View  Result Date: 11/22/2022 CLINICAL DATA:  Status post PICC placement EXAM: PORTABLE CHEST 1 VIEW COMPARISON:  11/07/2018 FINDINGS: Stable cardiomediastinal silhouette. Increased pulmonary vascularity. Left basilar atelectasis or consolidation. Question small left pleural effusion. Right PICC tip at the superior cavoatrial junction. No pneumothorax. IMPRESSION: 1. Right PICC tip at the superior cavoatrial junction. 2. Left basilar atelectasis or consolidation. Electronically Signed   By: Minerva Fester M.D.   On: 11/22/2022 23:13   [place] EKG SITE RITE  Result Date: 11/22/2022 If Site Rite image not attached, placement could not be confirmed due to current cardiac rhythm.       Scheduled Meds:  apixaban  5 mg Oral BID   carvedilol  12.5 mg Oral BID WC   Chlorhexidine Gluconate Cloth  6 each Topical Daily   cholecalciferol  5,000 Units Oral Daily   cyanocobalamin  1,000 mcg Oral Daily   finasteride  5 mg Oral Daily   insulin aspart  0-15 Units Subcutaneous TID WC   insulin aspart  0-5 Units Subcutaneous QHS   irbesartan  75 mg Oral Daily   levothyroxine  137 mcg Oral Q0600   pantoprazole  40 mg Oral Daily   sertraline  100 mg Oral Daily   sodium chloride flush  10-40 mL Intracatheter Q12H   zinc sulfate  220 mg Oral Daily   Continuous Infusions:  cefTRIAXone (ROCEPHIN)  IV 2 g (11/23/22 1129)   DAPTOmycin       LOS: 3 days     Tresa Moore, MD [facility]   If 7PM-7AM, please contact night-coverage  11/23/2022, 11:42 AM

## 2022-11-23 NOTE — TOC Transition Note (Signed)
Transition of Care [facility]) - CM/SW Discharge Note   Patient Details  Name: Jacob Lawson MRN: [id number] Date of Birth: April 01, 1955  Transition of Care [facility]) CM/SW Contact:  Garret Reddish, RN Phone Number: 11/23/2022, 3:01 PM   Clinical Narrative:     Chart reviewed.  Noted that patient patient has orders for discharge home today.  Patient will need home infusion for Septic joint of left shoulder region.  I have spoken with Pam with [employer].  She will be providing home infusion medications for patient on discharge.  Pam will also provide home teaching for patient today between 2-3 pm.  I have spoken with patient about home health services for home health RN.  Patient did not have preference.  I have asked Kandee Keen with [facility] to accept home health RN referral for home infusion.    I have informed staff nurse of the above information.    Final next level of care: Home w Home Health Services Barriers to Discharge: No Barriers Identified   Patient Goals and CMS Choice CMS Medicare.gov Compare Post Acute Care list provided to:: Patient Choice offered to / list presented to : Patient  Discharge Placement                    Name of family member notified: Mrs. Fissel Patient and family notified of of transfer: 11/23/22  Discharge Plan and Services Additional resources added to the After Visit Summary for                            [facility] Arranged: RN [facility] Agency: [facility] ([facility] will provide medication for IV medicaitons and [facility] will provide home health nursing.) Date [facility] Agency Contacted: 11/23/22   Representative spoke with at [facility] Agency: Kandee Keen  Social Determinants of Health (SDOH) Interventions SDOH Screenings   Food Insecurity: No Food Insecurity (11/20/2022)  Housing: Low Risk  (11/20/2022)  Transportation Needs: No Transportation Needs (11/20/2022)  Utilities: Not At Risk (11/20/2022)  Financial Resource Strain: Low Risk  (08/30/2022)   Received from  [facility]  Tobacco Use: Low Risk  (11/20/2022)     Readmission Risk Interventions     No data to display

## 2022-11-23 NOTE — Progress Notes (Signed)
Subjective: 3 Days Post-Op Procedure(s) (LRB): Left shoulder open irrigation and debridement, arthroscopic irrigation and debridement and extensive debridment (Left) Left shoulder open irrigation and debridement, arthroscopic irrigation and debridement and extensive debridment (Left) Patient reports pain as mild.   Patient is well, and has had no acute complaints or problems Plan is to go Home after hospital stay. Negative for chest pain and shortness of breath Fever: no Gastrointestinal: Negative for nausea and vomiting  Objective: Vital signs in last 24 hours: Temp:  [97.7 F (36.5 C)-98.3 F (36.8 C)] 98.2 F (36.8 C) (11/01 0018) Pulse Rate:  [57-92] 57 (11/01 0018) Resp:  [16-17] 16 (11/01 0018) BP: (122-150)/(73-90) 150/83 (11/01 0018) SpO2:  [95 %-97 %] 96 % (11/01 0018)  Intake/Output from previous day:  Intake/Output Summary (Last 24 hours) at 11/23/2022 0648 Last data filed at 11/23/2022 0036 Gross per 24 hour  Intake 1131.87 ml  Output --  Net 1131.87 ml    Intake/Output this shift: Total I/O In: 771.9 [IV Piggyback:771.9] Out: -   Labs: Recent Labs    11/20/22 1457 11/21/22 0622 11/23/22 0225  HGB 16.2 15.7 14.7   Recent Labs    11/21/22 0622 11/23/22 0225  WBC 11.1* 8.3  RBC 5.79 5.32  HCT 48.8 45.0  PLT 173 165   Recent Labs    11/21/22 0622 11/22/22 0516 11/23/22 0225  NA 135  --  135  K 3.6  --  3.4*  CL 103  --  103  CO2 22  --  25  BUN 17  --  19  CREATININE 1.19 1.32* 1.15  GLUCOSE 114*  --  105*  CALCIUM 8.5*  --  7.9*   No results for input(s): "LABPT", "INR" in the last 72 hours.   EXAM General - Patient is Alert and Oriented Extremity - Sensation intact distally Dorsiflexion/Plantar flexion intact Dressing/Incision - clean, dry, with the Hemovac removed with no complication.  Anterior and lateral bandages changed. Motor Function - intact, moving fingers and wrist well on exam.   Past Medical History:  Diagnosis Date    Acquired hypothyroidism    Allergic rhinitis    Anginal pain (HCC)    Aortic atherosclerosis (HCC)    Carpal tunnel syndrome on both sides    Carpal tunnel syndrome on left    a.) s/p release   Chronic lower back pain    CKD (chronic kidney disease), stage III (HCC)    Colloid LEFT thyroid nodule    Depression    Diastolic dysfunction    a.) TTE 10/19/2020: EF >55%; mild LVH, mile BAE, mild RV enlargement; triv AR/TR/PR, mild MR; G1DD.   Difficult airway for intubation    DOE (dyspnea on exertion)    Generalized anxiety disorder    GERD (gastroesophageal reflux disease)    Hepatic steatosis    Hyperlipidemia    Hypertension    Hypogonadism in male    a.) s/p Testim implant in 2014. b.) uses exogenous TD testosterone   Hypothyroidism    Migraines    Mild mitral regurgitation by prior echocardiogram    Occipital neuralgia    OSA on CPAP    Osteoarthritis    Pulmonary nodules    Renal cyst, left    T2DM (type 2 diabetes mellitus) (HCC)    Vitiligo    Wears hearing aid in both ears     Assessment/Plan: 3 Days Post-Op Procedure(s) (LRB): Left shoulder open irrigation and debridement, arthroscopic irrigation and debridement and extensive debridment (Left) Left  shoulder open irrigation and debridement, arthroscopic irrigation and debridement and extensive debridment (Left) Principal Problem:   Septic joint of left shoulder region [facility]) Active Problems:   Aortic atherosclerosis (HCC)   Depression   GERD (gastroesophageal reflux disease)   Diabetes mellitus type 2, noninsulin dependent (HCC)   Essential hypertension   Hypothyroidism   Hyperlipidemia   BPH (benign prostatic hyperplasia)  Estimated body mass index is 32.82 kg/m as calculated from the following:   Height as of this encounter: 6' (1.829 m).   Weight as of this encounter: 109.8 kg. Advance diet Up with therapy  Continue IV antibiotic therapy.  Hospitalist management.  Infectious disease  appreciated.  Discharge planning: Plan to follow-up at [facility] clinic orthopedics in 2 weeks for wound care.  PICC line placed and discharge meds ordered.  DVT Prophylaxis - TED hose Shoulder immobilizer to left arm at all times.  Plan to remove the Hemovac drain tomorrow.  Awaiting infectious disease consultation.  Dedra Skeens, PA-C Orthopaedic Surgery 11/23/2022, 6:48 AM

## 2022-11-23 NOTE — Care Management Important Message (Signed)
Important Message  Patient Details  Name: Jacob Lawson MRN: [id number] Date of Birth: [birth year]   Important Message Given:  Yes - Medicare IM     Olegario Messier A Myrta Mercer 11/23/2022, 11:24 AM

## 2022-11-23 NOTE — Progress Notes (Signed)
OPAT Order Details             Outpatient Parenteral Antibiotic Therapy Consult  Until discontinued       Comments: Dustin to pend it tomorrow  Provider:  (Not yet assigned)  Question Answer Comment  Antibiotic Ceftriaxone (Rocephin) IVPB   Antibiotic Daptomycin (Cubicin) IVPB   Indications for use septic arthritis   End Date 12/22/2022   Approving ID Provider's Name Ravishankar                Discharge antibiotics: Daptomycin 700 mg IV every 24 hours - Duration will be decided after final culture result- please give 7 day supply  (may change antibiotic depending on final culture result) Ceftriaxone 2 grams IV every 24 hours X 30 days    HOLD crestor while on Daptomycin  Duration: 30 days  End Date:12/22/22   Labs weekly: CBC/D, CK, CMP, CRP, ESR Pull PIC at completion of IV antibiotics Fax weekly lab results  promptly to [phone]  Thank you for allowing pharmacy to participate in this patient's care.  Effie Shy, PharmD Pharmacy Resident  11/23/2022 8:17 AM

## 2022-11-23 NOTE — Discharge Summary (Signed)
Physician Discharge Summary  Jacob Lawson MVH:[id number] DOB: 11-21-1955 DOA: 11/20/2022  PCP: Mick Sell, MD  Admit date: 11/20/2022 Discharge date: 11/23/2022  Admitted From: Home Disposition:  Home  Recommendations for Outpatient Follow-up:  Follow up with PCP in 1-2 weeks   Home Health:No Equipment/Devices:RUE PICC   Discharge Condition:Stable  CODE STATUS:DNR  Diet recommendation: Reg  Brief/Interim Summary:   67 year old male with history of paroxysmal atrial fibrillation, hypertension, hyperlipidemia, obesity, non-insulin-dependent diabetes mellitus, CKD 3A, who is a direct admission with orthopedic service for left arthroscopic irrigation debridement of glenohumeral joint.   Hospitalist service was consulted for direct admission concerning for septic joint. Status post OR with orthopedics on 10/29.  Status post left open irrigation and debridement.  Tolerated procedure well.       Discharge Diagnoses:  Principal Problem:   Septic joint of left shoulder region [facility]) Active Problems:   Aortic atherosclerosis (HCC)   Depression   GERD (gastroesophageal reflux disease)   Diabetes mellitus type 2, noninsulin dependent (HCC)   Essential hypertension   Hypothyroidism   Hyperlipidemia   BPH (benign prostatic hyperplasia)  * Septic joint of left shoulder region (HCC) IntraOp culture have been collected and are in process Infectious disease specialist has been consulted, appreciate further recommendations Drain removed Plan: Discharge home.  Daptomycin and Rocephin x 4 to 6 weeks per ID recommendations.  PICC line placed in right upper extremity.  Teaching done prior to discharge.  Patient will follow-up with orthopedics, PCP, ID post DC.    Discharge Instructions  Discharge Instructions     [facility] pharmacist to adjust dose for Vancomycin, Aminoglycosides and other anti-infective therapies as requested by physician.   Complete by: As  directed    [facility] to provide Cath Flo 2mg    Complete by: As directed    Administer for PICC line occlusion and as ordered by physician for other access device issues.   Anaphylaxis Kit: Provided to treat any anaphylactic reaction to the medication being provided to the patient if First Dose or when requested by physician   Complete by: As directed    Epinephrine 1mg /ml vial / amp: Administer 0.3mg  (0.18ml) subcutaneously once for moderate to severe anaphylaxis, nurse to call physician and pharmacy when reaction occurs and call 911 if needed for immediate care   Diphenhydramine 50mg /ml IV vial: Administer 25-50mg  IV/IM PRN for first dose reaction, rash, itching, mild reaction, nurse to call physician and pharmacy when reaction occurs   Sodium Chloride 0.9% NS IV: Administer if needed for hypovolemic blood pressure drop or as ordered by physician after call to physician with anaphylactic reaction   Change dressing on IV access line weekly and PRN   Complete by: As directed    Diet - low sodium heart healthy   Complete by: As directed    Flush IV access with Sodium Chloride 0.9% and Heparin 10 units/ml or 100 units/ml   Complete by: As directed    Home infusion instructions - [facility]   Complete by: As directed    Instructions: Flush IV access with Sodium Chloride 0.9% and Heparin 10units/ml or 100units/ml   Change dressing on IV access line: Weekly and PRN   Instructions Cath Flo 2mg : Administer for PICC Line occlusion and as ordered by physician for other access device   [facility] pharmacist to adjust dose for: Vancomycin, Aminoglycosides and other anti-infective therapies as requested by physician   Increase activity slowly   Complete  by: As directed    Method of administration may be changed at the discretion of home infusion pharmacist based upon assessment of the patient and/or caregiver's ability to self-administer the medication ordered    Complete by: As directed       Allergies as of 11/23/2022       Reactions   Lamictal [lamotrigine] Hives   Pravastatin Other (See Comments)   Muscle joint/pain.   Simvastatin Other (See Comments)   Muscle aches   Statins Other (See Comments)   Muscle/joint aches        Medication List     STOP taking these medications    meloxicam 15 MG tablet Commonly known as: MOBIC   rosuvastatin 5 MG tablet Commonly known as: CRESTOR   torsemide 10 MG tablet Commonly known as: DEMADEX       TAKE these medications    apixaban 5 MG Tabs tablet Commonly known as: ELIQUIS Take 5 mg by mouth 2 (two) times daily.   busPIRone 7.5 MG tablet Commonly known as: BUSPAR Take 1 tablet by mouth 2 (two) times daily.   carvedilol 12.5 MG tablet Commonly known as: COREG Take 12.5 mg by mouth 2 (two) times daily with a meal.   cefTRIAXone IVPB Commonly known as: ROCEPHIN Inject 2 g into the vein daily. Indication:  Septic arthritis  First Dose: Yes Last Day of Therapy:  12/22/22 Labs - Once weekly:  CBC/D, CK, CMP, CRP, and ESR  Method of administration: IV Push Fax weekly labs promptly to [phone] Please pull PICC at completion of IV antibiotics Method of administration may be changed at the discretion of home infusion pharmacist based upon assessment of the patient and/or caregiver's ability to self-administer the medication ordered.   cyanocobalamin 1000 MCG tablet Commonly known as: VITAMIN B12 Take 1,000 mcg by mouth in the morning.   daptomycin IVPB Commonly known as: CUBICIN Inject 700 mg into the vein daily for 7 days. Indication:  Septic arthritis First Dose: Yes Last Day of Therapy:  11/30/22 Labs - Once weekly:  CBC/D, CK, CMP, CRP, ESR Method of administration: IV Push Please fax the labs promptly to [phone] Please pull PICC at completion of IV antibiotics Method of administration may be changed at the discretion of home infusion pharmacist based  upon assessment of the patient and/or caregiver's ability to self-administer the medication ordered.   esomeprazole 20 MG capsule Commonly known as: NEXIUM Take 20 mg by mouth daily as needed (acid reflux/indigestion.).   finasteride 5 MG tablet Commonly known as: PROSCAR TAKE 1 TABLET BY MOUTH EVERY DAY   fluticasone 50 MCG/ACT nasal spray Commonly known as: FLONASE Place 1-2 sprays into both nostrils daily as needed for allergies.   Jardiance 10 MG Tabs tablet Generic drug: empagliflozin Take 10 mg by mouth daily.   levothyroxine 125 MCG tablet Commonly known as: SYNTHROID Take 137 mcg by mouth daily before breakfast.   oxyCODONE 5 MG immediate release tablet Commonly known as: Roxicodone Take 1-2 tablets (5-10 mg total) by mouth every 4 (four) hours as needed (pain).   sertraline 100 MG tablet Commonly known as: ZOLOFT Take 100 mg by mouth daily.   tadalafil 10 MG tablet Commonly known as: CIALIS Take 10 mg by mouth daily as needed for erectile dysfunction.   traZODone 100 MG tablet Commonly known as: DESYREL Take 100 mg by mouth at bedtime as needed for sleep.   valsartan 80 MG tablet Commonly known as: DIOVAN Take 80 mg by  mouth daily.   Vitamin D3 125 MCG (5000 UT) Tabs Take 5,000 Units by mouth in the morning.   Xyosted 100 MG/0.5ML Soaj Generic drug: Testosterone Enanthate Inject 0.5 mLs (100 mg total) into the skin once a week.   zinc sulfate 220 (50 Zn) MG capsule Take 220 mg by mouth in the morning.               Discharge Care Instructions  (From admission, onward)           Start     Ordered   11/23/22 0000  Change dressing on IV access line weekly and PRN  (Home infusion instructions - [facility] )        11/23/22 1057            Follow-up Information     Signa Kell, MD. Go on 12/05/2022.   Specialty: Orthopedic Surgery Why: Appt @ 10:15 Contact information: [address] [place]  [address] [phone]                Allergies  Allergen Reactions   Lamictal [Lamotrigine] Hives   Pravastatin Other (See Comments)    Muscle joint/pain.   Simvastatin Other (See Comments)    Muscle aches   Statins Other (See Comments)    Muscle/joint aches     Consultations: Orthopedics ID   Procedures/Studies: DG Chest Port 1 View  Result Date: 11/22/2022 CLINICAL DATA:  Status post PICC placement EXAM: PORTABLE CHEST 1 VIEW COMPARISON:  11/07/2018 FINDINGS: Stable cardiomediastinal silhouette. Increased pulmonary vascularity. Left basilar atelectasis or consolidation. Question small left pleural effusion. Right PICC tip at the superior cavoatrial junction. No pneumothorax. IMPRESSION: 1. Right PICC tip at the superior cavoatrial junction. 2. Left basilar atelectasis or consolidation. Electronically Signed   By: Minerva Fester M.D.   On: 11/22/2022 23:13   [place] EKG SITE RITE  Result Date: 11/22/2022 If Site Rite image not attached, placement could not be confirmed due to current cardiac rhythm.  [place] OR NERVE BLOCK-IMAGE ONLY [facility])  Result Date: 11/20/2022 There is no interpretation for this exam.  This order is for images obtained during a surgical procedure.  Please See "Surgeries" Tab for more information regarding the procedure.   MR SHOULDER LEFT WO CONTRAST  Result Date: 11/05/2022 CLINICAL DATA:  Left shoulder pain, recent rotator cuff repair 8 weeks ago. EXAM: MRI OF THE LEFT SHOULDER WITHOUT CONTRAST TECHNIQUE: Multiplanar, multisequence MR imaging of the shoulder was performed. No intravenous contrast was administered. COMPARISON:  07/02/2022 FINDINGS: Rotator cuff: Interval rotator cuff repair. Complete tear of the supraspinatus tendon 2 cm from the peripheral insertion and with 2.4 cm of retraction. Complete tear of the infraspinatus tendon. Teres minor tendon is intact. Moderate tendinosis of the subscapularis tendon. Muscles: Mild atrophy of the  infraspinatus muscle. Otherwise no significant muscle atrophy. Mild edema in the infraspinatus muscle consistent with mild muscle strain. No intramuscular fluid collection or hematoma. Biceps Long Head: Severe tendinosis of the intra-articular portion of the long head of the biceps tendon. Acromioclavicular Joint: Severe arthropathy of the acromioclavicular joint. 2.4 x 4.3 x 11.7 cm fluid collection in the subcutaneous fat just beneath the incision site extending to the [facility] joint into the skin surface likely reflecting a postoperative seroma or ganglion cyst. Small amount of subacromial/subdeltoid bursal fluid. Glenohumeral Joint: Small joint effusion with synovitis in the axillary recess. Partial-thickness cartilage loss of the glenohumeral joint. Labrum: Generalized labral degeneration. Bones: No fracture or dislocation. No aggressive  osseous lesion. Other: No fluid collection or hematoma. IMPRESSION: 1. Interval rotator cuff repair. Complete tear of the supraspinatus tendon 2 cm from the peripheral insertion and with 2.4 cm of retraction. Complete tear of the infraspinatus tendon. 2. Moderate tendinosis of the subscapularis tendon. 3. Severe arthropathy of the acromioclavicular joint. 2.4 x 4.3 x 11.7 cm fluid collection in the subcutaneous fat just beneath the incision site extending to the [facility] joint into the skin surface likely reflecting a postoperative seroma or ganglion cyst. 4. Severe tendinosis of the intra-articular portion of the long head of the biceps tendon. Electronically Signed   By: Elige Ko M.D.   On: 11/05/2022 17:00      Subjective: Seen and examined on the day of discharge.  Stable no distress.  Wife at bedside.  Stable for discharge home.  Discharge Exam: Vitals:   11/23/22 0741 11/23/22 1133  BP: (!) 172/86 (!) 184/97  Pulse: 67 (!) 47  Resp: 18 16  Temp: 97.6 F (36.4 C) 97.7 F (36.5 C)  SpO2: 97% 96%   Vitals:   11/22/22 1618 11/23/22 0018 11/23/22 0741 11/23/22 1133   BP: 122/73 (!) 150/83 (!) 172/86 (!) 184/97  Pulse: 92 (!) 57 67 (!) 47  Resp: 17 16 18 16   Temp: 98.3 F (36.8 C) 98.2 F (36.8 C) 97.6 F (36.4 C) 97.7 F (36.5 C)  TempSrc:   Oral   SpO2: 97% 96% 97% 96%  Weight:      Height:        General: Pt is alert, awake, not in acute distress Cardiovascular: RRR, S1/S2 +, no rubs, no gallops Respiratory: CTA bilaterally, no wheezing, no rhonchi Abdominal: Soft, NT, ND, bowel sounds + Extremities: no edema, no cyanosis    The results of significant diagnostics from this hospitalization (including imaging, microbiology, ancillary and laboratory) are listed below for reference.     Microbiology: Recent Results (from the past 240 hour(s))  Aerobic/Anaerobic Culture w Gram Stain (surgical/deep wound)     Status: None (Preliminary result)   Collection Time: 11/20/22 11:35 AM   Specimen: Path fluid; Body Fluid  Result Value Ref Range Status   Specimen Description   Final    FLUID Performed at [facility], [address] [place], [place] [address]    Special Requests LEFT SHOULDER ANTERIOR WOUND 1  Final   Gram Stain   Final    RARE WBC PRESENT,BOTH PMN AND MONONUCLEAR NO ORGANISMS SEEN    Culture   Final    NO GROWTH 3 DAYS NO ANAEROBES ISOLATED; CULTURE IN PROGRESS FOR 5 DAYS Performed at [facility], [address], [place], [place] [address]    Report Status PENDING  Incomplete  Aerobic/Anaerobic Culture w Gram Stain (surgical/deep wound)     Status: None (Preliminary result)   Collection Time: 11/20/22 11:43 AM   Specimen: Path fluid; Body Fluid  Result Value Ref Range Status   Specimen Description   Final    FLUID Performed at [facility], [address] [place], [place] [address]    Special Requests LEFT SHOULDER ANTERIO WOUND 2  Final   Gram Stain   Final    RARE WBC PRESENT,BOTH PMN AND MONONUCLEAR NO ORGANISMS SEEN Performed at [facility], [address], [place],  [place] [address]    Culture   Final    RARE STAPHYLOCOCCUS EPIDERMIDIS SUSCEPTIBILITIES TO FOLLOW NO ANAEROBES ISOLATED; CULTURE IN PROGRESS FOR 5 DAYS    Report  Status PENDING  Incomplete  Aerobic/Anaerobic Culture w Gram Stain (surgical/deep wound)     Status: None (Preliminary result)   Collection Time: 11/20/22 11:47 AM   Specimen: Path fluid; Body Fluid  Result Value Ref Range Status   Specimen Description   Final    FLUID Performed at [facility], [address] [place], [place] [address]    Special Requests LEFT SHOULDER GLENOHUMERAL JOINT  Final   Gram Stain   Final    FEW WBC PRESENT,BOTH PMN AND MONONUCLEAR RARE GRAM POSITIVE COCCI IN PAIRS    Culture   Final    NO GROWTH 3 DAYS NO ANAEROBES ISOLATED; CULTURE IN PROGRESS FOR 5 DAYS Performed at [facility], [address] [place], [place] [address]    Report Status PENDING  Incomplete  Aerobic/Anaerobic Culture w Gram Stain (surgical/deep wound)     Status: None (Preliminary result)   Collection Time: 11/20/22 11:48 AM   Specimen: Path Tissue  Result Value Ref Range Status   Specimen Description   Final    TISSUE Performed at [facility], [address], [place], [place] [address]    Special Requests LEFT SHOULDER GLENOHUMERAL JOINT.  Final   Gram Stain NO WBC SEEN NO ORGANISMS SEEN   Final   Culture   Final    NO GROWTH 3 DAYS NO ANAEROBES ISOLATED; CULTURE IN PROGRESS FOR 5 DAYS Performed at [facility], [address] [place], [place] [address]    Report Status PENDING  Incomplete     Labs: BNP (last 3 results) Recent Labs    05/08/22 1212 06/04/22 1604  BNP 601.1* 170.2*   Basic Metabolic Panel: Recent Labs  Lab 11/20/22 1457 11/21/22 0622 11/22/22 0516 11/23/22 0225  NA 136 135  --  135  K 3.7 3.6  --  3.4*  CL 102 103  --  103  CO2 25 22  --  25  GLUCOSE 126* 114*  --  105*  BUN 19 17  --  19  CREATININE 1.21 1.19 1.32* 1.15  CALCIUM 8.4* 8.5*  --  7.9*    Liver Function Tests: Recent Labs  Lab 11/20/22 1457  AST 23  ALT 22  ALKPHOS 59  BILITOT 0.7  PROT 7.3  ALBUMIN 4.0   No results for input(s): "LIPASE", "AMYLASE" in the last 168 hours. No results for input(s): "AMMONIA" in the last 168 hours. CBC: Recent Labs  Lab 11/20/22 1457 11/21/22 0622 11/23/22 0225  WBC 7.5 11.1* 8.3  NEUTROABS 6.2  --  5.1  HGB 16.2 15.7 14.7  HCT 50.9 48.8 45.0  MCV 84.0 84.3 84.6  PLT 160 173 165   Cardiac Enzymes: Recent Labs  Lab 11/23/22 0225  CKTOTAL 217   BNP: Invalid input(s): "POCBNP" CBG: Recent Labs  Lab 11/22/22 1229 11/22/22 1617 11/22/22 2111 11/23/22 0743 11/23/22 1135  GLUCAP 87 183* 160* 107* 117*   D-Dimer No results for input(s): "DDIMER" in the last 72 hours. Hgb A1c Recent Labs    11/21/22 0622  HGBA1C 6.1*   Lipid Profile No results for input(s): "CHOL", "HDL", "LDLCALC", "TRIG", "CHOLHDL", "LDLDIRECT" in the last 72 hours. Thyroid function studies No results for input(s): "TSH", "T4TOTAL", "T3FREE", "THYROIDAB" in the last 72 hours.  Invalid input(s): "FREET3" Anemia work up No results for input(s): "VITAMINB12", "FOLATE", "FERRITIN", "TIBC", "IRON", "RETICCTPCT" in the last 72 hours. Urinalysis    Component Value Date/Time   COLORURINE YELLOW (A) 11/07/2018 9629  APPEARANCEUR CLOUDY (A) 11/07/2018 0959   LABSPEC 1.015 11/07/2018 0959   PHURINE 5.0 11/07/2018 0959   GLUCOSEU >=500 (A) 11/07/2018 0959   HGBUR MODERATE (A) 11/07/2018 0959   BILIRUBINUR NEGATIVE 11/07/2018 0959   KETONESUR NEGATIVE 11/07/2018 0959   PROTEINUR 100 (A) 11/07/2018 0959   NITRITE NEGATIVE 11/07/2018 0959   LEUKOCYTESUR NEGATIVE 11/07/2018 0959   Sepsis Labs Recent Labs  Lab 11/20/22 1457 11/21/22 0622 11/23/22 0225  WBC 7.5 11.1* 8.3   Microbiology Recent Results (from the past 240 hour(s))  Aerobic/Anaerobic Culture w Gram Stain (surgical/deep wound)     Status: None (Preliminary result)   Collection  Time: 11/20/22 11:35 AM   Specimen: Path fluid; Body Fluid  Result Value Ref Range Status   Specimen Description   Final    FLUID Performed at [facility], [address] [place], [place] [address]    Special Requests LEFT SHOULDER ANTERIOR WOUND 1  Final   Gram Stain   Final    RARE WBC PRESENT,BOTH PMN AND MONONUCLEAR NO ORGANISMS SEEN    Culture   Final    NO GROWTH 3 DAYS NO ANAEROBES ISOLATED; CULTURE IN PROGRESS FOR 5 DAYS Performed at [facility], [address] [place], [place] [address]    Report Status PENDING  Incomplete  Aerobic/Anaerobic Culture w Gram Stain (surgical/deep wound)     Status: None (Preliminary result)   Collection Time: 11/20/22 11:43 AM   Specimen: Path fluid; Body Fluid  Result Value Ref Range Status   Specimen Description   Final    FLUID Performed at [facility], [address] [place], [place] [address]    Special Requests LEFT SHOULDER ANTERIO WOUND 2  Final   Gram Stain   Final    RARE WBC PRESENT,BOTH PMN AND MONONUCLEAR NO ORGANISMS SEEN Performed at [facility], [address], [place], [place] [address]    Culture   Final    RARE STAPHYLOCOCCUS EPIDERMIDIS SUSCEPTIBILITIES TO FOLLOW NO ANAEROBES ISOLATED; CULTURE IN PROGRESS FOR 5 DAYS    Report Status PENDING  Incomplete  Aerobic/Anaerobic Culture w Gram Stain (surgical/deep wound)     Status: None (Preliminary result)   Collection Time: 11/20/22 11:47 AM   Specimen: Path fluid; Body Fluid  Result Value Ref Range Status   Specimen Description   Final    FLUID Performed at [facility], [address] [place], [place] [address]    Special Requests LEFT SHOULDER GLENOHUMERAL JOINT  Final   Gram Stain   Final    FEW WBC PRESENT,BOTH PMN AND MONONUCLEAR RARE GRAM POSITIVE COCCI IN PAIRS    Culture   Final    NO GROWTH 3 DAYS NO ANAEROBES ISOLATED; CULTURE IN PROGRESS FOR 5 DAYS Performed at [facility], [address], [place], [place] [address]    Report Status PENDING  Incomplete  Aerobic/Anaerobic Culture w Gram Stain (surgical/deep wound)     Status: None (Preliminary result)   Collection Time: 11/20/22 11:48 AM   Specimen: Path Tissue  Result Value Ref Range Status   Specimen Description   Final    TISSUE Performed at [facility], [address], [place], [place] [address]    Special Requests LEFT SHOULDER GLENOHUMERAL JOINT.  Final   Gram Stain NO WBC SEEN NO ORGANISMS SEEN   Final   Culture   Final    NO GROWTH 3 DAYS NO ANAEROBES ISOLATED; CULTURE IN PROGRESS FOR  5 DAYS Performed at [facility], [address], [place], [place] [address]    Report Status PENDING  Incomplete     Time coordinating discharge: Over 30 minutes  SIGNED:   Tresa Moore, MD  [facility] 11/23/2022, 11:38 AM Pager   If 7PM-7AM, please contact night-coverage

## 2022-11-26 LAB — AEROBIC/ANAEROBIC CULTURE W GRAM STAIN (SURGICAL/DEEP WOUND): Gram Stain: NONE SEEN

## 2022-12-02 NOTE — Hospital Discharge Follow-Up (Signed)
Pts culture positive for staph epidermidis and susceptibility is R to oxacillin OPAT So he will continue Daptomycin until 12/22/22 along with ceftriaxone. Informed Pam chandler [facility] to continue Daptomycin and supply the IV antibiotics at home.

## 2022-12-03 ENCOUNTER — Telehealth: Payer: Self-pay

## 2022-12-03 NOTE — Telephone Encounter (Signed)
-----   Message from Lynn Ito sent at 12/02/2022  1:50 PM EST ----- Hi  Jamiesha Victoria Can you please call patient to confirm that he is gettitng both ceftriaxone and daptomycin. I had told pam on Minday 11/26/22 to continue dapto  for the entire period. So please check

## 2022-12-03 NOTE — Telephone Encounter (Signed)
I spoke with patient's wife and advised her that the patient will be continuing the daptomycin along with the ceftriaxone through 12/22/22. Patient's wife verbalized understanding. Mcarthur Ivins T Pricilla Loveless

## 2022-12-07 ENCOUNTER — Encounter: Payer: Self-pay | Admitting: Orthopedic Surgery

## 2022-12-25 ENCOUNTER — Other Ambulatory Visit
Admission: RE | Admit: 2022-12-25 | Discharge: 2022-12-25 | Disposition: A | Discharge status: Home / Self Care | Payer: Medicare Other | Source: Ambulatory Visit | Attending: Infectious Diseases | Admitting: Infectious Diseases

## 2022-12-25 ENCOUNTER — Ambulatory Visit: Payer: Medicare Other | Attending: Infectious Diseases | Admitting: Infectious Diseases

## 2022-12-25 ENCOUNTER — Telehealth: Payer: Self-pay

## 2022-12-25 ENCOUNTER — Encounter: Payer: Self-pay | Admitting: Infectious Diseases

## 2022-12-25 VITALS — BP 129/84 | HR 63 | Temp 97.3°F | Ht 72.0 in | Wt 245.0 lb

## 2022-12-25 DIAGNOSIS — B957 Other staphylococcus as the cause of diseases classified elsewhere: Secondary | ICD-10-CM | POA: Insufficient documentation

## 2022-12-25 DIAGNOSIS — I10 Essential (primary) hypertension: Secondary | ICD-10-CM | POA: Diagnosis not present

## 2022-12-25 DIAGNOSIS — E785 Hyperlipidemia, unspecified: Secondary | ICD-10-CM | POA: Diagnosis not present

## 2022-12-25 DIAGNOSIS — T8142XA Infection following a procedure, deep incisional surgical site, initial encounter: Secondary | ICD-10-CM | POA: Diagnosis present

## 2022-12-25 DIAGNOSIS — E039 Hypothyroidism, unspecified: Secondary | ICD-10-CM | POA: Diagnosis not present

## 2022-12-25 DIAGNOSIS — S46012S Strain of muscle(s) and tendon(s) of the rotator cuff of left shoulder, sequela: Secondary | ICD-10-CM | POA: Diagnosis not present

## 2022-12-25 DIAGNOSIS — B9689 Other specified bacterial agents as the cause of diseases classified elsewhere: Secondary | ICD-10-CM | POA: Insufficient documentation

## 2022-12-25 DIAGNOSIS — E119 Type 2 diabetes mellitus without complications: Secondary | ICD-10-CM | POA: Insufficient documentation

## 2022-12-25 DIAGNOSIS — M00012 Staphylococcal arthritis, left shoulder: Secondary | ICD-10-CM | POA: Diagnosis not present

## 2022-12-25 DIAGNOSIS — Z7901 Long term (current) use of anticoagulants: Secondary | ICD-10-CM | POA: Insufficient documentation

## 2022-12-25 DIAGNOSIS — X58XXXS Exposure to other specified factors, sequela: Secondary | ICD-10-CM | POA: Diagnosis not present

## 2022-12-25 DIAGNOSIS — G4730 Sleep apnea, unspecified: Secondary | ICD-10-CM | POA: Diagnosis not present

## 2022-12-25 DIAGNOSIS — M00812 Arthritis due to other bacteria, left shoulder: Secondary | ICD-10-CM | POA: Insufficient documentation

## 2022-12-25 DIAGNOSIS — Z7984 Long term (current) use of oral hypoglycemic drugs: Secondary | ICD-10-CM | POA: Insufficient documentation

## 2022-12-25 LAB — COMPREHENSIVE METABOLIC PANEL
ALT: 26 U/L (ref 0–44)
AST: 25 U/L (ref 15–41)
Albumin: 4 g/dL (ref 3.5–5.0)
Alkaline Phosphatase: 64 U/L (ref 38–126)
Anion gap: 10 (ref 5–15)
BUN: 26 mg/dL — ABNORMAL HIGH (ref 8–23)
CO2: 27 mmol/L (ref 22–32)
Calcium: 9.2 mg/dL (ref 8.9–10.3)
Chloride: 100 mmol/L (ref 98–111)
Creatinine, Ser: 1.47 mg/dL — ABNORMAL HIGH (ref 0.61–1.24)
GFR, Estimated: 52 mL/min — ABNORMAL LOW (ref >60)
Glucose, Bld: 122 mg/dL — ABNORMAL HIGH (ref 70–99)
Potassium: 3.7 mmol/L (ref 3.5–5.1)
Sodium: 137 mmol/L (ref 135–145)
Total Bilirubin: 0.8 mg/dL (ref <1.2)
Total Protein: 7.3 g/dL (ref 6.5–8.1)

## 2022-12-25 LAB — C-REACTIVE PROTEIN: CRP: 1.1 mg/dL — ABNORMAL HIGH (ref <1.0)

## 2022-12-25 LAB — CBC WITH DIFFERENTIAL/PLATELET
Abs Immature Granulocytes: 0.07 10*3/uL (ref 0.00–0.07)
Basophils Absolute: 0.1 10*3/uL (ref 0.0–0.1)
Basophils Relative: 2 %
Eosinophils Absolute: 0 10*3/uL (ref 0.0–0.5)
Eosinophils Relative: 1 %
HCT: 52.1 % — ABNORMAL HIGH (ref 39.0–52.0)
Hemoglobin: 16.8 g/dL (ref 13.0–17.0)
Immature Granulocytes: 1 %
Lymphocytes Relative: 24 %
Lymphs Abs: 1.6 10*3/uL (ref 0.7–4.0)
MCH: 26.1 pg (ref 26.0–34.0)
MCHC: 32.2 g/dL (ref 30.0–36.0)
MCV: 81 fL (ref 80.0–100.0)
Monocytes Absolute: 0.7 10*3/uL (ref 0.1–1.0)
Monocytes Relative: 10 %
Neutro Abs: 4.1 10*3/uL (ref 1.7–7.7)
Neutrophils Relative %: 62 %
Platelets: 231 10*3/uL (ref 150–400)
RBC: 6.43 MIL/uL — ABNORMAL HIGH (ref 4.22–5.81)
RDW: 15.4 % (ref 11.5–15.5)
WBC: 6.6 10*3/uL (ref 4.0–10.5)
nRBC: 0 % (ref 0.0–0.2)

## 2022-12-25 LAB — SEDIMENTATION RATE: Sed Rate: 3 mm/h (ref 0–20)

## 2022-12-25 MED ORDER — AMOXICILLIN 500 MG PO CAPS: 1000.0000 mg | ORAL_CAPSULE | Freq: Three times a day (TID) | ORAL | 0 refills | Status: DC

## 2022-12-25 MED ORDER — DOXYCYCLINE HYCLATE 100 MG PO TABS: 100.0000 mg | ORAL_TABLET | Freq: Two times a day (BID) | ORAL | 0 refills | Status: DC

## 2022-12-25 NOTE — Patient Instructions (Signed)
  You came in today for a follow-up visit after your hospital discharge for a shoulder infection. Your shoulder has healed well, but you have limited movement and a popping sensation. You are scheduled for a reverse shoulder arthroplasty, but the date is not yet set. You also mentioned that you accidentally removed your PICC line a bit early but had no complications from it.  YOUR PLAN:  -SHOULDER INFECTION: A shoulder infection is when harmful bacteria infect the tissues in your shoulder. You have completed a 4-week course of IV antibiotics, and your shoulder wound is healing well. However, your range of motion is limited due to the removal of infected tissue. You will start taking oral antibiotics (Amoxicillin three times a day and Doxycycline twice a day). You should stop these antibiotics two weeks before your planned surgery to allow for cultures. Please coordinate with Dr. Allena Katz to set a surgery date and preoperative culture. Blood tests will be done today to check your inflammatory markers and kidney function.  -GENERAL HEALTH MAINTENANCE: General health maintenance involves taking care of your overall health. You should resume all your regular medications, including Jardiance and Rosuvastatin. Your next follow-up appointment is in 2 months, but this may change based on your surgical plan with Dr. Allena Katz.  INSTRUCTIONS:  Please coordinate with Dr. Allena Katz to set a date for your reverse shoulder arthroplasty and preoperative culture. Blood tests will be done today to check your inflammatory markers and kidney function. Your next follow-up appointment is in 2 months, but this may change based on your surgical plan with Dr. Allena Katz.

## 2022-12-25 NOTE — Progress Notes (Signed)
NAME: Jacob Lawson  DOB: [birth year]  MRN: [id number]  Date/Time: 12/25/2022 9:34 AM  Follow up visit Subjective:   ?Discussed the use of AI scribe software for clinical note transcription with the patient, who gave verbal consent to proceed.    Jacob Lawson is a 67 y.o. male with a history of hypertension, hypothyroidism, sleep apnea, hyperlipidemia , h/o left foot abscess in 2020, DJD, rt knee medial menisceal tear repair, underwent extensive left rotator cuff repair and reconstruction on 09/07/22 which included biceps tenodesis,subacromial decompression.xtensive debridement of shoulder (glenohumeral and subacromial spaces) and distal clavicle excision He developed a small area of erythematous swelling  at the surgical site and was given bactrim on 10/23/22 and as there was worsening he was admitted and underwent I/D and extensive glenohumeral debridement and synovectomy and removal of any hardware on 11/20/22.  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 He reports that the shoulder has healed nicely, but movement is limited due to the removal of the infected tissue. He experiences a popping sensation in the shoulder. He is scheduled for a reverse shoulder arthroplasty, but the date is not yet determined. He has not seen Dr. Allena Katz ( but saw his PA)  since hospital discharge, but has an appointment next week. He removed his PICC line two days before the end of his four-week course of IV antibiotics, after accidentally pulling it out in his sleep.  We were not informed by the home nurse wjo was aware. He reports no bleeding or complications from this. He is planning to travel to [facility] for his son's final meeting on the [employer] of the [employer]. Past Medical History:  Diagnosis Date   Acquired hypothyroidism    Allergic rhinitis    Anginal pain (HCC)    Aortic atherosclerosis (HCC)     Carpal tunnel syndrome on both sides    Carpal tunnel syndrome on left    a.) s/p release   Chronic lower back pain    CKD (chronic kidney disease), stage III (HCC)    Colloid LEFT thyroid nodule    Depression    Diastolic dysfunction    a.) TTE 10/19/2020: EF >55%; mild LVH, mile BAE, mild RV enlargement; triv AR/TR/PR, mild MR; G1DD.   Difficult airway for intubation    DOE (dyspnea on exertion)    Generalized anxiety disorder    GERD (gastroesophageal reflux disease)    Hepatic steatosis    Hyperlipidemia    Hypertension    Hypogonadism in male    a.) s/p Testim implant in 2014. b.) uses exogenous TD testosterone   Hypothyroidism    Migraines    Mild mitral regurgitation by prior echocardiogram    Occipital neuralgia    OSA on CPAP    Osteoarthritis    Pulmonary nodules    Renal cyst, left    T2DM (type 2 diabetes mellitus) (HCC)    Vitiligo    Wears hearing aid in both ears     Past Surgical History:  Procedure Laterality Date   APPENDECTOMY     ARTHOSCOPIC ROTAOR CUFF REPAIR Left 11/20/2022   Procedure: Left shoulder open irrigation and debridement, arthroscopic irrigation and debridement and extensive debridment;  Surgeon: Signa Kell, MD;  Location: [facility];  Service: Orthopedics;  Laterality: Left;   CARPAL TUNNEL RELEASE Right 11/2003   CARPAL TUNNEL RELEASE Left 11/19/2017   Procedure: LEFT OPEN  CARPAL TUNNEL RELEASE;  Surgeon: Erin Sons, MD;  Location: [facility];  Service: Orthopedics;  Laterality: Left;  Diabetic - oral meds Sleep Apnea   CHONDROPLASTY Right 03/22/2020   Procedure: CHONDROPLASTY;  Surgeon: Christena Flake, MD;  Location: [facility];  Service: Orthopedics;  Laterality: Right;  ABRASION CHONDROPLASTY   COLONOSCOPY     2/91, 6/00, 01/04, 7/08, 10/10, 12/13, 02/26/17   COLONOSCOPY WITH PROPOFOL N/A 03/07/2020   Procedure: COLONOSCOPY WITH PROPOFOL;  Surgeon: Regis Bill, MD;  Location: [facility];  Service: Endoscopy;   Laterality: N/A;   ESOPHAGOGASTRODUODENOSCOPY     2/91, 1/95, 6/00, 1/04, 7/08, 10/10   ESOPHAGOGASTRODUODENOSCOPY  03/07/2020   Procedure: ESOPHAGOGASTRODUODENOSCOPY (EGD);  Surgeon: Regis Bill, MD;  Location: [facility] END[facility];  Service: Endoscopy;;   EXCISION NEUROMA Left 06/2007   foot  had this done twice   FINGER SURGERY Bilateral    for congenital webbing   GANGLION CYST EXCISION Right 09/23/2019   Procedure: REMOVAL GANGLION OF FLEXOR SHEATH RIGHT LONG FINGER;  Surgeon: Christena Flake, MD;  Location: [facility];  Service: Orthopedics;  Laterality: Right;   IRRIGATION AND DEBRIDEMENT FOOT Left 11/09/2018   Procedure: IRRIGATION AND DEBRIDEMENT FOOT;  Surgeon: Linus Galas, DPM;  Location: [facility];  Service: Podiatry;  Laterality: Left;   KNEE ARTHROSCOPY Right 03/22/2020   Procedure: RIGHT KNEE ARTHROSCOPY WITH PARTIAL MENISCECTOMY;  Surgeon: Christena Flake, MD;  Location: [facility];  Service: Orthopedics;  Laterality: Right;   KNEE ARTHROSCOPY Right 05/25/2021   Procedure: RIGHT KNEE ARTHROSCOPY WITH DEBRIDEMENT AND PARTIAL MEDIAL MENISCECTOMY;  Surgeon: Christena Flake, MD;  Location: [facility];  Service: Orthopedics;  Laterality: Right;   KNEE ARTHROSCOPY WITH MENISCAL REPAIR Right 03/22/2020   Procedure: KNEE ARTHROSCOPY WITH MENISCAL REPAIR;  Surgeon: Christena Flake, MD;  Location: [facility];  Service: Orthopedics;  Laterality: Right;   LASIK Bilateral    MANDIBLE SURGERY     in late 30s, fo overbite   PROSTATE BIOPSY  12/2015   ROTATOR CUFF REPAIR Left 02/2005   SHOULDER ARTHROSCOPY WITH DEBRIDEMENT AND BICEP TENDON REPAIR Left 11/20/2022   Procedure: Left shoulder open irrigation and debridement, arthroscopic irrigation and debridement and extensive debridment;  Surgeon: Signa Kell, MD;  Location: [facility];  Service: Orthopedics;  Laterality: Left;   SHOULDER ARTHROSCOPY WITH SUBACROMIAL DECOMPRESSION AND OPEN ROTATOR C Right 07/16/2019   Procedure: Right shoulder arthroscopy with  debridement, decompression, possible rotator cuff repair, and probable biceps tenodesis;  Surgeon: Christena Flake, MD;  Location: [facility];  Service: Orthopedics;  Laterality: Right;   THYROIDECTOMY, PARTIAL     TONSILLECTOMY     and adenoidectomy    Social History   Socioeconomic History   Marital status: Married    Spouse name: Not on file   Number of children: Not on file   Years of education: Not on file   Highest education level: Not on file  Occupational History   Not on file  Tobacco Use   Smoking status: Never   Smokeless tobacco: Never  Vaping Use   Vaping status: Never Used  Substance and Sexual Activity   Alcohol use: Yes    Alcohol/week: 1.0 - 2.0 standard drink of alcohol    Types: 1 - 2 Cans of beer per week    Comment: occasional beer   Drug use: No   Sexual activity: Yes    Partners: Female    Birth control/protection: None  Other Topics Concern   Not  on file  Social History Narrative   Not on file   Social Determinants of Health   Financial Resource Strain: Low Risk  (08/30/2022)   Received from [facility]   Overall Financial Resource Strain (CARDIA)    Difficulty of Paying Living Expenses: Not hard at all  Food Insecurity: No Food Insecurity (11/20/2022)   Hunger Vital Sign    Worried About Running Out of Food in the Last Year: Never true    Ran Out of Food in the Last Year: Never true  Transportation Needs: No Transportation Needs (11/20/2022)   PRAPARE - Administrator, Civil Service (Medical): No    Lack of Transportation (Non-Medical): No  Physical Activity: Not on file  Stress: Not on file  Social Connections: Not on file  Intimate Partner Violence: Not At Risk (11/20/2022)   Humiliation, Afraid, Rape, and Kick questionnaire    Fear of Current or Ex-Partner: No    Emotionally Abused: No    Physically Abused: No    Sexually Abused: No    Family History  Problem Relation Age of Onset   Dementia Mother     Pancreatic cancer Father    Prostate cancer Brother    Allergies  Allergen Reactions   Lamictal [Lamotrigine] Hives   Pravastatin Other (See Comments)    Muscle joint/pain.   Simvastatin Other (See Comments)    Muscle aches   Statins Other (See Comments)    Muscle/joint aches    I? Current Outpatient Medications  Medication Sig Dispense Refill   apixaban (ELIQUIS) 5 MG TABS tablet Take 5 mg by mouth 2 (two) times daily.     busPIRone (BUSPAR) 7.5 MG tablet Take 1 tablet by mouth 2 (two) times daily.     carvedilol (COREG) 12.5 MG tablet Take 12.5 mg by mouth 2 (two) times daily with a meal.     Cholecalciferol (VITAMIN D3) 125 MCG (5000 UT) TABS Take 5,000 Units by mouth in the morning.     empagliflozin (JARDIANCE) 10 MG TABS tablet Take 10 mg by mouth daily.     esomeprazole (NEXIUM) 20 MG capsule Take 20 mg by mouth daily as needed (acid reflux/indigestion.).     finasteride (PROSCAR) 5 MG tablet TAKE 1 TABLET BY MOUTH EVERY DAY 90 tablet 3   fluticasone (FLONASE) 50 MCG/ACT nasal spray Place 1-2 sprays into both nostrils daily as needed for allergies.      levothyroxine (SYNTHROID, LEVOTHROID) 125 MCG tablet Take 137 mcg by mouth daily before breakfast.     oxyCODONE (ROXICODONE) 5 MG immediate release tablet Take 1-2 tablets (5-10 mg total) by mouth every 4 (four) hours as needed (pain). 30 tablet 0   sertraline (ZOLOFT) 100 MG tablet Take 100 mg by mouth daily.     tadalafil (CIALIS) 10 MG tablet Take 10 mg by mouth daily as needed for erectile dysfunction.     Testosterone Enanthate (XYOSTED) 100 MG/0.5ML SOAJ Inject 0.5 mLs (100 mg total) into the skin once a week. 1.96 mL 3   traZODone (DESYREL) 100 MG tablet Take 100 mg by mouth at bedtime as needed for sleep.     valsartan (DIOVAN) 80 MG tablet Take 80 mg by mouth daily.     vitamin B-12 (CYANOCOBALAMIN) 1000 MCG tablet Take 1,000 mcg by mouth in the morning.     zinc sulfate 220 (50 Zn) MG capsule Take 220 mg by mouth in  the morning.     No current facility-administered medications  for this visit.     Abtx:  Anti-infectives (From admission, onward)    None       REVIEW OF SYSTEMS:  Const: negative fever, negative chills, negative weight loss Eyes: negative diplopia or visual changes, negative eye pain ENT: negative coryza, negative sore throat Resp: negative cough, hemoptysis, dyspnea Cards: negative for chest pain, palpitations, lower extremity edema GU: negative for frequency, dysuria and hematuria GI: Negative for abdominal pain, diarrhea, bleeding, constipation Skin: negative for rash and pruritus Heme: negative for easy bruising and gum/nose bleeding ZO:XWRUEAV movt left shoulder Neurolo:negative for headaches, dizziness, vertigo, memory problems  Psych: negative for feelings of anxiety, depression  Endocrine: negative for thyroid, diabetes Allergy/Immunology- as above Objective:  VITALS:  BP 129/84   Pulse 63   Temp (!) 97.3 F (36.3 C) (Temporal)   Ht 6' (1.829 m)   Wt 245 lb (111.1 kg)   BMI 33.23 kg/m   PHYSICAL EXAM:  General: Alert, cooperative, no distress, appears stated age.  Head: Normocephalic, without obvious abnormality, atraumatic. Eyes: Conjunctivae clear, anicteric sclerae. Pupils are equal ENT Nares normal. No drainage or sinus tenderness. Lips, mucosa, and tongue normal. No Thrush Neck: Supple, symmetrical, no adenopathy, thyroid: non tender no carotid bruit and no JVD. Back: No CVA tenderness. Lungs: Clear to auscultation bilaterally. No Wheezing or Rhonchi. No rales. Heart: Regular rate and rhythm, no murmur, rub or gallop. Abdomen: Soft, non-tender,not distended. Bowel sounds normal. No masses Extremities: left shoulder- surical site healed very well  No erythema or discharge Rt picc site- no abscess or lesions  Skin: No rashes or lesions. Or bruising Lymph: Cervical, supraclavicular normal. Neurologic: Grossly non-focal Pertinent Labs  new labs to  review  Radiology- no new imaging to review?   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 Shoulder wound healed . Limited range of motion due to removal of infected hardware. Plan for reverse shoulder arthroplasty pending infection clearance. -Start oral antibiotics:Amoxicillin TID and Doxycycline 100mg  BID X 4 weeks ( amoxicillin will be 1 gram TID for 2 weeks followed by 500mg  TID for 2 weeks. -Stop antibiotics 2 weeks prior to planned surgery for culture. -Coordinate with Dr. Allena Katz regarding surgery date and preoperative culture. -Order blood tests today to check inflammatory markers and kidney function. ? ?_______ Discussed with patient,in detail Note:  This document was prepared using Dragon voice recognition software and may include unintentional dictation errors.

## 2022-12-25 NOTE — Telephone Encounter (Signed)
Patient in office today reported his picc line came out at home on 12/19/22. I advised the patient that we were not aware.  He stated he reported to his [facility] nurse.  [employer] informed of patient picc line coming out in the home.  [name] [name], CMA

## 2023-01-01 ENCOUNTER — Other Ambulatory Visit: Payer: Self-pay | Admitting: Orthopedic Surgery

## 2023-01-01 DIAGNOSIS — T8140XD Infection following a procedure, unspecified, subsequent encounter: Secondary | ICD-10-CM

## 2023-01-02 ENCOUNTER — Inpatient Hospital Stay
Admission: RE | Admit: 2023-01-02 | Discharge: 2023-01-02 | Disposition: A | Discharge status: Home / Self Care | Payer: Medicare Other | Source: Ambulatory Visit | Attending: Orthopedic Surgery

## 2023-01-02 DIAGNOSIS — T8140XD Infection following a procedure, unspecified, subsequent encounter: Secondary | ICD-10-CM

## 2023-01-17 ENCOUNTER — Other Ambulatory Visit: Payer: Self-pay | Admitting: *Deleted

## 2023-01-17 ENCOUNTER — Encounter: Payer: Self-pay | Admitting: Urology

## 2023-01-17 MED ORDER — XYOSTED 100 MG/0.5ML ~~LOC~~ SOAJ: 0.5000 mL | SUBCUTANEOUS | 3 refills | Status: DC

## 2023-01-20 ENCOUNTER — Other Ambulatory Visit: Payer: Self-pay | Admitting: Infectious Diseases

## 2023-01-21 ENCOUNTER — Telehealth: Payer: Self-pay

## 2023-01-21 NOTE — Telephone Encounter (Signed)
Per Dr. Rivka Safer patient does not need to continue antibiotics at this time.  Patient's wife informed and verbalized understanding. Faizon Capozzi [name], CMA

## 2023-01-25 ENCOUNTER — Other Ambulatory Visit: Payer: Self-pay | Admitting: *Deleted

## 2023-01-25 NOTE — Telephone Encounter (Signed)
 Patient's wife lvm this morning regarding refill for testosterone prescription. He is out of medication. She said the pharmarcy has also sent a request to office. Please advise patient.

## 2023-01-25 NOTE — Telephone Encounter (Signed)
 Medication was sent to [facility]. I just sent DR. Stoioff a messge to send medication to [facility].

## 2023-01-27 MED ORDER — XYOSTED 100 MG/0.5ML ~~LOC~~ SOAJ: 0.5000 mL | SUBCUTANEOUS | 3 refills | Status: DC

## 2023-02-07 ENCOUNTER — Other Ambulatory Visit: Payer: Self-pay | Admitting: Internal Medicine

## 2023-02-07 DIAGNOSIS — I4891 Unspecified atrial fibrillation: Secondary | ICD-10-CM

## 2023-02-17 MED ORDER — SODIUM CHLORIDE 0.9 % IV SOLN: INTRAVENOUS | Status: DC

## 2023-02-18 ENCOUNTER — Encounter: Payer: Self-pay | Admitting: Cardiovascular Disease

## 2023-02-18 ENCOUNTER — Encounter
Admission: RE | Disposition: A | Discharge status: Home / Self Care | Payer: Self-pay | Source: Ambulatory Visit | Attending: Cardiovascular Disease

## 2023-02-18 ENCOUNTER — Ambulatory Visit: Payer: Medicare Other

## 2023-02-18 ENCOUNTER — Ambulatory Visit
Admission: RE | Admit: 2023-02-18 | Discharge: 2023-02-18 | Disposition: A | Discharge status: Home / Self Care | Payer: Medicare Other | Source: Ambulatory Visit | Attending: Cardiovascular Disease | Admitting: Cardiovascular Disease

## 2023-02-18 ENCOUNTER — Ambulatory Visit
Admission: RE | Admit: 2023-02-18 | Discharge: 2023-02-18 | Disposition: A | Discharge status: Home / Self Care | Payer: Medicare Other | Source: Ambulatory Visit | Attending: Student | Admitting: Cardiovascular Disease

## 2023-02-18 ENCOUNTER — Other Ambulatory Visit: Payer: Self-pay

## 2023-02-18 DIAGNOSIS — G4733 Obstructive sleep apnea (adult) (pediatric): Secondary | ICD-10-CM | POA: Diagnosis not present

## 2023-02-18 DIAGNOSIS — I4891 Unspecified atrial fibrillation: Secondary | ICD-10-CM | POA: Diagnosis present

## 2023-02-18 HISTORY — PX: TEE WITHOUT CARDIOVERSION: SHX5443

## 2023-02-18 HISTORY — PX: CARDIOVERSION: SHX1299

## 2023-02-18 LAB — GLUCOSE, CAPILLARY: Glucose-Capillary: 85 mg/dL (ref 70–99)

## 2023-02-18 SURGERY — TRANSESOPHAGEAL ECHOCARDIOGRAM (TEE)
Anesthesia: General

## 2023-02-18 MED ORDER — LIDOCAINE HCL (PF) 2 % IJ SOLN
INTRAMUSCULAR | Status: DC | PRN
  Administered 2023-02-18: 100 mg via INTRADERMAL

## 2023-02-18 MED ORDER — APIXABAN 5 MG PO TABS
5.0000 mg | ORAL_TABLET | Freq: Once | ORAL | Status: AC
  Administered 2023-02-18: 5 mg via ORAL
  Filled 2023-02-18: qty 1

## 2023-02-18 MED ORDER — PROPOFOL 500 MG/50ML IV EMUL
INTRAVENOUS | Status: DC | PRN
  Administered 2023-02-18 (×2): 100 mg via INTRAVENOUS

## 2023-02-18 MED ORDER — BUTAMBEN-TETRACAINE-BENZOCAINE 2-2-14 % EX AERO
INHALATION_SPRAY | CUTANEOUS | Status: AC
  Filled 2023-02-18: qty 5

## 2023-02-18 MED ORDER — LIDOCAINE VISCOUS HCL 2 % MT SOLN
OROMUCOSAL | Status: AC
  Filled 2023-02-18: qty 15

## 2023-02-18 NOTE — Transfer of Care (Signed)
Immediate Anesthesia Transfer of Care Note  Patient: ROMONE SHAFF  Procedure(s) Performed: TRANSESOPHAGEAL ECHOCARDIOGRAM (TEE) CARDIOVERSION  Patient Location: Cath Lab  Anesthesia Type:General  Level of Consciousness: drowsy  Airway & Oxygen Therapy: Patient Spontanous Breathing and Patient connected to nasal cannula oxygen  Post-op Assessment: Report given to RN and Post -op Vital signs reviewed and stable  Post vital signs: Reviewed and stable  Last Vitals:  Vitals Value Taken Time  BP    Temp    Pulse    Resp    SpO2      Last Pain:  Vitals:   02/18/23 1241  TempSrc: Oral  PainSc: 0-No pain         Complications:  Encounter Notable Events  Notable Event Outcome Phase Comment  Airway obstruction requiring support  Intraprocedure   Desaturation < 90% for over 3 min or < 80% for over 1 min  Intraprocedure

## 2023-02-18 NOTE — H&P (Signed)
Pre-procedure History & Physical    Patient ID: Jacob Lawson MRN: [id number]; DOB: 17-Nov-1955   Date of procedure: 02/18/2023  Primary Care Provider: Mick Sell, MD Primary Cardiologist: Juliann Pares   Planned procedure:  TEE DCCV  HPI:   Jacob Lawson is a 68 y.o. male with pAF here for TEE DCCV  Past Medical History:  Diagnosis Date   Acquired hypothyroidism    Allergic rhinitis    Anginal pain (HCC)    Aortic atherosclerosis (HCC)    Carpal tunnel syndrome on both sides    Carpal tunnel syndrome on left    a.) s/p release   Chronic lower back pain    CKD (chronic kidney disease), stage III (HCC)    Colloid LEFT thyroid nodule    Depression    Diastolic dysfunction    a.) TTE 10/19/2020: EF >55%; mild LVH, mile BAE, mild RV enlargement; triv AR/TR/PR, mild MR; G1DD.   Difficult airway for intubation    DOE (dyspnea on exertion)    Generalized anxiety disorder    GERD (gastroesophageal reflux disease)    Hepatic steatosis    Hyperlipidemia    Hypertension    Hypogonadism in male    a.) s/p Testim implant in 2014. b.) uses exogenous TD testosterone   Hypothyroidism    Migraines    Mild mitral regurgitation by prior echocardiogram    Occipital neuralgia    OSA on CPAP    Osteoarthritis    Pulmonary nodules    Renal cyst, left    T2DM (type 2 diabetes mellitus) (HCC)    Vitiligo    Wears hearing aid in both ears     Past Surgical History:  Procedure Laterality Date   APPENDECTOMY     ARTHOSCOPIC ROTAOR CUFF REPAIR Left 11/20/2022   Procedure: Left shoulder open irrigation and debridement, arthroscopic irrigation and debridement and extensive debridment;  Surgeon: Signa Kell, MD;  Location: [facility];  Service: Orthopedics;  Laterality: Left;   CARPAL TUNNEL RELEASE Right 11/2003   CARPAL TUNNEL RELEASE Left 11/19/2017   Procedure: LEFT OPEN CARPAL TUNNEL RELEASE;  Surgeon: Erin Sons, MD;  Location: [facility];  Service: Orthopedics;   Laterality: Left;  Diabetic - oral meds Sleep Apnea   CHONDROPLASTY Right 03/22/2020   Procedure: CHONDROPLASTY;  Surgeon: Christena Flake, MD;  Location: [facility];  Service: Orthopedics;  Laterality: Right;  ABRASION CHONDROPLASTY   COLONOSCOPY     2/91, 6/00, 01/04, 7/08, 10/10, 12/13, 02/26/17   COLONOSCOPY WITH PROPOFOL N/A 03/07/2020   Procedure: COLONOSCOPY WITH PROPOFOL;  Surgeon: Regis Bill, MD;  Location: [facility];  Service: Endoscopy;  Laterality: N/A;   ESOPHAGOGASTRODUODENOSCOPY     2/91, 1/95, 6/00, 1/04, 7/08, 10/10   ESOPHAGOGASTRODUODENOSCOPY  03/07/2020   Procedure: ESOPHAGOGASTRODUODENOSCOPY (EGD);  Surgeon: Regis Bill, MD;  Location: [facility];  Service: Endoscopy;;   EXCISION NEUROMA Left 06/2007   foot  had this done twice   FINGER SURGERY Bilateral    for congenital webbing   GANGLION CYST EXCISION Right 09/23/2019   Procedure: REMOVAL GANGLION OF FLEXOR SHEATH RIGHT LONG FINGER;  Surgeon: Christena Flake, MD;  Location: [facility];  Service: Orthopedics;  Laterality: Right;   IRRIGATION AND DEBRIDEMENT FOOT Left 11/09/2018   Procedure: IRRIGATION AND DEBRIDEMENT FOOT;  Surgeon: Linus Galas, DPM;  Location: [facility];  Service: Podiatry;  Laterality: Left;   KNEE ARTHROSCOPY Right 03/22/2020   Procedure: RIGHT KNEE ARTHROSCOPY WITH PARTIAL MENISCECTOMY;  Surgeon: Leron Croak  J, MD;  Location: [facility];  Service: Orthopedics;  Laterality: Right;   KNEE ARTHROSCOPY Right 05/25/2021   Procedure: RIGHT KNEE ARTHROSCOPY WITH DEBRIDEMENT AND PARTIAL MEDIAL MENISCECTOMY;  Surgeon: Christena Flake, MD;  Location: [facility];  Service: Orthopedics;  Laterality: Right;   KNEE ARTHROSCOPY WITH MENISCAL REPAIR Right 03/22/2020   Procedure: KNEE ARTHROSCOPY WITH MENISCAL REPAIR;  Surgeon: Christena Flake, MD;  Location: [facility];  Service: Orthopedics;  Laterality: Right;   LASIK Bilateral    MANDIBLE SURGERY     in late 30s, fo overbite   PROSTATE BIOPSY  12/2015   ROTATOR  CUFF REPAIR Left 02/2005   SHOULDER ARTHROSCOPY WITH DEBRIDEMENT AND BICEP TENDON REPAIR Left 11/20/2022   Procedure: Left shoulder open irrigation and debridement, arthroscopic irrigation and debridement and extensive debridment;  Surgeon: Signa Kell, MD;  Location: [facility];  Service: Orthopedics;  Laterality: Left;   SHOULDER ARTHROSCOPY WITH SUBACROMIAL DECOMPRESSION AND OPEN ROTATOR C Right 07/16/2019   Procedure: Right shoulder arthroscopy with debridement, decompression, possible rotator cuff repair, and probable biceps tenodesis;  Surgeon: Christena Flake, MD;  Location: [facility];  Service: Orthopedics;  Laterality: Right;   THYROIDECTOMY, PARTIAL     TONSILLECTOMY     and adenoidectomy     Medications Prior to Admission: Prior to Admission medications   Medication Sig Start Date End Date Taking? Authorizing Provider  amiodarone (PACERONE) 200 MG tablet Take 200 mg by mouth daily at 12 noon.   Yes [provider]  apixaban (ELIQUIS) 5 MG TABS tablet Take 5 mg by mouth 2 (two) times daily.   Yes [provider]  busPIRone (BUSPAR) 7.5 MG tablet Take 7.5 mg by mouth 2 (two) times daily. 05/31/22  Yes [provider]  carvedilol (COREG) 6.25 MG tablet Take 6.25 mg by mouth 2 (two) times daily with a meal.   Yes [provider]  Cholecalciferol (VITAMIN D3) 125 MCG (5000 UT) TABS Take 5,000 Units by mouth in the morning.   Yes [provider]  Cyanocobalamin (VITAMIN B-12 PO) Take 250 mcg by mouth in the morning.   Yes [provider]  empagliflozin (JARDIANCE) 10 MG TABS tablet Take 10 mg by mouth in the morning.   Yes [provider]  esomeprazole (NEXIUM) 20 MG capsule Take 20 mg by mouth daily as needed (acid reflux/indigestion.).   Yes [provider]  finasteride (PROSCAR) 5 MG tablet TAKE 1 TABLET BY MOUTH EVERY DAY Patient taking differently: Take 5 mg by mouth every evening. 07/19/22  Yes Vanna Scotland, MD   fluticasone [facility]) 50 MCG/ACT nasal spray Place 1-2 sprays into both nostrils daily as needed for allergies.    Yes [provider]  levothyroxine (SYNTHROID) 150 MCG tablet Take 150 mcg by mouth daily before breakfast.   Yes [provider]  naproxen sodium (ALEVE) 220 MG tablet Take 440 mg by mouth 2 (two) times daily as needed (pain.).   Yes [provider]  rosuvastatin (CRESTOR) 5 MG tablet Take 5 mg by mouth every Monday, Wednesday, and Friday. At night   Yes [provider]  sertraline (ZOLOFT) 100 MG tablet Take 100 mg by mouth in the morning.   Yes [provider]  tadalafil (CIALIS) 20 MG tablet Take 40-100 mg by mouth daily as needed for erectile dysfunction.   Yes [provider]  Testosterone Enanthate (XYOSTED) 100 MG/0.5ML SOAJ Inject 100 mg into the skin every Wednesday.   Yes [provider]  valsartan (DIOVAN) 80 MG tablet Take 80 mg by mouth in the morning.   Yes [provider]  zinc sulfate 220 (50 Zn) MG capsule Take 220 mg by mouth in the morning.   Yes [provider]     Allergies:    Allergies  Allergen Reactions   Lamictal [Lamotrigine] Hives   Pravastatin Other (See Comments)    Muscle joint/pain.   Simvastatin Other (See Comments)    Muscle aches   Statins Other (See Comments)    Muscle/joint aches     Social History:   Social History   Socioeconomic History   Marital status: Married    Spouse name: Not on file   Number of children: Not on file   Years of education: Not on file   Highest education level: Not on file  Occupational History   Not on file  Tobacco Use   Smoking status: Never   Smokeless tobacco: Never  Vaping Use   Vaping status: Never Used  Substance and Sexual Activity   Alcohol use: Yes    Alcohol/week: 1.0 - 2.0 standard drink of alcohol    Types: 1 - 2 Cans of beer per week    Comment: occasional beer   Drug use: No   Sexual activity: Yes     Partners: Female    Birth control/protection: None  Other Topics Concern   Not on file  Social History Narrative   Not on file   Social Drivers of Health   Financial Resource Strain: Low Risk  (08/30/2022)   Received from [facility]   Overall Financial Resource Strain (CARDIA)    Difficulty of Paying Living Expenses: Not hard at all  Food Insecurity: No Food Insecurity (11/20/2022)   Hunger Vital Sign    Worried About Running Out of Food in the Last Year: Never true    Ran Out of Food in the Last Year: Never true  Transportation Needs: No Transportation Needs (11/20/2022)   PRAPARE - Administrator, Civil Service (Medical): No    Lack of Transportation (Non-Medical): No  Physical Activity: Not on file  Stress: Not on file  Social Connections: Not on file  Intimate Partner Violence: Not At Risk (11/20/2022)   Humiliation, Afraid, Rape, and Kick questionnaire    Fear of Current or Ex-Partner: No    Emotionally Abused: No    Physically Abused: No    Sexually Abused: No     Family History:   The patient's family history includes Dementia in his mother; Pancreatic cancer in his father; Prostate cancer in his brother.    ROS:  Please see the history of present illness.  All other ROS reviewed and negative.     Physical Exam/Data:  There were no vitals filed for this visit. No intake or output data in the 24 hours ending 02/18/23 1157    12/25/2022    9:12 AM 11/20/2022    9:50 AM 09/28/2022    9:13 AM  Last 3 Weights  Weight (lbs) 245 lb 242 lb 249 lb  Weight (kg) 111.131 kg 109.77 kg 112.946 kg     There is no height or weight on file to calculate BMI.  Wt Readings from Last 3 Encounters:  12/25/22 111.1 kg  11/20/22 109.8 kg  09/28/22 112.9 kg    Physical Exam: General: no acute distress. Head: Normocephalic, atraumatic  Neck: supple Lungs: nl effort Heart: nl rate, irreg irreg, nl s1 s2 Abdomen: Soft Msk:  Strength and tone  appear normal for age. Extremities: warm, dry Neuro: awake and alert Psych:  Responds to questions appropriately with a normal affect.    Laboratory Data:  ChemistryNo results for input(s): "NA", "K", "CL", "CO2", "GLUCOSE", "BUN", "CREATININE", "CALCIUM", "GFRNONAA", "GFRAA", "ANIONGAP" in the last 168 hours.  No results for input(s): "PROT", "ALBUMIN", "AST", "ALT", "ALKPHOS", "BILITOT" in the last 168 hours. HematologyNo results for input(s): "WBC", "RBC", "HGB", "HCT", "MCV", "MCH", "MCHC", "RDW", "PLT" in the last 168 hours.  Assessment and Plan   Will proceed w/ TEE DCCV  ASA III  Anesthesia to be administered by CRNA  Signed, Tiajuana Amass, MD 02/18/2023, 11:57 AM

## 2023-02-18 NOTE — Anesthesia Postprocedure Evaluation (Signed)
Anesthesia Post Note  Patient: SUEDE GREENAWALT  Procedure(s) Performed: TRANSESOPHAGEAL ECHOCARDIOGRAM (TEE) CARDIOVERSION  Patient location during evaluation: PACU Anesthesia Type: General Level of consciousness: awake and alert Pain management: pain level controlled Vital Signs Assessment: post-procedure vital signs reviewed and stable Respiratory status: spontaneous breathing, nonlabored ventilation, respiratory function stable and patient connected to nasal cannula oxygen Cardiovascular status: blood pressure returned to baseline and stable Postop Assessment: no apparent nausea or vomiting Anesthetic complications: yes  Encounter Notable Events  Notable Event Outcome Phase Comment  Airway obstruction requiring support  Intraprocedure   Desaturation < 90% for over 3 min or < 80% for over 1 min  Intraprocedure      Last Vitals:  Vitals:   02/18/23 1400 02/18/23 1438  BP: (!) 150/99 (!) 164/108  Pulse: 64   Resp: 14 16  Temp:    SpO2: 96% 93%    Last Pain:  Vitals:   02/18/23 1438  TempSrc:   PainSc: 0-No pain                 Stephanie Coup

## 2023-02-18 NOTE — Anesthesia Preprocedure Evaluation (Signed)
Anesthesia Evaluation  Patient identified by MRN, date of birth, ID band Patient awake  General Assessment Comment:  Prior Grade 3 view with mcgrath.  Reviewed: Allergy & Precautions, H&P , NPO status , Patient's Chart, lab work & pertinent test results  History of Anesthesia Complications Negative for: history of anesthetic complications  Airway Mallampati: IV  TM Distance: >3 FB Neck ROM: Full  Mouth opening: Limited Mouth Opening Comment: Thick neck, limited oral opening, would expect likely difficult airway if intubation needed, would consider videolaryngoscope if intubation needed. Dental  (+) Caps   Pulmonary asthma , sleep apnea and Continuous Positive Airway Pressure Ventilation , neg COPD   Pulmonary exam normal breath sounds clear to auscultation       Cardiovascular hypertension, + angina  + DOE  (-) Past MI and (-) Cardiac Stents + dysrhythmias Atrial Fibrillation  Rhythm:Regular Rate:Normal  Cardiac MRI: (06/13/2022) IMPRESSION:  1. Normal LV size and function. LVEF 56%.   2. No LV LGE or scar.   3. No evidence for infiltrative disease.   4. Normal RV size and function.   5. No significant valvular abnormalities.   6. No evidence for amyloidosis.   Echocardiogram 2D complete: (05/29/2022) INTERPRETATION NORMAL LEFT VENTRICULAR SYSTOLIC FUNCTION WITH AN ESTIMATED EF = >55 % NORMAL RIGHT VENTRICULAR SYSTOLIC FUNCTION MILD TRICUSPID, MITRAL AND AORTIC VALVE INSUFFICIENCY NO VALVULAR STENOSIS MILD RV ENLARGEMENT MILD BIATRIAL ENLARGEMENT MILD LVH MILDLY DILATED AORTIC ROOT MEASURING 3.9 CM GLS: -17.2 %  NM Myocardial Perfusion SPECT multiple (stress and rest): (10/19/2020) IMPRESSION: Normal myocardial fusion scan no evidence of stress-induced myocardial ischemia ejection fraction 50% conclusion negative scan   IMPRESSION: 1. Coronary calcium score of 422. This was 78th percentile for age and sex matched  control.   2. Normal coronary origin with right dominance.   3. Mild proximal RCA and D1 stenosis (25-49%).   4. Minimal LCX and proximal LAD stenosis (<25%).   5. CAD-RADS 2. Mild non-obstructive CAD (25-49%). Consider non-atherosclerotic causes of chest pain. Consider preventive therapy and risk factor modification.   Electronically Signed: By: Debbe Odea M.D. On: 11/09/2021 16:03     Neuro/Psych  Headaches PSYCHIATRIC DISORDERS Anxiety Depression     Neuromuscular disease    GI/Hepatic Neg liver ROS,GERD  ,,  Endo/Other  diabetesHypothyroidism    Renal/GU      Musculoskeletal   Abdominal   Peds  Hematology negative hematology ROS (+)   Anesthesia Other Findings Hypothyroidism  Hyperlipidemia Hypertension  OSA on CPAP Wears hearing aid in both ears Depression Carpal tunnel syndrome on both sides GERD (gastroesophageal reflux disease) T2DM (type 2 diabetes mellitus)  Migraines Allergic rhinitis Aortic atherosclerosis  Anginal pain (HCC) DOE (dyspnea on exertion) CKD (chronic kidney disease), stage III Chronic lower back pain Colloid LEFT thyroid nodule  Hepatic steatosis Hypogonadism in male  Carpal tunnel syndrome on left Occipital neuralgia  Osteoarthritis Pulmonary nodules  Renal cyst, left Vitiligo  Diastolic dysfunction    Reproductive/Obstetrics negative OB ROS                             Anesthesia Physical Anesthesia Plan  ASA: 3  Anesthesia Plan: General   Post-op Pain Management: Minimal or no pain anticipated   Induction: Intravenous  PONV Risk Score and Plan: 3 and Propofol infusion, TIVA and Ondansetron  Airway Management Planned: Nasal Cannula  Additional Equipment: None  Intra-op Plan:   Post-operative Plan:   Informed Consent: I  have reviewed the patients History and Physical, chart, labs and discussed the procedure including the risks, benefits and alternatives for the proposed  anesthesia with the patient or authorized representative who has indicated his/her understanding and acceptance.     Dental advisory given  Plan Discussed with: CRNA and Surgeon  Anesthesia Plan Comments: (Discussed risks of anesthesia with patient, including possibility of difficulty with spontaneous ventilation under anesthesia necessitating airway intervention, PONV, and rare risks such as cardiac or respiratory or neurological events, and allergic reactions. Discussed the role of CRNA in patient's perioperative care. Patient understands.)       Anesthesia Quick Evaluation

## 2023-02-18 NOTE — Progress Notes (Signed)
Cardiology note:  Unsuccessful TEE attempt. With initial attempt at esophageal intubation, pt obstructed and became apneic, desatting to the 60s. Required bag mask ventilation to bring sats back up. Reattempted again x1 with similar result. Decision made to stop procedure. Given large body habitus, high sedation requirement, severe HTN during the case, and severe untreated OSA, may be advisable to reattempt TEE only after achieving a more secure airway.  Tiajuana Amass MD [facility]

## 2023-02-19 ENCOUNTER — Encounter: Payer: Self-pay | Admitting: Cardiovascular Disease

## 2023-02-19 NOTE — Progress Notes (Signed)
Patient was not able to get TEE/cardioversion done D/T difficulty maintaining O2 sats/obstructing airway and difficulty inserting TEE probe. Will need to reschedule.

## 2023-02-26 ENCOUNTER — Ambulatory Visit: Payer: Medicare Other | Admitting: Infectious Diseases

## 2023-02-28 ENCOUNTER — Encounter: Payer: Self-pay | Admitting: Infectious Diseases

## 2023-02-28 ENCOUNTER — Ambulatory Visit: Payer: Medicare Other | Attending: Infectious Diseases | Admitting: Infectious Diseases

## 2023-02-28 ENCOUNTER — Other Ambulatory Visit: Payer: Self-pay | Admitting: *Deleted

## 2023-02-28 ENCOUNTER — Telehealth: Payer: Self-pay | Admitting: Urology

## 2023-02-28 ENCOUNTER — Encounter: Payer: Self-pay | Admitting: Urology

## 2023-02-28 VITALS — BP 168/101 | HR 63 | Temp 98.2°F | Ht 72.0 in | Wt 245.0 lb

## 2023-02-28 DIAGNOSIS — M009 Pyogenic arthritis, unspecified: Secondary | ICD-10-CM | POA: Insufficient documentation

## 2023-02-28 DIAGNOSIS — Z9049 Acquired absence of other specified parts of digestive tract: Secondary | ICD-10-CM | POA: Diagnosis not present

## 2023-02-28 DIAGNOSIS — B954 Other streptococcus as the cause of diseases classified elsewhere: Secondary | ICD-10-CM

## 2023-02-28 DIAGNOSIS — B9689 Other specified bacterial agents as the cause of diseases classified elsewhere: Secondary | ICD-10-CM

## 2023-02-28 DIAGNOSIS — M00812 Arthritis due to other bacteria, left shoulder: Secondary | ICD-10-CM | POA: Diagnosis not present

## 2023-02-28 DIAGNOSIS — E039 Hypothyroidism, unspecified: Secondary | ICD-10-CM | POA: Diagnosis not present

## 2023-02-28 DIAGNOSIS — I129 Hypertensive chronic kidney disease with stage 1 through stage 4 chronic kidney disease, or unspecified chronic kidney disease: Secondary | ICD-10-CM | POA: Diagnosis not present

## 2023-02-28 DIAGNOSIS — Z9889 Other specified postprocedural states: Secondary | ICD-10-CM | POA: Diagnosis not present

## 2023-02-28 DIAGNOSIS — N183 Chronic kidney disease, stage 3 unspecified: Secondary | ICD-10-CM | POA: Diagnosis not present

## 2023-02-28 DIAGNOSIS — G4733 Obstructive sleep apnea (adult) (pediatric): Secondary | ICD-10-CM | POA: Insufficient documentation

## 2023-02-28 DIAGNOSIS — E785 Hyperlipidemia, unspecified: Secondary | ICD-10-CM | POA: Insufficient documentation

## 2023-02-28 DIAGNOSIS — M1711 Unilateral primary osteoarthritis, right knee: Secondary | ICD-10-CM | POA: Insufficient documentation

## 2023-02-28 DIAGNOSIS — G4730 Sleep apnea, unspecified: Secondary | ICD-10-CM | POA: Diagnosis not present

## 2023-02-28 NOTE — Telephone Encounter (Signed)
 Faxed over clearance to [place] office .

## 2023-02-28 NOTE — Progress Notes (Signed)
 NAME: Jacob Lawson  DOB: 04/19/1955  MRN: [id number]  Date/Time: 02/28/2023 9:24 AM  Follow up visit Subjective:   Folow up visit  for septic arthritis left shoulder - here with his wife [name] is a 68 y.o. male with a history of hypertension, hypothyroidism, sleep apnea, hyperlipidemia , h/o left foot abscess in 2020, DJD, rt knee medial menisceal tear repair, underwent extensive left rotator cuff repair and reconstruction on 09/07/22 which included biceps tenodesis,subacromial decompression.xtensive debridement of shoulder (glenohumeral and subacromial spaces) and distal clavicle excision He developed a small area of erythematous swelling  at the surgical site and was given bactrim on 10/23/22 and as there was worsening he was admitted and underwent I/D and extensive glenohumeral debridement and synovectomy and removal of any hardware on 11/20/22.  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 He took Amoxicillin  for a mont after that HE is off antibiotics and waiting to have surgery He has Afib and that will have to be taken care of first He went for TEE but they could not do it  Past Medical History:  Diagnosis Date   Acquired hypothyroidism    Allergic rhinitis    Anginal pain (HCC)    Aortic atherosclerosis (HCC)    Carpal tunnel syndrome on both sides    Carpal tunnel syndrome on left    a.) s/p release   Chronic lower back pain    CKD (chronic kidney disease), stage III (HCC)    Colloid LEFT thyroid  nodule    Depression    Diastolic dysfunction    a.) TTE 10/19/2020: EF >55%; mild LVH, mile BAE, mild RV enlargement; triv AR/TR/PR, mild MR; G1DD.   Difficult airway for intubation    DOE (dyspnea on exertion)    Generalized anxiety disorder    GERD (gastroesophageal reflux disease)    Hepatic steatosis    Hyperlipidemia    Hypertension    Hypogonadism in male    a.) s/p Testim  implant in  2014. b.) uses exogenous TD testosterone    Hypothyroidism    Migraines    Mild mitral regurgitation by prior echocardiogram    Occipital neuralgia    OSA on CPAP    Osteoarthritis    Pulmonary nodules    Renal cyst, left    T2DM (type 2 diabetes mellitus) (HCC)    Vitiligo    Wears hearing aid in both ears     Past Surgical History:  Procedure Laterality Date   APPENDECTOMY     ARTHOSCOPIC ROTAOR CUFF REPAIR Left 11/20/2022   Procedure: Left shoulder open irrigation and debridement, arthroscopic irrigation and debridement and extensive debridment;  Surgeon: Tobie Priest, MD;  Location: [facility];  Service: Orthopedics;  Laterality: Left;   CARDIOVERSION N/A 02/18/2023   Procedure: CARDIOVERSION;  Surgeon: Hilarie Rocher, MD;  Location: [facility];  Service: Cardiovascular;  Laterality: N/A;   CARPAL TUNNEL RELEASE Right 11/2003   CARPAL TUNNEL RELEASE Left 11/19/2017   Procedure: LEFT OPEN CARPAL TUNNEL RELEASE;  Surgeon: Maryl Barters, MD;  Location: [facility];  Service: Orthopedics;  Laterality: Left;  Diabetic - oral meds Sleep Apnea   CHONDROPLASTY Right 03/22/2020   Procedure: CHONDROPLASTY;  Surgeon: Edie Norleen PARAS, MD;  Location: [facility];  Service: Orthopedics;  Laterality: Right;  ABRASION CHONDROPLASTY   COLONOSCOPY     2/91, 6/00, 01/04, 7/08, 10/10, 12/13, 02/26/17   COLONOSCOPY WITH PROPOFOL  N/A 03/07/2020  Procedure: COLONOSCOPY WITH PROPOFOL ;  Surgeon: Maryruth Ole DASEN, MD;  Location: [facility];  Service: Endoscopy;  Laterality: N/A;   ESOPHAGOGASTRODUODENOSCOPY     2/91, 1/95, 6/00, 1/04, 7/08, 10/10   ESOPHAGOGASTRODUODENOSCOPY  03/07/2020   Procedure: ESOPHAGOGASTRODUODENOSCOPY (EGD);  Surgeon: Maryruth Ole DASEN, MD;  Location: [facility];  Service: Endoscopy;;   EXCISION NEUROMA Left 06/2007   foot  had this done twice   FINGER SURGERY Bilateral    for congenital webbing   GANGLION CYST EXCISION Right 09/23/2019   Procedure: REMOVAL GANGLION  OF FLEXOR SHEATH RIGHT LONG FINGER;  Surgeon: Edie Norleen PARAS, MD;  Location: [facility];  Service: Orthopedics;  Laterality: Right;   IRRIGATION AND DEBRIDEMENT FOOT Left 11/09/2018   Procedure: IRRIGATION AND DEBRIDEMENT FOOT;  Surgeon: Neill Boas, DPM;  Location: [facility];  Service: Podiatry;  Laterality: Left;   KNEE ARTHROSCOPY Right 03/22/2020   Procedure: RIGHT KNEE ARTHROSCOPY WITH PARTIAL MENISCECTOMY;  Surgeon: Edie Norleen PARAS, MD;  Location: [facility];  Service: Orthopedics;  Laterality: Right;   KNEE ARTHROSCOPY Right 05/25/2021   Procedure: RIGHT KNEE ARTHROSCOPY WITH DEBRIDEMENT AND PARTIAL MEDIAL MENISCECTOMY;  Surgeon: Edie Norleen PARAS, MD;  Location: [facility];  Service: Orthopedics;  Laterality: Right;   KNEE ARTHROSCOPY WITH MENISCAL REPAIR Right 03/22/2020   Procedure: KNEE ARTHROSCOPY WITH MENISCAL REPAIR;  Surgeon: Edie Norleen PARAS, MD;  Location: [facility];  Service: Orthopedics;  Laterality: Right;   LASIK Bilateral    MANDIBLE SURGERY     in late 30s, fo overbite   PROSTATE BIOPSY  12/2015   ROTATOR CUFF REPAIR Left 02/2005   SHOULDER ARTHROSCOPY WITH DEBRIDEMENT AND BICEP TENDON REPAIR Left 11/20/2022   Procedure: Left shoulder open irrigation and debridement, arthroscopic irrigation and debridement and extensive debridment;  Surgeon: Tobie Priest, MD;  Location: [facility];  Service: Orthopedics;  Laterality: Left;   SHOULDER ARTHROSCOPY WITH SUBACROMIAL DECOMPRESSION AND OPEN ROTATOR C Right 07/16/2019   Procedure: Right shoulder arthroscopy with debridement, decompression, possible rotator cuff repair, and probable biceps tenodesis;  Surgeon: Edie Norleen PARAS, MD;  Location: [facility];  Service: Orthopedics;  Laterality: Right;   TEE WITHOUT CARDIOVERSION N/A 02/18/2023   Procedure: TRANSESOPHAGEAL ECHOCARDIOGRAM (TEE);  Surgeon: Hilarie Rocher, MD;  Location: [facility];  Service: Cardiovascular;  Laterality: N/A;   THYROIDECTOMY, PARTIAL     TONSILLECTOMY     and adenoidectomy    Social  History   Socioeconomic History   Marital status: Married    Spouse name: Not on file   Number of children: Not on file   Years of education: Not on file   Highest education level: Not on file  Occupational History   Not on file  Tobacco Use   Smoking status: Never   Smokeless tobacco: Never  Vaping Use   Vaping status: Never Used  Substance and Sexual Activity   Alcohol use: Yes    Alcohol/week: 1.0 - 2.0 standard drink of alcohol    Types: 1 - 2 Cans of beer per week    Comment: occasional beer   Drug use: No   Sexual activity: Yes    Partners: Female    Birth control/protection: None  Other Topics Concern   Not on file  Social History Narrative   Not on file   Social Drivers of Health   Financial Resource Strain: Low Risk  (08/30/2022)   Received from [facility]   Overall Financial Resource Strain (CARDIA)    Difficulty of Paying Living Expenses:  Not hard at all  Food Insecurity: No Food Insecurity (11/20/2022)   Hunger Vital Sign    Worried About Running Out of Food in the Last Year: Never true    Ran Out of Food in the Last Year: Never true  Transportation Needs: No Transportation Needs (11/20/2022)   PRAPARE - Administrator, Civil Service (Medical): No    Lack of Transportation (Non-Medical): No  Physical Activity: Not on file  Stress: Not on file  Social Connections: Not on file  Intimate Partner Violence: Not At Risk (11/20/2022)   Humiliation, Afraid, Rape, and Kick questionnaire    Fear of Current or Ex-Partner: No    Emotionally Abused: No    Physically Abused: No    Sexually Abused: No    Family History  Problem Relation Age of Onset   Dementia Mother    Pancreatic cancer Father    Prostate cancer Brother    Allergies  Allergen Reactions   Lamictal [Lamotrigine] Hives   Pravastatin Other (See Comments)    Muscle joint/pain.   Simvastatin Other (See Comments)    Muscle aches   Statins Other (See Comments)     Muscle/joint aches    I? Current Outpatient Medications  Medication Sig Dispense Refill   amiodarone (PACERONE) 200 MG tablet Take 200 mg by mouth daily at 12 noon.     apixaban  (ELIQUIS ) 5 MG TABS tablet Take 5 mg by mouth 2 (two) times daily.     busPIRone (BUSPAR) 7.5 MG tablet Take 7.5 mg by mouth 2 (two) times daily.     carvedilol  (COREG ) 6.25 MG tablet Take 6.25 mg by mouth 2 (two) times daily with a meal.     Cholecalciferol  (VITAMIN D3) 125 MCG (5000 UT) TABS Take 5,000 Units by mouth in the morning.     Cyanocobalamin  (VITAMIN B-12 PO) Take 250 mcg by mouth in the morning.     empagliflozin (JARDIANCE) 10 MG TABS tablet Take 10 mg by mouth in the morning.     esomeprazole (NEXIUM) 20 MG capsule Take 20 mg by mouth daily as needed (acid reflux/indigestion.).     finasteride  (PROSCAR ) 5 MG tablet TAKE 1 TABLET BY MOUTH EVERY DAY (Patient taking differently: Take 5 mg by mouth every evening.) 90 tablet 3   fluticasone  (FLONASE ) 50 MCG/ACT nasal spray Place 1-2 sprays into both nostrils daily as needed for allergies.      levothyroxine  (SYNTHROID ) 150 MCG tablet Take 150 mcg by mouth daily before breakfast.     naproxen sodium (ALEVE) 220 MG tablet Take 440 mg by mouth 2 (two) times daily as needed (pain.).     rosuvastatin  (CRESTOR ) 5 MG tablet Take 5 mg by mouth every Monday, Wednesday, and Friday. At night     sertraline  (ZOLOFT ) 100 MG tablet Take 100 mg by mouth in the morning.     tadalafil  (CIALIS ) 20 MG tablet Take 40-100 mg by mouth daily as needed for erectile dysfunction.     Testosterone  Enanthate (XYOSTED ) 100 MG/0.5ML SOAJ Inject 100 mg into the skin every Wednesday.     valsartan (DIOVAN) 80 MG tablet Take 80 mg by mouth in the morning.     zinc  sulfate 220 (50 Zn) MG capsule Take 220 mg by mouth in the morning.     No current facility-administered medications for this visit.     Abtx:  Anti-infectives (From admission, onward)    None       REVIEW OF SYSTEMS:  Const: negative fever, negative chills, negative weight loss Eyes: negative diplopia or visual changes, negative eye pain ENT: negative coryza, negative sore throat Resp: negative cough, hemoptysis, dyspnea Cards: negative for chest pain, has palpitations, lower extremity edema GU: negative for frequency, dysuria and hematuria GI: Negative for abdominal pain, diarrhea, bleeding, constipation Skin: negative for rash and pruritus Heme: negative for easy bruising and gum/nose bleeding FD:opfpuzi movt left shoulder, no pain or swelling Neurolo:negative for headaches, dizziness, vertigo, memory problems  Psych: negative for feelings of anxiety, depression  Endocrine: negative for thyroid , diabetes Allergy/Immunology- as above Objective:  VITALS:  Temp 98.2 F (36.8 C) (Temporal)   Ht 6' (1.829 m)   Wt 245 lb (111.1 kg)   BMI 33.23 kg/m   PHYSICAL EXAM:  General: Alert, cooperative, no distress, appears stated age.  Head: Normocephalic, without obvious abnormality, atraumatic. Eyes: Conjunctivae clear, anicteric sclerae. Pupils are equal ENT Nares normal. No drainage or sinus tenderness. Lips, mucosa, and tongue normal. No Thrush Neck: Supple, symmetrical, no adenopathy, thyroid : non tender no carotid bruit and no JVD. Back: No CVA tenderness. Lungs: Clear to auscultation bilaterally. No Wheezing or Rhonchi. No rales. Heart: irregular Abdomen: not examined Extremities: left shoulder- surical site healed very well  No erythema or discharge Skin: No rashes or lesions. Or bruising Lymph: Cervical, supraclavicular normal. 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 Followed by oral antibiotics:Amoxicillin  TID and Doxycycline  100mg  BID X 4 weeks ( amoxicillin   will be 1 gram TID for 2 weeks followed by 500mg  TID for 2 weeks. -completed total of 8 weeks Off antibiotics -Coordinate with Dr. Tobie regarding surgery date and preoperative culture.   Afib- awaiting cardioversion Dsicussed with cardiologist- They will call him with appt  ?_______ Discussed with patient,in detail Discussed with Dr.Patel if feasible they will aspirate the shoulder and send for culture before surgery Benzoyl peroxide 5% wash shoulder for a few days before surgery to reduce propionibacterium acnes bioburden Follow with me PRN Note:  This document was prepared using Dragon voice recognition software and may include unintentional dictation errors.

## 2023-02-28 NOTE — Patient Instructions (Signed)
 You are here for left shoulder infection with p.acnes- finished treatment with antibiotics- your shoulder arthroplasty is postponed because of afib and you need that to be fixed first- before surgery you may want to ask Dr.patel about Benzoyl peroxide ( 5%) wash for the skin on shoulders to reduce this bacteria

## 2023-02-28 NOTE — Telephone Encounter (Signed)
 Mr. Jacob Lawson is requesting a refill on his testosterone  medication, but he recently desatted on the table when they were trying to perform a heart echo due to severe untreated sleep apnea.  We will need cardiac clearance before we can refill the testosterone  medication.  His hemoglobin and hematocrit levels are starting to rise and I do not know if it safe for him to donate blood with his condition to manage these levels.

## 2023-03-05 ENCOUNTER — Other Ambulatory Visit: Payer: Self-pay | Admitting: Urology

## 2023-03-05 DIAGNOSIS — E291 Testicular hypofunction: Secondary | ICD-10-CM

## 2023-03-05 MED ORDER — XYOSTED 100 MG/0.5ML ~~LOC~~ SOAJ: 100.0000 mg | SUBCUTANEOUS | 1 refills | Status: DC

## 2023-03-05 NOTE — Progress Notes (Signed)
Notified patient as instructed, patient pleased

## 2023-03-05 NOTE — Progress Notes (Unsigned)
Would you let Jacob Lawson know that his cardiologist has approved the testoterone refill and stated it was safe for him to donate blood?  I have refilled his [name] and he has appointment in March for labs and to see Dr. Lonna Cobb.

## 2023-03-17 NOTE — H&P (Signed)
 CARDIOLOGY CONSULT NOTE               Patient ID: Jacob Lawson MRN: [id number] DOB/AGE: 1955-08-01 68 y.o.  Admit date: 03/18/23 Referring Physician Dr. Sampson Goon primary Primary Physician  Primary Cardiologist [facility] Reason for Consultation cardioversion for atrial fibrillation  HPI: 68 year old white male history of paroxysmal atrial fibrillation recent onset has had some left shoulder pain shortness of breath dyspnea palpitation fatigue abnormal EKG recently had an attempt at TEE cardioversion but with sleep apnea it became prohibitive to try to proceed with the procedure and it was canceled patient has been on anticoagulation for at least 4 weeks and will proceed with just cardioversion now.  Patient has done reasonably well no bleeding episodes has been compliant with medication  Review of systems complete and found to be negative unless listed above     Past Medical History:  Diagnosis Date   Acquired hypothyroidism    Allergic rhinitis    Anginal pain (HCC)    Aortic atherosclerosis (HCC)    Carpal tunnel syndrome on both sides    Carpal tunnel syndrome on left    a.) s/p release   Chronic lower back pain    CKD (chronic kidney disease), stage III (HCC)    Colloid LEFT thyroid nodule    Depression    Diastolic dysfunction    a.) TTE 10/19/2020: EF >55%; mild LVH, mile BAE, mild RV enlargement; triv AR/TR/PR, mild MR; G1DD.   Difficult airway for intubation    DOE (dyspnea on exertion)    Generalized anxiety disorder    GERD (gastroesophageal reflux disease)    Hepatic steatosis    Hyperlipidemia    Hypertension    Hypogonadism in male    a.) s/p Testim implant in 2014. b.) uses exogenous TD testosterone   Hypothyroidism    Migraines    Mild mitral regurgitation by prior echocardiogram    Occipital neuralgia    OSA on CPAP    Osteoarthritis    Pulmonary nodules    Renal cyst, left    T2DM (type 2 diabetes mellitus) (HCC)    Vitiligo    Wears  hearing aid in both ears     Past Surgical History:  Procedure Laterality Date   APPENDECTOMY     ARTHOSCOPIC ROTAOR CUFF REPAIR Left 11/20/2022   Procedure: Left shoulder open irrigation and debridement, arthroscopic irrigation and debridement and extensive debridment;  Surgeon: Signa Kell, MD;  Location: [facility];  Service: Orthopedics;  Laterality: Left;   CARDIOVERSION N/A 02/18/2023   Procedure: CARDIOVERSION;  Surgeon: Tiajuana Amass, MD;  Location: [facility];  Service: Cardiovascular;  Laterality: N/A;   CARPAL TUNNEL RELEASE Right 11/2003   CARPAL TUNNEL RELEASE Left 11/19/2017   Procedure: LEFT OPEN CARPAL TUNNEL RELEASE;  Surgeon: Erin Sons, MD;  Location: [facility];  Service: Orthopedics;  Laterality: Left;  Diabetic - oral meds Sleep Apnea   CHONDROPLASTY Right 03/22/2020   Procedure: CHONDROPLASTY;  Surgeon: Christena Flake, MD;  Location: [facility];  Service: Orthopedics;  Laterality: Right;  ABRASION CHONDROPLASTY   COLONOSCOPY     2/91, 6/00, 01/04, 7/08, 10/10, 12/13, 02/26/17   COLONOSCOPY WITH PROPOFOL N/A 03/07/2020   Procedure: COLONOSCOPY WITH PROPOFOL;  Surgeon: Regis Bill, MD;  Location: [facility] END[facility]COPY;  Service: Endoscopy;  Laterality: N/A;   ESOPHAGOGASTRODUODENOSCOPY     2/91, 1/95, 6/00, 1/04, 7/08, 10/10   ESOPHAGOGASTRODUODENOSCOPY  03/07/2020   Procedure: ESOPHAGOGASTRODUODENOSCOPY (EGD);  Surgeon: Regis Bill, MD;  Location: [facility];  Service: Endoscopy;;   EXCISION NEUROMA Left 06/2007   foot  had this done twice   FINGER SURGERY Bilateral    for congenital webbing   GANGLION CYST EXCISION Right 09/23/2019   Procedure: REMOVAL GANGLION OF FLEXOR SHEATH RIGHT LONG FINGER;  Surgeon: Christena Flake, MD;  Location: [facility];  Service: Orthopedics;  Laterality: Right;   IRRIGATION AND DEBRIDEMENT FOOT Left 11/09/2018   Procedure: IRRIGATION AND DEBRIDEMENT FOOT;  Surgeon: Linus Galas, DPM;  Location: [facility];  Service: Podiatry;   Laterality: Left;   KNEE ARTHROSCOPY Right 03/22/2020   Procedure: RIGHT KNEE ARTHROSCOPY WITH PARTIAL MENISCECTOMY;  Surgeon: Christena Flake, MD;  Location: [facility];  Service: Orthopedics;  Laterality: Right;   KNEE ARTHROSCOPY Right 05/25/2021   Procedure: RIGHT KNEE ARTHROSCOPY WITH DEBRIDEMENT AND PARTIAL MEDIAL MENISCECTOMY;  Surgeon: Christena Flake, MD;  Location: [facility];  Service: Orthopedics;  Laterality: Right;   KNEE ARTHROSCOPY WITH MENISCAL REPAIR Right 03/22/2020   Procedure: KNEE ARTHROSCOPY WITH MENISCAL REPAIR;  Surgeon: Christena Flake, MD;  Location: [facility];  Service: Orthopedics;  Laterality: Right;   LASIK Bilateral    MANDIBLE SURGERY     in late 30s, fo overbite   PROSTATE BIOPSY  12/2015   ROTATOR CUFF REPAIR Left 02/2005   SHOULDER ARTHROSCOPY WITH DEBRIDEMENT AND BICEP TENDON REPAIR Left 11/20/2022   Procedure: Left shoulder open irrigation and debridement, arthroscopic irrigation and debridement and extensive debridment;  Surgeon: Signa Kell, MD;  Location: [facility];  Service: Orthopedics;  Laterality: Left;   SHOULDER ARTHROSCOPY WITH SUBACROMIAL DECOMPRESSION AND OPEN ROTATOR C Right 07/16/2019   Procedure: Right shoulder arthroscopy with debridement, decompression, possible rotator cuff repair, and probable biceps tenodesis;  Surgeon: Christena Flake, MD;  Location: [facility];  Service: Orthopedics;  Laterality: Right;   TEE WITHOUT CARDIOVERSION N/A 02/18/2023   Procedure: TRANSESOPHAGEAL ECHOCARDIOGRAM (TEE);  Surgeon: Tiajuana Amass, MD;  Location: [facility];  Service: Cardiovascular;  Laterality: N/A;   THYROIDECTOMY, PARTIAL     TONSILLECTOMY     and adenoidectomy    No medications prior to admission.   Social History   Socioeconomic History   Marital status: Married    Spouse name: Not on file   Number of children: Not on file   Years of education: Not on file   Highest education level: Not on file  Occupational History   Not on file  Tobacco Use    Smoking status: Never   Smokeless tobacco: Never  Vaping Use   Vaping status: Never Used  Substance and Sexual Activity   Alcohol use: Yes    Alcohol/week: 1.0 - 2.0 standard drink of alcohol    Types: 1 - 2 Cans of beer per week    Comment: occasional beer   Drug use: No   Sexual activity: Yes    Partners: Female    Birth control/protection: None  Other Topics Concern   Not on file  Social History Narrative   Not on file   Social Drivers of Health   Financial Resource Strain: Low Risk  (08/30/2022)   Received from [facility]   Overall Financial Resource Strain (CARDIA)    Difficulty of Paying Living Expenses: Not hard at all  Food Insecurity: No Food Insecurity (11/20/2022)   Hunger Vital Sign    Worried About Running Out of Food in the Last Year: Never true    Ran Out of Food in the Last Year: Never  true  Transportation Needs: No Transportation Needs (11/20/2022)   PRAPARE - Administrator, Civil Service (Medical): No    Lack of Transportation (Non-Medical): No  Physical Activity: Not on file  Stress: Not on file  Social Connections: Not on file  Intimate Partner Violence: Not At Risk (11/20/2022)   Humiliation, Afraid, Rape, and Kick questionnaire    Fear of Current or Ex-Partner: No    Emotionally Abused: No    Physically Abused: No    Sexually Abused: No    Family History  Problem Relation Age of Onset   Dementia Mother    Pancreatic cancer Father    Prostate cancer Brother       Review of systems complete and found to be negative unless listed above      PHYSICAL EXAM  General: Well developed, well nourished, in no acute distress HEENT:  Normocephalic and atramatic Neck:  No JVD.  Lungs: Clear bilaterally to auscultation and percussion. Heart: Irregularly irregular. Normal S1 and S2 without gallops or murmurs.  Abdomen: Bowel sounds are positive, abdomen soft and non-tender  Msk:  Back normal, normal gait. Normal  strength and tone for age. Extremities: No clubbing, cyanosis or edema.   Neuro: Alert and oriented X 3. Psych:  Good affect, responds appropriately  Labs:   Lab Results  Component Value Date   WBC 6.6 12/25/2022   HGB 16.8 12/25/2022   HCT 52.1 (H) 12/25/2022   MCV 81.0 12/25/2022   PLT 231 12/25/2022   No results for input(s): "NA", "K", "CL", "CO2", "BUN", "CREATININE", "CALCIUM", "PROT", "BILITOT", "ALKPHOS", "ALT", "AST", "GLUCOSE" in the last 168 hours.  Invalid input(s): "LABALBU" Lab Results  Component Value Date   CKTOTAL 217 11/23/2022    Lab Results  Component Value Date   CHOL 120 11/09/2018   Lab Results  Component Value Date   HDL 12 (L) 11/09/2018   Lab Results  Component Value Date   LDLCALC 60 11/09/2018   Lab Results  Component Value Date   TRIG 239 (H) 11/09/2018   Lab Results  Component Value Date   CHOLHDL 10.0 11/09/2018   No results found for: "LDLDIRECT"    Radiology: No results found.  EKG: Atrial fibrillation rate reasonably controlled at around 80 nonspecific ST-T wave changes  ASSESSMENT AND PLAN:  Atrial fibrillation Hypertension Obstructive sleep apnea Obesity Diabetes Renal insufficiency Hyperlipidemia GERD . Plan Proceed with direct DCCV cardioversion for atrial fibrillation Patient is maintain anticoagulation Eliquis 5 mg twice a day carvedilol for rate amiodarone has been started for rhythm 200 mg a day Will have anesthesia handle sedation Consider referral to EP for possible consideration for ablation Risk and benefits of cardioversion has been detailed patient understands and agrees to proceed with the procedure  Signed: Alwyn Pea MD, 03/17/2023, 11:10 PM

## 2023-03-18 ENCOUNTER — Encounter: Payer: Self-pay | Admitting: Internal Medicine

## 2023-03-18 ENCOUNTER — Ambulatory Visit: Payer: Medicare Other | Admitting: Anesthesiology

## 2023-03-18 ENCOUNTER — Encounter
Admission: RE | Disposition: A | Discharge status: Home / Self Care | Payer: Self-pay | Source: Ambulatory Visit | Attending: Internal Medicine

## 2023-03-18 ENCOUNTER — Ambulatory Visit
Admission: RE | Admit: 2023-03-18 | Discharge: 2023-03-18 | Disposition: A | Discharge status: Home / Self Care | Payer: Medicare Other | Source: Ambulatory Visit | Attending: Internal Medicine | Admitting: Internal Medicine

## 2023-03-18 ENCOUNTER — Other Ambulatory Visit: Payer: Self-pay

## 2023-03-18 DIAGNOSIS — Z7901 Long term (current) use of anticoagulants: Secondary | ICD-10-CM | POA: Insufficient documentation

## 2023-03-18 DIAGNOSIS — I4891 Unspecified atrial fibrillation: Secondary | ICD-10-CM | POA: Diagnosis not present

## 2023-03-18 DIAGNOSIS — I480 Paroxysmal atrial fibrillation: Secondary | ICD-10-CM | POA: Insufficient documentation

## 2023-03-18 DIAGNOSIS — I4819 Other persistent atrial fibrillation: Secondary | ICD-10-CM

## 2023-03-18 DIAGNOSIS — I129 Hypertensive chronic kidney disease with stage 1 through stage 4 chronic kidney disease, or unspecified chronic kidney disease: Secondary | ICD-10-CM | POA: Insufficient documentation

## 2023-03-18 DIAGNOSIS — Z79899 Other long term (current) drug therapy: Secondary | ICD-10-CM | POA: Diagnosis not present

## 2023-03-18 DIAGNOSIS — E1122 Type 2 diabetes mellitus with diabetic chronic kidney disease: Secondary | ICD-10-CM | POA: Diagnosis not present

## 2023-03-18 DIAGNOSIS — G4733 Obstructive sleep apnea (adult) (pediatric): Secondary | ICD-10-CM | POA: Insufficient documentation

## 2023-03-18 DIAGNOSIS — R9431 Abnormal electrocardiogram [ECG] [EKG]: Secondary | ICD-10-CM | POA: Diagnosis not present

## 2023-03-18 DIAGNOSIS — E039 Hypothyroidism, unspecified: Secondary | ICD-10-CM | POA: Diagnosis not present

## 2023-03-18 DIAGNOSIS — I209 Angina pectoris, unspecified: Secondary | ICD-10-CM | POA: Diagnosis not present

## 2023-03-18 DIAGNOSIS — N183 Chronic kidney disease, stage 3 unspecified: Secondary | ICD-10-CM | POA: Diagnosis not present

## 2023-03-18 HISTORY — PX: CARDIOVERSION: SHX1299

## 2023-03-18 SURGERY — CARDIOVERSION
Anesthesia: General

## 2023-03-18 MED ORDER — PROPOFOL 10 MG/ML IV BOLUS
INTRAVENOUS | Status: DC | PRN
  Administered 2023-03-18: 60 mg via INTRAVENOUS
  Administered 2023-03-18: 30 mg via INTRAVENOUS

## 2023-03-18 MED ORDER — SODIUM CHLORIDE 0.9 % IV SOLN: INTRAVENOUS | Status: DC

## 2023-03-18 NOTE — Transfer of Care (Signed)
 Immediate Anesthesia Transfer of Care Note  Patient: Jacob Lawson  Procedure(s) Performed: CARDIOVERSION  Patient Location: Short Stay  Anesthesia Type:General  Level of Consciousness: drowsy  Airway & Oxygen Therapy: Patient Spontanous Breathing and Patient connected to face mask oxygen  Post-op Assessment: Report given to RN, Post -op Vital signs reviewed and stable, and Patient moving all extremities  Post vital signs: Reviewed and stable  Last Vitals:  Vitals Value Taken Time  BP 156/103 03/18/23 0742  Temp    Pulse 55 03/18/23 0744  Resp 18 03/18/23 0744  SpO2 96 % 03/18/23 0744  Vitals shown include unfiled device data.  Last Pain:  Vitals:   03/18/23 0742  TempSrc:   PainSc: 0-No pain         Complications: No notable events documented.

## 2023-03-18 NOTE — CV Procedure (Signed)
 Electrical Cardioversion Procedure Note   Procedure: Electrical Cardioversion Indications:  Atrial Fibrillation  Procedure Details Consent: Risks of procedure as well as the alternatives and risks of each were explained to the (patient/caregiver).  Consent for procedure obtained. Time Out: Verified patient identification, verified procedure, site/side was marked, verified correct patient position, special equipment/implants available, medications/allergies/relevent history reviewed, required imaging and test results available.  Performed  Patient placed on cardiac monitor, pulse oximetry, supplemental oxygen as necessary.  Sedation given:  Propofol  by anesthesia Pacer pads placed anterior and posterior chest.  Cardioverted 1 time(s).  Cardioverted at 120J.  Evaluation Findings: Post procedure EKG shows: NSR Complications: None Patient did tolerate procedure well.   Dorothyann Peng MD 03/18/2023 [phone]

## 2023-03-18 NOTE — Anesthesia Preprocedure Evaluation (Signed)
 Anesthesia Evaluation  Patient identified by MRN, date of birth, ID band Patient awake  General Assessment Comment:Prior Grade 1 view with mcgrath.  Reviewed: Allergy & Precautions, H&P , NPO status , Patient's Chart, lab work & pertinent test results  History of Anesthesia Complications Negative for: history of anesthetic complications  Airway Mallampati: IV  TM Distance: >3 FB Neck ROM: Full  Mouth opening: Limited Mouth Opening Comment: Thick neck, limited oral opening, would expect likely difficult airway if intubation needed, would consider videolaryngoscope if intubation needed. Dental  (+) Caps   Pulmonary asthma , sleep apnea and Continuous Positive Airway Pressure Ventilation , neg COPD   Pulmonary exam normal breath sounds clear to auscultation       Cardiovascular hypertension, + angina  + DOE  (-) Past MI and (-) Cardiac Stents + dysrhythmias Atrial Fibrillation  Rhythm:Irregular Rate:Bradycardia  Cardiac MRI: (06/13/2022) IMPRESSION:  1. Normal LV size and function. LVEF 56%.   2. No LV LGE or scar.   3. No evidence for infiltrative disease.   4. Normal RV size and function.   5. No significant valvular abnormalities.   6. No evidence for amyloidosis.   Echocardiogram 2D complete: (05/29/2022) INTERPRETATION NORMAL LEFT VENTRICULAR SYSTOLIC FUNCTION WITH AN ESTIMATED EF = >55 % NORMAL RIGHT VENTRICULAR SYSTOLIC FUNCTION MILD TRICUSPID, MITRAL AND AORTIC VALVE INSUFFICIENCY NO VALVULAR STENOSIS MILD RV ENLARGEMENT MILD BIATRIAL ENLARGEMENT MILD LVH MILDLY DILATED AORTIC ROOT MEASURING 3.9 CM GLS: -17.2 %  NM Myocardial Perfusion SPECT multiple (stress and rest): (10/19/2020) IMPRESSION: Normal myocardial fusion scan no evidence of stress-induced myocardial ischemia ejection fraction 50% conclusion negative scan   IMPRESSION: 1. Coronary calcium score of 422. This was 78th percentile for age and sex matched  control.   2. Normal coronary origin with right dominance.   3. Mild proximal RCA and D1 stenosis (25-49%).   4. Minimal LCX and proximal LAD stenosis (<25%).   5. CAD-RADS 2. Mild non-obstructive CAD (25-49%). Consider non-atherosclerotic causes of chest pain. Consider preventive therapy and risk factor modification.   Electronically Signed: By: Debbe Odea M.D. On: 11/09/2021 16:03     Neuro/Psych  Headaches PSYCHIATRIC DISORDERS Anxiety Depression     Neuromuscular disease    GI/Hepatic Neg liver ROS,GERD  ,,  Endo/Other  diabetesHypothyroidism    Renal/GU      Musculoskeletal   Abdominal   Peds  Hematology negative hematology ROS (+)   Anesthesia Other Findings Hypothyroidism  Hyperlipidemia Hypertension  OSA on CPAP Wears hearing aid in both ears Depression Carpal tunnel syndrome on both sides GERD (gastroesophageal reflux disease) T2DM (type 2 diabetes mellitus)  Migraines Allergic rhinitis Aortic atherosclerosis  Anginal pain (HCC) DOE (dyspnea on exertion) CKD (chronic kidney disease), stage III Chronic lower back pain Colloid LEFT thyroid nodule  Hepatic steatosis Hypogonadism in male  Carpal tunnel syndrome on left Occipital neuralgia  Osteoarthritis Pulmonary nodules  Renal cyst, left Vitiligo  Diastolic dysfunction    Reproductive/Obstetrics negative OB ROS                             Anesthesia Physical Anesthesia Plan  ASA: 3  Anesthesia Plan: General   Post-op Pain Management: Minimal or no pain anticipated   Induction: Intravenous  PONV Risk Score and Plan: 1 and Propofol infusion and TIVA  Airway Management Planned: Nasal Cannula  Additional Equipment: None  Intra-op Plan:   Post-operative Plan:   Informed Consent: I have reviewed the  patients History and Physical, chart, labs and discussed the procedure including the risks, benefits and alternatives for the proposed anesthesia with  the patient or authorized representative who has indicated his/her understanding and acceptance.     Dental advisory given  Plan Discussed with: CRNA and Surgeon  Anesthesia Plan Comments: (Patient consented for risks of anesthesia including but not limited to:  - adverse reactions to medications - risk of airway placement if required - damage to eyes, teeth, lips or other oral mucosa - nerve damage due to positioning  - sore throat or hoarseness - Damage to heart, brain, nerves, lungs, other parts of body or loss of life  Patient voiced understanding and assent.)       Anesthesia Quick Evaluation

## 2023-03-19 ENCOUNTER — Encounter: Payer: Self-pay | Admitting: Internal Medicine

## 2023-03-19 NOTE — Anesthesia Postprocedure Evaluation (Signed)
 Anesthesia Post Note  Patient: ZACK CRAGER  Procedure(s) Performed: CARDIOVERSION  Patient location during evaluation: Specials Recovery Anesthesia Type: General Level of consciousness: awake and alert Pain management: pain level controlled Vital Signs Assessment: post-procedure vital signs reviewed and stable Respiratory status: spontaneous breathing, nonlabored ventilation, respiratory function stable and patient connected to nasal cannula oxygen Cardiovascular status: blood pressure returned to baseline and stable Postop Assessment: no apparent nausea or vomiting Anesthetic complications: no   No notable events documented.   Last Vitals:  Vitals:   03/18/23 0815 03/18/23 0830  BP: (!) 151/98 (!) 157/103  Pulse: (!) 57 (!) 56  Resp: 17 19  Temp:    SpO2: 96% 95%    Last Pain:  Vitals:   03/18/23 0830  TempSrc:   PainSc: 0-No pain                 Louie Boston

## 2023-03-25 ENCOUNTER — Other Ambulatory Visit: Payer: Medicare Other

## 2023-03-25 DIAGNOSIS — E291 Testicular hypofunction: Secondary | ICD-10-CM

## 2023-03-25 DIAGNOSIS — N138 Other obstructive and reflux uropathy: Secondary | ICD-10-CM

## 2023-03-26 LAB — TESTOSTERONE: Testosterone: 141 ng/dL — ABNORMAL LOW (ref 264–916)

## 2023-03-26 LAB — HEMOGLOBIN AND HEMATOCRIT, BLOOD
Hematocrit: 58.2 % — ABNORMAL HIGH (ref 37.5–51.0)
Hemoglobin: 18.2 g/dL — ABNORMAL HIGH (ref 13.0–17.7)

## 2023-03-26 LAB — PSA: Prostate Specific Ag, Serum: 1.8 ng/mL (ref 0.0–4.0)

## 2023-03-29 ENCOUNTER — Ambulatory Visit: Payer: Medicare Other | Admitting: Urology

## 2023-04-03 ENCOUNTER — Encounter: Payer: Self-pay | Admitting: Urology

## 2023-04-03 ENCOUNTER — Ambulatory Visit (INDEPENDENT_AMBULATORY_CARE_PROVIDER_SITE_OTHER): Payer: Medicare Other | Admitting: Urology

## 2023-04-03 VITALS — BP 164/84 | HR 59 | Ht 72.0 in | Wt 245.0 lb

## 2023-04-03 DIAGNOSIS — E291 Testicular hypofunction: Secondary | ICD-10-CM

## 2023-04-03 DIAGNOSIS — D751 Secondary polycythemia: Secondary | ICD-10-CM | POA: Diagnosis not present

## 2023-04-03 DIAGNOSIS — N138 Other obstructive and reflux uropathy: Secondary | ICD-10-CM | POA: Diagnosis not present

## 2023-04-03 DIAGNOSIS — N401 Enlarged prostate with lower urinary tract symptoms: Secondary | ICD-10-CM

## 2023-04-03 DIAGNOSIS — R399 Unspecified symptoms and signs involving the genitourinary system: Secondary | ICD-10-CM

## 2023-04-03 MED ORDER — TAMSULOSIN HCL 0.4 MG PO CAPS: 0.4000 mg | ORAL_CAPSULE | Freq: Every day | ORAL | 0 refills | Status: DC

## 2023-04-03 NOTE — Progress Notes (Signed)
 I, Maysun Anabel Bene, acting as a scribe for Riki Altes, MD., have documented all relevant documentation on the behalf of Riki Altes, MD, as directed by Riki Altes, MD while in the presence of Riki Altes, MD.  04/03/2023 1:16 PM   Jacob Lawson 04/21/1955 [id number]  Referring provider: Mick Sell, MD [address] [place],  [place] [address]  Chief Complaint  Patient presents with   Hypogonadism   Urologic history: 1. BPH   2. Hypogonadism   3. Erectile dysfunction  HPI: Jacob Lawson is a 68 y.o. male presents for follow-up visit.  Cardiology clearance was requested February 2026 to continue testosterone and to assess for any contraindications to donating blood due to a rising hematocrit, and he was given cardiology clearance.  Has not restarted his testosterone and labs 03/25/23 remarkable for a testosterone level of 141, H/H 18.2/58.2, and PSA of 1.8 Complains of significant tiredness and fatigue. He has also noted some perineal discomfort radiating towards the rectum. He does have some post-void dribbling.   PMH: Past Medical History:  Diagnosis Date   Acquired hypothyroidism    Allergic rhinitis    Anginal pain (HCC)    Aortic atherosclerosis (HCC)    Carpal tunnel syndrome on both sides    Carpal tunnel syndrome on left    a.) s/p release   Chronic lower back pain    CKD (chronic kidney disease), stage III (HCC)    Colloid LEFT thyroid nodule    Depression    Diastolic dysfunction    a.) TTE 10/19/2020: EF >55%; mild LVH, mile BAE, mild RV enlargement; triv AR/TR/PR, mild MR; G1DD.   Difficult airway for intubation    DOE (dyspnea on exertion)    Generalized anxiety disorder    GERD (gastroesophageal reflux disease)    Hepatic steatosis    Hyperlipidemia    Hypertension    Hypogonadism in male    a.) s/p Testim implant in 2014. b.) uses exogenous TD testosterone   Hypothyroidism    Migraines    Mild mitral  regurgitation by prior echocardiogram    Occipital neuralgia    OSA on CPAP    Osteoarthritis    Pulmonary nodules    Renal cyst, left    T2DM (type 2 diabetes mellitus) (HCC)    Vitiligo    Wears hearing aid in both ears     Surgical History: Past Surgical History:  Procedure Laterality Date   APPENDECTOMY     ARTHOSCOPIC ROTAOR CUFF REPAIR Left 11/20/2022   Procedure: Left shoulder open irrigation and debridement, arthroscopic irrigation and debridement and extensive debridment;  Surgeon: Signa Kell, MD;  Location: [facility];  Service: Orthopedics;  Laterality: Left;   CARDIOVERSION N/A 02/18/2023   Procedure: CARDIOVERSION;  Surgeon: Tiajuana Amass, MD;  Location: [facility];  Service: Cardiovascular;  Laterality: N/A;   CARDIOVERSION N/A 03/18/2023   Procedure: CARDIOVERSION;  Surgeon: Alwyn Pea, MD;  Location: [facility];  Service: Cardiovascular;  Laterality: N/A;   CARPAL TUNNEL RELEASE Right 11/2003   CARPAL TUNNEL RELEASE Left 11/19/2017   Procedure: LEFT OPEN CARPAL TUNNEL RELEASE;  Surgeon: Erin Sons, MD;  Location: [facility];  Service: Orthopedics;  Laterality: Left;  Diabetic - oral meds Sleep Apnea   CHONDROPLASTY Right 03/22/2020   Procedure: CHONDROPLASTY;  Surgeon: Christena Flake, MD;  Location: [facility];  Service: Orthopedics;  Laterality: Right;  ABRASION CHONDROPLASTY   COLONOSCOPY     2/91,  6/00, 01/04, 7/08, 10/10, 12/13, 02/26/17   COLONOSCOPY WITH PROPOFOL N/A 03/07/2020   Procedure: COLONOSCOPY WITH PROPOFOL;  Surgeon: Regis Bill, MD;  Location: [facility];  Service: Endoscopy;  Laterality: N/A;   ESOPHAGOGASTRODUODENOSCOPY     2/91, 1/95, 6/00, 1/04, 7/08, 10/10   ESOPHAGOGASTRODUODENOSCOPY  03/07/2020   Procedure: ESOPHAGOGASTRODUODENOSCOPY (EGD);  Surgeon: Regis Bill, MD;  Location: [facility];  Service: Endoscopy;;   EXCISION NEUROMA Left 06/2007   foot  had this done twice   FINGER SURGERY Bilateral    for  congenital webbing   GANGLION CYST EXCISION Right 09/23/2019   Procedure: REMOVAL GANGLION OF FLEXOR SHEATH RIGHT LONG FINGER;  Surgeon: Christena Flake, MD;  Location: [facility];  Service: Orthopedics;  Laterality: Right;   IRRIGATION AND DEBRIDEMENT FOOT Left 11/09/2018   Procedure: IRRIGATION AND DEBRIDEMENT FOOT;  Surgeon: Linus Galas, DPM;  Location: [facility];  Service: Podiatry;  Laterality: Left;   KNEE ARTHROSCOPY Right 03/22/2020   Procedure: RIGHT KNEE ARTHROSCOPY WITH PARTIAL MENISCECTOMY;  Surgeon: Christena Flake, MD;  Location: [facility];  Service: Orthopedics;  Laterality: Right;   KNEE ARTHROSCOPY Right 05/25/2021   Procedure: RIGHT KNEE ARTHROSCOPY WITH DEBRIDEMENT AND PARTIAL MEDIAL MENISCECTOMY;  Surgeon: Christena Flake, MD;  Location: [facility];  Service: Orthopedics;  Laterality: Right;   KNEE ARTHROSCOPY WITH MENISCAL REPAIR Right 03/22/2020   Procedure: KNEE ARTHROSCOPY WITH MENISCAL REPAIR;  Surgeon: Christena Flake, MD;  Location: [facility];  Service: Orthopedics;  Laterality: Right;   LASIK Bilateral    MANDIBLE SURGERY     in late 30s, fo overbite   PROSTATE BIOPSY  12/2015   ROTATOR CUFF REPAIR Left 02/2005   SHOULDER ARTHROSCOPY WITH DEBRIDEMENT AND BICEP TENDON REPAIR Left 11/20/2022   Procedure: Left shoulder open irrigation and debridement, arthroscopic irrigation and debridement and extensive debridment;  Surgeon: Signa Kell, MD;  Location: [facility];  Service: Orthopedics;  Laterality: Left;   SHOULDER ARTHROSCOPY WITH SUBACROMIAL DECOMPRESSION AND OPEN ROTATOR C Right 07/16/2019   Procedure: Right shoulder arthroscopy with debridement, decompression, possible rotator cuff repair, and probable biceps tenodesis;  Surgeon: Christena Flake, MD;  Location: [facility];  Service: Orthopedics;  Laterality: Right;   TEE WITHOUT CARDIOVERSION N/A 02/18/2023   Procedure: TRANSESOPHAGEAL ECHOCARDIOGRAM (TEE);  Surgeon: Tiajuana Amass, MD;  Location: [facility];  Service: Cardiovascular;   Laterality: N/A;   THYROIDECTOMY, PARTIAL     TONSILLECTOMY     and adenoidectomy    Home Medications:  Allergies as of 04/03/2023       Reactions   Lamictal [lamotrigine] Hives   Pravastatin Other (See Comments)   Muscle joint/pain.   Simvastatin Other (See Comments)   Muscle aches   Statins Other (See Comments)   Muscle/joint aches        Medication List        Accurate as of April 03, 2023  1:16 PM. If you have any questions, ask your nurse or doctor.          amiodarone 200 MG tablet Commonly known as: PACERONE Take 200 mg by mouth daily at 12 noon.   apixaban 5 MG Tabs tablet Commonly known as: ELIQUIS Take 5 mg by mouth 2 (two) times daily.   busPIRone 7.5 MG tablet Commonly known as: BUSPAR Take 7.5 mg by mouth 2 (two) times daily.   carvedilol 6.25 MG tablet Commonly known as: COREG Take 6.25 mg by mouth 2 (two) times daily with a meal.   esomeprazole  20 MG capsule Commonly known as: NEXIUM Take 20 mg by mouth daily as needed (acid reflux/indigestion.).   finasteride 5 MG tablet Commonly known as: PROSCAR TAKE 1 TABLET BY MOUTH EVERY DAY What changed: when to take this   fluticasone 50 MCG/ACT nasal spray Commonly known as: FLONASE Place 1-2 sprays into both nostrils daily as needed for allergies.   Jardiance 10 MG Tabs tablet Generic drug: empagliflozin Take 10 mg by mouth in the morning.   levothyroxine 175 MCG tablet Commonly known as: SYNTHROID Take 175 mcg by mouth daily before breakfast.   naproxen sodium 220 MG tablet Commonly known as: ALEVE Take 440 mg by mouth 2 (two) times daily as needed (pain.).   rosuvastatin 5 MG tablet Commonly known as: CRESTOR Take 5 mg by mouth every Monday, Wednesday, and Friday. At night   sertraline 100 MG tablet Commonly known as: ZOLOFT Take 100 mg by mouth in the morning.   tadalafil 20 MG tablet Commonly known as: CIALIS Take 20 mg by mouth daily as needed for erectile dysfunction.    tamsulosin 0.4 MG Caps capsule Commonly known as: FLOMAX Take 1 capsule (0.4 mg total) by mouth daily. Started by: Riki Altes   valsartan 80 MG tablet Commonly known as: DIOVAN Take 80 mg by mouth in the morning.   VITAMIN B-12 PO Take 250 mcg by mouth in the morning.   Vitamin D3 125 MCG (5000 UT) Tabs Take 5,000 Units by mouth in the morning.   Xyosted 100 MG/0.5ML Soaj Generic drug: Testosterone Enanthate Inject 0.5 mLs (100 mg total) into the skin every Wednesday.   zinc sulfate (50mg  elemental zinc) 220 (50 Zn) MG capsule Take 220 mg by mouth in the morning.        Allergies:  Allergies  Allergen Reactions   Lamictal [Lamotrigine] Hives   Pravastatin Other (See Comments)    Muscle joint/pain.   Simvastatin Other (See Comments)    Muscle aches   Statins Other (See Comments)    Muscle/joint aches     Family History: Family History  Problem Relation Age of Onset   Dementia Mother    Pancreatic cancer Father    Prostate cancer Brother     Social History:  reports that he has never smoked. He has never used smokeless tobacco. He reports current alcohol use of about 1.0 - 2.0 standard drink of alcohol per week. He reports that he does not use drugs.   Physical Exam: BP (!) 164/84   Pulse (!) 59   Ht 6' (1.829 m)   Wt 245 lb (111.1 kg)   BMI 33.23 kg/m   Constitutional:  Alert and oriented, No acute distress. HEENT: Anton AT, moist mucus membranes.  Trachea midline, no masses. Cardiovascular: No clubbing, cyanosis, or edema. Respiratory: Normal respiratory effort, no increased work of breathing. GI: Abdomen is soft, nontender, nondistended, no abdominal masses Skin: No rashes, bruises or suspicious lesions. Neurologic: Grossly intact, no focal deficits, moving all 4 extremities. Psychiatric: Normal mood and affect.   Assessment & Plan:    1. Hypogonadism Has been off testosterone for several months and H/H elevated at 18.2/58.2 Hematology  referral to assess for other causes of his erythrocytosis.  H/H is normal, can resume testosterone if no contraindications from hematology appear, it has been cleared by cardiology to resume testosterone. Once he restarts testosterone, we'll recheck labs in 6 months and continue annual visit.   2. Lower urinary tract symptoms Associated with perineal pain. Trial tamsulosin  0.4 mg daily as symptoms may be related to prostatic inflammation.  [facility] [address] [place], [place] [address] [phone]

## 2023-04-08 ENCOUNTER — Other Ambulatory Visit: Payer: Self-pay | Admitting: Orthopedic Surgery

## 2023-04-11 ENCOUNTER — Encounter: Payer: Self-pay | Admitting: Oncology

## 2023-04-11 ENCOUNTER — Inpatient Hospital Stay: Attending: Oncology | Admitting: Oncology

## 2023-04-11 ENCOUNTER — Inpatient Hospital Stay

## 2023-04-11 VITALS — BP 147/78 | HR 56 | Temp 98.6°F | Resp 18 | Ht 72.0 in | Wt 247.0 lb

## 2023-04-11 DIAGNOSIS — R718 Other abnormality of red blood cells: Secondary | ICD-10-CM

## 2023-04-11 DIAGNOSIS — E291 Testicular hypofunction: Secondary | ICD-10-CM | POA: Diagnosis not present

## 2023-04-11 DIAGNOSIS — D751 Secondary polycythemia: Secondary | ICD-10-CM | POA: Diagnosis present

## 2023-04-11 DIAGNOSIS — Z79899 Other long term (current) drug therapy: Secondary | ICD-10-CM | POA: Insufficient documentation

## 2023-04-11 LAB — CBC (CANCER CENTER ONLY)
HCT: 52.3 % — ABNORMAL HIGH (ref 39.0–52.0)
Hemoglobin: 16.8 g/dL (ref 13.0–17.0)
MCH: 25.6 pg — ABNORMAL LOW (ref 26.0–34.0)
MCHC: 32.1 g/dL (ref 30.0–36.0)
MCV: 79.6 fL — ABNORMAL LOW (ref 80.0–100.0)
Platelet Count: 150 10*3/uL (ref 150–400)
RBC: 6.57 MIL/uL — ABNORMAL HIGH (ref 4.22–5.81)
RDW: 17.8 % — ABNORMAL HIGH (ref 11.5–15.5)
WBC Count: 5.7 10*3/uL (ref 4.0–10.5)
nRBC: 0 % (ref 0.0–0.2)

## 2023-04-11 LAB — IRON AND TIBC
Iron: 115 ug/dL (ref 45–182)
Saturation Ratios: 29 % (ref 17.9–39.5)
TIBC: 398 ug/dL (ref 250–450)
UIBC: 283 ug/dL

## 2023-04-11 LAB — FERRITIN: Ferritin: 163 ng/mL (ref 24–336)

## 2023-04-11 NOTE — Progress Notes (Signed)
 [facility]  [phone] [phone]  ID: Jacob Lawson OB: 11/02/1955  MR#: [id number]  QIO#:[id number]  Patient Care Team: Mick Sell, MD as PCP - General (Infectious Diseases)  CHIEF COMPLAINT: Secondary polycythemia.  INTERVAL HISTORY: Patient is a 68 year old male who was noted to have an increased hemoglobin on routine blood work.  He reports he takes testosterone regularly.  He currently feels well and is asymptomatic.  He has no neurologic complaints.  He denies any recent fevers or illnesses.  He has a good appetite and denies weight.  He has no chest pain, shortness of breath, cough, or hemoptysis.  He denies any nausea, vomiting, constipation, or diarrhea.  He has no urinary complaints.  Patient offers no specific complaints today.  REVIEW OF SYSTEMS:   Review of Systems  Constitutional: Negative.  Negative for fever, malaise/fatigue and weight loss.  Respiratory: Negative.  Negative for cough and shortness of breath.   Cardiovascular: Negative.  Negative for chest pain and leg swelling.  Gastrointestinal: Negative.  Negative for abdominal pain.  Genitourinary: Negative.  Negative for dysuria.  Musculoskeletal: Negative.  Negative for back pain.  Skin: Negative.  Negative for rash.  Neurological: Negative.  Negative for dizziness, focal weakness, weakness and headaches.  Psychiatric/Behavioral: Negative.  The patient is not nervous/anxious.     As per HPI. Otherwise, a complete review of systems is negative.  PAST MEDICAL HISTORY: Past Medical History:  Diagnosis Date   Acquired hypothyroidism    Allergic rhinitis    Anginal pain (HCC)    Aortic atherosclerosis (HCC)    Carpal tunnel syndrome on both sides    Carpal tunnel syndrome on left    a.) s/p release   Chronic lower back pain    CKD (chronic kidney disease), stage III (HCC)    Colloid LEFT thyroid nodule    Depression    Diastolic dysfunction    a.) TTE  10/19/2020: EF >55%; mild LVH, mile BAE, mild RV enlargement; triv AR/TR/PR, mild MR; G1DD.   Difficult airway for intubation    DOE (dyspnea on exertion)    Generalized anxiety disorder    GERD (gastroesophageal reflux disease)    Hepatic steatosis    Hyperlipidemia    Hypertension    Hypogonadism in male    a.) s/p Testim implant in 2014. b.) uses exogenous TD testosterone   Hypothyroidism    Migraines    Mild mitral regurgitation by prior echocardiogram    Occipital neuralgia    OSA on CPAP    Osteoarthritis    Pulmonary nodules    Renal cyst, left    T2DM (type 2 diabetes mellitus) (HCC)    Vitiligo    Wears hearing aid in both ears     PAST SURGICAL HISTORY: Past Surgical History:  Procedure Laterality Date   APPENDECTOMY     ARTHOSCOPIC ROTAOR CUFF REPAIR Left 11/20/2022   Procedure: Left shoulder open irrigation and debridement, arthroscopic irrigation and debridement and extensive debridment;  Surgeon: Signa Kell, MD;  Location: [facility];  Service: Orthopedics;  Laterality: Left;   CARDIOVERSION N/A 02/18/2023   Procedure: CARDIOVERSION;  Surgeon: Tiajuana Amass, MD;  Location: [facility];  Service: Cardiovascular;  Laterality: N/A;   CARDIOVERSION N/A 03/18/2023   Procedure: CARDIOVERSION;  Surgeon: Alwyn Pea, MD;  Location: [facility];  Service: Cardiovascular;  Laterality: N/A;   CARPAL TUNNEL RELEASE Right 11/2003   CARPAL TUNNEL RELEASE Left 11/19/2017   Procedure: LEFT OPEN CARPAL TUNNEL RELEASE;  Surgeon: Erin Sons, MD;  Location: [facility];  Service: Orthopedics;  Laterality: Left;  Diabetic - oral meds Sleep Apnea   CHONDROPLASTY Right 03/22/2020   Procedure: CHONDROPLASTY;  Surgeon: Christena Flake, MD;  Location: [facility];  Service: Orthopedics;  Laterality: Right;  ABRASION CHONDROPLASTY   COLONOSCOPY     2/91, 6/00, 01/04, 7/08, 10/10, 12/13, 02/26/17   COLONOSCOPY WITH PROPOFOL N/A 03/07/2020   Procedure: COLONOSCOPY WITH PROPOFOL;   Surgeon: Regis Bill, MD;  Location: [facility];  Service: Endoscopy;  Laterality: N/A;   ESOPHAGOGASTRODUODENOSCOPY     2/91, 1/95, 6/00, 1/04, 7/08, 10/10   ESOPHAGOGASTRODUODENOSCOPY  03/07/2020   Procedure: ESOPHAGOGASTRODUODENOSCOPY (EGD);  Surgeon: Regis Bill, MD;  Location: [facility];  Service: Endoscopy;;   EXCISION NEUROMA Left 06/2007   foot  had this done twice   FINGER SURGERY Bilateral    for congenital webbing   GANGLION CYST EXCISION Right 09/23/2019   Procedure: REMOVAL GANGLION OF FLEXOR SHEATH RIGHT LONG FINGER;  Surgeon: Christena Flake, MD;  Location: [facility];  Service: Orthopedics;  Laterality: Right;   IRRIGATION AND DEBRIDEMENT FOOT Left 11/09/2018   Procedure: IRRIGATION AND DEBRIDEMENT FOOT;  Surgeon: Linus Galas, DPM;  Location: [facility];  Service: Podiatry;  Laterality: Left;   KNEE ARTHROSCOPY Right 03/22/2020   Procedure: RIGHT KNEE ARTHROSCOPY WITH PARTIAL MENISCECTOMY;  Surgeon: Christena Flake, MD;  Location: [facility];  Service: Orthopedics;  Laterality: Right;   KNEE ARTHROSCOPY Right 05/25/2021   Procedure: RIGHT KNEE ARTHROSCOPY WITH DEBRIDEMENT AND PARTIAL MEDIAL MENISCECTOMY;  Surgeon: Christena Flake, MD;  Location: [facility];  Service: Orthopedics;  Laterality: Right;   KNEE ARTHROSCOPY WITH MENISCAL REPAIR Right 03/22/2020   Procedure: KNEE ARTHROSCOPY WITH MENISCAL REPAIR;  Surgeon: Christena Flake, MD;  Location: [facility];  Service: Orthopedics;  Laterality: Right;   LASIK Bilateral    MANDIBLE SURGERY     in late 30s, fo overbite   PROSTATE BIOPSY  12/2015   ROTATOR CUFF REPAIR Left 02/2005   SHOULDER ARTHROSCOPY WITH DEBRIDEMENT AND BICEP TENDON REPAIR Left 11/20/2022   Procedure: Left shoulder open irrigation and debridement, arthroscopic irrigation and debridement and extensive debridment;  Surgeon: Signa Kell, MD;  Location: [facility];  Service: Orthopedics;  Laterality: Left;   SHOULDER ARTHROSCOPY WITH SUBACROMIAL DECOMPRESSION AND  OPEN ROTATOR C Right 07/16/2019   Procedure: Right shoulder arthroscopy with debridement, decompression, possible rotator cuff repair, and probable biceps tenodesis;  Surgeon: Christena Flake, MD;  Location: [facility];  Service: Orthopedics;  Laterality: Right;   TEE WITHOUT CARDIOVERSION N/A 02/18/2023   Procedure: TRANSESOPHAGEAL ECHOCARDIOGRAM (TEE);  Surgeon: Tiajuana Amass, MD;  Location: [facility];  Service: Cardiovascular;  Laterality: N/A;   THYROIDECTOMY, PARTIAL     TONSILLECTOMY     and adenoidectomy    FAMILY HISTORY: Family History  Problem Relation Age of Onset   Dementia Mother    Pancreatic cancer Father    Prostate cancer Brother     ADVANCED DIRECTIVES (Y/N):  N  HEALTH MAINTENANCE: Social History   Tobacco Use   Smoking status: Never   Smokeless tobacco: Never  Vaping Use   Vaping status: Never Used  Substance Use Topics   Alcohol use: Yes    Alcohol/week: 1.0 - 2.0 standard drink of alcohol    Types: 1 - 2 Cans of beer per week    Comment: occasional beer   Drug use: No     Colonoscopy:  PAP:  Bone density:  Lipid panel:  Allergies  Allergen Reactions   Lamictal [Lamotrigine] Hives   Pravastatin Other (See Comments)    Muscle joint/pain.   Simvastatin Other (See Comments)    Muscle aches   Statins Other (See Comments)    Muscle/joint aches     Current Outpatient Medications  Medication Sig Dispense Refill   amiodarone (PACERONE) 200 MG tablet Take 200 mg by mouth daily at 12 noon.     apixaban (ELIQUIS) 5 MG TABS tablet Take 5 mg by mouth 2 (two) times daily.     busPIRone (BUSPAR) 7.5 MG tablet Take 7.5 mg by mouth 2 (two) times daily.     carvedilol (COREG) 6.25 MG tablet Take 6.25 mg by mouth 2 (two) times daily with a meal.     Cholecalciferol (VITAMIN D3) 125 MCG (5000 UT) TABS Take 5,000 Units by mouth in the morning.     Cyanocobalamin (VITAMIN B-12 PO) Take 250 mcg by mouth in the morning.     empagliflozin (JARDIANCE) 10 MG TABS  tablet Take 10 mg by mouth in the morning.     esomeprazole (NEXIUM) 20 MG capsule Take 20 mg by mouth daily as needed (acid reflux/indigestion.).     finasteride (PROSCAR) 5 MG tablet TAKE 1 TABLET BY MOUTH EVERY DAY (Patient taking differently: Take 5 mg by mouth every evening.) 90 tablet 3   fluticasone (FLONASE) 50 MCG/ACT nasal spray Place 1-2 sprays into both nostrils daily as needed for allergies.      levothyroxine (SYNTHROID) 175 MCG tablet Take 175 mcg by mouth daily before breakfast.     naproxen sodium (ALEVE) 220 MG tablet Take 440 mg by mouth 2 (two) times daily as needed (pain.).     rosuvastatin (CRESTOR) 5 MG tablet Take 5 mg by mouth every Monday, Wednesday, and Friday. At night     sertraline (ZOLOFT) 100 MG tablet Take 100 mg by mouth in the morning.     tadalafil (CIALIS) 20 MG tablet Take 20 mg by mouth daily as needed for erectile dysfunction.     tamsulosin (FLOMAX) 0.4 MG CAPS capsule Take 1 capsule (0.4 mg total) by mouth daily. 30 capsule 0   valsartan (DIOVAN) 80 MG tablet Take 80 mg by mouth in the morning.     zinc sulfate 220 (50 Zn) MG capsule Take 220 mg by mouth in the morning.     Testosterone Enanthate (XYOSTED) 100 MG/0.5ML SOAJ Inject 0.5 mLs (100 mg total) into the skin every Wednesday. (Patient not taking: Reported on 04/11/2023) 2 mL 1   No current facility-administered medications for this visit.    OBJECTIVE: Vitals:   04/11/23 1452  BP: (!) 147/78  Pulse: (!) 56  Resp: 18  Temp: 98.6 F (37 C)  SpO2: 98%     Body mass index is 33.5 kg/m.    ECOG FS:0 - Asymptomatic  General: Well-developed, well-nourished, no acute distress. Eyes: Pink conjunctiva, anicteric sclera. HEENT: Normocephalic, moist mucous membranes. Lungs: No audible wheezing or coughing. Heart: Regular rate and rhythm. Abdomen: Soft, nontender, no obvious distention. Musculoskeletal: No edema, cyanosis, or clubbing. Neuro: Alert, answering all questions appropriately. Cranial  nerves grossly intact. Skin: No rashes or petechiae noted. Psych: Normal affect. Lymphatics: No cervical, calvicular, axillary or inguinal LAD.   LAB RESULTS:  Lab Results  Component Value Date   NA 137 12/25/2022   K 3.7 12/25/2022   CL 100 12/25/2022   CO2 27 12/25/2022   GLUCOSE 122 (H) 12/25/2022   BUN  26 (H) 12/25/2022   CREATININE 1.47 (H) 12/25/2022   CALCIUM 9.2 12/25/2022   PROT 7.3 12/25/2022   ALBUMIN 4.0 12/25/2022   AST 25 12/25/2022   ALT 26 12/25/2022   ALKPHOS 64 12/25/2022   BILITOT 0.8 12/25/2022   GFRNONAA 52 (L) 12/25/2022   GFRAA >60 07/10/2019    Lab Results  Component Value Date   WBC 5.7 04/11/2023   NEUTROABS 4.1 12/25/2022   HGB 16.8 04/11/2023   HCT 52.3 (H) 04/11/2023   MCV 79.6 (L) 04/11/2023   PLT 150 04/11/2023     STUDIES: No results found.  ASSESSMENT: Secondary polycythemia.  PLAN:    Secondary polycythemia: Likely due to testosterone supplementation.  Patient's hemoglobin is currently within normal limits at 16.8.  All of his other laboratory work including iron stores, erythropoietin level, and JAK2 mutation with reflex are pending at time of dictation.  No intervention is needed at this time.  Return to clinic in 3 weeks to discuss his laboratory results. Low testosterone: Continue testosterone supplementation as per urology.  I spent a total of 45 minutes reviewing chart data, face-to-face evaluation with the patient, counseling and coordination of care as detailed above.   Patient expressed understanding and was in agreement with this plan. He also understands that He can call clinic at any time with any questions, concerns, or complaints.     Jeralyn Ruths, MD   04/11/2023 4:28 PM

## 2023-04-12 LAB — ERYTHROPOIETIN: Erythropoietin: 4.9 m[IU]/mL (ref 2.6–18.5)

## 2023-04-12 LAB — CARBON MONOXIDE, BLOOD (PERFORMED AT REF LAB): Carbon Monoxide, Blood: 2.4 % (ref 0.0–3.6)

## 2023-04-17 ENCOUNTER — Other Ambulatory Visit: Payer: Medicare Other

## 2023-04-17 LAB — JAK2 V617F RFX CALR/MPL/E12-15

## 2023-04-17 LAB — CALR +MPL + E12-E15  (REFLEX)

## 2023-04-23 ENCOUNTER — Inpatient Hospital Stay: Admission: RE | Admit: 2023-04-23 | Source: Ambulatory Visit

## 2023-04-26 ENCOUNTER — Telehealth: Payer: Self-pay

## 2023-04-26 ENCOUNTER — Other Ambulatory Visit: Payer: Self-pay

## 2023-04-26 ENCOUNTER — Other Ambulatory Visit: Payer: Self-pay | Admitting: Urology

## 2023-04-26 ENCOUNTER — Encounter
Admission: RE | Admit: 2023-04-26 | Discharge: 2023-04-26 | Disposition: A | Discharge status: Home / Self Care | Source: Ambulatory Visit | Attending: Orthopedic Surgery | Admitting: Orthopedic Surgery

## 2023-04-26 DIAGNOSIS — Z01812 Encounter for preprocedural laboratory examination: Secondary | ICD-10-CM | POA: Diagnosis present

## 2023-04-26 DIAGNOSIS — E291 Testicular hypofunction: Secondary | ICD-10-CM

## 2023-04-26 DIAGNOSIS — Z01818 Encounter for other preprocedural examination: Secondary | ICD-10-CM

## 2023-04-26 HISTORY — DX: Localized edema: R60.0

## 2023-04-26 HISTORY — DX: Thoracic aortic ectasia: I77.810

## 2023-04-26 HISTORY — DX: Obesity, unspecified: E66.9

## 2023-04-26 HISTORY — DX: Bacteremia: R78.81

## 2023-04-26 HISTORY — DX: Long term (current) use of anticoagulants: Z79.01

## 2023-04-26 HISTORY — DX: Heart failure, unspecified: I50.9

## 2023-04-26 HISTORY — DX: Secondary polycythemia: D75.1

## 2023-04-26 HISTORY — DX: Benign prostatic hyperplasia without lower urinary tract symptoms: N40.0

## 2023-04-26 HISTORY — DX: Abdominal aortic ectasia: I77.811

## 2023-04-26 HISTORY — DX: Unspecified atrial fibrillation: I48.91

## 2023-04-26 LAB — URINALYSIS, ROUTINE W REFLEX MICROSCOPIC
Bacteria, UA: NONE SEEN
Bilirubin Urine: NEGATIVE
Glucose, UA: >=500 mg/dL — AB
Ketones, ur: NEGATIVE mg/dL
Leukocytes,Ua: NEGATIVE
Nitrite: NEGATIVE
Protein, ur: NEGATIVE mg/dL
RBC / HPF: 0 RBC/hpf (ref 0–5)
Specific Gravity, Urine: 1.01 (ref 1.005–1.030)
Squamous Epithelial / HPF: 0 /HPF (ref 0–5)
WBC, UA: 0 WBC/hpf (ref 0–5)
pH: 5 (ref 5.0–8.0)

## 2023-04-26 LAB — SURGICAL PCR SCREEN
MRSA, PCR: NEGATIVE
Staphylococcus aureus: NEGATIVE

## 2023-04-26 NOTE — Patient Instructions (Addendum)
 Your procedure is scheduled on:05-07-23 Tuesday Report to the Registration Desk on the 1st floor of the [facility].Then proceed to the 2nd floor Surgery Desk To find out your arrival time, please call [phone] between 1PM - 3PM on:05-06-23 Monday If your arrival time is 6:00 am, do not arrive before that time as the [facility] entrance doors do not open until 6:00 am.  REMEMBER: Instructions that are not followed completely may result in serious medical risk, up to and including death; or upon the discretion of your surgeon and anesthesiologist your surgery may need to be rescheduled.  Do not eat food after midnight the night before surgery.  No gum chewing or hard candies.  You may however, drink Water up to 2 hours before you are scheduled to arrive for your surgery. Do not drink anything within 2 hours of your scheduled arrival time.  In addition, your doctor has ordered for you to drink the provided:  Gatorade G2 Drinking this carbohydrate drink up to two hours before surgery helps to reduce insulin resistance and improve patient outcomes. Please complete drinking 2 hours before scheduled arrival time.  One week prior to surgery:Last dose will be on 04-29-23  Stop Anti-inflammatories (NSAIDS) such as Advil, Aleve, Ibuprofen, Motrin, Naproxen, Naprosyn and Aspirin based products such as Excedrin, Goody's Powder, BC Powder. Stop ANY OVER THE COUNTER supplements until after surgery (Vitamin D3, Vitamin B12, Zinc)  You may however, continue to take Tylenol if needed for pain up until the day of surgery.  Stop apixaban (ELIQUIS) 3 days prior to surgery-Last dose will be on 05-03-23 Friday(Unless you are told otherwise)  Stop empagliflozin (JARDIANCE) 3 days prior to surgery-Last dose will be on 05-03-23 Friday  Stop tadalafil (CIALIS) 2 days prior to surgery-Last dose will be on 05-04-23 Saturday  Continue taking all of your other prescription medications up until the day of  surgery.  ON THE DAY OF SURGERY ONLY TAKE THESE MEDICATIONS WITH SIPS OF WATER: -amiodarone (PACERONE)  -busPIRone (BUSPAR)  -carvedilol (COREG)  -levothyroxine (SYNTHROID)  -loratadine (CLARITIN)  -sertraline (ZOLOFT)  -tamsulosin (FLOMAX)   No Alcohol for 24 hours before or after surgery.  No Smoking including e-cigarettes for 24 hours before surgery.  No chewable tobacco products for at least 6 hours before surgery.  No nicotine patches on the day of surgery.  Do not use any "recreational" drugs for at least a week (preferably 2 weeks) before your surgery.  Please be advised that the combination of cocaine and anesthesia may have negative outcomes, up to and including death. If you test positive for cocaine, your surgery will be cancelled.  On the morning of surgery brush your teeth with toothpaste and water, you may rinse your mouth with mouthwash if you wish. Do not swallow any toothpaste or mouthwash.  Use CHG Soap as directed on instruction sheet.  Do not wear jewelry, make-up, hairpins, clips or nail polish.  For welded (permanent) jewelry: bracelets, anklets, waist bands, etc.  Please have this removed prior to surgery.  If it is not removed, there is a chance that hospital personnel will need to cut it off on the day of surgery.  Do not wear lotions, powders, or perfumes.   Do not shave body hair from the neck down 48 hours before surgery.  Contact lenses, hearing aids and dentures may not be worn into surgery.  Do not bring valuables to the hospital. [facility] is not responsible for any missing/lost belongings or valuables.  Total Shoulder Arthroplasty:  use Benzoyl Peroxide 5% Gel as directed on instruction sheet.  Bring your C-PAP to the hospital  Notify your doctor if there is any change in your medical condition (cold, fever, infection).  Wear comfortable clothing (specific to your surgery type) to the hospital.  After surgery, you can help prevent lung  complications by doing breathing exercises.  Take deep breaths and cough every 1-2 hours. Your doctor may order a device called an Incentive Spirometer to help you take deep breaths. When coughing or sneezing, hold a pillow firmly against your incision with both hands. This is called "splinting." Doing this helps protect your incision. It also decreases belly discomfort.  If you are being admitted to the hospital overnight, leave your suitcase in the car. After surgery it may be brought to your room.  In case of increased patient census, it may be necessary for you, the patient, to continue your postoperative care in the Same Day Surgery department.  If you are being discharged the day of surgery, you will not be allowed to drive home. You will need a responsible individual to drive you home and stay with you for 24 hours after surgery.   If you are taking public transportation, you will need to have a responsible individual with you.  Please call the Pre-admissions Testing Dept. at [phone] if you have any questions about these instructions.  Surgery Visitation Policy:  Patients having surgery or a procedure may have two visitors.  Children under the age of 8 must have an adult with them who is not the patient.  Inpatient Visitation:    Visiting hours are 7 a.m. to 8 p.m. Up to four visitors are allowed at one time in a patient room. The visitors may rotate out with other people during the day.  One visitor age 41 or older may stay with the patient overnight and must be in the room by 8 p.m.    Pre-operative 5 CHG Bath Instructions   You can play a key role in reducing the risk of infection after surgery. Your skin needs to be as free of germs as possible. You can reduce the number of germs on your skin by washing with CHG (chlorhexidine gluconate) soap before surgery. CHG is an antiseptic soap that kills germs and continues to kill germs even after washing.   DO NOT use if  you have an allergy to chlorhexidine/CHG or antibacterial soaps. If your skin becomes reddened or irritated, stop using the CHG and notify one of our RNs at [phone].   Please shower with the CHG soap starting 4 days before surgery using the following schedule:     Please keep in mind the following:  DO NOT shave, including legs and underarms, starting the day of your first shower.   You may shave your face at any point before/day of surgery.  Place clean sheets on your bed the day you start using CHG soap. Use a clean washcloth (not used since being washed) for each shower. DO NOT sleep with pets once you start using the CHG.   CHG Shower Instructions:  If you choose to wash your hair and private area, wash first with your normal shampoo/soap.  After you use shampoo/soap, rinse your hair and body thoroughly to remove shampoo/soap residue.  Turn the water OFF and apply about 3 tablespoons (45 ml) of CHG soap to a CLEAN washcloth.  Apply CHG soap ONLY FROM YOUR NECK DOWN TO YOUR TOES (washing  for 3-5 minutes)  DO NOT use CHG soap on face, private areas, open wounds, or sores.  Pay special attention to the area where your surgery is being performed.  If you are having back surgery, having someone wash your back for you may be helpful. Wait 2 minutes after CHG soap is applied, then you may rinse off the CHG soap.  Pat dry with a clean towel  Put on clean clothes/pajamas   If you choose to wear lotion, please use ONLY the CHG-compatible lotions on the back of this paper.     Additional instructions for the day of surgery: DO NOT APPLY any lotions, deodorants, cologne, or perfumes.   Put on clean/comfortable clothes.  Brush your teeth.  Ask your nurse before applying any prescription medications to the skin.      CHG Compatible Lotions   Aveeno Moisturizing lotion  Cetaphil Moisturizing Cream  Cetaphil Moisturizing Lotion  Clairol Herbal Essence Moisturizing Lotion, Dry Skin   Clairol Herbal Essence Moisturizing Lotion, Extra Dry Skin  Clairol Herbal Essence Moisturizing Lotion, Normal Skin  Curel Age Defying Therapeutic Moisturizing Lotion with Alpha Hydroxy  Curel Extreme Care Body Lotion  Curel Soothing Hands Moisturizing Hand Lotion  Curel Therapeutic Moisturizing Cream, Fragrance-Free  Curel Therapeutic Moisturizing Lotion, Fragrance-Free  Curel Therapeutic Moisturizing Lotion, Original Formula  Eucerin Daily Replenishing Lotion  Eucerin Dry Skin Therapy Plus Alpha Hydroxy Crme  Eucerin Dry Skin Therapy Plus Alpha Hydroxy Lotion  Eucerin Original Crme  Eucerin Original Lotion  Eucerin Plus Crme Eucerin Plus Lotion  Eucerin TriLipid Replenishing Lotion  Keri Anti-Bacterial Hand Lotion  Keri Deep Conditioning Original Lotion Dry Skin Formula Softly Scented  Keri Deep Conditioning Original Lotion, Fragrance Free Sensitive Skin Formula  Keri Lotion Fast Absorbing Fragrance Free Sensitive Skin Formula  Keri Lotion Fast Absorbing Softly Scented Dry Skin Formula  Keri Original Lotion  Keri Skin Renewal Lotion Keri Silky Smooth Lotion  Keri Silky Smooth Sensitive Skin Lotion  Nivea Body Creamy Conditioning Oil  Nivea Body Extra Enriched Lotion  Nivea Body Original Lotion  Nivea Body Sheer Moisturizing Lotion Nivea Crme  Nivea Skin Firming Lotion  NutraDerm 30 Skin Lotion  NutraDerm Skin Lotion  NutraDerm Therapeutic Skin Cream  NutraDerm Therapeutic Skin Lotion  ProShield Protective Hand Cream  Provon moisturizing lotion  How to Use an Incentive Spirometer An incentive spirometer is a tool that measures how well you are filling your lungs with each breath. Learning to take long, deep breaths using this tool can help you keep your lungs clear and active. This may help to reverse or lessen your chance of developing breathing (pulmonary) problems, especially infection. You may be asked to use a spirometer: After a surgery. If you have a lung problem or  a history of smoking. After a long period of time when you have been unable to move or be active. If the spirometer includes an indicator to show the highest number that you have reached, your health care provider or respiratory therapist will help you set a goal. Keep a log of your progress as told by your health care provider. What are the risks? Breathing too quickly may cause dizziness or cause you to pass out. Take your time so you do not get dizzy or light-headed. If you are in pain, you may need to take pain medicine before doing incentive spirometry. It is harder to take a deep breath if you are having pain. How to use your incentive spirometer  Sit up on the  edge of your bed or on a chair. Hold the incentive spirometer so that it is in an upright position. Before you use the spirometer, breathe out normally. Place the mouthpiece in your mouth. Make sure your lips are closed tightly around it. Breathe in slowly and as deeply as you can through your mouth, causing the piston or the ball to rise toward the top of the chamber. Hold your breath for 3-5 seconds, or for as long as possible. If the spirometer includes a coach indicator, use this to guide you in breathing. Slow down your breathing if the indicator goes above the marked areas. Remove the mouthpiece from your mouth and breathe out normally. The piston or ball will return to the bottom of the chamber. Rest for a few seconds, then repeat the steps 10 or more times. Take your time and take a few normal breaths between deep breaths so that you do not get dizzy or light-headed. Do this every 1-2 hours when you are awake. If the spirometer includes a goal marker to show the highest number you have reached (best effort), use this as a goal to work toward during each repetition. After each set of 10 deep breaths, cough a few times. This will help to make sure that your lungs are clear. If you have an incision on your chest or abdomen from  surgery, place a pillow or a rolled-up towel firmly against the incision when you cough. This can help to reduce pain while taking deep breaths and coughing. General tips When you are able to get out of bed: Walk around often. Continue to take deep breaths and cough in order to clear your lungs. Keep using the incentive spirometer until your health care provider says it is okay to stop using it. If you have been in the hospital, you may be told to keep using the spirometer at home. Contact a health care provider if: You are having difficulty using the spirometer. You have trouble using the spirometer as often as instructed. Your pain medicine is not giving enough relief for you to use the spirometer as told. You have a fever. Get help right away if: You develop shortness of breath. You develop a cough with bloody mucus from the lungs. You have fluid or blood coming from an incision site after you cough. Summary An incentive spirometer is a tool that can help you learn to take long, deep breaths to keep your lungs clear and active. You may be asked to use a spirometer after a surgery, if you have a lung problem or a history of smoking, or if you have been inactive for a long period of time. Use your incentive spirometer as instructed every 1-2 hours while you are awake. If you have an incision on your chest or abdomen, place a pillow or a rolled-up towel firmly against your incision when you cough. This will help to reduce pain. Get help right away if you have shortness of breath, you cough up bloody mucus, or blood comes from your incision when you cough. This information is not intended to replace advice given to you by your health care provider. Make sure you discuss any questions you have with your health care provider. Preparing for Total Shoulder Arthroplasty  Before surgery, you can play an important role by reducing the number of germs on your skin by using the following  products:  Benzoyl Peroxide Gel  o Reduces the number of germs present on the skin  o  Applied twice a day to shoulder area starting two days before surgery  Chlorhexidine Gluconate (CHG) Soap  o An antiseptic cleaner that kills germs and bonds with the skin to continue killing germs even after washing  o Used for showering the night before surgery and morning of surgery  BENZOYL PEROXIDE 5% GEL  Please do not use if you have an allergy to benzoyl peroxide. If your skin becomes reddened/irritated stop using the benzoyl peroxide.  Starting two days before surgery, apply as follows:  1. Apply benzoyl peroxide in the morning and at night. Apply after taking a shower. If you are not taking a shower, clean entire shoulder front, back, and side along with the armpit with a clean wet washcloth.  2. Place a quarter-sized dollop on your shoulder and rub in thoroughly, making sure to cover the front, back, and side of your shoulder, along with the armpit.  2 days before ____ AM ____ PM 1 day before ____ AM ____ PM  3. Do this twice a day for two days. (Last application is the night before surgery, AFTER using the CHG soap).  4. Do NOT apply benzoyl peroxide gel on the day of surgery.  Preoperative Educational Videos for Total Hip, Knee and Shoulder Replacements  To better prepare for surgery, please view our videos that explain the physical activity and discharge planning required to have the best surgical recovery at [facility].  [email or web address]  Questions? Call [phone] or email [email or web address]

## 2023-04-26 NOTE — Telephone Encounter (Signed)
 Spoke with patient. His pain has improved with taking Tamsulosin. Is it ok to continue at this time?

## 2023-04-26 NOTE — Telephone Encounter (Signed)
 Patient would like to know if he can go back on Xyosted injections? He would need a refill sent to the pharmacy. He went to hematologist and states those notes were suppose to be sent to [place] to review. He has had very low energy since stopping these injections now.

## 2023-04-28 ENCOUNTER — Other Ambulatory Visit: Payer: Self-pay | Admitting: Urology

## 2023-04-29 MED ORDER — XYOSTED 100 MG/0.5ML ~~LOC~~ SOAJ: 100.0000 mg | SUBCUTANEOUS | 2 refills | Status: DC

## 2023-04-29 NOTE — Addendum Note (Signed)
 Addended by: Riki Altes on: 04/29/2023 04:11 PM   Modules accepted: Orders

## 2023-04-29 NOTE — Telephone Encounter (Signed)
 Advised patient and made follow up appt.

## 2023-04-30 ENCOUNTER — Encounter: Payer: Self-pay | Admitting: Orthopedic Surgery

## 2023-04-30 NOTE — Progress Notes (Signed)
 Perioperative / Anesthesia Services  Pre-Admission Testing Clinical Review / Pre-Operative Anesthesia Consult  Date: 05/03/23  Patient Demographics:  Name: Jacob Lawson DOB: [birth year] MRN:   [id number]  Planned Surgical Procedure(s):    Case: [id number] Date/Time: 05/07/23 0715   Procedure: ARTHROPLASTY, SHOULDER, TOTAL, REVERSE (Left: Shoulder) - Left reverse shoulder arthroplasty, biceps tenodesis   Anesthesia type: Choice   Diagnosis: Complete tear of left rotator cuff, unspecified whether traumatic [M75.122]   Pre-op diagnosis: Complete tear of left rotator cuff, unspecified whether traumatic   Location: [facility] OR ROOM 01 / [facility]   Surgeons: Signa Kell, MD      NOTE: Available PAT nursing documentation and vital signs have been reviewed. Clinical nursing staff has updated patient's PMH/PSHx, current medication list, and drug allergies/intolerances to ensure comprehensive history available to assist in medical decision making as it pertains to the aforementioned surgical procedure and anticipated anesthetic course. Extensive review of available clinical information personally performed. [place] PMH and PSHx updated with any diagnoses/procedures that  may have been inadvertently omitted during his intake with the pre-admission testing department's nursing staff.  Clinical Discussion:  Jacob Lawson is a 68 y.o. male who is submitted for pre-surgical anesthesia review and clearance prior to him undergoing the above procedure. Patient has never been a smoker in the past. Pertinent PMH includes: atrial fibrillation, HFpEF, aortic ectasia, HTN, HLD, T2DM, hypothyroidism, CKD-III, DOE, OSA (on nocturnal PAP therapy), GERD (on daily PPI), pulmonary edema, secondary erythrocytosis,  Patient is followed by cardiology Juliann Pares, MD). He was last seen in the cardiology clinic on 02/01/2023; notes reviewed. At the time of his clinic visit, patient complaining of pain in  his LEFT shoulder, shortness of breath, palpitations, and fatigue. He denied chest pain, PND, orthopnea, significant peripheral edema, weakness,vertiginous symptoms, or presyncope/syncope. Patient with a past medical history significant for cardiovascular diagnoses. Documented physical exam was grossly benign, providing no evidence of acute exacerbation and/or decompensation of the patient's known cardiovascular conditions.  Patient with an atrial fibrillation diagnosis.    He underwent DCCV procedure on 03/17/2021, at which time he received a single 120 J synchronized cardioversion converting him to normal sinus rhythm.  Due to refractory atrial arrhythmia, patient underwent TEE and attempted cardioversion on 02/18/2023.  During the course of the procedure, patient became apneic and hypoxic with SpO2 levels in the 60s.  Patient required bag mask ventilation to stabilize oxygen levels.  Once stabilized, procedure was reattempted with similar result.  Procedure ultimately aborted due to large body habitus, high sedation requirements, severe procedural hypertension, and severe untreated OSAH.  Patient underwent repeat DCCV procedure on 03/18/2023, again receiving a single 120 J synchronized cardioversion converting him to normal sinus rhythm.  Coronary CTA was performed on 11/09/2021 that demonstrated an Agatston coronary artery calcium score of 422. This placed patient in the 55 percentile for age, sex, and race matched controls. Calcium depositions noted to be mild in the proximal RCA and D1 territories (25-49%).  Additionally, patient with minimal LCx and proximal LAD stenosis (<25%). Study demonstrates normal coronary origin with RIGHT dominance.  Most recent TTE was performed on 05/29/2022 revealing a normal left ventricular systolic function with EF of >55%.  There was mild LVH. Left ventricular diastolic Doppler parameters consistent with abnormal relaxation (G1DD).  GLS -17.2%.  Mild biatrial  enlargement noted.  Right ventricle was also noted to be enlarged with normal systolic function.  There was mild mitral annular calcification.  Trivial to mild pan  valvular regurgitation was also noted.  RVSP 27.3 mmHg.  All transvalvular gradients were noted to be normal providing no evidence suggestive of valvular stenosis.  Aortic root mildly dilated measuring 3.9 cm.  Cardiac MRI was performed on 03/25/2023 revealing a normal left ventricular cavity size.  Systolic function was globally normal with a calculated LVEF of 68%.  There was mild to moderate biatrial enlargement.  Aortic root was mildly enlarged measuring 4.0 cm in maximum diameter there was mild to moderate aortic, in addition to trivial tricuspid, and mild pulmonary valve regurgitation.  Delayed enhancement imaging demonstrated no evidence of myocardial infarction, scar, or infiltrative disease.  No intracardiac thrombus was observed.  Patient with an atrial fibrillation diagnosis; CHA2DS2-VASc Score = 5 (age, HFpEF, HTN, vascular disease, T2DM).currently, cardiac rate and rhythm are being maintained on oral amiodarone + carvedilol.  Patient is chronically anticoagulated using standard dose apixaban.  Patient is reported be compliant with therapy with no evidence or reports of GI/GU related bleeding.  Due to patient's refractory atrial arrhythmia despite multiple cardioversions, patient to be referred to electrophysiology for discussions regarding possible cardiac ablation. Blood pressure reasonably controlled at 146/88 mmHg on prescribed beta-blocker (carvedilol) and ARB (valsartan) therapies.  Patient is on rosuvastatin for his HLD diagnosis and further ASCVD prevention.  T2DM well-controlled on currently prescribed regimen; last HgbA1c was 03/07/2023. In the setting of known cardiovascular diagnoses and concurrent T2DM, patient is taking an SGLT2i (empagliflozin) for added cardiovascular and renovascular protection.  Additionally, given known  cardiovascular diagnoses, it is important note that patient is on both exogenous testosterone injections (testosterone enanthate) and a PDE5i medication (tadalafil) for and erectile dysfunction diagnosis. Patient is able to complete all of his  ADL/IADLs without cardiovascular limitation.  Per the DASI, patient is able to achieve at least 4 METS of physical activity without experiencing any significant degree of angina/anginal equivalent symptoms.  No changes were made to his medication regimen during his visit with cardiology.  Patient scheduled to follow-up with outpatient cardiology in 6 months or sooner if needed.  Orion Modest is scheduled for an elective ARTHROPLASTY, SHOULDER, TOTAL, REVERSE (Left: Shoulder) on 05/07/2023 with Dr. Signa Kell, MD.  Given patient's past medical history significant for cardiovascular diagnoses, presurgical cardiac clearance was sought by the PAT team. Per cardiology, "this patient is optimized for surgery and may proceed with the planned procedural course with a ACCEPTABLE risk of significant perioperative cardiovascular complications".  Again, this patient is on daily oral anticoagulation therapy using a DOAC medication.  He has been instructed on recommendations for holding his apixaban for 3 days prior to his procedure with plans to restart as soon as postoperative bleeding risk felt to be minimized by his attending surgeon. The patient has been instructed that his last dose of apixaban should be on 05/03/2023.  Patient reports previous perioperative complications with anesthesia in the past. Patient was determined to have a (+) difficult airway back in 10/2022 due to small oral orifice, macroglossia, and "crowded redundant tissue". In review his EMR, it is noted that patient underwent a general anesthetic course here at [facility] ([facility]) in 02/2023 without documented complications.      04/26/2023    2:44 PM 04/11/2023    2:52  PM 04/03/2023   11:03 AM  Vitals with BMI  Height 6\' 0"  6\' 0"  6\' 0"   Weight 248 lbs 14 oz 247 lbs 245 lbs  BMI 33.75 33.49 33.22  Systolic 150 147 409  Diastolic 90 78 84  Pulse 51 56 59   Providers/Specialists:  NOTE: Primary physician provider listed below. Patient may have been seen by APP or partner within same practice.   PROVIDER ROLE / SPECIALTY LAST Sarina Ser, MD Orthopedics (Surgeon) 04/18/2023  Mick Sell, MD Primary Care Provider 03/07/2023  Rudean Hitt, MD Cardiology 02/01/2023   Allergies:   Allergies  Allergen Reactions   Lamictal [Lamotrigine] Hives   Pravastatin Other (See Comments)    Muscle joint/pain.   Simvastatin Other (See Comments)    Muscle aches   Statins Other (See Comments)    Muscle/joint aches    Current Home Medications:   No current facility-administered medications for this encounter.    amiodarone (PACERONE) 200 MG tablet   apixaban (ELIQUIS) 5 MG TABS tablet   busPIRone (BUSPAR) 7.5 MG tablet   carvedilol (COREG) 6.25 MG tablet   Cholecalciferol (VITAMIN D3) 125 MCG (5000 UT) TABS   Cyanocobalamin (VITAMIN B-12 PO)   empagliflozin (JARDIANCE) 10 MG TABS tablet   esomeprazole (NEXIUM) 20 MG capsule   fluticasone (FLONASE) 50 MCG/ACT nasal spray   levothyroxine (SYNTHROID) 175 MCG tablet   loratadine (CLARITIN) 10 MG tablet   naproxen sodium (ALEVE) 220 MG tablet   rosuvastatin (CRESTOR) 5 MG tablet   sertraline (ZOLOFT) 100 MG tablet   tadalafil (CIALIS) 20 MG tablet   valsartan (DIOVAN) 80 MG tablet   zinc sulfate 220 (50 Zn) MG capsule   finasteride (PROSCAR) 5 MG tablet   tamsulosin (FLOMAX) 0.4 MG CAPS capsule   Testosterone Enanthate (XYOSTED) 100 MG/0.5ML SOAJ   History:   Past Medical History:  Diagnosis Date   (HFpEF) heart failure with preserved ejection fraction (HCC)    Acquired hypothyroidism    Allergic rhinitis    Anginal pain (HCC)    Anticoagulated on apixaban    Aortic  atherosclerosis (HCC)    Aortic root dilation (HCC)    Atrial fibrillation (HCC)    a.) CHA2DS2VASc = 5 (age, HFpEF, HTN, vascular disease, T2DM) as of 05/01/2023; b.) s/p DCCV 03/17/2021 --> 120 J x 1 --> NSR; c.) failed TEE with cardioversion 02/18/2023 --> became apneic with desat to 60s --> procedure aborted; d.) s/p DCCV 03/18/2023 --> 120 J x 1 -->; e.) c.) rate/rhythm maintained on oral amiodarone + carvedilol; chronically anticoagulated with apixaban   Bacteremia due to Gram-negative bacteria 11/14/2018   a.) setting of puncture wound to LEFT foot   Bilateral leg edema    BPH (benign prostatic hyperplasia)    Carpal tunnel syndrome on both sides    Chronic lower back pain    CKD (chronic kidney disease), stage III (HCC)    Colloid LEFT thyroid nodule    Depression    Difficult airway for intubation 11/20/2022   a.) Grade I view with McGrath X3 blade; small mouth opening, large tongue, crowded redundant tissue   DOE (dyspnea on exertion)    Ectatic abdominal aorta (HCC)    Erectile dysfunction    a.) on PDE5i (tadalafil)   Generalized anxiety disorder    GERD (gastroesophageal reflux disease)    Hepatic steatosis    Hyperlipidemia    Hypertension    Hypogonadism in male    a.) s/p Testim implant in 2014. b.) uses exogenous testosterone   Hypothyroidism    Long term current use of amiodarone    Migraines    Obesity    Occipital neuralgia    OSA on CPAP    Osteoarthritis  Pulmonary nodules    Renal cyst, left    Right rotator cuff tear    Secondary polycythemia    a.) OSAH + exogenous testosterone use + obesity   T2DM (type 2 diabetes mellitus) (HCC)    Vitiligo    Wears hearing aid in both ears    Past Surgical History:  Procedure Laterality Date   APPENDECTOMY     ARTHOSCOPIC ROTAOR CUFF REPAIR Left 11/20/2022   Procedure: Left shoulder open irrigation and debridement, arthroscopic irrigation and debridement and extensive debridment;  Surgeon: Signa Kell, MD;   Location: [facility] ORS;  Service: Orthopedics;  Laterality: Left;   CARDIOVERSION N/A 02/18/2023   Procedure: CARDIOVERSION;  Surgeon: Tiajuana Amass, MD;  Location: [facility];  Service: Cardiovascular;  Laterality: N/A;   CARDIOVERSION N/A 03/18/2023   Procedure: CARDIOVERSION;  Surgeon: Alwyn Pea, MD;  Location: [facility];  Service: Cardiovascular;  Laterality: N/A;   CARPAL TUNNEL RELEASE Right 11/2003   CARPAL TUNNEL RELEASE Left 11/19/2017   Procedure: LEFT OPEN CARPAL TUNNEL RELEASE;  Surgeon: Erin Sons, MD;  Location: [facility];  Service: Orthopedics;  Laterality: Left;  Diabetic - oral meds Sleep Apnea   CHONDROPLASTY Right 03/22/2020   Procedure: CHONDROPLASTY;  Surgeon: Christena Flake, MD;  Location: [facility] OR[facility];  Service: Orthopedics;  Laterality: Right;  ABRASION CHONDROPLASTY   COLONOSCOPY     2/91, 6/00, 01/04, 7/08, 10/10, 12/13, 02/26/17   COLONOSCOPY WITH PROPOFOL N/A 03/07/2020   Procedure: COLONOSCOPY WITH PROPOFOL;  Surgeon: Regis Bill, MD;  Location: [facility] END[facility];  Service: Endoscopy;  Laterality: N/A;   ESOPHAGOGASTRODUODENOSCOPY     2/91, 1/95, 6/00, 1/04, 7/08, 10/10   ESOPHAGOGASTRODUODENOSCOPY  03/07/2020   Procedure: ESOPHAGOGASTRODUODENOSCOPY (EGD);  Surgeon: Regis Bill, MD;  Location: [facility] ENDOSCOPY;  Service: Endoscopy;;   EXCISION NEUROMA Left 06/2007   foot  had this done twice   FINGER SURGERY Bilateral    for congenital webbing   GANGLION CYST EXCISION Right 09/23/2019   Procedure: REMOVAL GANGLION OF FLEXOR SHEATH RIGHT LONG FINGER;  Surgeon: Christena Flake, MD;  Location: [facility];  Service: Orthopedics;  Laterality: Right;   IRRIGATION AND DEBRIDEMENT FOOT Left 11/09/2018   Procedure: IRRIGATION AND DEBRIDEMENT FOOT;  Surgeon: Linus Galas, DPM;  Location: [facility];  Service: Podiatry;  Laterality: Left;   KNEE ARTHROSCOPY Right 03/22/2020   Procedure: RIGHT KNEE ARTHROSCOPY WITH PARTIAL MENISCECTOMY;  Surgeon: Christena Flake, MD;  Location: [facility];  Service: Orthopedics;  Laterality: Right;   KNEE ARTHROSCOPY Right 05/25/2021   Procedure: RIGHT KNEE ARTHROSCOPY WITH DEBRIDEMENT AND PARTIAL MEDIAL MENISCECTOMY;  Surgeon: Christena Flake, MD;  Location: [facility];  Service: Orthopedics;  Laterality: Right;   KNEE ARTHROSCOPY WITH MENISCAL REPAIR Right 03/22/2020   Procedure: KNEE ARTHROSCOPY WITH MENISCAL REPAIR;  Surgeon: Christena Flake, MD;  Location: [facility];  Service: Orthopedics;  Laterality: Right;   LASIK Bilateral    MANDIBLE SURGERY     in late 30s, fo overbite   PROSTATE BIOPSY  12/2015   ROTATOR CUFF REPAIR Left 02/2005   SHOULDER ARTHROSCOPY WITH DEBRIDEMENT AND BICEP TENDON REPAIR Left 11/20/2022   Procedure: Left shoulder open irrigation and debridement, arthroscopic irrigation and debridement and extensive debridment;  Surgeon: Signa Kell, MD;  Location: [facility] OR[facility];  Service: Orthopedics;  Laterality: Left;   SHOULDER ARTHROSCOPY WITH SUBACROMIAL DECOMPRESSION AND OPEN ROTATOR C Right 07/16/2019   Procedure: Right shoulder arthroscopy with debridement, decompression, possible rotator cuff repair, and probable biceps tenodesis;  Surgeon: Christena Flake, MD;  Location: [facility] ORS;  Service: Orthopedics;  Laterality: Right;   TEE WITHOUT CARDIOVERSION N/A 02/18/2023   Procedure: TRANSESOPHAGEAL ECHOCARDIOGRAM (TEE);  Surgeon: Tiajuana Amass, MD;  Location: [facility] ORS;  Service: Cardiovascular;  Laterality: N/A;   THYROIDECTOMY, PARTIAL     TONSILLECTOMY AND ADENOIDECTOMY     Family History  Problem Relation Age of Onset   Dementia Mother    Pancreatic cancer Father    Prostate cancer Brother    Social History   Tobacco Use   Smoking status: Never   Smokeless tobacco: Never  Substance Use Topics   Alcohol use: Yes    Alcohol/week: 1.0 - 2.0 standard drink of alcohol    Types: 1 - 2 Cans of beer per week    Comment: occasional beer   Pertinent Clinical Results:  LABS:  Lab Results   Component Value Date   WBC 5.7 04/11/2023   HGB 16.8 04/11/2023   HCT 52.3 (H) 04/11/2023   MCV 79.6 (L) 04/11/2023   PLT 150 04/11/2023   Lab Results  Component Value Date   NA 141 03/07/2023   CL 104 03/07/2023   K 4.0 03/07/2023   CO2 24.6 03/07/2023   BUN 26 (H) 03/07/2023   CREATININE 1.4 (H) 03/07/2023   GFRNONAA 55 (L) 03/07/2023   CALCIUM 9.3 03/07/2023   ALBUMIN 4.5 03/07/2023   GLUCOSE 106 03/07/2023    ECG: Date: 03/18/2023  Time ECG obtained: 0742 AM Rate: 56 bpm Rhythm: sinus bradycardia Axis (leads I and aVF): normal Intervals: PR 205 ms. QRS 110 ms. QTc 477 ms. ST segment and T wave changes: Abnormal R-wave progression; early transition Evidence of a possible, age undetermined, prior infarct:  No Comparison: Tracing obtained on 02/18/2023 showed atrial fibrillation at a rate of 59 bpm   IMAGING / PROCEDURES: CARDIAC MRI HEART MORPHOLOGY AND FUNCTION WITH AND WITHOUT CONTRAST performed on 03/25/2023 The left ventricle is normal in cavity size. There is moderate concentric hypertrophy (max 1.6 cm). Systolic function is normal globally.  The calculated LV ejection fraction is 68%.  The right ventricle is normal in cavity size, wall thickness, and systolic function.  Both atria are mild-moderately enlarged.  The aortic root is mildly enlarged (max 4.0 cm). The aortic valve is trileaflet in morphology with normal opening. There is a central jet of mild-mod aortic valve regurgitation. The regurgitant orifice is 0.1 cm2, with a regurgitant volume of 10-15 ml and a regurgitant fraction of also between 10-15%.  There is trivial tricuspid and mild pulmonic valve regurgitation.   Delayed enhancement imaging demonstrates no evidence of myocardial infarction, scar or infiltrative disease.  No intracardiac thrombus are seen.  The pericardium is normal in thickness.  There is no pericardial effusion seen.   TRANSTHORACIC ECHOCARDIOGRAM performed on 05/29/2022 Normal  left ventricular systolic function with an EF of >55% Mild LVH Left ventricular diastolic Doppler parameters consistent with abnormal relaxation (G1DD). Right ventricle mildly enlarged Normal right ventricular systolic function Mild biatrial enlargement Mild mitral annular calcification Trivial PR Mild AR, MR, TR RVSP = 27.3 mmHg Normal transvalvular gradients; no valvular stenosis No pericardial effusion Mildly dilated root measuring 3.9 cm  CT CORONARY MORPH W/CTA COR W/SCORE W/CA W/CM &/OR WO/CM performed on 11/09/2021 Coronary calcium score of 422. This was 78th percentile for age and sex matched control. Normal coronary origin with right dominance. Mild proximal RCA and D1 stenosis (25-49%). Minimal LCX and proximal LAD stenosis (<25%). CAD-RADS 2. Mild non-obstructive CAD (25-49%).  Consider non-atherosclerotic causes of chest pain. Consider preventive therapy and risk factor modification. Aortic atherosclerosis    Impression and Plan:  MAL ASHER has been referred for pre-anesthesia review and clearance prior to him undergoing the planned anesthetic and procedural courses. Available labs, pertinent testing, and imaging results were personally reviewed by me in preparation for upcoming operative/procedural course. [facility] medical record has been updated following extensive record review and patient interview with PAT staff.   This patient has been appropriately cleared by cardiology with an overall ACCEPTABLE risk of experiencing significant perioperative cardiovascular complications. Based on clinical review performed today (05/03/23), barring any significant acute changes in the patient's overall condition, it is anticipated that he will be able to proceed with the planned surgical intervention. Any acute changes in clinical condition may necessitate his procedure being postponed and/or cancelled. Patient will meet with anesthesia team (MD and/or CRNA) on the day of his  procedure for preoperative evaluation/assessment. Questions regarding anesthetic course will be fielded at that time.   Pre-surgical instructions were reviewed with the patient during his PAT appointment, and questions were fielded to satisfaction by PAT clinical staff. He has been instructed on which medications that he will need to hold prior to surgery, as well as the ones that have been deemed safe/appropriate to take on the day of his procedure. As part of the general education provided by PAT, patient made aware both verbally and in writing, that he would need to abstain from the use of any illegal substances during his perioperative course. He was advised that failure to follow the provided instructions could necessitate case cancellation or result in serious perioperative complications up to and including death. Patient encouraged to contact PAT and/or his surgeon's office to discuss any questions or concerns that may arise prior to surgery; verbalized understanding.   Quentin Mulling, MSN, APRN, FNP-C, CEN [facility]  Perioperative Services Nurse Practitioner Phone: [phone] Fax: [phone] 05/03/23 1:20 PM  NOTE: This note has been prepared using Dragon dictation software. Despite my best ability to proofread, there is always the potential that unintentional transcriptional errors may still occur from this process.

## 2023-04-30 NOTE — Telephone Encounter (Signed)
 Rx sent

## 2023-05-01 ENCOUNTER — Encounter: Payer: Self-pay | Admitting: Orthopedic Surgery

## 2023-05-02 ENCOUNTER — Encounter: Payer: Self-pay | Admitting: Orthopedic Surgery

## 2023-05-02 ENCOUNTER — Encounter

## 2023-05-02 ENCOUNTER — Ambulatory Visit: Admitting: Oncology

## 2023-05-07 ENCOUNTER — Ambulatory Visit

## 2023-05-07 ENCOUNTER — Ambulatory Visit: Payer: Self-pay | Admitting: Urgent Care

## 2023-05-07 ENCOUNTER — Ambulatory Visit
Admission: RE | Admit: 2023-05-07 | Discharge: 2023-05-07 | Disposition: A | Discharge status: Home / Self Care | Source: Ambulatory Visit | Attending: Orthopedic Surgery | Admitting: Orthopedic Surgery

## 2023-05-07 ENCOUNTER — Encounter: Payer: Self-pay | Admitting: Orthopedic Surgery

## 2023-05-07 ENCOUNTER — Other Ambulatory Visit: Payer: Self-pay

## 2023-05-07 ENCOUNTER — Encounter
Admission: RE | Disposition: A | Discharge status: Home / Self Care | Payer: Self-pay | Source: Ambulatory Visit | Attending: Orthopedic Surgery

## 2023-05-07 DIAGNOSIS — Z7901 Long term (current) use of anticoagulants: Secondary | ICD-10-CM | POA: Diagnosis not present

## 2023-05-07 DIAGNOSIS — M7522 Bicipital tendinitis, left shoulder: Secondary | ICD-10-CM | POA: Diagnosis not present

## 2023-05-07 DIAGNOSIS — Z01818 Encounter for other preprocedural examination: Secondary | ICD-10-CM

## 2023-05-07 DIAGNOSIS — M75122 Complete rotator cuff tear or rupture of left shoulder, not specified as traumatic: Secondary | ICD-10-CM | POA: Insufficient documentation

## 2023-05-07 DIAGNOSIS — F419 Anxiety disorder, unspecified: Secondary | ICD-10-CM | POA: Insufficient documentation

## 2023-05-07 DIAGNOSIS — F32A Depression, unspecified: Secondary | ICD-10-CM | POA: Diagnosis not present

## 2023-05-07 DIAGNOSIS — E039 Hypothyroidism, unspecified: Secondary | ICD-10-CM | POA: Diagnosis not present

## 2023-05-07 DIAGNOSIS — N183 Chronic kidney disease, stage 3 unspecified: Secondary | ICD-10-CM | POA: Insufficient documentation

## 2023-05-07 DIAGNOSIS — Z6833 Body mass index (BMI) 33.0-33.9, adult: Secondary | ICD-10-CM | POA: Diagnosis not present

## 2023-05-07 DIAGNOSIS — G4733 Obstructive sleep apnea (adult) (pediatric): Secondary | ICD-10-CM | POA: Diagnosis not present

## 2023-05-07 DIAGNOSIS — I4891 Unspecified atrial fibrillation: Secondary | ICD-10-CM | POA: Diagnosis not present

## 2023-05-07 DIAGNOSIS — M1990 Unspecified osteoarthritis, unspecified site: Secondary | ICD-10-CM | POA: Diagnosis not present

## 2023-05-07 DIAGNOSIS — K219 Gastro-esophageal reflux disease without esophagitis: Secondary | ICD-10-CM | POA: Insufficient documentation

## 2023-05-07 DIAGNOSIS — E1122 Type 2 diabetes mellitus with diabetic chronic kidney disease: Secondary | ICD-10-CM | POA: Diagnosis not present

## 2023-05-07 DIAGNOSIS — Z79890 Hormone replacement therapy (postmenopausal): Secondary | ICD-10-CM | POA: Diagnosis not present

## 2023-05-07 DIAGNOSIS — I209 Angina pectoris, unspecified: Secondary | ICD-10-CM | POA: Insufficient documentation

## 2023-05-07 DIAGNOSIS — E669 Obesity, unspecified: Secondary | ICD-10-CM | POA: Diagnosis not present

## 2023-05-07 DIAGNOSIS — I5032 Chronic diastolic (congestive) heart failure: Secondary | ICD-10-CM | POA: Insufficient documentation

## 2023-05-07 DIAGNOSIS — I130 Hypertensive heart and chronic kidney disease with heart failure and stage 1 through stage 4 chronic kidney disease, or unspecified chronic kidney disease: Secondary | ICD-10-CM | POA: Diagnosis not present

## 2023-05-07 DIAGNOSIS — I2510 Atherosclerotic heart disease of native coronary artery without angina pectoris: Secondary | ICD-10-CM

## 2023-05-07 DIAGNOSIS — Z79899 Other long term (current) drug therapy: Secondary | ICD-10-CM | POA: Diagnosis not present

## 2023-05-07 DIAGNOSIS — Z7984 Long term (current) use of oral hypoglycemic drugs: Secondary | ICD-10-CM | POA: Insufficient documentation

## 2023-05-07 HISTORY — DX: Unspecified diastolic (congestive) heart failure: I50.30

## 2023-05-07 HISTORY — DX: Secondary polycythemia: D75.1

## 2023-05-07 HISTORY — DX: Unspecified rotator cuff tear or rupture of right shoulder, not specified as traumatic: M75.101

## 2023-05-07 HISTORY — DX: Other long term (current) drug therapy: Z79.899

## 2023-05-07 HISTORY — DX: Male erectile dysfunction, unspecified: N52.9

## 2023-05-07 HISTORY — PX: REVERSE SHOULDER ARTHROPLASTY: SHX5054

## 2023-05-07 LAB — GLUCOSE, CAPILLARY
Glucose-Capillary: 139 mg/dL — ABNORMAL HIGH (ref 70–99)
Glucose-Capillary: 139 mg/dL — ABNORMAL HIGH (ref 70–99)

## 2023-05-07 SURGERY — ARTHROPLASTY, SHOULDER, TOTAL, REVERSE
Anesthesia: Regional | Site: Shoulder | Laterality: Left

## 2023-05-07 MED ORDER — 0.9 % SODIUM CHLORIDE (POUR BTL) OPTIME
TOPICAL | Status: DC | PRN
  Administered 2023-05-07: 500 mL

## 2023-05-07 MED ORDER — EPHEDRINE 5 MG/ML INJ
INTRAVENOUS | Status: AC
  Filled 2023-05-07: qty 5

## 2023-05-07 MED ORDER — PHENYLEPHRINE HCL-NACL 20-0.9 MG/250ML-% IV SOLN
INTRAVENOUS | Status: DC | PRN
Start: 2023-05-07 — End: 2023-05-07
  Administered 2023-05-07: 25 ug/min via INTRAVENOUS

## 2023-05-07 MED ORDER — LIDOCAINE HCL (PF) 1 % IJ SOLN
INTRAMUSCULAR | Status: AC
  Filled 2023-05-07: qty 5

## 2023-05-07 MED ORDER — ROCURONIUM BROMIDE 10 MG/ML (PF) SYRINGE
PREFILLED_SYRINGE | INTRAVENOUS | Status: AC
  Filled 2023-05-07: qty 10

## 2023-05-07 MED ORDER — VANCOMYCIN HCL 1000 MG IV SOLR
INTRAVENOUS | Status: AC
  Filled 2023-05-07: qty 20

## 2023-05-07 MED ORDER — ROCURONIUM BROMIDE 100 MG/10ML IV SOLN
INTRAVENOUS | Status: DC | PRN
  Administered 2023-05-07: 10 mg via INTRAVENOUS
  Administered 2023-05-07: 30 mg via INTRAVENOUS
  Administered 2023-05-07 (×2): 10 mg via INTRAVENOUS

## 2023-05-07 MED ORDER — OXYCODONE HCL 5 MG/5ML PO SOLN: 5.0000 mg | Freq: Once | ORAL | Status: DC | PRN

## 2023-05-07 MED ORDER — SUGAMMADEX SODIUM 200 MG/2ML IV SOLN
INTRAVENOUS | Status: DC | PRN
Start: 2023-05-07 — End: 2023-05-07
  Administered 2023-05-07: 200 mg via INTRAVENOUS

## 2023-05-07 MED ORDER — VANCOMYCIN HCL 1000 MG IV SOLR
INTRAVENOUS | Status: DC | PRN
  Administered 2023-05-07: 1000 mg via TOPICAL

## 2023-05-07 MED ORDER — OXYCODONE HCL 5 MG PO TABS: 5.0000 mg | ORAL_TABLET | ORAL | 0 refills | Status: DC | PRN

## 2023-05-07 MED ORDER — LIDOCAINE HCL (PF) 2 % IJ SOLN
INTRAMUSCULAR | Status: AC
Start: 2023-05-07 — End: ?
  Filled 2023-05-07: qty 5

## 2023-05-07 MED ORDER — MIDAZOLAM HCL 2 MG/2ML IJ SOLN
1.0000 mg | Freq: Once | INTRAMUSCULAR | Status: AC
  Administered 2023-05-07: 2 mg via INTRAVENOUS

## 2023-05-07 MED ORDER — FENTANYL CITRATE PF 50 MCG/ML IJ SOSY: 50.0000 ug | PREFILLED_SYRINGE | Freq: Once | INTRAMUSCULAR | Status: DC

## 2023-05-07 MED ORDER — PHENYLEPHRINE 80 MCG/ML (10ML) SYRINGE FOR IV PUSH (FOR BLOOD PRESSURE SUPPORT)
PREFILLED_SYRINGE | INTRAVENOUS | Status: AC
  Filled 2023-05-07: qty 10

## 2023-05-07 MED ORDER — TRANEXAMIC ACID-NACL 1000-0.7 MG/100ML-% IV SOLN
INTRAVENOUS | Status: AC
  Filled 2023-05-07: qty 100

## 2023-05-07 MED ORDER — ONDANSETRON HCL 4 MG/2ML IJ SOLN
INTRAMUSCULAR | Status: DC | PRN
  Administered 2023-05-07: 4 mg via INTRAVENOUS

## 2023-05-07 MED ORDER — EPHEDRINE SULFATE-NACL 50-0.9 MG/10ML-% IV SOSY
PREFILLED_SYRINGE | INTRAVENOUS | Status: DC | PRN
  Administered 2023-05-07 (×2): 5 mg via INTRAVENOUS
  Administered 2023-05-07: 10 mg via INTRAVENOUS
  Administered 2023-05-07: 5 mg via INTRAVENOUS
  Administered 2023-05-07: 10 mg via INTRAVENOUS
  Administered 2023-05-07 (×4): 5 mg via INTRAVENOUS
  Administered 2023-05-07 (×2): 10 mg via INTRAVENOUS
  Administered 2023-05-07: 5 mg via INTRAVENOUS
  Administered 2023-05-07 (×2): 10 mg via INTRAVENOUS

## 2023-05-07 MED ORDER — ACETAMINOPHEN 10 MG/ML IV SOLN
INTRAVENOUS | Status: DC | PRN
  Administered 2023-05-07: 1000 mg via INTRAVENOUS

## 2023-05-07 MED ORDER — ACETAMINOPHEN 500 MG PO TABS: 1000.0000 mg | ORAL_TABLET | Freq: Three times a day (TID) | ORAL | 2 refills | Status: AC

## 2023-05-07 MED ORDER — TRANEXAMIC ACID-NACL 1000-0.7 MG/100ML-% IV SOLN
1000.0000 mg | INTRAVENOUS | Status: AC
  Administered 2023-05-07: 1000 mg via INTRAVENOUS

## 2023-05-07 MED ORDER — GLYCOPYRROLATE 0.2 MG/ML IJ SOLN
INTRAMUSCULAR | Status: DC | PRN
  Administered 2023-05-07 (×2): .2 mg via INTRAVENOUS

## 2023-05-07 MED ORDER — ORAL CARE MOUTH RINSE: 15.0000 mL | Freq: Once | OROMUCOSAL | Status: AC

## 2023-05-07 MED ORDER — CEFAZOLIN SODIUM-DEXTROSE 2-4 GM/100ML-% IV SOLN
INTRAVENOUS | Status: AC
  Filled 2023-05-07: qty 100

## 2023-05-07 MED ORDER — ONDANSETRON HCL 4 MG/2ML IJ SOLN
INTRAMUSCULAR | Status: AC
  Filled 2023-05-07: qty 2

## 2023-05-07 MED ORDER — ACETAMINOPHEN 10 MG/ML IV SOLN
INTRAVENOUS | Status: AC
  Filled 2023-05-07: qty 100

## 2023-05-07 MED ORDER — SODIUM CHLORIDE 0.9 % IV SOLN: INTRAVENOUS | Status: DC

## 2023-05-07 MED ORDER — OXYCODONE HCL 5 MG PO TABS: 5.0000 mg | ORAL_TABLET | Freq: Once | ORAL | Status: DC | PRN

## 2023-05-07 MED ORDER — LIDOCAINE HCL (CARDIAC) PF 100 MG/5ML IV SOSY
PREFILLED_SYRINGE | INTRAVENOUS | Status: DC | PRN
  Administered 2023-05-07: 100 mg via INTRAVENOUS

## 2023-05-07 MED ORDER — GLYCOPYRROLATE 0.2 MG/ML IJ SOLN
INTRAMUSCULAR | Status: AC
Start: 2023-05-07 — End: ?
  Filled 2023-05-07: qty 1

## 2023-05-07 MED ORDER — SUCCINYLCHOLINE CHLORIDE 200 MG/10ML IV SOSY
PREFILLED_SYRINGE | INTRAVENOUS | Status: DC | PRN
  Administered 2023-05-07: 80 mg via INTRAVENOUS

## 2023-05-07 MED ORDER — CEFAZOLIN SODIUM-DEXTROSE 2-4 GM/100ML-% IV SOLN
2.0000 g | Freq: Once | INTRAVENOUS | Status: AC
  Administered 2023-05-07: 2 g via INTRAVENOUS

## 2023-05-07 MED ORDER — BUPIVACAINE HCL (PF) 0.5 % IJ SOLN
INTRAMUSCULAR | Status: DC | PRN
  Administered 2023-05-07: 10 mL via PERINEURAL

## 2023-05-07 MED ORDER — BUPIVACAINE LIPOSOME 1.3 % IJ SUSP
INTRAMUSCULAR | Status: AC
  Filled 2023-05-07: qty 20

## 2023-05-07 MED ORDER — CEFAZOLIN SODIUM-DEXTROSE 2-4 GM/100ML-% IV SOLN
2.0000 g | INTRAVENOUS | Status: AC
  Administered 2023-05-07: 2 g via INTRAVENOUS

## 2023-05-07 MED ORDER — BUPIVACAINE LIPOSOME 1.3 % IJ SUSP
INTRAMUSCULAR | Status: DC | PRN
  Administered 2023-05-07: 20 mL via PERINEURAL

## 2023-05-07 MED ORDER — PROPOFOL 10 MG/ML IV BOLUS
INTRAVENOUS | Status: AC
  Filled 2023-05-07: qty 20

## 2023-05-07 MED ORDER — MIDAZOLAM HCL 2 MG/2ML IJ SOLN
INTRAMUSCULAR | Status: AC
  Filled 2023-05-07: qty 2

## 2023-05-07 MED ORDER — BUPIVACAINE HCL (PF) 0.5 % IJ SOLN
INTRAMUSCULAR | Status: AC
  Filled 2023-05-07: qty 10

## 2023-05-07 MED ORDER — EPHEDRINE 5 MG/ML INJ
INTRAVENOUS | Status: AC
Start: 2023-05-07 — End: ?
  Filled 2023-05-07: qty 5

## 2023-05-07 MED ORDER — CHLORHEXIDINE GLUCONATE 0.12 % MT SOLN
15.0000 mL | Freq: Once | OROMUCOSAL | Status: AC
  Administered 2023-05-07: 15 mL via OROMUCOSAL

## 2023-05-07 MED ORDER — FENTANYL CITRATE (PF) 100 MCG/2ML IJ SOLN: 25.0000 ug | INTRAMUSCULAR | Status: DC | PRN

## 2023-05-07 MED ORDER — ACETAMINOPHEN 10 MG/ML IV SOLN: 1000.0000 mg | Freq: Once | INTRAVENOUS | Status: DC | PRN

## 2023-05-07 MED ORDER — PHENYLEPHRINE HCL-NACL 20-0.9 MG/250ML-% IV SOLN
INTRAVENOUS | Status: AC
  Filled 2023-05-07: qty 250

## 2023-05-07 MED ORDER — SODIUM CHLORIDE 0.9 % IR SOLN
Status: DC | PRN
  Administered 2023-05-07: 3000 mL

## 2023-05-07 MED ORDER — LIDOCAINE HCL (PF) 1 % IJ SOLN
INTRAMUSCULAR | Status: DC | PRN
  Administered 2023-05-07 (×2): 3 mL via SUBCUTANEOUS

## 2023-05-07 MED ORDER — DEXAMETHASONE SODIUM PHOSPHATE 10 MG/ML IJ SOLN
INTRAMUSCULAR | Status: DC | PRN
  Administered 2023-05-07: 10 mg via INTRAVENOUS

## 2023-05-07 MED ORDER — PROPOFOL 10 MG/ML IV BOLUS
INTRAVENOUS | Status: DC | PRN
  Administered 2023-05-07: 150 mg via INTRAVENOUS

## 2023-05-07 MED ORDER — IRRISEPT - 450ML BOTTLE WITH 0.05% CHG IN STERILE WATER, USP 99.95% OPTIME
TOPICAL | Status: DC | PRN
  Administered 2023-05-07: 450 mL

## 2023-05-07 MED ORDER — DEXAMETHASONE SODIUM PHOSPHATE 10 MG/ML IJ SOLN
INTRAMUSCULAR | Status: AC
  Filled 2023-05-07: qty 1

## 2023-05-07 MED ORDER — DROPERIDOL 2.5 MG/ML IJ SOLN: 0.6250 mg | Freq: Once | INTRAMUSCULAR | Status: DC | PRN

## 2023-05-07 MED ORDER — FENTANYL CITRATE (PF) 100 MCG/2ML IJ SOLN
INTRAMUSCULAR | Status: AC
  Filled 2023-05-07: qty 2

## 2023-05-07 MED ORDER — ONDANSETRON 4 MG PO TBDP: 4.0000 mg | ORAL_TABLET | Freq: Three times a day (TID) | ORAL | 0 refills | Status: AC | PRN

## 2023-05-07 MED ORDER — FENTANYL CITRATE (PF) 100 MCG/2ML IJ SOLN
INTRAMUSCULAR | Status: DC | PRN
  Administered 2023-05-07: 50 ug via INTRAVENOUS

## 2023-05-07 MED ORDER — FENTANYL CITRATE PF 50 MCG/ML IJ SOSY
PREFILLED_SYRINGE | INTRAMUSCULAR | Status: AC
  Filled 2023-05-07: qty 1

## 2023-05-07 SURGICAL SUPPLY — 79 items
BASEPLATE GLEN ALTIVATE 6.5 (Joint) IMPLANT
BIT DRILL 12.7X2STRG SHNK (BIT) IMPLANT
BIT DRL 12.7X2STRG SHNK (BIT) ×1 IMPLANT
BLADE SAGITTAL WIDE XTHICK NO (BLADE) ×1 IMPLANT
CHLORAPREP W/TINT 26 (MISCELLANEOUS) ×1 IMPLANT
CNTNR URN SCR LID CUP LEK RST (MISCELLANEOUS) IMPLANT
COOLER POLAR GLACIER W/PUMP (MISCELLANEOUS) ×1 IMPLANT
DERMABOND ADVANCED .7 DNX12 (GAUZE/BANDAGES/DRESSINGS) IMPLANT
DRAPE INCISE IOBAN 66X45 STRL (DRAPES) ×1 IMPLANT
DRAPE SHEET LG 3/4 BI-LAMINATE (DRAPES) ×2 IMPLANT
DRAPE TABLE BACK 80X90 (DRAPES) ×1 IMPLANT
DRILL GLEN ALTIVATE 3.5 (DRILL) IMPLANT
DRSG OPSITE POSTOP 3X4 (GAUZE/BANDAGES/DRESSINGS) IMPLANT
DRSG OPSITE POSTOP 4X8 (GAUZE/BANDAGES/DRESSINGS) IMPLANT
DRSG TEGADERM 2-3/8X2-3/4 SM (GAUZE/BANDAGES/DRESSINGS) IMPLANT
ELECT REM PT RETURN 9FT ADLT (ELECTROSURGICAL) ×1 IMPLANT
ELECTRODE REM PT RTRN 9FT ADLT (ELECTROSURGICAL) ×1 IMPLANT
EVACUATOR 1/8 PVC DRAIN (DRAIN) ×1 IMPLANT
GAUZE SPONGE 2X2 STRL 8-PLY (GAUZE/BANDAGES/DRESSINGS) IMPLANT
GAUZE XEROFORM 1X8 LF (GAUZE/BANDAGES/DRESSINGS) IMPLANT
GLOVE BIOGEL PI IND STRL 8 (GLOVE) ×2 IMPLANT
GLOVE PI ULTRA LF STRL 7.5 (GLOVE) ×2 IMPLANT
GLOVE SURG ORTHO 8.0 STRL STRW (GLOVE) ×2 IMPLANT
GLOVE SURG SYN 8.0 (GLOVE) ×1 IMPLANT
GLOVE SURG SYN 8.0 PF PI (GLOVE) ×1 IMPLANT
GOWN STRL REUS W/ TWL LRG LVL3 (GOWN DISPOSABLE) ×2 IMPLANT
GOWN STRL REUS W/ TWL XL LVL3 (GOWN DISPOSABLE) ×1 IMPLANT
GUIDE WIRE ALTIVATE 2.4X228 SL (WIRE) IMPLANT
HEAD GLENOID W/SCREW 32MM-4 (Shoulder) IMPLANT
HOOD PEEL AWAY T7 (MISCELLANEOUS) ×2 IMPLANT
INSERT EPOLY STND HUMERUS 36MM (Shoulder) ×1 IMPLANT
INSERT EPOLYSTD HUMERUS 36MM (Shoulder) IMPLANT
JET LAVAGE IRRISEPT WOUND (IRRIGATION / IRRIGATOR) ×1 IMPLANT
KIT STABILIZATION SHOULDER (MISCELLANEOUS) ×1 IMPLANT
LAVAGE JET IRRISEPT WOUND (IRRIGATION / IRRIGATOR) IMPLANT
MANIFOLD NEPTUNE II (INSTRUMENTS) ×1 IMPLANT
MASK FACE SPIDER DISP (MASK) ×1 IMPLANT
MAT ABSORB FLUID 56X50 GRAY (MISCELLANEOUS) ×1 IMPLANT
NDL REVERSE CUT 1/2 CRC (NEEDLE) IMPLANT
NDL SPNL 20GX3.5 QUINCKE YW (NEEDLE) IMPLANT
NEEDLE REVERSE CUT 1/2 CRC (NEEDLE) IMPLANT
NEEDLE SPNL 20GX3.5 QUINCKE YW (NEEDLE) IMPLANT
NS IRRIG 500ML POUR BTL (IV SOLUTION) ×1 IMPLANT
PACK ARTHROSCOPY SHOULDER (MISCELLANEOUS) ×1 IMPLANT
PAD WRAPON POLAR SHDR XLG (MISCELLANEOUS) ×1 IMPLANT
PENCIL SMOKE EVACUATOR (MISCELLANEOUS) IMPLANT
PULSAVAC PLUS IRRIG FAN TIP (DISPOSABLE) ×1 IMPLANT
SCREW CENTR ALTIVATE 6.5X40 (Screw) IMPLANT
SCREW PERI ALTIVATE REV 26 (Screw) IMPLANT
SCREW PERI ALTIVATE REV 34 (Screw) IMPLANT
SCREW PERI ALTIVATE REV 38 (Screw) IMPLANT
SLING ULTRA II LG (MISCELLANEOUS) IMPLANT
SLING ULTRA II M (MISCELLANEOUS) IMPLANT
SOL .9 NS 3000ML IRR UROMATIC (IV SOLUTION) ×1 IMPLANT
SPONGE T-LAP 18X18 ~~LOC~~+RFID (SPONGE) ×1 IMPLANT
STAPLER SKIN PROX 35W (STAPLE) IMPLANT
STEM HUMERAL STD SHELL 10X48 (Stem) IMPLANT
STRAP SAFETY 5IN WIDE (MISCELLANEOUS) ×1 IMPLANT
SUT BONE WAX W31G (SUTURE) IMPLANT
SUT ETHIBOND 5-0 MS/4 CCS GRN (SUTURE) ×1 IMPLANT
SUT FIBERWIRE #2 38 BLUE 1/2 (SUTURE) ×4 IMPLANT
SUT MNCRL AB 4-0 PS2 18 (SUTURE) IMPLANT
SUT PROLENE 6 0 P 1 18 (SUTURE) IMPLANT
SUT SILK 2-0 18XBRD TIE 12 (SUTURE) IMPLANT
SUT TICRON 2-0 30IN 311381 (SUTURE) ×2 IMPLANT
SUT VIC AB 0 CT1 36 (SUTURE) ×1 IMPLANT
SUT VIC AB 2-0 CT2 27 (SUTURE) ×2 IMPLANT
SUT XBRAID 1.4 BLK/WHT (SUTURE) IMPLANT
SUT XBRAID 1.4 BLUE (SUTURE) IMPLANT
SUT XBRAID 1.4 WHITE/BLUE (SUTURE) IMPLANT
SUT XBRAID 2 BLACK/BLUE (SUTURE) IMPLANT
SUTURE ETHBND 5-0 MS/4 CCS GRN (SUTURE) ×1 IMPLANT
SUTURE FIBERWR #2 38 BLUE 1/2 (SUTURE) ×1 IMPLANT
SYR 30ML LL (SYRINGE) IMPLANT
TAP CANN GLEN 6.5 (TAP) IMPLANT
TIP FAN IRRIG PULSAVAC PLUS (DISPOSABLE) ×1 IMPLANT
TRAP FLUID SMOKE EVACUATOR (MISCELLANEOUS) ×1 IMPLANT
WATER STERILE IRR 1000ML POUR (IV SOLUTION) ×1 IMPLANT
WRAPON POLAR PAD SHDR XLG (MISCELLANEOUS) ×1 IMPLANT

## 2023-05-07 NOTE — Transfer of Care (Signed)
 Immediate Anesthesia Transfer of Care Note  Patient: Jacob Lawson  Procedure(s) Performed: ARTHROPLASTY, SHOULDER, TOTAL, REVERSE (Left: Shoulder)  Patient Location: PACU  Anesthesia Type:General and Regional  Level of Consciousness: drowsy, patient cooperative, and responds to stimulation  Airway & Oxygen Therapy: Patient Spontanous Breathing and Patient connected to face mask oxygen  Post-op Assessment: Report given to RN and Post -op Vital signs reviewed and stable  Post vital signs: Reviewed and stable  Last Vitals:  Vitals Value Taken Time  BP 133/72 05/07/23 1200  Temp 98   Pulse 66 05/07/23 1205  Resp 17 05/07/23 1205  SpO2 99 % 05/07/23 1205  Vitals shown include unfiled device data.  Last Pain:  Vitals:   05/07/23 0730  TempSrc:   PainSc: 0-No pain      Patients Stated Pain Goal: 0 (05/07/23 4098)  Complications: No notable events documented.

## 2023-05-07 NOTE — Evaluation (Signed)
 Occupational Therapy Evaluation Patient Details Name: Jacob Lawson MRN: [id number] DOB: [birth year] Today's Date: 05/07/2023   History of Present Illness   Jacob Lawson is a 68 y.o. male with PMH HTN, afib. S/p Left reverse total shoulder arthroplasty with biceps tenodesis on 05/07/23 with Dr. Allena Katz   Clinical Impressions Jacob Lawson was seen for OT evaluation this date. Prior to hospital admission, pt was IND. Pt lives with spouse in home c 2 STE. Pt currently requires SUPERVISION for toilet t/f and standing hand washing - spouse assists with setup. MOD A don/doff sling, shirt, and polar care in sitting. SBA complete x4 steps. Pt instructed in polar care mgt, sling/immobilizer mgt, ROM exercises for LUE (with instructions for no shoulder exercises until full sensation has returned), LUE precautions, and adaptive strategies for bathing/dressing. All education complete, will sign off. Upon hospital discharge, recommend no OT follow up.    If plan is discharge home, recommend the following:   A little help with walking and/or transfers;A little help with bathing/dressing/bathroom     Functional Status Assessment   Patient has had a recent decline in their functional status and demonstrates the ability to make significant improvements in function in a reasonable and predictable amount of time.     Equipment Recommendations   None recommended by OT     Recommendations for Other Services         Precautions/Restrictions   Precautions Precautions: Shoulder Shoulder Interventions: Shoulder sling/immobilizer;Shoulder abduction pillow;Off for dressing/bathing/exercises Restrictions Weight Bearing Restrictions Per Provider Order: Yes LUE Weight Bearing Per Provider Order: Non weight bearing     Mobility Bed Mobility               General bed mobility comments: not tested    Transfers Overall transfer level: Modified independent                 General transfer  comment: increased time      Balance Overall balance assessment: Needs assistance Sitting-balance support: No upper extremity supported, Feet supported Sitting balance-Leahy Scale: Normal     Standing balance support: No upper extremity supported, During functional activity Standing balance-Leahy Scale: Fair                             ADL either performed or assessed with clinical judgement   ADL Overall ADL's : Needs assistance/impaired                                       General ADL Comments: SUPERVISION for toilet t/f and standing hand washing - spouse assists with setup. MOD A don/doff sling, shirt, and polar care in sitting. SBA complete x4 steps     Vision         Perception         Praxis         Pertinent Vitals/Pain Pain Assessment Pain Assessment: No/denies pain     Extremity/Trunk Assessment Upper Extremity Assessment Upper Extremity Assessment: Right hand dominant;LUE deficits/detail LUE: Unable to fully assess due to immobilization   Lower Extremity Assessment Lower Extremity Assessment: Overall WFL for tasks assessed       Communication Communication Communication: No apparent difficulties   Cognition Arousal: Alert Behavior During Therapy: WFL for tasks assessed/performed Cognition: No apparent impairments  Following commands: Intact       Cueing  General Comments          Exercises     Shoulder Instructions      Home Living Family/patient expects to be discharged to:: Private residence Living Arrangements: Spouse/significant other Available Help at Discharge: Family Type of Home: House Home Access: Stairs to enter Secretary/administrator of Steps: 2   Home Layout: One level                          Prior Functioning/Environment Prior Level of Function : Independent/Modified Independent                    OT Problem List:  Decreased range of motion   OT Treatment/Interventions:        OT Goals(Current goals can be found in the care plan section)   Acute Rehab OT Goals Patient Stated Goal: go home OT Goal Formulation: With patient Time For Goal Achievement: 05/07/23 Potential to Achieve Goals: Good   OT Frequency:       Co-evaluation              AM-PAC OT "6 Clicks" Daily Activity     Outcome Measure Help from another person eating meals?: None Help from another person taking care of personal grooming?: None Help from another person toileting, which includes using toliet, bedpan, or urinal?: A Little Help from another person bathing (including washing, rinsing, drying)?: A Little Help from another person to put on and taking off regular upper body clothing?: A Lot Help from another person to put on and taking off regular lower body clothing?: A Lot 6 Click Score: 18   End of Session Nurse Communication: Mobility status  Activity Tolerance: Patient tolerated treatment well Patient left: in chair;with call bell/phone within reach;with family/visitor present;with nursing/sitter in room  OT Visit Diagnosis: Unsteadiness on feet (R26.81)                Time: 0865-7846 OT Time Calculation (min): 29 min Charges:  OT General Charges $OT Visit: 1 Visit OT Evaluation $OT Eval Low Complexity: 1 Low  Gordan Latina, M.S. OTR/L  05/07/23, 3:24 PM  ascom [phone]

## 2023-05-07 NOTE — Progress Notes (Signed)
 PT Cancellation Note  Patient Details Name: Jacob Lawson MRN: [id number] DOB: February 08, 1955   Cancelled Treatment:    Reason Eval/Treat Not Completed: PT screened, no needs identified, will sign off (I observed patient walking around the unit and going up/down steps without difficulty with the occupational therapist. No apparent acute PT needs at this time.)  [name], PT, MPT  [name] 05/07/2023, 3:17 PM

## 2023-05-07 NOTE — Anesthesia Procedure Notes (Signed)
 Anesthesia Regional Block: Interscalene brachial plexus block   Pre-Anesthetic Checklist: , timeout performed,  Correct Patient, Correct Site, Correct Laterality,  Correct Procedure, Correct Position, site marked,  Risks and benefits discussed,  Surgical consent,  Pre-op evaluation,  At surgeon's request and post-op pain management  Laterality: Upper and Left  Prep: chloraprep       Needles:  Injection technique: Single-shot  Needle Type: Echogenic Stimulator Needle     Needle Length: 10cm  Needle Gauge: 20     Additional Needles:   Procedures:,,,, ultrasound used (permanent image in chart),,    Narrative:  End time: 05/07/2023 7:34 AM  Performed by: Personally  Anesthesiologist: Zula Hitch, MD  Additional Notes: Pt. Identified and accepting of procedure after risks and benefits fully reviewed and questions answered. Time out performed and laterality confirmed prior to procedure.  ISNB  performed without difficulty and well tolerated.  Neg IV and SATD.  No pain on injection of Local anesthetic and VSST.

## 2023-05-07 NOTE — H&P (Signed)
 Paper H&P to be scanned into permanent record. H&P reviewed. No significant changes noted.

## 2023-05-07 NOTE — Discharge Instructions (Addendum)
 Jacob Albee, MD  [facility]  Phone: [phone]  Fax: [phone]   Discharge Instructions after Reverse Shoulder Replacement    1. Activity/Sling: You are to be non-weight bearing on operative extremity. A sling/shoulder immobilizer has been provided for you. Only remove the sling to perform elbow, wrist, and hand RoM exercises and hygiene/dressing. Active reaching and lifting are not permitted. You will be given further instructions on sling use at your first physical therapy visit and postoperative visit with Dr. Allena Katz.   2. Dressings: Dressing may be removed at 1st physical therapy visit (~3-4 days after surgery). Afterwards, you may either leave open to air (if no drainage) or cover with dry, sterile dressing. If you have steri-strips on your wound, please do not remove them. They will fall off on their own. You may shower 5 days after surgery. Please pat incision dry. Do not rub or place any shear forces across incision. If there is drainage or any opening of incision after 5 days, please notify our offices immediately.    3. Driving:  Plan on not driving for six weeks. Please note that you are advised NOT to drive while taking narcotic pain medications as you may be impaired and unsafe to drive.   4. Medications:  - You have been provided a prescription for narcotic pain medicine (usually oxycodone). After surgery, take 1-2 narcotic tablets every 4 hours if needed for severe pain. Please start this as soon as you begin to start having pain (if you received a nerve block, start taking as soon as this wears off).  - A prescription for anti-nausea medication will be provided in case the narcotic medicine causes nausea - take 1 tablet every 6 hours only if nauseated.  - Take enteric coated aspirin 325 mg once daily for 6 weeks to prevent blood clots. Do not take aspirin if you have an aspirin sensitivity/allergy or asthma or are on an anticoagulant (blood thinner) already. If so, then  your home anticoagulant will be resume and managed - do not take aspirin. -Take tylenol 1000mg  (2 Extra strength or 3 regular strength tablets) every 8 hours for pain. This will reduce the amount of narcotic medication needed. May stop tylenol when you are having minimal pain. - Take a stool softener (Colace, Dulcolax or Senakot) if you are using narcotic pain medications to help with constipation that is associated with narcotic use. - DO NOT take ANY nonsteroidal anti-inflammatory pain medications: Advil, Motrin, Ibuprofen, Aleve, Naproxen, or Naprosyn.   If you are taking prescription medication for anxiety, depression, insomnia, muscle spasm, chronic pain, or for attention deficit disorder you are advised that you are at a higher risk of adverse effects with use of narcotics post-op, including narcotic addiction/dependence, depressed breathing, death. If you use non-prescribed substances: alcohol, marijuana, cocaine, heroin, methamphetamines, etc., you are at a higher risk of adverse effects with use of narcotics post-op, including narcotic addiction/dependence, depressed breathing, death. You are advised that taking > 50 morphine milligram equivalents (MME) of narcotic pain medication per day results in twice the risk of overdose or death. For your prescription provided: oxycodone 5 mg - taking more than 6 tablets per day after the first few days of surgery.   5. Physical Therapy: 1-2 times per week for ~12 weeks. Therapy typically starts on post operative Day 3 or 4. You have been provided an order for physical therapy. The therapist will provide home exercises. Please contact our offices if this appointment has not been scheduled.  6. Work: May do light duty/desk job in approximately 2 weeks when off of narcotics, pain is well-controlled, and swelling has decreased if able to function with one arm in sling. Full work may take 6 weeks if light motions and function of both arms is required.  Lifting jobs may require 12 weeks.   7. Post-Op Appointments: Your first post-op appointment will be with Dr. Allena Katz in approximately 2 weeks time.    If you find that they have not been scheduled please call the Orthopaedic Appointment front desk at [phone].                               Jacob Albee, MD [facility] Phone: [phone] Fax: [phone]   REVERSE SHOULDER ARTHROPLASTY REHAB GUIDELINES   These guidelines should be tailored to individual patients based on their rehab goals, age, precautions, quality of repair, etc.  Progression should be based on patient progress and approval by the referring physician.  PHASE 1 - Day 1 through Week 2  GENERAL GUIDELINES AND PRECAUTIONS Sling wear 24/7 except during grooming and home exercises (3 to 5 times daily) Avoid shoulder extension such that the arm is posterior the frontal plane.  When patients recline, a pillow should be placed behind the upper arm and sling should be on.  They should be advised to always be able to see the elbow Avoid combined IR/ADD/EXT, such as hand behind back to prevent dislocation Avoid combined IR and ADD such as reaching across the chest to prevent dislocation No AROM No submersion in pool/water for 4 weeks No weight bearing through operative arm (as in transfers, walker use, etc.)  GOALS Maintain integrity of joint replacement; protect soft tissue healing Increase PROM for elevation to 120 and ER to 30 (will remain the goal for first 6 weeks) Optimize distal UE circulation and muscle activity (elbow, wrist and hand) Instruct in use of sling for proper fit, polar care device for ice application after HEP, signs/symptoms of infection  EXERCISES Active elbow, wrist and hand Passive forward elevation in scapular plane to 90-120 max motion; ER in scapular plane to 30 Active scapular retraction with arms resting in neutral position  CRITERIA TO PROGRESS TO  PHASE 2 Low pain (less than 3/10) with shoulder PROM Healing of incision without signs of infection Clearance by MD to advance after 2 week MD check up  PHASE 2 - 2 weeks - 6 weeks  GENERAL GUIDELINES AND PRECAUTIONS Sling may be removed while at home; worn in community without abduction pillow May use arm for light activities of daily living (such as feeding, brushing teeth, dressing.) with elbow near  the side of the body  and arm in front of the body- no active lifting of the arm May submerge in water (tub, pool, [place], etc.) after 4 weeks Continue to avoid WBing through the operative arm Continue to avoid combined IR/EXT/ADD (hand behind the back) and IR/ADD  (reaching across chest) for dislocation precautions  GOALS  Achieve passive elevation to 120 and ER to 30  Low (less than 3/10) to no pain  Ability to fire all heads of the deltoid  EXERCISES May discontinue grip, and active elbow and wrist exercises since using the arm in ADL's  with sling removed around the home Continue passive elevation to 120 and ER to 30, both in scapular plane with arm supported on table top Add submaximal isometrics, pain free effort, for all  functional heads of deltoid (anterior, posterior, middle)  Ensure that with posterior deltoid isometric the shoulder does not move into extension and the arm remains anterior the frontal plane At 4 weeks:  begin to place arm in balanced position of 90 deg elevation in supine; when patient able to hold this position with ease, may begin reverse pendulums clockwise and counterclockwise  CRITERIA TO PROGRESS TO PHASE 3 Passive forward elevation in scapular plane to 120; passive ER in scapular plane to 30 Ability to fire isometrically all heads of the deltoid muscle without pain Ability to place and hold the arm in balanced position (90 deg elevation in supine)  PHASE 3 - 6 weeks to 3 months  GENERAL GUIDELINES AND PRECAUTIONS Discontinue use of sling Avoid  forcing end range motion in any direction to prevent dislocation  May advance use of the arm actively in ADL's without being restricted to arm by the side of the body, however, avoid heavy lifting and sports (forever!) May initiate functional IR behind the back gently NO UPPER BODY ERGOMETER   GOALS Optimize PROM for elevation and ER in scapular plane with realistic expectation that max  mobility for elevation is usually around 145-160 passively; ER 40 to 50 passively; functional IR to L1 Recover AROM to approach as close to PROM available as possible; may expect 135-150 deg active elevation; 30 deg active ER; active functional IR to L1 Establish dynamic stability of the shoulder with deltoid and periscapular muscle gradual strengthening  EXERCISES Forward elevation in scapular plane active progression: supine to incline, to vertical; short to long lever arm Balanced position long lever arm AROM Active ER/IR with arm at side Scapular retraction with light band resistance Functional IR with hand slide up back - very gentle and gradual NO UPPER BODY ERGOMETER     CRITERIA TO PROGRESS TO PHASE 4  AROM equals/approaches PROM with good mechanics for elevation   No pain  Higher level demand on shoulder than ADL functions   PHASE 4 12 months and beyond  GENERAL GUIDELINES AND PRECAUTIONS No heavy lifting and no overhead sports No heavy pushing activity Gradually increase strength of deltoid and scapular stabilizers; also the rotator cuff if present with weights not to exceed 5 lbs NO UPPER BODY ERGOMETER   GOALS  Optimize functional use of the operative UE to meet the desired demands  Gradual increase in deltoid, scapular muscle, and rotator cuff strength  Pain free functional activities   EXERCISES Add light hand weights for deltoid up to and not to exceed 3 lbs for anterior and posterior with long arm lift against gravity; elbow bent to 90 deg for abduction in scapular  plane Theraband progression for extension to hip with scapular depression/retraction Theraband progression for serratus anterior punches in supine; avoid wall, incline or prone pressups for serratus anterior End range stretching gently without forceful overpressure in all planes (elevation in scapular plane, ER in scapular plane, functional IR) with stretching done for life as part of a daily routine NO UPPER BODY ERGOMETER     CRITERIA FOR DISCHARGE FROM SKILLED PHYSICAL THERAPY  Pain free AROM for shoulder elevation (expect around 135-150)  Functional strength for all ADL's, work tasks, and hobbies approved by Careers adviser  Independence with home maintenance program   NOTES: 1. With proper exercise, motion, strength, and function continue to improve even after one year. 2. The complication rate after surgery is 5 - 8%. Complications include infection, fracture, heterotopic bone formation, nerve injury, instability, rotator cuff  tear, and tuberosity nonunion. Please look for clinical signs, unusual symptoms, or lack of progress with therapy and report those to Dr. Allena Katz. Prefer more communication than less.  3. The therapy plan above only serves as a guide. Please be aware of specific individualized patient instructions as written on the prescription or through discussions with the surgeon. 4. Please call Dr. Allena Katz if you have any specific questions or concerns [phone]   POLAR CARE INFORMATION  MassAdvertisement.it  How to use [employer] Polar Care [facility] Therapy System?  YouTube   ShippingScam.co.uk  OPERATING INSTRUCTIONS  Start the product With dry hands, connect the transformer to the electrical connection located on the top of the cooler. Next, plug the transformer into an appropriate electrical outlet. The unit will automatically start running at this point.  To stop the pump, disconnect electrical power.  Unplug to stop the product when not in use.  Unplugging the Polar Care unit turns it off. Always unplug immediately after use. Never leave it plugged in while unattended. Remove pad.    FIRST ADD WATER TO FILL LINE, THEN ICE---Replace ice when existing ice is almost melted  1 Discuss Treatment with your Licensed Health Care Practitioner and Use Only as Prescribed 2 Apply Insulation Barrier & Cold Therapy Pad 3 Check for Moisture 4 Inspect Skin Regularly  Tips and Trouble Shooting Usage Tips 1. Use cubed or chunked ice for optimal performance. 2. It is recommended to drain the Pad between uses. To drain the pad, hold the Pad upright with the hose pointed toward the ground. Depress the black plunger and allow water to drain out. 3. You may disconnect the Pad from the unit without removing the pad from the affected area by depressing the silver tabs on the hose coupling and gently pulling the hoses apart. The Pad and unit will seal itself and will not leak. Note: Some dripping during release is normal. 4. DO NOT RUN PUMP WITHOUT WATER! The pump in this unit is designed to run with water. Running the unit without water will cause permanent damage to the pump. 5. Unplug unit before removing lid.  TROUBLESHOOTING GUIDE Pump not running, Water not flowing to the pad, Pad is not getting cold 1. Make sure the transformer is plugged into the wall outlet. 2. Confirm that the ice and water are filled to the indicated levels. 3. Make sure there are no kinks in the pad. 4. Gently pull on the blue tube to make sure the tube/pad junction is straight. 5. Remove the pad from the treatment site and ll it while the pad is lying at; then reapply. 6. Confirm that the pad couplings are securely attached to the unit. Listen for the double clicks (Figure 1) to confirm the pad couplings are securely attached.  Leaks    Note: Some condensation on the lines, controller, and pads is unavoidable, especially in warmer climates. 1. If using a [employer] Polar Care Cold  Therapy unit with a detachable Cold Therapy Pad, and a leak exists (other than condensation on the lines) disconnect the pad couplings. Make sure the silver tabs on the couplings are depressed before reconnecting the pad to the pump hose; then confirm both sides of the coupling are properly clicked in. 2. If the coupling continues to leak or a leak is detected in the pad itself, stop using it and call [employer] Customer Care at [phone].  Cleaning After use, empty and dry the unit with a soft cloth. Warm water and mild  detergent may be used occasionally to clean the pump and tubes.  WARNING: The Polar Care Cube can be cold enough to cause serious injury, including full skin necrosis. Follow these Operating Instructions, and carefully read the Product Insert (see pouch on side of unit) and the Cold Therapy Pad Fitting Instructions (provided with each Cold Therapy Pad) prior to use.  SHOULDER SLING IMMOBILIZER   VIDEO Slingshot 2 Shoulder Brace Application - YouTube ---https://www.porter.info/  INSTRUCTIONS While supporting the injured arm, slide the forearm into the sling. Wrap the adjustable shoulder strap around the neck and shoulders and attach the strap end to the sling using  the "alligator strap tab."  Adjust the shoulder strap to the required length. Position the shoulder pad behind the neck. To secure the shoulder pad location (optional), pull the shoulder strap away from the shoulder pad, unfold the hook material on the top of the pad, then press the shoulder strap back onto the hook material to secure the pad in place. Attach the closure strap across the open top of the sling. Position the strap so that it holds the arm securely in the sling. Next, attach the thumb strap to the open end of the sling between the thumb and fingers. After sling has been fit, it may be easily removed and reapplied using the quick release buckle on shoulder strap. If a neutral pillow or  15 abduction pillow is included, place the pillow at the waistline. Attach the sling to the pillow, lining up hook material on the pillow with the loop on sling. Adjust the waist strap to fit.  If waist strap is too long, cut it to fit. Use the small piece of double sided hook material (located on top of the pillow) to secure the strap end. Place the double sided hook material on the inside of the cut strap end and secure it to the waist strap.     If no pillow is included, attach the waist strap to the sling and adjust to fit.    Washing Instructions: Straps and sling must be removed and cleaned regularly depending on your activity level and perspiration. Hand wash straps and sling in cold water with mild detergent, rinse, air dry        Interscalene Nerve Block with Exparel   For your surgery you have received an Interscalene Nerve Block with Exparel. Nerve Blocks affect many types of nerves, including nerves that control movement, pain and normal sensation.  You may experience feelings such as numbness, tingling, heaviness, weakness or the inability to move your arm or the feeling or sensation that your arm has "fallen asleep". A nerve block with Exparel can last up to 5 days.  Usually the weakness wears off first.  The tingling and heaviness usually wear off next.  Finally you may start to notice pain.  Keep in mind that this may occur in any order.  Once a nerve block starts to wear off it is usually completely gone within 60 minutes. ISNB may cause mild shortness of breath, a hoarse voice, blurry vision, unequal pupils, or drooping of the face on the same side as the nerve block.  These symptoms will usually resolve with the numbness.  Very rarely the procedure itself can cause mild seizures. If needed, your surgeon will give you a prescription for pain medication.  It will take about 60 minutes for the oral pain medication to become fully effective.  So, it is recommended that you start taking  this medication  before the nerve block first begins to wear off, or when you first begin to feel discomfort. Take your pain medication only as prescribed.  Pain medication can cause sedation and decrease your breathing if you take more than you need for the level of pain that you have. Nausea is a common side effect of many pain medications.  You may want to eat something before taking your pain medicine to prevent nausea. After an Interscalene nerve block, you cannot feel pain, pressure or extremes in temperature in the effected arm.  Because your arm is numb it is at an increased risk for injury.  To decrease the possibility of injury, please practice the following:  While you are awake change the position of your arm frequently to prevent too much pressure on any one area for prolonged periods of time.  If you have a cast or tight dressing, check the color or your fingers every couple of hours.  Call your surgeon with the appearance of any discoloration (white or blue). If you are given a sling to wear before you go home, please wear it  at all times until the block has completely worn off.  Do not get up at night without your sling. Please contact [facility] or your surgeon if you do not begin to regain sensation after 7 days from the surgery.  Anesthesia may be contacted by calling the Same Day Surgery Department, Mon. through Fri., 6 am to 4 pm at [phone].   If you experience any other problems or concerns, please contact your surgeon's office. If you experience severe or prolonged shortness of breath go to the nearest emergency department.

## 2023-05-07 NOTE — TOC Transition Note (Signed)
 Transition of Care [facility]) - Discharge Note   Patient Details  Name: Jacob Lawson MRN: [id number] Date of Birth: Mar 23, 1955  [facility] CM/SW Contact:  Alexandra Ice, RN Phone Number: 05/07/2023, 3:54 PM   Clinical Narrative:      Transition of Care [facility]) - Inpatient Brief Assessment   Patient Details  Name: Jacob Lawson MRN: [id number] Date of Birth: [birth year]  [facility] of Care [facility]) CM/SW Contact:    Alexandra Ice, RN Phone Number: 05/07/2023, 3:54 PM   Clinical Narrative: Patient has discharge orders; discharge home. No TOC needs identified.   Transition of Care Asessment: Insurance and Status: Insurance coverage has been reviewed Patient has primary care physician: Yes Home environment has been reviewed: lives with spouse Prior level of function:: indpendent Prior/Current Home Services: No current home services Social Drivers of Health Review: SDOH reviewed no interventions necessary Readmission risk has been reviewed: Yes Transition of care needs: no transition of care needs at this time        Patient Goals and CMS Choice            Discharge Placement                       Discharge Plan and Services Additional resources added to the After Visit Summary for                                       Social Drivers of Health (SDOH) Interventions SDOH Screenings   Food Insecurity: No Food Insecurity (04/11/2023)  Housing: Unknown (04/11/2023)  Transportation Needs: No Transportation Needs (04/11/2023)  Utilities: Not At Risk (04/11/2023)  Depression (PHQ2-9): Low Risk  (04/11/2023)  Financial Resource Strain: Low Risk  (08/30/2022)   Received from [facility]  Tobacco Use: Low Risk  (05/07/2023)     Readmission Risk Interventions     No data to display

## 2023-05-07 NOTE — Anesthesia Preprocedure Evaluation (Signed)
 Anesthesia Evaluation  Patient identified by MRN, date of birth, ID band Patient awake    Reviewed: Allergy & Precautions, H&P , NPO status , Patient's Chart, lab work & pertinent test results, reviewed documented beta blocker date and time   Airway Mallampati: IV  TM Distance: <3 FB Neck ROM: full    Dental  (+) Teeth Intact   Pulmonary sleep apnea and Continuous Positive Airway Pressure Ventilation    Pulmonary exam normal        Cardiovascular Exercise Tolerance: Poor hypertension, On Medications + angina with exertion + DOE  (-) CHF, (-) Orthopnea and (-) PND Atrial Fibrillation  Rhythm:regular Rate:Normal     Neuro/Psych  Headaches PSYCHIATRIC DISORDERS Anxiety Depression     Neuromuscular disease    GI/Hepatic Neg liver ROS,GERD  Medicated,,  Endo/Other  diabetesHypothyroidism    Renal/GU Renal disease  negative genitourinary   Musculoskeletal   Abdominal   Peds  Hematology negative hematology ROS (+)   Anesthesia Other Findings Past Medical History: No date: (HFpEF) heart failure with preserved ejection fraction (HCC) No date: Acquired hypothyroidism No date: Allergic rhinitis No date: Anginal pain (HCC) No date: Anticoagulated on apixaban No date: Aortic atherosclerosis (HCC) No date: Aortic root dilation (HCC) No date: Atrial fibrillation (HCC)     Comment:  a.) CHA2DS2VASc = 5 (age, HFpEF, HTN, vascular disease,               T2DM) as of 05/01/2023; b.) s/p DCCV 03/17/2021 --> 120 J              x 1 --> NSR; c.) failed TEE with cardioversion 02/18/2023              --> became apneic with desat to 60s --> procedure               aborted; d.) s/p DCCV 03/18/2023 --> 120 J x 1 -->; e.)               c.) rate/rhythm maintained on oral amiodarone +               carvedilol; chronically anticoagulated with apixaban 11/14/2018: Bacteremia due to Gram-negative bacteria     Comment:  a.) setting of puncture  wound to LEFT foot No date: Bilateral leg edema No date: BPH (benign prostatic hyperplasia) No date: Carpal tunnel syndrome on both sides No date: Chronic lower back pain No date: CKD (chronic kidney disease), stage III (HCC) No date: Colloid LEFT thyroid nodule No date: Depression 11/20/2022: Difficult airway for intubation     Comment:  a.) Grade I view with McGrath X3 blade; small mouth               opening, large tongue, crowded redundant tissue No date: DOE (dyspnea on exertion) No date: Ectatic abdominal aorta (HCC) No date: Erectile dysfunction     Comment:  a.) on PDE5i (tadalafil) No date: Generalized anxiety disorder No date: GERD (gastroesophageal reflux disease) No date: Hepatic steatosis No date: Hyperlipidemia No date: Hypertension No date: Hypogonadism in male     Comment:  a.) s/p Testim implant in 2014. b.) uses exogenous               testosterone No date: Hypothyroidism No date: Long term current use of amiodarone No date: Migraines No date: Obesity No date: Occipital neuralgia No date: OSA on CPAP No date: Osteoarthritis No date: Pulmonary nodules No date: Renal cyst, left No date: Right rotator cuff tear  No date: Secondary polycythemia     Comment:  a.) OSAH + exogenous testosterone use + obesity No date: T2DM (type 2 diabetes mellitus) (HCC) No date: Vitiligo No date: Wears hearing aid in both ears Past Surgical History: No date: APPENDECTOMY 11/20/2022: ARTHOSCOPIC ROTAOR CUFF REPAIR; Left     Comment:  Procedure: Left shoulder open irrigation and               debridement, arthroscopic irrigation and debridement and               extensive debridment;  Surgeon: Lorri Rota, MD;                Location: [facility];  Service: Orthopedics;  Laterality:               Left; 02/18/2023: CARDIOVERSION; N/A     Comment:  Procedure: CARDIOVERSION;  Surgeon: Dorita Garter, MD;              Location: [facility];  Service: Cardiovascular;                 Laterality: N/A; 03/18/2023: CARDIOVERSION; N/A     Comment:  Procedure: CARDIOVERSION;  Surgeon: Antonette Batters,               MD;  Location: [facility];  Service: Cardiovascular;                Laterality: N/A; 11/2003: CARPAL TUNNEL RELEASE; Right 11/19/2017: CARPAL TUNNEL RELEASE; Left     Comment:  Procedure: LEFT OPEN CARPAL TUNNEL RELEASE;  Surgeon:               Josephus Nida, MD;  Location: [facility];                Service: Orthopedics;  Laterality: Left;  Diabetic - oral              meds Sleep Apnea 03/22/2020: CHONDROPLASTY; Right     Comment:  Procedure: CHONDROPLASTY;  Surgeon: Elner Hahn, MD;                Location: [facility];  Service: Orthopedics;  Laterality:               Right;  ABRASION CHONDROPLASTY No date: COLONOSCOPY     Comment:  2/91, 6/00, 01/04, 7/08, 10/10, 12/13, 02/26/17 03/07/2020: COLONOSCOPY WITH PROPOFOL; N/A     Comment:  Procedure: COLONOSCOPY WITH PROPOFOL;  Surgeon:               Shane Darling, MD;  Location: [facility];                Service: Endoscopy;  Laterality: N/A; No date: ESOPHAGOGASTRODUODENOSCOPY     Comment:  2/91, 1/95, 6/00, 1/04, 7/08, 10/10 03/07/2020: ESOPHAGOGASTRODUODENOSCOPY     Comment:  Procedure: ESOPHAGOGASTRODUODENOSCOPY (EGD);  Surgeon:               Shane Darling, MD;  Location: [facility];                Service: Endoscopy;; 06/2007: EXCISION NEUROMA; Left     Comment:  foot  had this done twice No date: FINGER SURGERY; Bilateral     Comment:  for congenital webbing 09/23/2019: GANGLION CYST EXCISION; Right     Comment:  Procedure: REMOVAL GANGLION OF FLEXOR SHEATH RIGHT LONG               FINGER;  Surgeon:  Poggi, Excell Seltzer, MD;  Location: [facility];              Service: Orthopedics;  Laterality: Right; 11/09/2018: IRRIGATION AND DEBRIDEMENT FOOT; Left     Comment:  Procedure: IRRIGATION AND DEBRIDEMENT FOOT;  Surgeon:               Linus Galas, DPM;  Location: [facility];   Service:               Podiatry;  Laterality: Left; 03/22/2020: KNEE ARTHROSCOPY; Right     Comment:  Procedure: RIGHT KNEE ARTHROSCOPY WITH PARTIAL               MENISCECTOMY;  Surgeon: Christena Flake, MD;  Location:               [facility];  Service: Orthopedics;  Laterality: Right; 05/25/2021: KNEE ARTHROSCOPY; Right     Comment:  Procedure: RIGHT KNEE ARTHROSCOPY WITH DEBRIDEMENT AND               PARTIAL MEDIAL MENISCECTOMY;  Surgeon: Christena Flake, MD;              Location: [facility];  Service: Orthopedics;  Laterality:               Right; 03/22/2020: KNEE ARTHROSCOPY WITH MENISCAL REPAIR; Right     Comment:  Procedure: KNEE ARTHROSCOPY WITH MENISCAL REPAIR;                Surgeon: Christena Flake, MD;  Location: [facility];                Service: Orthopedics;  Laterality: Right; No date: LASIK; Bilateral No date: MANDIBLE SURGERY     Comment:  in late 30s, fo overbite 12/2015: PROSTATE BIOPSY 02/2005: ROTATOR CUFF REPAIR; Left 11/20/2022: SHOULDER ARTHROSCOPY WITH DEBRIDEMENT AND BICEP TENDON  REPAIR; Left     Comment:  Procedure: Left shoulder open irrigation and               debridement, arthroscopic irrigation and debridement and               extensive debridment;  Surgeon: Signa Kell, MD;                Location: [facility];  Service: Orthopedics;  Laterality:               Left; 07/16/2019: SHOULDER ARTHROSCOPY WITH SUBACROMIAL DECOMPRESSION AND  OPEN ROTATOR C; Right     Comment:  Procedure: Right shoulder arthroscopy with debridement,               decompression, possible rotator cuff repair, and probable              biceps tenodesis;  Surgeon: Christena Flake, MD;  Location:              [facility];  Service: Orthopedics;  Laterality: Right; 02/18/2023: TEE WITHOUT CARDIOVERSION; N/A     Comment:  Procedure: TRANSESOPHAGEAL ECHOCARDIOGRAM (TEE);                Surgeon: Tiajuana Amass, MD;  Location: [facility];                Service: Cardiovascular;  Laterality: N/A; No  date: THYROIDECTOMY, PARTIAL No date: TONSILLECTOMY AND ADENOIDECTOMY BMI    Body Mass Index: 33.23 kg/m     Reproductive/Obstetrics negative OB ROS  Anesthesia Physical Anesthesia Plan  ASA: 3  Anesthesia Plan: General ETT   Post-op Pain Management: Regional block*   Induction:   PONV Risk Score and Plan: 3  Airway Management Planned:   Additional Equipment:   Intra-op Plan:   Post-operative Plan:   Informed Consent: I have reviewed the patients History and Physical, chart, labs and discussed the procedure including the risks, benefits and alternatives for the proposed anesthesia with the patient or authorized representative who has indicated his/her understanding and acceptance.     Dental Advisory Given  Plan Discussed with: CRNA  Anesthesia Plan Comments:        Anesthesia Quick Evaluation

## 2023-05-07 NOTE — Op Note (Addendum)
 SURGERY DATE: 05/07/2023   PRE-OP DIAGNOSIS:  1. Left shoulder rotator cuff arthropathy   POST-OP DIAGNOSIS:  1. Left shoulder rotator cuff arthropathy   PROCEDURES:  1. Left reverse total shoulder arthroplasty 2. Left biceps tenodesis   SURGEON: Rosealee Albee, MD  ASSISTANTS: Dedra Skeens, PA   ANESTHESIA: Gen + interscalene block   ESTIMATED BLOOD LOSS: 300cc   TOTAL IV FLUIDS: per anesthesia record  IMPLANTS: [employer]: RSP Glenoid Head w/Retaining screw 36-4; Augmented (10 degree) Reverse Shoulder Baseplate with 6.73mm x 40mm central screw; 3 locking screws into baseplate; Standard Shell Short Humeral Stem 10 x 48mm; Neutral Socket Insert;    SPECIMEN: Culture tissue x 4 w/21 day hold  INDICATION(S):  Jacob Lawson is a 68 y.o. male who initially underwent left shoulder mini open rotator cuff repair with allograft interposition and biceps tenodesis with distal clavicle excision by me on 09/07/2022.  He was found to have postoperative C. acnes and staph epi infection leading to left shoulder irrigation and debridement with removal of all graft material on 11/20/2022.  He was treated with IV antibiotics for 4 weeks with a course of p.o. antibiotics afterwards.  He underwent attempted joint aspiration, but there was no notable joint effusion or fluid that could be aspirated.  ESR and CRP were normal.  Given massive, irreparable rotator cuff tear with increased presence of degenerative changes, we agreed to proceed with reverse shoulder arthroplasty after full discussion of risk, benefits, and alternatives.   OPERATIVE FINDINGS: massive rotator cuff tear (complete supraspinatus and infraspinatus, partial subscapularis); no sign of infection   OPERATIVE REPORT:   I identified Jacob Lawson  in the pre-operative holding area. Informed consent was obtained and the surgical site was marked. I reviewed the risks and benefits of the proposed surgical intervention and the patient wished to  proceed. An interscalene block with Exparel was administered by the Anesthesia team. The patient was transferred to the operative suite and general anesthesia was administered. The patient was placed in the beach chair position with the head of the bed elevated approximately 45 degrees. All down side pressure points were appropriately padded. Pre-op exam under anesthesia confirmed some stiffness and crepitus. Appropriate IV antibiotics were administered. The extremity was then prepped and draped in standard fashion. A time out was performed confirming the correct extremity, correct patient, and correct procedure.   We used the standard deltopectoral incision from the coracoid to ~12cm distal. We found the cephalic vein and took it laterally. Of note, it was nicked later in the case by retractors and was therefore tied and coagulated. We opened the deltopectoral interval widely and placed retractors under the CA ligament in the subacromial space and under the deltoid tendon at its insertion. We then abducted and internally rotated the arm and released the underlying bursa between these retractors, taking care not to damage the circumflex branch of the axillary nerve.   Next, we brought the arm back in adduction at slight forward flexion with external rotation. We opened the clavipectoral fascia lateral to the conjoint tendon. We gently palpated the axillary nerve and verified its position and continuity on both sides of the humerus with a Tug test. This test was repeated multiple times during the procedure for nerve localization and confirmed to be intact at the end of the case. We then cauterized the anterior humeral circumflex ("Three sisters") vessels. The arm was then internally rotated, we cut the falciform ligament at approximately 1 cm of the upper  portion of the pectoralis major insertion. Next we unroofed the bicipital groove. We proceeded with a soft tissue biceps tenodesis given the pathology  (significant thickening and tendinopathy proximally) of the tendon.  We performed a biceps tenodesis with two #2 TiCron sutures to the upper border of the pectoralis major. The proximal portion of the tendon was excised.   At this point, we could see that the supraspinatus and anterior infraspinatus was completely torn with a bald humeral head superiorly. The subscapularis also had a partial tear of the superior fibers. We performed a subscapularis peel using electrocautery to remove the anterior capsule and subscapularis off of the humeral head. We released the inferior capsule from the humerus all the way to the posterior band of the inferior glenohumeral ligament. When this was complete we gently dislocated the shoulder up into the wound. We removed any osteophytes and made our cut with the appropriate inclination in 20 degrees of retroversion  Of note, there is no sign of infection intraoperatively.  However, given prior C.acnes and staph epi infection, 4 additional culture samples were taken and sent for 21-day hold.  We then turned our attention back to the glenoid. The proximal humerus was retracted posteriorly. The anterior capsule was dissected free from the subscapularis. The anterior capsule was then excised, exposing the anterior glenoid. We then grasped the labrum and removed it circumferentially. During the glenoid exposure, the axillary nerve was protected the entire time.    The central guidepin was drilled in accordance with preoperative template using the 10 degree augment guide. Reaming was performed such that the augment was located ~12 o'clock position. A cannulated tap was placed over the guidepin. Central hole was measured and central screw was selected. The baseplate was inserted until appropriate contact was achieved with the glenoid. Central screw was placed and achieved excellent fixation such that the entire scapula rotated with further attempted seating of the baseplate. The  peripheral screws were drilled, measured, and placed. The wound was thoroughly irrigated.  Of note, we trialed with a 36 neutral glenosphere, but the joint could not be reduced so a 36-4 glenosphere was selected.   We then turned our attention back to the humerus. We sized for a standard shell prosthesis. We sequentially used larger diameter canal finders until we met appropriate resistance and sequential broaching was performed to this size listed above. Trial poly inserts were placed. The humerus was trialed and noted to have satisfactory stability, motion, and deltoid tension with above listed poly. The trial implants were removed. 3 drill holes were placed about the lesser tuberosity footprint and FiberWire sutures were passed through these holes for subscapularis repair. Next, the implant was placed with the appropriate retroversion. Stability was confirmed. We placed the actual poly insert. The humerus was reduced and motion, tension, and stability were satisfactory. A Hemovac drain was placed. The wound was thoroughly irrigated with normal saline and Irrisept solution.  Vancomycin powder was also placed.  Subscapularis was repaired with the previously passed #2 FiberWire sutures after passing them through the subscapularis.   We again verified the tension on the axillary nerve, appropriate range of motion, stability of the implant, and security of the subscapularis repair. We closed the deltopectoral interval deep to the cephalic vein with a running, 0-Vicryl suture. The skin was closed with 2-0 Vicryl, 4-0 Monocryl, and Dermabond. Sterile dressings were applied. A PolarCare unit and sling were placed. Patient was extubated, transferred to a stretcher bed and to the post anesthesia care unit  in stable condition.   Of note, assistance from a PA was essential to performing the surgery.  PA was present for the entire surgery.  PA assisted with patient positioning, retraction, instrumentation, and wound  closure. The surgery would have been more difficult and had longer operative time without PA assistance.    POSTOPERATIVE PLAN: The patient will be discharged home from the PACU. Operative arm to remain in sling at all times except RoM exercises and hygiene. Can perform pendulums, elbow/wrist/hand RoM exercises. Passive RoM allowed to 90 FF and 30 ER.  Resume baseline Eliquis on postoperative day 1. Plan for PT starting on POD #3-7. Patient to return to clinic in ~2 weeks for post-operative appointment.

## 2023-05-07 NOTE — Anesthesia Procedure Notes (Signed)
 Procedure Name: Intubation Date/Time: 05/07/2023 7:53 AM  Performed by: Carolynn Citrin, CRNAPre-anesthesia Checklist: Patient identified, Emergency Drugs available, Suction available, Patient being monitored and Timeout performed Patient Re-evaluated:Patient Re-evaluated prior to induction Oxygen Delivery Method: Circle system utilized Preoxygenation: Pre-oxygenation with 100% oxygen Induction Type: IV induction Ventilation: Mask ventilation without difficulty Laryngoscope Size: McGrath and 4 Grade View: Grade I Tube type: Oral Tube size: 7.0 mm Number of attempts: 1 Airway Equipment and Method: Patient positioned with wedge pillow, Stylet and Video-laryngoscopy Placement Confirmation: ETT inserted through vocal cords under direct vision, positive ETCO2 and breath sounds checked- equal and bilateral Secured at: 22 cm Tube secured with: Tape Dental Injury: Teeth and Oropharynx as per pre-operative assessment  Comments: Bougie on standby, was not needed. Pt was ramped effectively.

## 2023-05-08 ENCOUNTER — Encounter: Payer: Self-pay | Admitting: Orthopedic Surgery

## 2023-05-15 LAB — AEROBIC/ANAEROBIC CULTURE W GRAM STAIN (SURGICAL/DEEP WOUND): Gram Stain: NONE SEEN

## 2023-05-15 NOTE — Anesthesia Postprocedure Evaluation (Signed)
 Anesthesia Post Note  Patient: Jacob Lawson  Procedure(s) Performed: ARTHROPLASTY, SHOULDER, TOTAL, REVERSE (Left: Shoulder)  Patient location during evaluation: PACU Anesthesia Type: Regional Level of consciousness: awake and alert Pain management: pain level controlled Vital Signs Assessment: post-procedure vital signs reviewed and stable Respiratory status: spontaneous breathing, nonlabored ventilation, respiratory function stable and patient connected to nasal cannula oxygen Cardiovascular status: blood pressure returned to baseline and stable Postop Assessment: no apparent nausea or vomiting Anesthetic complications: no   No notable events documented.   Last Vitals:  Vitals:   05/07/23 1347 05/07/23 1523  BP: 114/79 121/78  Pulse: 78 77  Resp: 16 16  Temp: 36.8 C 36.7 C  SpO2: 93% 95%    Last Pain:  Vitals:   05/07/23 1347  TempSrc: Temporal  PainSc: 0-No pain                 Zula Hitch

## 2023-05-23 ENCOUNTER — Ambulatory Visit: Attending: Infectious Diseases | Admitting: Infectious Diseases

## 2023-05-23 ENCOUNTER — Encounter: Payer: Self-pay | Admitting: Infectious Diseases

## 2023-05-23 VITALS — BP 160/85 | HR 51 | Temp 97.9°F | Ht 72.0 in | Wt 238.0 lb

## 2023-05-23 DIAGNOSIS — I130 Hypertensive heart and chronic kidney disease with heart failure and stage 1 through stage 4 chronic kidney disease, or unspecified chronic kidney disease: Secondary | ICD-10-CM | POA: Insufficient documentation

## 2023-05-23 DIAGNOSIS — N400 Enlarged prostate without lower urinary tract symptoms: Secondary | ICD-10-CM | POA: Insufficient documentation

## 2023-05-23 DIAGNOSIS — E1122 Type 2 diabetes mellitus with diabetic chronic kidney disease: Secondary | ICD-10-CM | POA: Insufficient documentation

## 2023-05-23 DIAGNOSIS — M1711 Unilateral primary osteoarthritis, right knee: Secondary | ICD-10-CM | POA: Insufficient documentation

## 2023-05-23 DIAGNOSIS — F32A Depression, unspecified: Secondary | ICD-10-CM | POA: Insufficient documentation

## 2023-05-23 DIAGNOSIS — Z7985 Long-term (current) use of injectable non-insulin antidiabetic drugs: Secondary | ICD-10-CM | POA: Insufficient documentation

## 2023-05-23 DIAGNOSIS — Z9049 Acquired absence of other specified parts of digestive tract: Secondary | ICD-10-CM | POA: Diagnosis not present

## 2023-05-23 DIAGNOSIS — Z7984 Long term (current) use of oral hypoglycemic drugs: Secondary | ICD-10-CM | POA: Insufficient documentation

## 2023-05-23 DIAGNOSIS — Z79899 Other long term (current) drug therapy: Secondary | ICD-10-CM | POA: Diagnosis not present

## 2023-05-23 DIAGNOSIS — E039 Hypothyroidism, unspecified: Secondary | ICD-10-CM | POA: Diagnosis not present

## 2023-05-23 DIAGNOSIS — B9689 Other specified bacterial agents as the cause of diseases classified elsewhere: Secondary | ICD-10-CM | POA: Diagnosis not present

## 2023-05-23 DIAGNOSIS — G4733 Obstructive sleep apnea (adult) (pediatric): Secondary | ICD-10-CM | POA: Insufficient documentation

## 2023-05-23 DIAGNOSIS — I4891 Unspecified atrial fibrillation: Secondary | ICD-10-CM | POA: Insufficient documentation

## 2023-05-23 DIAGNOSIS — G4730 Sleep apnea, unspecified: Secondary | ICD-10-CM | POA: Insufficient documentation

## 2023-05-23 DIAGNOSIS — B957 Other staphylococcus as the cause of diseases classified elsewhere: Secondary | ICD-10-CM

## 2023-05-23 DIAGNOSIS — M00812 Arthritis due to other bacteria, left shoulder: Secondary | ICD-10-CM | POA: Diagnosis not present

## 2023-05-23 DIAGNOSIS — N183 Chronic kidney disease, stage 3 unspecified: Secondary | ICD-10-CM | POA: Diagnosis not present

## 2023-05-23 DIAGNOSIS — E785 Hyperlipidemia, unspecified: Secondary | ICD-10-CM | POA: Diagnosis not present

## 2023-05-23 DIAGNOSIS — I5032 Chronic diastolic (congestive) heart failure: Secondary | ICD-10-CM | POA: Diagnosis not present

## 2023-05-23 DIAGNOSIS — M009 Pyogenic arthritis, unspecified: Secondary | ICD-10-CM | POA: Diagnosis present

## 2023-05-23 NOTE — Progress Notes (Signed)
 NAME: Jacob Lawson  DOB: Apr 11, 1955  MRN: [id number]  Date/Time: 05/23/2023 9:37 AM  Follow up visit Subjective:   Folow up visit  for septic arthritis left shoulder - here with his wife. Consented for AI scribe  Jacob Lawson is a 68 y.o. male with a history of hypertension, hypothyroidism, sleep apnea, hyperlipidemia , h/o left foot abscess in 2020, DJD, rt knee medial menisceal tear repair, underwent extensive left rotator cuff repair and reconstruction on 09/07/22 which included biceps tenodesis,subacromial decompression.xtensive debridement of shoulder (glenohumeral and subacromial spaces) and distal clavicle excision He developed a small area of erythematous swelling  at the surgical site and was given bactrim on 10/23/22 and as there was worsening he was admitted and underwent I/D and extensive glenohumeral debridement and synovectomy and removal of any hardware on 11/20/22.  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 He took Amoxicillin  for a month after that HE was off antibiotics and waiting to have surgery There was no fluid to aspirate prior to surgery He uderwnt shoulder replacement on 05/07/23 1of 4 culture positive of r 1 colony of propinibacterium He is on PO amoxicllin 1 gram TID and doing well Left shoulder surgical site healed well Getting PT  Past Medical History:  Diagnosis Date   (HFpEF) heart failure with preserved ejection fraction (HCC)    Acquired hypothyroidism    Allergic rhinitis    Anginal pain (HCC)    Anticoagulated on apixaban     Aortic atherosclerosis (HCC)    Aortic root dilation (HCC)    Atrial fibrillation (HCC)    a.) CHA2DS2VASc = 5 (age, HFpEF, HTN, vascular disease, T2DM) as of 05/01/2023; b.) s/p DCCV 03/17/2021 --> 120 J x 1 --> NSR; c.) failed TEE with cardioversion 02/18/2023 --> became apneic with desat to 60s --> procedure aborted; d.) s/p DCCV 03/18/2023 -->  120 J x 1 -->; e.) c.) rate/rhythm maintained on oral amiodarone + carvedilol ; chronically anticoagulated with apixaban    Bacteremia due to Gram-negative bacteria 11/14/2018   a.) setting of puncture wound to LEFT foot   Bilateral leg edema    BPH (benign prostatic hyperplasia)    Carpal tunnel syndrome on both sides    Chronic lower back pain    CKD (chronic kidney disease), stage III (HCC)    Colloid LEFT thyroid  nodule    Depression    Difficult airway for intubation 11/20/2022   a.) Grade I view with McGrath X3 blade; small mouth opening, large tongue, crowded redundant tissue   DOE (dyspnea on exertion)    Ectatic abdominal aorta (HCC)    Erectile dysfunction    a.) on PDE5i (tadalafil)   Generalized anxiety disorder    GERD (gastroesophageal reflux disease)    Hepatic steatosis    Hyperlipidemia    Hypertension    Hypogonadism in male    a.) s/p Testim  implant in 2014. b.) uses exogenous testosterone    Hypothyroidism    Long term current use of amiodarone    Migraines    Obesity    Occipital neuralgia    OSA on CPAP    Osteoarthritis    Pulmonary nodules    Renal cyst, left    Right rotator cuff tear    Secondary polycythemia    a.) OSAH + exogenous testosterone  use + obesity   T2DM (type 2 diabetes mellitus) (HCC)    Vitiligo    Wears hearing aid  in both ears     Past Surgical History:  Procedure Laterality Date   APPENDECTOMY     ARTHOSCOPIC ROTAOR CUFF REPAIR Left 11/20/2022   Procedure: Left shoulder open irrigation and debridement, arthroscopic irrigation and debridement and extensive debridment;  Surgeon: Lorri Rota, MD;  Location: [facility];  Service: Orthopedics;  Laterality: Left;   CARDIOVERSION N/A 02/18/2023   Procedure: CARDIOVERSION;  Surgeon: Dorita Garter, MD;  Location: [facility];  Service: Cardiovascular;  Laterality: N/A;   CARDIOVERSION N/A 03/18/2023   Procedure: CARDIOVERSION;  Surgeon: Antonette Batters, MD;  Location: [facility];  Service:  Cardiovascular;  Laterality: N/A;   CARPAL TUNNEL RELEASE Right 11/2003   CARPAL TUNNEL RELEASE Left 11/19/2017   Procedure: LEFT OPEN CARPAL TUNNEL RELEASE;  Surgeon: Josephus Nida, MD;  Location: [facility];  Service: Orthopedics;  Laterality: Left;  Diabetic - oral meds Sleep Apnea   CHONDROPLASTY Right 03/22/2020   Procedure: CHONDROPLASTY;  Surgeon: Elner Hahn, MD;  Location: [facility];  Service: Orthopedics;  Laterality: Right;  ABRASION CHONDROPLASTY   COLONOSCOPY     2/91, 6/00, 01/04, 7/08, 10/10, 12/13, 02/26/17   COLONOSCOPY WITH PROPOFOL  N/A 03/07/2020   Procedure: COLONOSCOPY WITH PROPOFOL ;  Surgeon: Shane Darling, MD;  Location: [facility];  Service: Endoscopy;  Laterality: N/A;   ESOPHAGOGASTRODUODENOSCOPY     2/91, 1/95, 6/00, 1/04, 7/08, 10/10   ESOPHAGOGASTRODUODENOSCOPY  03/07/2020   Procedure: ESOPHAGOGASTRODUODENOSCOPY (EGD);  Surgeon: Shane Darling, MD;  Location: [facility];  Service: Endoscopy;;   EXCISION NEUROMA Left 06/2007   foot  had this done twice   FINGER SURGERY Bilateral    for congenital webbing   GANGLION CYST EXCISION Right 09/23/2019   Procedure: REMOVAL GANGLION OF FLEXOR SHEATH RIGHT LONG FINGER;  Surgeon: Elner Hahn, MD;  Location: [facility];  Service: Orthopedics;  Laterality: Right;   IRRIGATION AND DEBRIDEMENT FOOT Left 11/09/2018   Procedure: IRRIGATION AND DEBRIDEMENT FOOT;  Surgeon: Angel Barba, DPM;  Location: [facility];  Service: Podiatry;  Laterality: Left;   KNEE ARTHROSCOPY Right 03/22/2020   Procedure: RIGHT KNEE ARTHROSCOPY WITH PARTIAL MENISCECTOMY;  Surgeon: Elner Hahn, MD;  Location: [facility];  Service: Orthopedics;  Laterality: Right;   KNEE ARTHROSCOPY Right 05/25/2021   Procedure: RIGHT KNEE ARTHROSCOPY WITH DEBRIDEMENT AND PARTIAL MEDIAL MENISCECTOMY;  Surgeon: Elner Hahn, MD;  Location: [facility];  Service: Orthopedics;  Laterality: Right;   KNEE ARTHROSCOPY WITH MENISCAL REPAIR Right  03/22/2020   Procedure: KNEE ARTHROSCOPY WITH MENISCAL REPAIR;  Surgeon: Elner Hahn, MD;  Location: [facility];  Service: Orthopedics;  Laterality: Right;   LASIK Bilateral    MANDIBLE SURGERY     in late 30s, fo overbite   PROSTATE BIOPSY  12/2015   REVERSE SHOULDER ARTHROPLASTY Left 05/07/2023   Procedure: ARTHROPLASTY, SHOULDER, TOTAL, REVERSE;  Surgeon: Lorri Rota, MD;  Location: [facility];  Service: Orthopedics;  Laterality: Left;  Left reverse shoulder arthroplasty, biceps tenodesis   ROTATOR CUFF REPAIR Left 02/2005   SHOULDER ARTHROSCOPY WITH DEBRIDEMENT AND BICEP TENDON REPAIR Left 11/20/2022   Procedure: Left shoulder open irrigation and debridement, arthroscopic irrigation and debridement and extensive debridment;  Surgeon: Lorri Rota, MD;  Location: [facility];  Service: Orthopedics;  Laterality: Left;   SHOULDER ARTHROSCOPY WITH SUBACROMIAL DECOMPRESSION AND OPEN ROTATOR C Right 07/16/2019   Procedure: Right shoulder arthroscopy with debridement, decompression, possible rotator cuff repair, and probable biceps tenodesis;  Surgeon: Elner Hahn, MD;  Location: [facility];  Service:  Orthopedics;  Laterality: Right;   TEE WITHOUT CARDIOVERSION N/A 02/18/2023   Procedure: TRANSESOPHAGEAL ECHOCARDIOGRAM (TEE);  Surgeon: Dorita Garter, MD;  Location: [facility];  Service: Cardiovascular;  Laterality: N/A;   THYROIDECTOMY, PARTIAL     TONSILLECTOMY AND ADENOIDECTOMY      Social History   Socioeconomic History   Marital status: Married    Spouse name: Not on file   Number of children: Not on file   Years of education: Not on file   Highest education level: Not on file  Occupational History   Not on file  Tobacco Use   Smoking status: Never   Smokeless tobacco: Never  Vaping Use   Vaping status: Never Used  Substance and Sexual Activity   Alcohol use: Yes    Alcohol/week: 1.0 - 2.0 standard drink of alcohol    Types: 1 - 2 Cans of beer per week    Comment: occasional beer    Drug use: No   Sexual activity: Yes    Partners: Female    Birth control/protection: None  Other Topics Concern   Not on file  Social History Narrative   Not on file   Social Drivers of Health   Financial Resource Strain: Low Risk  (05/13/2023)   Received [facility]   Overall Financial Resource Strain (CARDIA)    Difficulty of Paying Living Expenses: Not hard at all  Food Insecurity: No Food Insecurity (05/13/2023)   Received from [facility]   Hunger Vital Sign    Worried About Running Out of Food in the Last Year: Never true    Ran Out of Food in the Last Year: Never true  Transportation Needs: No Transportation Needs (05/13/2023)   Received from [facility] - Transportation    In the past 12 months, has lack of transportation kept you from medical appointments or from getting medications?: No    Lack of Transportation (Non-Medical): No  Physical Activity: Not on file  Stress: Not on file  Social Connections: Not on file  Intimate Partner Violence: Not At Risk (04/11/2023)   Humiliation, Afraid, Rape, and Kick questionnaire    Fear of Current or Ex-Partner: No    Emotionally Abused: No    Physically Abused: No    Sexually Abused: No    Family History  Problem Relation Age of Onset   Dementia Mother    Pancreatic cancer Father    Prostate cancer Brother    Allergies  Allergen Reactions   Lamictal [Lamotrigine] Hives   Pravastatin Other (See Comments)    Muscle joint/pain.   Simvastatin Other (See Comments)    Muscle aches   Statins Other (See Comments)    Muscle/joint aches    I? Current Outpatient Medications  Medication Sig Dispense Refill   ACCU-CHEK GUIDE TEST test strip USE 1 STRIP ONCE DAILY     acetaminophen  (TYLENOL ) 500 MG tablet Take 2 tablets (1,000 mg total) by mouth every 8 (eight) hours. 90 tablet 2   amiodarone (PACERONE) 200 MG tablet Take 200 mg by mouth daily before lunch.      amoxicillin  (AMOXIL ) 500 MG capsule Take 1,000 mg by mouth.     apixaban  (ELIQUIS ) 5 MG TABS tablet Take 5 mg by mouth 2 (two) times daily.     busPIRone (BUSPAR) 7.5 MG tablet Take 7.5 mg by mouth 2 (two) times daily.     carvedilol  (COREG ) 6.25 MG tablet Take 6.25 mg by  mouth 2 (two) times daily with a meal.     Cholecalciferol  (VITAMIN D3) 125 MCG (5000 UT) TABS Take 5,000 Units by mouth in the morning.     Cyanocobalamin  (VITAMIN B-12 PO) Take 250 mcg by mouth in the morning.     empagliflozin (JARDIANCE) 10 MG TABS tablet Take 10 mg by mouth in the morning.     esomeprazole (NEXIUM) 20 MG capsule Take 20 mg by mouth daily as needed (acid reflux/indigestion.).     finasteride  (PROSCAR ) 5 MG tablet TAKE 1 TABLET BY MOUTH EVERY DAY 90 tablet 3   fluticasone  (FLONASE ) 50 MCG/ACT nasal spray Place 1-2 sprays into both nostrils daily as needed for allergies.      levothyroxine  (SYNTHROID ) 175 MCG tablet Take 175 mcg by mouth daily before breakfast.     loratadine  (CLARITIN ) 10 MG tablet Take 10 mg by mouth daily.     ondansetron  (ZOFRAN -ODT) 4 MG disintegrating tablet Take 1 tablet (4 mg total) by mouth every 8 (eight) hours as needed for nausea or vomiting. 20 tablet 0   rosuvastatin  (CRESTOR ) 5 MG tablet Take 5 mg by mouth every Monday, Wednesday, and Friday. At night     sertraline  (ZOLOFT ) 100 MG tablet Take 100 mg by mouth in the morning.     tadalafil (CIALIS) 20 MG tablet Take 20 mg by mouth daily as needed for erectile dysfunction.     tamsulosin  (FLOMAX ) 0.4 MG CAPS capsule TAKE 1 CAPSULE BY MOUTH EVERY DAY 90 capsule 1   Testosterone  Enanthate (XYOSTED ) 100 MG/0.5ML SOAJ Inject 0.5 mLs (100 mg total) into the skin every Wednesday. 2 mL 2   valsartan (DIOVAN) 80 MG tablet Take 80 mg by mouth in the morning.     zinc  sulfate 220 (50 Zn) MG capsule Take 220 mg by mouth in the morning.     MOUNJARO 2.5 MG/0.5ML Pen SMARTSIG:0.5 Milliliter(s) SUB-Q Once a Week     No current  facility-administered medications for this visit.     Abtx:  Anti-infectives (From admission, onward)    None       REVIEW OF SYSTEMS:  Const: negative fever, negative chills, negative weight loss Eyes: negative diplopia or visual changes, negative eye pain ENT: negative coryza, negative sore throat Resp: negative cough, hemoptysis, dyspnea Cards: negative for chest pain, has palpitations, lower extremity edema GU: negative for frequency, dysuria and hematuria GI: Negative for abdominal pain, diarrhea, bleeding, constipation Skin: negative for rash and pruritus Heme: negative for easy bruising and gum/nose bleeding ZO:XWRUEAV movt left shoulder, no pain or swelling Neurolo:negative for headaches, dizziness, vertigo, memory problems  Psych:  anxiety, depression  Endocrine: hypothyroidism Allergy/Immunology- as above Objective:  VITALS:  BP (!) 160/85   Pulse (!) 51   Temp 97.9 F (36.6 C) (Temporal)   Ht 6' (1.829 m)   Wt 238 lb (108 kg)   SpO2 98%   BMI 32.28 kg/m   PHYSICAL EXAM:  General: Alert, cooperative, no distress, appears stated age.  Head: Normocephalic, without obvious abnormality, atraumatic. Eyes: Conjunctivae clear, anicteric sclerae. Pupils are equal ENT Nares normal. No drainage or sinus tenderness. Lips, mucosa, and tongue normal. No Thrush Neck: Supple, symmetrical, no adenopathy, thyroid : non tender no carotid bruit and no JVD. Back: No CVA tenderness. Lungs: Clear to auscultation bilaterally. No Wheezing or Rhonchi. No rales. Heart: s1s2 Abdomen: not examined Extremities: left shoulder- surgical site healed very well   No erythema or discharge Skin: No rashes or lesions. Or bruising Lymph: Cervical,  supraclavicular normal. 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 Followed by oral antibiotics:Amoxicillin  TID and Doxycycline  100mg  BID X 4 weeks ( amoxicillin  will be 1 gram TID for 2 weeks followed by 500mg  TID for 2 weeks. -completed total of 8 weeks Feb 2025  Atrial fibrillation   His atrial fibrillation is managed with amiodarone and anticoagulation with Eliquis . Recent cardioversion was successful, and he reports feeling well.  Hypothyroidism   His hypothyroidism is managed with Synthroid .  Depression   His depression is managed with sertraline  (Zoloft ).  Benign prostatic hyperplasia   His benign prostatic hyperplasia is managed with tamsulosin  (Flomax ) and finasteride  (Proscar ).  ?_______ Discussed with patient,in detail Follow up in July Meanwhile he may see his PCP who is ID

## 2023-05-23 NOTE — Patient Instructions (Signed)
 Today, we discussed your persistent Propionibacterium acnes infection following your shoulder replacement surgery. We also reviewed your management plans for type 2 diabetes, hypertension, hyperlipidemia, atrial fibrillation, hypothyroidism, depression, and benign prostatic hyperplasia. Your recent lab results were normal, and you reported feeling well post-cardioversion.  YOUR PLAN:  -PROPIONIBACTERIUM ACNES INFECTION: Propionibacterium acnes is a type of bacteria that can live in hair follicles and sebaceous glands, making it difficult to completely eliminate. You have a low bacterial load with no pain or inflammation. Continue taking amoxicillin  1 gram three times daily for 8 weeks, then reduce to 500 mg three times daily for a prolonged period. We will monitor your inflammatory markers with a blood test in 4 weeks.  Schedule a follow-up appointment in July.  -TYPE 2 DIABETES MELLITUS: Type 2 diabetes is a condition where your body does not use insulin  properly, leading to high blood sugar levels. Your diabetes is managed with Jardiance, and you are starting Mounjaro today. Your recent glucose level was 163 mg/dL.  -HYPERTENSION: Hypertension is high blood pressure. Your blood pressure is managed with valsartan (Diovan) and carvedilol .  -HYPERLIPIDEMIA: Hyperlipidemia is having high levels of fats (lipids) in your blood. Your condition is managed with rosuvastatin  (Crestor ).  -ATRIAL FIBRILLATION: Atrial fibrillation is an irregular and often rapid heart rate. Your condition is managed with amiodarone and anticoagulation with Eliquis . Your recent cardioversion was successful, and you are feeling well.  -HYPOTHYROIDISM: Hypothyroidism is when your thyroid  gland does not produce enough thyroid  hormone. Your condition is managed with Synthroid .  -DEPRESSION: Depression is a mood disorder that causes persistent feelings of sadness and loss of interest. Your condition is managed with sertraline   (Zoloft ).  -BENIGN PROSTATIC HYPERPLASIA: Benign prostatic hyperplasia is an enlarged prostate gland. Your condition is managed with tamsulosin  (Flomax ) and finasteride  (Proscar ).  INSTRUCTIONS:  Monitor your inflammatory markers with a blood test in 4 weeks. Schedule a follow-up appointment in July.I will discuss with Dr.Fitzgerald

## 2023-05-28 LAB — AEROBIC/ANAEROBIC CULTURE W GRAM STAIN (SURGICAL/DEEP WOUND)
Culture: NO GROWTH
Culture: NO GROWTH
Culture: NO GROWTH
Gram Stain: NONE SEEN

## 2023-05-29 ENCOUNTER — Inpatient Hospital Stay

## 2023-05-29 ENCOUNTER — Inpatient Hospital Stay: Attending: Oncology | Admitting: Oncology

## 2023-05-29 ENCOUNTER — Encounter: Payer: Self-pay | Admitting: Oncology

## 2023-05-29 VITALS — BP 123/73 | HR 53 | Temp 96.9°F | Resp 18 | Wt 245.0 lb

## 2023-05-29 DIAGNOSIS — Z8639 Personal history of other endocrine, nutritional and metabolic disease: Secondary | ICD-10-CM | POA: Insufficient documentation

## 2023-05-29 DIAGNOSIS — Z79899 Other long term (current) drug therapy: Secondary | ICD-10-CM | POA: Insufficient documentation

## 2023-05-29 DIAGNOSIS — D751 Secondary polycythemia: Secondary | ICD-10-CM | POA: Diagnosis not present

## 2023-05-29 NOTE — Progress Notes (Unsigned)
 Patient is wanting to see if he can just donate his blood instead of phlebotomy? He had his complete shoulder left surgery about 4 weeks ago.

## 2023-05-29 NOTE — Progress Notes (Unsigned)
 [facility]  [phone] [phone]  ID: Jacob Lawson OB: Sep 07, 1955  MR#: [id number]  AOZ#:[id number]  Patient Care Team: Eartha Gold, MD as PCP - General (Infectious Diseases)  CHIEF COMPLAINT: Secondary polycythemia.  INTERVAL HISTORY: Patient returns to clinic today for further evaluation and consideration of phlebotomy.  He recently underwent shoulder replacement surgery and tolerated the procedure well.  He also reports he has discontinued testosterone .  He currently feels well and is asymptomatic. He has no neurologic complaints.  He denies any recent fevers or illnesses.  He has a good appetite and denies weight.  He has no chest pain, shortness of breath, cough, or hemoptysis.  He denies any nausea, vomiting, constipation, or diarrhea.  He has no urinary complaints.  Patient offers no further specific complaints today.  REVIEW OF SYSTEMS:   Review of Systems  Constitutional: Negative.  Negative for fever, malaise/fatigue and weight loss.  Respiratory: Negative.  Negative for cough and shortness of breath.   Cardiovascular: Negative.  Negative for chest pain and leg swelling.  Gastrointestinal: Negative.  Negative for abdominal pain.  Genitourinary: Negative.  Negative for dysuria.  Musculoskeletal: Negative.  Negative for back pain.  Skin: Negative.  Negative for rash.  Neurological: Negative.  Negative for dizziness, focal weakness, weakness and headaches.  Psychiatric/Behavioral: Negative.  The patient is not nervous/anxious.     As per HPI. Otherwise, a complete review of systems is negative.  PAST MEDICAL HISTORY: Past Medical History:  Diagnosis Date   (HFpEF) heart failure with preserved ejection fraction (HCC)    Acquired hypothyroidism    Allergic rhinitis    Anginal pain (HCC)    Anticoagulated on apixaban     Aortic atherosclerosis (HCC)    Aortic root dilation (HCC)    Atrial fibrillation (HCC)    a.)  CHA2DS2VASc = 5 (age, HFpEF, HTN, vascular disease, T2DM) as of 05/01/2023; b.) s/p DCCV 03/17/2021 --> 120 J x 1 --> NSR; c.) failed TEE with cardioversion 02/18/2023 --> became apneic with desat to 60s --> procedure aborted; d.) s/p DCCV 03/18/2023 --> 120 J x 1 -->; e.) c.) rate/rhythm maintained on oral amiodarone + carvedilol ; chronically anticoagulated with apixaban    Bacteremia due to Gram-negative bacteria 11/14/2018   a.) setting of puncture wound to LEFT foot   Bilateral leg edema    BPH (benign prostatic hyperplasia)    Carpal tunnel syndrome on both sides    Chronic lower back pain    CKD (chronic kidney disease), stage III (HCC)    Colloid LEFT thyroid  nodule    Depression    Difficult airway for intubation 11/20/2022   a.) Grade I view with McGrath X3 blade; small mouth opening, large tongue, crowded redundant tissue   DOE (dyspnea on exertion)    Ectatic abdominal aorta (HCC)    Erectile dysfunction    a.) on PDE5i (tadalafil)   Generalized anxiety disorder    GERD (gastroesophageal reflux disease)    Hepatic steatosis    Hyperlipidemia    Hypertension    Hypogonadism in male    a.) s/p Testim  implant in 2014. b.) uses exogenous testosterone    Hypothyroidism    Long term current use of amiodarone    Migraines    Obesity    Occipital neuralgia    OSA on CPAP    Osteoarthritis    Pulmonary nodules    Renal cyst, left    Right rotator cuff tear    Secondary polycythemia  a.) OSAH + exogenous testosterone  use + obesity   T2DM (type 2 diabetes mellitus) (HCC)    Vitiligo    Wears hearing aid in both ears     PAST SURGICAL HISTORY: Past Surgical History:  Procedure Laterality Date   APPENDECTOMY     ARTHOSCOPIC ROTAOR CUFF REPAIR Left 11/20/2022   Procedure: Left shoulder open irrigation and debridement, arthroscopic irrigation and debridement and extensive debridment;  Surgeon: Lorri Rota, MD;  Location: [facility];  Service: Orthopedics;  Laterality: Left;    CARDIOVERSION N/A 02/18/2023   Procedure: CARDIOVERSION;  Surgeon: Dorita Garter, MD;  Location: [facility];  Service: Cardiovascular;  Laterality: N/A;   CARDIOVERSION N/A 03/18/2023   Procedure: CARDIOVERSION;  Surgeon: Antonette Batters, MD;  Location: [facility];  Service: Cardiovascular;  Laterality: N/A;   CARPAL TUNNEL RELEASE Right 11/2003   CARPAL TUNNEL RELEASE Left 11/19/2017   Procedure: LEFT OPEN CARPAL TUNNEL RELEASE;  Surgeon: Josephus Nida, MD;  Location: [facility];  Service: Orthopedics;  Laterality: Left;  Diabetic - oral meds Sleep Apnea   CHONDROPLASTY Right 03/22/2020   Procedure: CHONDROPLASTY;  Surgeon: Elner Hahn, MD;  Location: [facility];  Service: Orthopedics;  Laterality: Right;  ABRASION CHONDROPLASTY   COLONOSCOPY     2/91, 6/00, 01/04, 7/08, 10/10, 12/13, 02/26/17   COLONOSCOPY WITH PROPOFOL  N/A 03/07/2020   Procedure: COLONOSCOPY WITH PROPOFOL ;  Surgeon: Shane Darling, MD;  Location: [facility];  Service: Endoscopy;  Laterality: N/A;   ESOPHAGOGASTRODUODENOSCOPY     2/91, 1/95, 6/00, 1/04, 7/08, 10/10   ESOPHAGOGASTRODUODENOSCOPY  03/07/2020   Procedure: ESOPHAGOGASTRODUODENOSCOPY (EGD);  Surgeon: Shane Darling, MD;  Location: [facility];  Service: Endoscopy;;   EXCISION NEUROMA Left 06/2007   foot  had this done twice   FINGER SURGERY Bilateral    for congenital webbing   GANGLION CYST EXCISION Right 09/23/2019   Procedure: REMOVAL GANGLION OF FLEXOR SHEATH RIGHT LONG FINGER;  Surgeon: Elner Hahn, MD;  Location: [facility];  Service: Orthopedics;  Laterality: Right;   IRRIGATION AND DEBRIDEMENT FOOT Left 11/09/2018   Procedure: IRRIGATION AND DEBRIDEMENT FOOT;  Surgeon: Angel Barba, DPM;  Location: [facility];  Service: Podiatry;  Laterality: Left;   KNEE ARTHROSCOPY Right 03/22/2020   Procedure: RIGHT KNEE ARTHROSCOPY WITH PARTIAL MENISCECTOMY;  Surgeon: Elner Hahn, MD;  Location: [facility];  Service: Orthopedics;   Laterality: Right;   KNEE ARTHROSCOPY Right 05/25/2021   Procedure: RIGHT KNEE ARTHROSCOPY WITH DEBRIDEMENT AND PARTIAL MEDIAL MENISCECTOMY;  Surgeon: Elner Hahn, MD;  Location: [facility];  Service: Orthopedics;  Laterality: Right;   KNEE ARTHROSCOPY WITH MENISCAL REPAIR Right 03/22/2020   Procedure: KNEE ARTHROSCOPY WITH MENISCAL REPAIR;  Surgeon: Elner Hahn, MD;  Location: [facility];  Service: Orthopedics;  Laterality: Right;   LASIK Bilateral    MANDIBLE SURGERY     in late 30s, fo overbite   PROSTATE BIOPSY  12/2015   REVERSE SHOULDER ARTHROPLASTY Left 05/07/2023   Procedure: ARTHROPLASTY, SHOULDER, TOTAL, REVERSE;  Surgeon: Lorri Rota, MD;  Location: [facility];  Service: Orthopedics;  Laterality: Left;  Left reverse shoulder arthroplasty, biceps tenodesis   ROTATOR CUFF REPAIR Left 02/2005   SHOULDER ARTHROSCOPY WITH DEBRIDEMENT AND BICEP TENDON REPAIR Left 11/20/2022   Procedure: Left shoulder open irrigation and debridement, arthroscopic irrigation and debridement and extensive debridment;  Surgeon: Lorri Rota, MD;  Location: [facility];  Service: Orthopedics;  Laterality: Left;   SHOULDER ARTHROSCOPY WITH SUBACROMIAL DECOMPRESSION AND OPEN ROTATOR C Right 07/16/2019  Procedure: Right shoulder arthroscopy with debridement, decompression, possible rotator cuff repair, and probable biceps tenodesis;  Surgeon: Elner Hahn, MD;  Location: [facility];  Service: Orthopedics;  Laterality: Right;   TEE WITHOUT CARDIOVERSION N/A 02/18/2023   Procedure: TRANSESOPHAGEAL ECHOCARDIOGRAM (TEE);  Surgeon: Dorita Garter, MD;  Location: [facility];  Service: Cardiovascular;  Laterality: N/A;   THYROIDECTOMY, PARTIAL     TONSILLECTOMY AND ADENOIDECTOMY      FAMILY HISTORY: Family History  Problem Relation Age of Onset   Dementia Mother    Pancreatic cancer Father    Prostate cancer Brother     ADVANCED DIRECTIVES (Y/N):  N  HEALTH MAINTENANCE: Social History   Tobacco Use   Smoking  status: Never   Smokeless tobacco: Never  Vaping Use   Vaping status: Never Used  Substance Use Topics   Alcohol use: Yes    Alcohol/week: 1.0 - 2.0 standard drink of alcohol    Types: 1 - 2 Cans of beer per week    Comment: occasional beer   Drug use: No     Colonoscopy:  PAP:  Bone density:  Lipid panel:  Allergies  Allergen Reactions   Lamictal [Lamotrigine] Hives   Pravastatin Other (See Comments)    Muscle joint/pain.   Simvastatin Other (See Comments)    Muscle aches   Statins Other (See Comments)    Muscle/joint aches     Current Outpatient Medications  Medication Sig Dispense Refill   ACCU-CHEK GUIDE TEST test strip USE 1 STRIP ONCE DAILY     acetaminophen  (TYLENOL ) 500 MG tablet Take 2 tablets (1,000 mg total) by mouth every 8 (eight) hours. 90 tablet 2   amiodarone (PACERONE) 200 MG tablet Take 200 mg by mouth daily before lunch.     amoxicillin  (AMOXIL ) 500 MG capsule Take 1,000 mg by mouth.     apixaban  (ELIQUIS ) 5 MG TABS tablet Take 5 mg by mouth 2 (two) times daily.     busPIRone (BUSPAR) 7.5 MG tablet Take 7.5 mg by mouth 2 (two) times daily.     carvedilol  (COREG ) 6.25 MG tablet Take 6.25 mg by mouth 2 (two) times daily with a meal.     Cholecalciferol  (VITAMIN D3) 125 MCG (5000 UT) TABS Take 5,000 Units by mouth in the morning.     Cyanocobalamin  (VITAMIN B-12 PO) Take 250 mcg by mouth in the morning.     empagliflozin (JARDIANCE) 10 MG TABS tablet Take 10 mg by mouth in the morning.     esomeprazole (NEXIUM) 20 MG capsule Take 20 mg by mouth daily as needed (acid reflux/indigestion.).     finasteride  (PROSCAR ) 5 MG tablet TAKE 1 TABLET BY MOUTH EVERY DAY 90 tablet 3   fluticasone  (FLONASE ) 50 MCG/ACT nasal spray Place 1-2 sprays into both nostrils daily as needed for allergies.      levothyroxine  (SYNTHROID ) 175 MCG tablet Take 175 mcg by mouth daily before breakfast.     loratadine  (CLARITIN ) 10 MG tablet Take 10 mg by mouth daily.     MOUNJARO 2.5  MG/0.5ML Pen SMARTSIG:0.5 Milliliter(s) SUB-Q Once a Week     ondansetron  (ZOFRAN -ODT) 4 MG disintegrating tablet Take 1 tablet (4 mg total) by mouth every 8 (eight) hours as needed for nausea or vomiting. 20 tablet 0   rosuvastatin  (CRESTOR ) 5 MG tablet Take 5 mg by mouth every Monday, Wednesday, and Friday. At night     sertraline  (ZOLOFT ) 100 MG tablet Take 100 mg by mouth in the morning.  tadalafil (CIALIS) 20 MG tablet Take 20 mg by mouth daily as needed for erectile dysfunction.     tamsulosin  (FLOMAX ) 0.4 MG CAPS capsule TAKE 1 CAPSULE BY MOUTH EVERY DAY 90 capsule 1   Testosterone  Enanthate (XYOSTED ) 100 MG/0.5ML SOAJ Inject 0.5 mLs (100 mg total) into the skin every Wednesday. 2 mL 2   valsartan (DIOVAN) 80 MG tablet Take 80 mg by mouth in the morning.     zinc  sulfate 220 (50 Zn) MG capsule Take 220 mg by mouth in the morning.     No current facility-administered medications for this visit.    OBJECTIVE: Vitals:   05/29/23 1345  BP: 123/73  Pulse: (!) 53  Resp: 18  Temp: (!) 96.9 F (36.1 C)  SpO2: 97%     Body mass index is 33.23 kg/m.    ECOG FS:0 - Asymptomatic  General: Well-developed, well-nourished, no acute distress. Eyes: Pink conjunctiva, anicteric sclera. HEENT: Normocephalic, moist mucous membranes. Lungs: No audible wheezing or coughing. Heart: Regular rate and rhythm. Abdomen: Soft, nontender, no obvious distention. Musculoskeletal: No edema, cyanosis, or clubbing. Neuro: Alert, answering all questions appropriately. Cranial nerves grossly intact. Skin: No rashes or petechiae noted. Psych: Normal affect.   LAB RESULTS:  Lab Results  Component Value Date   NA 137 12/25/2022   K 3.7 12/25/2022   CL 100 12/25/2022   CO2 27 12/25/2022   GLUCOSE 122 (H) 12/25/2022   BUN 26 (H) 12/25/2022   CREATININE 1.47 (H) 12/25/2022   CALCIUM  9.2 12/25/2022   PROT 7.3 12/25/2022   ALBUMIN 4.0 12/25/2022   AST 25 12/25/2022   ALT 26 12/25/2022   ALKPHOS 64  12/25/2022   BILITOT 0.8 12/25/2022   GFRNONAA 52 (L) 12/25/2022   GFRAA >60 07/10/2019    Lab Results  Component Value Date   WBC 5.7 04/11/2023   NEUTROABS 4.1 12/25/2022   HGB 16.8 04/11/2023   HCT 52.3 (H) 04/11/2023   MCV 79.6 (L) 04/11/2023   PLT 150 04/11/2023     STUDIES: DG Shoulder 1V Left Result Date: 05/07/2023 CLINICAL DATA:  Status post reverse shoulder arthroplasty status post reverse total shoulder arthroplasty. EXAM: LEFT SHOULDER COMPARISON:  None Available. FINDINGS: Reverse left shoulder arthroplasty in expected alignment. No periprosthetic lucency or fracture. Recent postsurgical change includes air and edema in the joint space and soft tissues. IMPRESSION: Reverse left shoulder arthroplasty without immediate postoperative complication. Electronically Signed   By: Chadwick Colonel M.D.   On: 05/07/2023 21:05   US  OR NERVE BLOCK-IMAGE ONLY [facility]) Result Date: 05/07/2023 There is no interpretation for this exam.  This order is for images obtained during a surgical procedure.  Please See "Surgeries" Tab for more information regarding the procedure.    ASSESSMENT: Secondary polycythemia.  PLAN:    Secondary polycythemia: Resolved.  Patient's most recent hemoglobin postoperatively was actually mildly decreased at 13.4.  Previously, all of his other laboratory work including iron stores, erythropoietin  level, and JAK2 mutation with reflex were either negative or within normal limits.  No intervention is needed this time.  Patient does not require phlebotomy.  He expressed interest in donating blood on a regular basis and has been instructed to either go to the [employer] or [facility] 3 or 4 times per year.  No follow-up has been scheduled at this time.  Please refer patient back if there are any questions or concerns. Low testosterone : Patient reports he is no longer taking testosterone .  Okay to restart treatment from  a hematology standpoint.  Blood donations as  above.  I spent a total of 20 minutes reviewing chart data, face-to-face evaluation with the patient, counseling and coordination of care as detailed above.    Patient expressed understanding and was in agreement with this plan. He also understands that He can call clinic at any time with any questions, concerns, or complaints.     Shellie Dials, MD   05/30/2023 1:40 PM

## 2023-05-29 NOTE — Progress Notes (Signed)
No phlebotomy today.

## 2023-07-02 ENCOUNTER — Encounter: Payer: Self-pay | Admitting: Urology

## 2023-07-04 ENCOUNTER — Other Ambulatory Visit: Payer: Self-pay | Admitting: *Deleted

## 2023-07-04 DIAGNOSIS — E291 Testicular hypofunction: Secondary | ICD-10-CM

## 2023-07-04 MED ORDER — XYOSTED 100 MG/0.5ML ~~LOC~~ SOAJ: 100.0000 mg | SUBCUTANEOUS | 2 refills | Status: DC

## 2023-07-04 NOTE — Addendum Note (Signed)
 Addended by: Westley Hammers on: 07/04/2023 09:14 AM   Modules accepted: Orders

## 2023-07-30 ENCOUNTER — Other Ambulatory Visit
Admission: RE | Admit: 2023-07-30 | Discharge: 2023-07-30 | Disposition: A | Discharge status: Home / Self Care | Source: Ambulatory Visit | Attending: Infectious Diseases | Admitting: Infectious Diseases

## 2023-07-30 ENCOUNTER — Encounter: Payer: Self-pay | Admitting: Infectious Diseases

## 2023-07-30 ENCOUNTER — Ambulatory Visit: Attending: Infectious Diseases | Admitting: Infectious Diseases

## 2023-07-30 VITALS — BP 152/92 | HR 52 | Temp 98.2°F | Ht 72.0 in | Wt 236.0 lb

## 2023-07-30 DIAGNOSIS — T8459XD Infection and inflammatory reaction due to other internal joint prosthesis, subsequent encounter: Secondary | ICD-10-CM | POA: Diagnosis not present

## 2023-07-30 DIAGNOSIS — M1990 Unspecified osteoarthritis, unspecified site: Secondary | ICD-10-CM | POA: Insufficient documentation

## 2023-07-30 DIAGNOSIS — G4730 Sleep apnea, unspecified: Secondary | ICD-10-CM | POA: Insufficient documentation

## 2023-07-30 DIAGNOSIS — E039 Hypothyroidism, unspecified: Secondary | ICD-10-CM | POA: Diagnosis not present

## 2023-07-30 DIAGNOSIS — I10 Essential (primary) hypertension: Secondary | ICD-10-CM | POA: Diagnosis not present

## 2023-07-30 DIAGNOSIS — Z7901 Long term (current) use of anticoagulants: Secondary | ICD-10-CM | POA: Insufficient documentation

## 2023-07-30 DIAGNOSIS — B957 Other staphylococcus as the cause of diseases classified elsewhere: Secondary | ICD-10-CM | POA: Diagnosis not present

## 2023-07-30 DIAGNOSIS — M00012 Staphylococcal arthritis, left shoulder: Secondary | ICD-10-CM | POA: Insufficient documentation

## 2023-07-30 DIAGNOSIS — F32A Depression, unspecified: Secondary | ICD-10-CM | POA: Insufficient documentation

## 2023-07-30 DIAGNOSIS — N400 Enlarged prostate without lower urinary tract symptoms: Secondary | ICD-10-CM | POA: Insufficient documentation

## 2023-07-30 DIAGNOSIS — E785 Hyperlipidemia, unspecified: Secondary | ICD-10-CM | POA: Diagnosis not present

## 2023-07-30 DIAGNOSIS — T8450XD Infection and inflammatory reaction due to unspecified internal joint prosthesis, subsequent encounter: Secondary | ICD-10-CM | POA: Diagnosis present

## 2023-07-30 DIAGNOSIS — Z79890 Hormone replacement therapy (postmenopausal): Secondary | ICD-10-CM | POA: Diagnosis not present

## 2023-07-30 DIAGNOSIS — I4891 Unspecified atrial fibrillation: Secondary | ICD-10-CM | POA: Diagnosis not present

## 2023-07-30 DIAGNOSIS — M00812 Arthritis due to other bacteria, left shoulder: Secondary | ICD-10-CM | POA: Diagnosis present

## 2023-07-30 DIAGNOSIS — B9689 Other specified bacterial agents as the cause of diseases classified elsewhere: Secondary | ICD-10-CM

## 2023-07-30 LAB — COMPREHENSIVE METABOLIC PANEL WITH GFR
ALT: 24 U/L (ref 0–44)
AST: 24 U/L (ref 15–41)
Albumin: 3.8 g/dL (ref 3.5–5.0)
Alkaline Phosphatase: 64 U/L (ref 38–126)
Anion gap: 10 (ref 5–15)
BUN: 28 mg/dL — ABNORMAL HIGH (ref 8–23)
CO2: 24 mmol/L (ref 22–32)
Calcium: 9.3 mg/dL (ref 8.9–10.3)
Chloride: 105 mmol/L (ref 98–111)
Creatinine, Ser: 1.33 mg/dL — ABNORMAL HIGH (ref 0.61–1.24)
GFR, Estimated: 58 mL/min — ABNORMAL LOW (ref >60)
Glucose, Bld: 104 mg/dL — ABNORMAL HIGH (ref 70–99)
Potassium: 3.9 mmol/L (ref 3.5–5.1)
Sodium: 139 mmol/L (ref 135–145)
Total Bilirubin: 0.7 mg/dL (ref 0.0–1.2)
Total Protein: 7.2 g/dL (ref 6.5–8.1)

## 2023-07-30 LAB — CBC WITH DIFFERENTIAL/PLATELET
Abs Immature Granulocytes: 0.07 K/uL (ref 0.00–0.07)
Basophils Absolute: 0.1 K/uL (ref 0.0–0.1)
Basophils Relative: 2 %
Eosinophils Absolute: 0.2 K/uL (ref 0.0–0.5)
Eosinophils Relative: 4 %
HCT: 44.1 % (ref 39.0–52.0)
Hemoglobin: 14.6 g/dL (ref 13.0–17.0)
Immature Granulocytes: 1 %
Lymphocytes Relative: 26 %
Lymphs Abs: 1.5 K/uL (ref 0.7–4.0)
MCH: 28.6 pg (ref 26.0–34.0)
MCHC: 33.1 g/dL (ref 30.0–36.0)
MCV: 86.5 fL (ref 80.0–100.0)
Monocytes Absolute: 0.7 K/uL (ref 0.1–1.0)
Monocytes Relative: 11 %
Neutro Abs: 3.2 K/uL (ref 1.7–7.7)
Neutrophils Relative %: 56 %
Platelets: 166 K/uL (ref 150–400)
RBC: 5.1 MIL/uL (ref 4.22–5.81)
RDW: 14.2 % (ref 11.5–15.5)
WBC: 5.8 K/uL (ref 4.0–10.5)
nRBC: 0 % (ref 0.0–0.2)

## 2023-07-30 LAB — SEDIMENTATION RATE: Sed Rate: 8 mm/h (ref 0–20)

## 2023-07-30 LAB — C-REACTIVE PROTEIN: CRP: 0.8 mg/dL (ref <1.0)

## 2023-07-30 MED ORDER — AMOXICILLIN 500 MG PO CAPS: 500.0000 mg | ORAL_CAPSULE | Freq: Three times a day (TID) | ORAL | 2 refills | Status: DC

## 2023-07-30 NOTE — Progress Notes (Signed)
 NAME: Jacob Lawson  DOB: May 23, 1955  MRN: [id number]  Date/Time: 07/30/2023 10:17 AM  Follow up visit Subjective:   Folow up visit  for septic arthritis left shoulder -   Jacob Lawson is a 68 y.o. 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 Complicated by  small area of erythematous swelling  at the surgical site and was given bactrim on 10/23/22 and as there was worsening he was admitted and underwent I/D and extensive glenohumeral debridement and synovectomy and removal of any hardware on 11/20/22.  Culture positive for propiniobacterium and staph epi- He completed ceftriaxone  and IV dapto for 4 weeks until 11/30 . He took Amoxicillin  for a month after that HE was off antibiotics and waiting to have surgery There was no fluid to aspirate prior to surgery He underwent shoulder replacement on 05/07/23 1of 4 culture positive of r 1 colony of propinibacterium on PO amoxicllin 1 gram TID and doing well Left shoulder surgical site healed well Shoulder movts better  Past Medical History:  Diagnosis Date   (HFpEF) heart failure with preserved ejection fraction (HCC)    Acquired hypothyroidism    Allergic rhinitis    Anginal pain (HCC)    Anticoagulated on apixaban     Aortic atherosclerosis (HCC)    Aortic root dilation (HCC)    Atrial fibrillation (HCC)    a.) CHA2DS2VASc = 5 (age, HFpEF, HTN, vascular disease, T2DM) as of 05/01/2023; b.) s/p DCCV 03/17/2021 --> 120 J x 1 --> NSR; c.) failed TEE with cardioversion 02/18/2023 --> became apneic with desat to 60s --> procedure aborted; d.) s/p DCCV  03/18/2023 --> 120 J x 1 -->; e.) c.) rate/rhythm maintained on oral amiodarone + carvedilol ; chronically anticoagulated with apixaban    Bacteremia due to Gram-negative bacteria 11/14/2018   a.) setting of puncture wound to LEFT foot   Bilateral leg edema    BPH (benign prostatic hyperplasia)    Carpal tunnel syndrome on both sides    Chronic lower back pain    CKD (chronic kidney disease), stage III (HCC)    Colloid LEFT thyroid  nodule    Depression    Difficult airway for intubation 11/20/2022   a.) Grade I view with McGrath X3 blade; small mouth opening, large tongue, crowded redundant tissue   DOE (dyspnea on exertion)    Ectatic abdominal aorta (HCC)    Erectile dysfunction    a.) on PDE5i (tadalafil)   Generalized anxiety disorder    GERD (gastroesophageal reflux disease)    Hepatic steatosis    Hyperlipidemia    Hypertension    Hypogonadism in male    a.) s/p Testim  implant in 2014. b.) uses exogenous testosterone    Hypothyroidism    Long term current use of amiodarone    Migraines    Obesity    Occipital neuralgia    OSA on CPAP    Osteoarthritis    Pulmonary nodules    Renal cyst, left    Right rotator cuff tear    Secondary polycythemia    a.) OSAH + exogenous testosterone  use + obesity   T2DM (type 2 diabetes mellitus) (HCC)    Vitiligo    Wears hearing aid in both ears  Past Surgical History:  Procedure Laterality Date   APPENDECTOMY     ARTHOSCOPIC ROTAOR CUFF REPAIR Left 11/20/2022   Procedure: Left shoulder open irrigation and debridement, arthroscopic irrigation and debridement and extensive debridment;  Surgeon: Tobie Priest, MD;  Location: [facility];  Service: Orthopedics;  Laterality: Left;   CARDIOVERSION N/A 02/18/2023   Procedure: CARDIOVERSION;  Surgeon: Hilarie Rocher, MD;  Location: [facility];  Service: Cardiovascular;  Laterality: N/A;   CARDIOVERSION N/A 03/18/2023   Procedure: CARDIOVERSION;  Surgeon: Florencio Cara BIRCH, MD;  Location: [facility]  [facility];  Service: Cardiovascular;  Laterality: N/A;   CARPAL TUNNEL RELEASE Right 11/2003   CARPAL TUNNEL RELEASE Left 11/19/2017   Procedure: LEFT OPEN CARPAL TUNNEL RELEASE;  Surgeon: Maryl Barters, MD;  Location: [facility];  Service: Orthopedics;  Laterality: Left;  Diabetic - oral meds Sleep Apnea   CHONDROPLASTY Right 03/22/2020   Procedure: CHONDROPLASTY;  Surgeon: Edie Norleen PARAS, MD;  Location: [facility];  Service: Orthopedics;  Laterality: Right;  ABRASION CHONDROPLASTY   COLONOSCOPY     2/91, 6/00, 01/04, 7/08, 10/10, 12/13, 02/26/17   COLONOSCOPY WITH PROPOFOL  N/A 03/07/2020   Procedure: COLONOSCOPY WITH PROPOFOL ;  Surgeon: Maryruth Ole DASEN, MD;  Location: [facility] ENDOSCOPY;  Service: Endoscopy;  Laterality: N/A;   ESOPHAGOGASTRODUODENOSCOPY     2/91, 1/95, 6/00, 1/04, 7/08, 10/10   ESOPHAGOGASTRODUODENOSCOPY  03/07/2020   Procedure: ESOPHAGOGASTRODUODENOSCOPY (EGD);  Surgeon: Maryruth Ole DASEN, MD;  Location: [facility] ENDOSCOPY;  Service: Endoscopy;;   EXCISION NEUROMA Left 06/2007   foot  had this done twice   FINGER SURGERY Bilateral    for congenital webbing   GANGLION CYST EXCISION Right 09/23/2019   Procedure: REMOVAL GANGLION OF FLEXOR SHEATH RIGHT LONG FINGER;  Surgeon: Edie Norleen PARAS, MD;  Location: [facility];  Service: Orthopedics;  Laterality: Right;   IRRIGATION AND DEBRIDEMENT FOOT Left 11/09/2018   Procedure: IRRIGATION AND DEBRIDEMENT FOOT;  Surgeon: Neill Boas, DPM;  Location: [facility];  Service: Podiatry;  Laterality: Left;   KNEE ARTHROSCOPY Right 03/22/2020   Procedure: RIGHT KNEE ARTHROSCOPY WITH PARTIAL MENISCECTOMY;  Surgeon: Edie Norleen PARAS, MD;  Location: [facility];  Service: Orthopedics;  Laterality: Right;   KNEE ARTHROSCOPY Right 05/25/2021   Procedure: RIGHT KNEE ARTHROSCOPY WITH DEBRIDEMENT AND PARTIAL MEDIAL MENISCECTOMY;  Surgeon: Edie Norleen PARAS, MD;  Location: [facility];  Service: Orthopedics;  Laterality: Right;   KNEE ARTHROSCOPY WITH MENISCAL  REPAIR Right 03/22/2020   Procedure: KNEE ARTHROSCOPY WITH MENISCAL REPAIR;  Surgeon: Edie Norleen PARAS, MD;  Location: [facility];  Service: Orthopedics;  Laterality: Right;   LASIK Bilateral    MANDIBLE SURGERY     in late 30s, fo overbite   PROSTATE BIOPSY  12/2015   REVERSE SHOULDER ARTHROPLASTY Left 05/07/2023   Procedure: ARTHROPLASTY, SHOULDER, TOTAL, REVERSE;  Surgeon: Tobie Priest, MD;  Location: [facility];  Service: Orthopedics;  Laterality: Left;  Left reverse shoulder arthroplasty, biceps tenodesis   ROTATOR CUFF REPAIR Left 02/2005   SHOULDER ARTHROSCOPY WITH DEBRIDEMENT AND BICEP TENDON REPAIR Left 11/20/2022   Procedure: Left shoulder open irrigation and debridement, arthroscopic irrigation and debridement and extensive debridment;  Surgeon: Tobie Priest, MD;  Location: [facility];  Service: Orthopedics;  Laterality: Left;   SHOULDER ARTHROSCOPY WITH SUBACROMIAL DECOMPRESSION AND OPEN ROTATOR C Right 07/16/2019   Procedure: Right shoulder arthroscopy with debridement, decompression, possible rotator cuff repair, and probable biceps tenodesis;  Surgeon: Edie Norleen PARAS, MD;  Location: [facility];  Service: Orthopedics;  Laterality: Right;   TEE  WITHOUT CARDIOVERSION N/A 02/18/2023   Procedure: TRANSESOPHAGEAL ECHOCARDIOGRAM (TEE);  Surgeon: Hilarie Rocher, MD;  Location: [facility];  Service: Cardiovascular;  Laterality: N/A;   THYROIDECTOMY, PARTIAL     TONSILLECTOMY AND ADENOIDECTOMY      Social History   Socioeconomic History   Marital status: Married    Spouse name: Not on file   Number of children: Not on file   Years of education: Not on file   Highest education level: Not on file  Occupational History   Not on file  Tobacco Use   Smoking status: Never   Smokeless tobacco: Never  Vaping Use   Vaping status: Never Used  Substance and Sexual Activity   Alcohol use: Yes    Alcohol/week: 1.0 - 2.0 standard drink of alcohol    Types: 1 - 2 Cans of beer per week    Comment:  occasional beer   Drug use: No   Sexual activity: Yes    Partners: Female    Birth control/protection: None  Other Topics Concern   Not on file  Social History Narrative   Not on file   Social Drivers of Health   Financial Resource Strain: Low Risk  (05/13/2023)   Received from [facility]   Overall Financial Resource Strain (CARDIA)    Difficulty of Paying Living Expenses: Not hard at all  Food Insecurity: No Food Insecurity (05/13/2023)   Received from [facility]   Hunger Vital Sign    Within the past 12 months, you worried that your food would run out before you got the money to buy more.: Never true    Within the past 12 months, the food you bought just didn't last and you didn't have money to get more.: Never true  Transportation Needs: No Transportation Needs (05/13/2023)   Received from [facility]    In the past 12 months, has lack of transportation kept you from medical appointments or from getting medications?: No    Lack of Transportation (Non-Medical): No  Physical Activity: Not on file  Stress: Not on file  Social Connections: Not on file  Intimate Partner Violence: Not At Risk (04/11/2023)   Humiliation, Afraid, Rape, and Kick questionnaire    Fear of Current or Ex-Partner: No    Emotionally Abused: No    Physically Abused: No    Sexually Abused: No    Family History  Problem Relation Age of Onset   Dementia Mother    Pancreatic cancer Father    Prostate cancer Brother    Allergies  Allergen Reactions   Lamictal [Lamotrigine] Hives   Pravastatin Other (See Comments)    Muscle joint/pain.   Simvastatin Other (See Comments)    Muscle aches   Statins Other (See Comments)    Muscle/joint aches    I? Current Outpatient Medications  Medication Sig Dispense Refill   ACCU-CHEK GUIDE TEST test strip USE 1 STRIP ONCE DAILY     acetaminophen  (TYLENOL ) 500 MG tablet Take 2 tablets  (1,000 mg total) by mouth every 8 (eight) hours. 90 tablet 2   amiodarone (PACERONE) 200 MG tablet Take 200 mg by mouth daily before lunch.     apixaban  (ELIQUIS ) 5 MG TABS tablet Take 5 mg by mouth 2 (two) times daily.     busPIRone (BUSPAR) 7.5 MG tablet Take 7.5 mg by mouth 2 (two) times daily.     carvedilol  (COREG ) 6.25 MG tablet Take 6.25  mg by mouth 2 (two) times daily with a meal.     Cholecalciferol  (VITAMIN D3) 125 MCG (5000 UT) TABS Take 5,000 Units by mouth in the morning.     Cyanocobalamin  (VITAMIN B-12 PO) Take 250 mcg by mouth in the morning.     empagliflozin (JARDIANCE) 10 MG TABS tablet Take 10 mg by mouth in the morning.     esomeprazole (NEXIUM) 20 MG capsule Take 20 mg by mouth daily as needed (acid reflux/indigestion.).     finasteride  (PROSCAR ) 5 MG tablet TAKE 1 TABLET BY MOUTH EVERY DAY 90 tablet 3   fluticasone  (FLONASE ) 50 MCG/ACT nasal spray Place 1-2 sprays into both nostrils daily as needed for allergies.      levothyroxine  (SYNTHROID ) 175 MCG tablet Take 175 mcg by mouth daily before breakfast.     loratadine  (CLARITIN ) 10 MG tablet Take 10 mg by mouth daily.     MOUNJARO 2.5 MG/0.5ML Pen SMARTSIG:0.5 Milliliter(s) SUB-Q Once a Week     ondansetron  (ZOFRAN -ODT) 4 MG disintegrating tablet Take 1 tablet (4 mg total) by mouth every 8 (eight) hours as needed for nausea or vomiting. 20 tablet 0   rosuvastatin  (CRESTOR ) 5 MG tablet Take 5 mg by mouth every Monday, Wednesday, and Friday. At night     sertraline  (ZOLOFT ) 100 MG tablet Take 100 mg by mouth in the morning.     tadalafil (CIALIS) 20 MG tablet Take 20 mg by mouth daily as needed for erectile dysfunction.     tamsulosin  (FLOMAX ) 0.4 MG CAPS capsule TAKE 1 CAPSULE BY MOUTH EVERY DAY 90 capsule 1   Testosterone  Enanthate (XYOSTED ) 100 MG/0.5ML SOAJ Inject 0.5 mLs (100 mg total) into the skin every Wednesday. 2 mL 2   valsartan (DIOVAN) 80 MG tablet Take 80 mg by mouth in the morning.     zinc  sulfate 220 (50 Zn)  MG capsule Take 220 mg by mouth in the morning.     No current facility-administered medications for this visit.     Abtx:  Anti-infectives (From admission, onward)    None       REVIEW OF SYSTEMS:  Const: negative fever, negative chills, negative weight loss Eyes: negative diplopia or visual changes, negative eye pain ENT: negative coryza, negative sore throat Resp: negative cough, hemoptysis, dyspnea Cards: negative for chest pain, has palpitations, lower extremity edema GU: negative for frequency, dysuria and hematuria GI: Negative for abdominal pain, diarrhea, bleeding, constipation Skin: negative for rash and pruritus Heme: negative for easy bruising and gum/nose bleeding FD:opfpuzi movt left shoulder, no pain or swelling Neurolo:negative for headaches, dizziness, vertigo, memory problems  Psych:  anxiety, depression  Endocrine: hypothyroidism Allergy/Immunology- as above Objective:  VITALS:  BP (!) 152/92   Pulse (!) 52   Temp 98.2 F (36.8 C) (Temporal)   Ht 6' (1.829 m)   Wt 236 lb (107 kg)   SpO2 93%   BMI 32.01 kg/m   PHYSICAL EXAM:  General: Alert, cooperative, no distress, appears stated age.  Head: Normocephalic, without obvious abnormality, atraumatic. Eyes: Conjunctivae clear, anicteric sclerae. Pupils are equal ENT Nares normal. No drainage or sinus tenderness. Lips, mucosa, and tongue normal. No Thrush Neck: Supple, symmetrical, no adenopathy, thyroid : non tender no carotid bruit and no JVD. Back: No CVA tenderness. Lungs: Clear to auscultation bilaterally. No Wheezing or Rhonchi. No rales. Heart: s1s2 Abdomen: not examined Extremities: left shoulder- surgical site healed very well No erythema or discharge Skin: No rashes or lesions. Or bruising Lymph: Cervical,  supraclavicular normal. 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 Followed by oral antibiotics:Amoxicillin  TID and Doxycycline  100mg  BID X 4 weeks ( amoxicillin  will be 1 gram TID for 2 weeks followed by 500mg  TID for 2 weeks. -completed total of 8 weeks Feb 2025  Atrial fibrillation   His atrial fibrillation is managed with amiodarone and anticoagulation with Eliquis . S/p cardioversion  Hypothyroidism   On  Synthroid .  Depression   on sertraline   Benign prostatic hyperplasia   on tamsulosin  (Flomax ) and finasteride  (Proscar ).  ?_______ Discussed with patient in detail Follow up 10-12  weeks

## 2023-08-20 ENCOUNTER — Ambulatory Visit: Payer: Self-pay

## 2023-08-26 ENCOUNTER — Encounter: Payer: Self-pay | Admitting: Urology

## 2023-08-27 ENCOUNTER — Encounter: Payer: Self-pay | Admitting: Urology

## 2023-09-24 ENCOUNTER — Other Ambulatory Visit: Payer: Self-pay | Admitting: *Deleted

## 2023-09-24 DIAGNOSIS — E291 Testicular hypofunction: Secondary | ICD-10-CM

## 2023-10-02 ENCOUNTER — Other Ambulatory Visit

## 2023-10-02 DIAGNOSIS — E291 Testicular hypofunction: Secondary | ICD-10-CM

## 2023-10-03 LAB — TESTOSTERONE: Testosterone: 537 ng/dL (ref 264–916)

## 2023-10-03 LAB — HEMOGLOBIN AND HEMATOCRIT, BLOOD
Hematocrit: 48 % (ref 37.5–51.0)
Hemoglobin: 15.6 g/dL (ref 13.0–17.7)

## 2023-10-08 ENCOUNTER — Ambulatory Visit: Attending: Infectious Diseases | Admitting: Infectious Diseases

## 2023-10-08 ENCOUNTER — Encounter: Payer: Self-pay | Admitting: Infectious Diseases

## 2023-10-08 VITALS — BP 152/85 | HR 58 | Temp 98.4°F | Ht 72.0 in | Wt 228.0 lb

## 2023-10-08 DIAGNOSIS — M00812 Arthritis due to other bacteria, left shoulder: Secondary | ICD-10-CM | POA: Insufficient documentation

## 2023-10-08 DIAGNOSIS — E039 Hypothyroidism, unspecified: Secondary | ICD-10-CM | POA: Diagnosis not present

## 2023-10-08 DIAGNOSIS — T8459XD Infection and inflammatory reaction due to other internal joint prosthesis, subsequent encounter: Secondary | ICD-10-CM

## 2023-10-08 DIAGNOSIS — Z7901 Long term (current) use of anticoagulants: Secondary | ICD-10-CM | POA: Insufficient documentation

## 2023-10-08 DIAGNOSIS — B9689 Other specified bacterial agents as the cause of diseases classified elsewhere: Secondary | ICD-10-CM

## 2023-10-08 DIAGNOSIS — Z792 Long term (current) use of antibiotics: Secondary | ICD-10-CM | POA: Diagnosis not present

## 2023-10-08 DIAGNOSIS — F32A Depression, unspecified: Secondary | ICD-10-CM | POA: Diagnosis not present

## 2023-10-08 DIAGNOSIS — I4891 Unspecified atrial fibrillation: Secondary | ICD-10-CM

## 2023-10-08 DIAGNOSIS — Z96612 Presence of left artificial shoulder joint: Secondary | ICD-10-CM | POA: Diagnosis not present

## 2023-10-08 DIAGNOSIS — N400 Enlarged prostate without lower urinary tract symptoms: Secondary | ICD-10-CM | POA: Diagnosis not present

## 2023-10-08 DIAGNOSIS — Z79899 Other long term (current) drug therapy: Secondary | ICD-10-CM | POA: Insufficient documentation

## 2023-10-08 DIAGNOSIS — T8459XA Infection and inflammatory reaction due to other internal joint prosthesis, initial encounter: Secondary | ICD-10-CM | POA: Insufficient documentation

## 2023-10-08 DIAGNOSIS — T8450XD Infection and inflammatory reaction due to unspecified internal joint prosthesis, subsequent encounter: Secondary | ICD-10-CM

## 2023-10-08 MED ORDER — AMOXICILLIN 500 MG PO CAPS: 500.0000 mg | ORAL_CAPSULE | Freq: Three times a day (TID) | ORAL | 6 refills | Status: AC

## 2023-10-08 NOTE — Progress Notes (Signed)
 P  [id number]

## 2023-10-08 NOTE — Progress Notes (Signed)
 NAME: Jacob Lawson  DOB: 1955/06/16  MRN: [id number]  Date/Time: 10/08/2023 9:37 AM  Follow up visit Subjective:  Jacob Lawson is a 68 y.o. 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 Complicated by  small area of erythematous swelling  at the surgical site and was given bactrim on 10/23/22 and as there was worsening he was admitted and underwent I/D and extensive glenohumeral debridement and synovectomy and removal of any hardware on 11/20/22.  Culture positive for propiniobacterium and staph epi- He completed ceftriaxone  and IV dapto for 4 weeks until 11/30 . He took Amoxicillin  for a month after that HE was off antibiotics and waiting to have surgery There was no fluid to aspirate prior to surgery He underwent shoulder replacement on 05/07/23 1of 4 culture positive of r 1 colony of propinibacterium on PO amoxicllin 1 gram TID and doing well Left shoulder surgical site healed well Shoulder movts better  Past Medical History:  Diagnosis Date   (HFpEF) heart failure with preserved ejection fraction (HCC)    Acquired hypothyroidism    Allergic rhinitis    Anginal pain (HCC)    Anticoagulated on apixaban     Aortic atherosclerosis (HCC)    Aortic root dilation (HCC)    Atrial fibrillation (HCC)    a.) CHA2DS2VASc = 5 (age, HFpEF,  HTN, vascular disease, T2DM) as of 05/01/2023; b.) s/p DCCV 03/17/2021 --> 120 J x 1 --> NSR; c.) failed TEE with cardioversion 02/18/2023 --> became apneic with desat to 60s --> procedure aborted; d.) s/p DCCV 03/18/2023 --> 120 J x 1 -->; e.) c.) rate/rhythm maintained on oral amiodarone + carvedilol ; chronically anticoagulated with apixaban    Bacteremia due to Gram-negative bacteria 11/14/2018   a.) setting of puncture wound to LEFT foot   Bilateral leg edema    BPH (benign prostatic hyperplasia)    Carpal tunnel syndrome on both sides    Chronic lower back pain    CKD (chronic kidney disease), stage III (HCC)    Colloid LEFT thyroid  nodule    Depression    Difficult airway for intubation 11/20/2022   a.) Grade I view with McGrath X3 blade; small mouth opening, large tongue, crowded redundant tissue   DOE (dyspnea on exertion)    Ectatic abdominal aorta (HCC)    Erectile dysfunction    a.) on PDE5i (tadalafil )   Generalized anxiety disorder    GERD (gastroesophageal reflux disease)    Hepatic steatosis    Hyperlipidemia    Hypertension    Hypogonadism in male    a.) s/p Testim  implant in 2014. b.) uses exogenous testosterone    Hypothyroidism    Long term current use of amiodarone    Migraines    Obesity    Occipital neuralgia    OSA on CPAP    Osteoarthritis    Pulmonary nodules    Renal cyst, left    Right rotator cuff tear  Secondary polycythemia    a.) OSAH + exogenous testosterone  use + obesity   T2DM (type 2 diabetes mellitus) (HCC)    Vitiligo    Wears hearing aid in both ears     Past Surgical History:  Procedure Laterality Date   APPENDECTOMY     ARTHOSCOPIC ROTAOR CUFF REPAIR Left 11/20/2022   Procedure: Left shoulder open irrigation and debridement, arthroscopic irrigation and debridement and extensive debridment;  Surgeon: Tobie Priest, MD;  Location: [facility];  Service: Orthopedics;  Laterality: Left;   CARDIOVERSION N/A 02/18/2023   Procedure:  CARDIOVERSION;  Surgeon: Hilarie Rocher, MD;  Location: [facility];  Service: Cardiovascular;  Laterality: N/A;   CARDIOVERSION N/A 03/18/2023   Procedure: CARDIOVERSION;  Surgeon: Florencio Cara BIRCH, MD;  Location: [facility];  Service: Cardiovascular;  Laterality: N/A;   CARPAL TUNNEL RELEASE Right 11/2003   CARPAL TUNNEL RELEASE Left 11/19/2017   Procedure: LEFT OPEN CARPAL TUNNEL RELEASE;  Surgeon: Maryl Barters, MD;  Location: [facility];  Service: Orthopedics;  Laterality: Left;  Diabetic - oral meds Sleep Apnea   CHONDROPLASTY Right 03/22/2020   Procedure: CHONDROPLASTY;  Surgeon: Edie Norleen PARAS, MD;  Location: [facility];  Service: Orthopedics;  Laterality: Right;  ABRASION CHONDROPLASTY   COLONOSCOPY     2/91, 6/00, 01/04, 7/08, 10/10, 12/13, 02/26/17   COLONOSCOPY WITH PROPOFOL  N/A 03/07/2020   Procedure: COLONOSCOPY WITH PROPOFOL ;  Surgeon: Maryruth Ole DASEN, MD;  Location: [facility];  Service: Endoscopy;  Laterality: N/A;   ESOPHAGOGASTRODUODENOSCOPY     2/91, 1/95, 6/00, 1/04, 7/08, 10/10   ESOPHAGOGASTRODUODENOSCOPY  03/07/2020   Procedure: ESOPHAGOGASTRODUODENOSCOPY (EGD);  Surgeon: Maryruth Ole DASEN, MD;  Location: [facility];  Service: Endoscopy;;   EXCISION NEUROMA Left 06/2007   foot  had this done twice   FINGER SURGERY Bilateral    for congenital webbing   GANGLION CYST EXCISION Right 09/23/2019   Procedure: REMOVAL GANGLION OF FLEXOR SHEATH RIGHT LONG FINGER;  Surgeon: Edie Norleen PARAS, MD;  Location: [facility];  Service: Orthopedics;  Laterality: Right;   IRRIGATION AND DEBRIDEMENT FOOT Left 11/09/2018   Procedure: IRRIGATION AND DEBRIDEMENT FOOT;  Surgeon: Neill Boas, DPM;  Location: [facility];  Service: Podiatry;  Laterality: Left;   KNEE ARTHROSCOPY Right 03/22/2020   Procedure: RIGHT KNEE ARTHROSCOPY WITH PARTIAL MENISCECTOMY;  Surgeon: Edie Norleen PARAS, MD;  Location: [facility];  Service: Orthopedics;  Laterality: Right;   KNEE ARTHROSCOPY Right  05/25/2021   Procedure: RIGHT KNEE ARTHROSCOPY WITH DEBRIDEMENT AND PARTIAL MEDIAL MENISCECTOMY;  Surgeon: Edie Norleen PARAS, MD;  Location: [facility];  Service: Orthopedics;  Laterality: Right;   KNEE ARTHROSCOPY WITH MENISCAL REPAIR Right 03/22/2020   Procedure: KNEE ARTHROSCOPY WITH MENISCAL REPAIR;  Surgeon: Edie Norleen PARAS, MD;  Location: [facility];  Service: Orthopedics;  Laterality: Right;   LASIK Bilateral    MANDIBLE SURGERY     in late 30s, fo overbite   PROSTATE BIOPSY  12/2015   REVERSE SHOULDER ARTHROPLASTY Left 05/07/2023   Procedure: ARTHROPLASTY, SHOULDER, TOTAL, REVERSE;  Surgeon: Tobie Priest, MD;  Location: [facility];  Service: Orthopedics;  Laterality: Left;  Left reverse shoulder arthroplasty, biceps tenodesis   ROTATOR CUFF REPAIR Left 02/2005   SHOULDER ARTHROSCOPY WITH DEBRIDEMENT AND BICEP TENDON REPAIR Left 11/20/2022   Procedure: Left shoulder open irrigation and debridement, arthroscopic irrigation and debridement and extensive debridment;  Surgeon: Tobie Priest, MD;  Location: [facility];  Service: Orthopedics;  Laterality: Left;   SHOULDER ARTHROSCOPY WITH SUBACROMIAL DECOMPRESSION AND OPEN ROTATOR C  Right 07/16/2019   Procedure: Right shoulder arthroscopy with debridement, decompression, possible rotator cuff repair, and probable biceps tenodesis;  Surgeon: Edie Norleen PARAS, MD;  Location: [facility];  Service: Orthopedics;  Laterality: Right;   TEE WITHOUT CARDIOVERSION N/A 02/18/2023   Procedure: TRANSESOPHAGEAL ECHOCARDIOGRAM (TEE);  Surgeon: Hilarie Rocher, MD;  Location: [facility];  Service: Cardiovascular;  Laterality: N/A;   THYROIDECTOMY, PARTIAL     TONSILLECTOMY AND ADENOIDECTOMY      Social History   Socioeconomic History   Marital status: Married    Spouse name: Not on file   Number of children: Not on file   Years of education: Not on file   Highest education level: Not on file  Occupational History   Not on file  Tobacco Use   Smoking status: Never    Smokeless tobacco: Never  Vaping Use   Vaping status: Never Used  Substance and Sexual Activity   Alcohol use: Yes    Alcohol/week: 1.0 - 2.0 standard drink of alcohol    Types: 1 - 2 Cans of beer per week    Comment: occasional beer   Drug use: No   Sexual activity: Yes    Partners: Female    Birth control/protection: None  Other Topics Concern   Not on file  Social History Narrative   Not on file   Social Drivers of Health   Financial Resource Strain: Low Risk  (08/29/2023)   Received from [facility]   Overall Financial Resource Strain (CARDIA)    Difficulty of Paying Living Expenses: Not hard at all  Food Insecurity: No Food Insecurity (08/29/2023)   Received from [facility]   Hunger Vital Sign    Within the past 12 months, you worried that your food would run out before you got the money to buy more.: Never true    Within the past 12 months, the food you bought just didn't last and you didn't have money to get more.: Never true  Transportation Needs: No Transportation Needs (08/29/2023)   Received from [facility]    In the past 12 months, has lack of transportation kept you from medical appointments or from getting medications?: No    Lack of Transportation (Non-Medical): No  Physical Activity: Not on file  Stress: Not on file  Social Connections: Not on file  Intimate Partner Violence: Not At Risk (04/11/2023)   Humiliation, Afraid, Rape, and Kick questionnaire    Fear of Current or Ex-Partner: No    Emotionally Abused: No    Physically Abused: No    Sexually Abused: No    Family History  Problem Relation Age of Onset   Dementia Mother    Pancreatic cancer Father    Prostate cancer Brother    Allergies  Allergen Reactions   Lamictal [Lamotrigine] Hives   Pravastatin Other (See Comments)    Muscle joint/pain.   Simvastatin Other (See Comments)    Muscle aches   Statins Other (See  Comments)    Muscle/joint aches    I? Current Outpatient Medications  Medication Sig Dispense Refill   ACCU-CHEK GUIDE TEST test strip USE 1 STRIP ONCE DAILY     acetaminophen  (TYLENOL ) 500 MG tablet Take 2 tablets (1,000 mg total) by mouth every 8 (eight) hours. 90 tablet 2   amiodarone (PACERONE) 200 MG tablet Take 200 mg by mouth daily before lunch.     amoxicillin  (AMOXIL ) 500 MG capsule Take  1 capsule (500 mg total) by mouth 3 (three) times daily. 90 capsule 2   apixaban  (ELIQUIS ) 5 MG TABS tablet Take 5 mg by mouth 2 (two) times daily.     busPIRone (BUSPAR) 7.5 MG tablet Take 7.5 mg by mouth 2 (two) times daily.     carvedilol  (COREG ) 6.25 MG tablet Take 6.25 mg by mouth 2 (two) times daily with a meal.     Cholecalciferol  (VITAMIN D3) 125 MCG (5000 UT) TABS Take 5,000 Units by mouth in the morning.     Cyanocobalamin  (VITAMIN B-12 PO) Take 250 mcg by mouth in the morning.     empagliflozin (JARDIANCE) 10 MG TABS tablet Take 10 mg by mouth in the morning.     esomeprazole (NEXIUM) 20 MG capsule Take 20 mg by mouth daily as needed (acid reflux/indigestion.).     finasteride  (PROSCAR ) 5 MG tablet TAKE 1 TABLET BY MOUTH EVERY DAY 90 tablet 3   fluticasone  (FLONASE ) 50 MCG/ACT nasal spray Place 1-2 sprays into both nostrils daily as needed for allergies.      gabapentin (NEURONTIN) 100 MG capsule Take 100 mg by mouth at bedtime.     levothyroxine  (SYNTHROID ) 175 MCG tablet Take 175 mcg by mouth daily before breakfast.     loratadine  (CLARITIN ) 10 MG tablet Take 10 mg by mouth daily.     MOUNJARO 2.5 MG/0.5ML Pen SMARTSIG:0.5 Milliliter(s) SUB-Q Once a Week     ondansetron  (ZOFRAN -ODT) 4 MG disintegrating tablet Take 1 tablet (4 mg total) by mouth every 8 (eight) hours as needed for nausea or vomiting. 20 tablet 0   rosuvastatin  (CRESTOR ) 5 MG tablet Take 5 mg by mouth every Monday, Wednesday, and Friday. At night     sertraline  (ZOLOFT ) 100 MG tablet Take 100 mg by mouth in the morning.      tadalafil  (CIALIS ) 20 MG tablet Take 20 mg by mouth daily as needed for erectile dysfunction.     tamsulosin  (FLOMAX ) 0.4 MG CAPS capsule TAKE 1 CAPSULE BY MOUTH EVERY DAY 90 capsule 1   Testosterone  Enanthate (XYOSTED ) 100 MG/0.5ML SOAJ Inject 0.5 mLs (100 mg total) into the skin every Wednesday. 2 mL 2   valsartan (DIOVAN) 80 MG tablet Take 80 mg by mouth in the morning.     zinc  sulfate 220 (50 Zn) MG capsule Take 220 mg by mouth in the morning.     No current facility-administered medications for this visit.     Abtx:  Anti-infectives (From admission, onward)    None       REVIEW OF SYSTEMS:  Const: negative fever, negative chills, negative weight loss Eyes: negative diplopia or visual changes, negative eye pain ENT: negative coryza, negative sore throat Resp: negative cough, hemoptysis, dyspnea Cards: negative for chest pain, has palpitations, lower extremity edema GU: negative for frequency, dysuria and hematuria GI: Negative for abdominal pain, diarrhea, bleeding, constipation Skin: negative for rash and pruritus Heme: negative for easy bruising and gum/nose bleeding FD:opfpuzi movt left shoulder, no pain or swelling Neurolo:negative for headaches, dizziness, vertigo, memory problems  Psych:  anxiety, depression  Endocrine: hypothyroidism Allergy/Immunology- as above Objective:  VITALS:  BP (!) 152/85   Pulse (!) 58   Temp 98.4 F (36.9 C) (Temporal)   Ht 6' (1.829 m)   Wt 228 lb (103.4 kg)   SpO2 93%   BMI 30.92 kg/m   PHYSICAL EXAM:  General: Alert, cooperative, no distress, appears stated age.  Head: Normocephalic, without obvious abnormality, atraumatic. Eyes: Conjunctivae  clear, anicteric sclerae. Pupils are equal ENT Nares normal. No drainage or sinus tenderness. Lips, mucosa, and tongue normal. No Thrush Neck: Supple, symmetrical, no adenopathy, thyroid : non tender no carotid bruit and no JVD. Back: No CVA tenderness. Lungs: Clear to auscultation  bilaterally. No Wheezing or Rhonchi. No rales. Heart: s1s2 Abdomen: not examined Extremities: left shoulder- surgical site healed very well No erythema or discharge Skin: No rashes or lesions. Or bruising Lymph: Cervical, supraclavicular normal. 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 Followed by oral antibiotics:Amoxicillin  TID and Doxycycline  100mg  BID X 4 weeks ( amoxicillin  will be 1 gram TID for 2 weeks followed by 500mg  TID for 2 weeks. -completed total of 8 weeks-Feb 2025 then underwent PJ replacement in April 2025 . Culture taken during surgery 1 /4 culture had 1 colony of PA  Atrial fibrillation   His atrial fibrillation is managed with amiodarone and anticoagulation with Eliquis . S/p cardioversion  Hypothyroidism   On  Synthroid .  Depression   on sertraline   Benign prostatic hyperplasia   on tamsulosin  (Flomax ) and finasteride  (Proscar ).  ?_______ Discussed with patient in detail Follow up 6 months

## 2023-10-09 ENCOUNTER — Ambulatory Visit: Admitting: Urology

## 2023-10-10 NOTE — Progress Notes (Signed)
 10/11/2023 10:26 AM   [name] 1955-02-09 [id number]  Referring provider: Epifanio Alm SQUIBB, MD [address] [place],  [place] [address]  Urological history: 1. BPH with LU TS - PSA pending  2.  Hypogonadism - Testosterone  level (09/2023) 537 - Hemoglobin/hematocrit (09/2023) 15.6/48.0 - Tamsulosin  0.4 mg daily - Finasteride  5 mg daily  3.  Erectile dysfunction - Sildenafil  20 mg on demand dosing - Tadalafil  20 mg, on-demand dosing  4. Delayed ejaculation   Chief Complaint  Patient presents with   Hypogonadism   HPI: Jacob Lawson is a 68 y.o. man who presents today for yearly visit.    Previous records reviewed.  He has type 2 diabetes mellitus making him more prone to hypogonadism. He reports good adherence to Xyosted  100 mg/0.5 mL.  Denies new complaints of low libido, erectile dysfunction, fatigue, or mood changes.  No complaints of gynecomastia, visual changes, or thromboembolic symptoms.  Energy level, libido and overall sense of wellbeing being reported as stable/ improved compared to prior visit.     He reports no sensation of incomplete bladder emptying, no urinary frequency, no urinary intermittency, no urinary urgency, does not endorse a weak urinary stream, he is not having to strain to void, nocturia x 1, no leaking before being able to reach the restroom, l no eaking with coughing, no leaking without awareness, and no post void dribbling.     Patient denies any modifying or aggravating factors.  Patient denies any recent UTI's, gross hematuria, dysuria or suprapubic/flank pain.  Patient denies any fevers, chills, nausea or vomiting.    He has a family history of PCa with his father and brother having prostate cancer.  Sister  with ovarian cancer.      UA (04//2025) > 500  glucose  PSA  pending   Serum creatinine (07/2023) 1.3, eGFR 60  Hemoglobin A1c (07/2023) 5.4  Diuretics:  hydrochlorothiazide  25 mg daily   He is having  satisfactory erections with the tadalafil  20 mg on demand dosing.  Patient still having spontaneous erections.  He denies any pain or curvature with erections.  He is having issues with ejaculation.  Sometimes it is delayed or he is unable to ejaculate.  Testosterone  level ([id number]) 537  TSH (08/2023) 0.693   PMH: Past Medical History:  Diagnosis Date   (HFpEF) heart failure with preserved ejection fraction (HCC)    Acquired hypothyroidism    Allergic rhinitis    Anginal pain (HCC)    Anticoagulated on apixaban     Aortic atherosclerosis (HCC)    Aortic root dilation (HCC)    Atrial fibrillation (HCC)    a.) CHA2DS2VASc = 5 (age, HFpEF, HTN, vascular disease, T2DM) as of 05/01/2023; b.) s/p DCCV 03/17/2021 --> 120 J x 1 --> NSR; c.) failed TEE with cardioversion 02/18/2023 --> became apneic with desat to 60s --> procedure aborted; d.) s/p DCCV 03/18/2023 --> 120 J x 1 -->; e.) c.) rate/rhythm maintained on oral amiodarone + carvedilol ; chronically anticoagulated with apixaban    Bacteremia due to Gram-negative bacteria 11/14/2018   a.) setting of puncture wound to LEFT foot   Bilateral leg edema    BPH (benign prostatic hyperplasia)    Carpal tunnel syndrome on both sides    Chronic lower back pain    CKD (chronic kidney disease), stage III (HCC)    Colloid LEFT thyroid  nodule    Depression    Difficult airway for intubation 11/20/2022   a.) Grade I view with Loring  X3 blade; small mouth opening, large tongue, crowded redundant tissue   DOE (dyspnea on exertion)    Ectatic abdominal aorta (HCC)    Erectile dysfunction    a.) on PDE5i (tadalafil )   Generalized anxiety disorder    GERD (gastroesophageal reflux disease)    Hepatic steatosis    Hyperlipidemia    Hypertension    Hypogonadism in male    a.) s/p Testim  implant in 2014. b.) uses exogenous testosterone    Hypothyroidism    Long term current use of amiodarone    Migraines    Obesity    Occipital neuralgia    OSA on  CPAP    Osteoarthritis    Pulmonary nodules    Renal cyst, left    Right rotator cuff tear    Secondary polycythemia    a.) OSAH + exogenous testosterone  use + obesity   T2DM (type 2 diabetes mellitus) (HCC)    Vitiligo    Wears hearing aid in both ears     Surgical History: Past Surgical History:  Procedure Laterality Date   APPENDECTOMY     ARTHOSCOPIC ROTAOR CUFF REPAIR Left 11/20/2022   Procedure: Left shoulder open irrigation and debridement, arthroscopic irrigation and debridement and extensive debridment;  Surgeon: Tobie Priest, MD;  Location: [facility];  Service: Orthopedics;  Laterality: Left;   CARDIOVERSION N/A 02/18/2023   Procedure: CARDIOVERSION;  Surgeon: Hilarie Rocher, MD;  Location: [facility];  Service: Cardiovascular;  Laterality: N/A;   CARDIOVERSION N/A 03/18/2023   Procedure: CARDIOVERSION;  Surgeon: Florencio Cara BIRCH, MD;  Location: [facility];  Service: Cardiovascular;  Laterality: N/A;   CARPAL TUNNEL RELEASE Right 11/2003   CARPAL TUNNEL RELEASE Left 11/19/2017   Procedure: LEFT OPEN CARPAL TUNNEL RELEASE;  Surgeon: Maryl Barters, MD;  Location: [facility];  Service: Orthopedics;  Laterality: Left;  Diabetic - oral meds Sleep Apnea   CHONDROPLASTY Right 03/22/2020   Procedure: CHONDROPLASTY;  Surgeon: Edie Norleen PARAS, MD;  Location: [facility];  Service: Orthopedics;  Laterality: Right;  ABRASION CHONDROPLASTY   COLONOSCOPY     2/91, 6/00, 01/04, 7/08, 10/10, 12/13, 02/26/17   COLONOSCOPY WITH PROPOFOL  N/A 03/07/2020   Procedure: COLONOSCOPY WITH PROPOFOL ;  Surgeon: Maryruth Ole DASEN, MD;  Location: [facility];  Service: Endoscopy;  Laterality: N/A;   ESOPHAGOGASTRODUODENOSCOPY     2/91, 1/95, 6/00, 1/04, 7/08, 10/10   ESOPHAGOGASTRODUODENOSCOPY  03/07/2020   Procedure: ESOPHAGOGASTRODUODENOSCOPY (EGD);  Surgeon: Maryruth Ole DASEN, MD;  Location: [facility];  Service: Endoscopy;;   EXCISION NEUROMA Left 06/2007   foot  had this done twice    FINGER SURGERY Bilateral    for congenital webbing   GANGLION CYST EXCISION Right 09/23/2019   Procedure: REMOVAL GANGLION OF FLEXOR SHEATH RIGHT LONG FINGER;  Surgeon: Edie Norleen PARAS, MD;  Location: [facility];  Service: Orthopedics;  Laterality: Right;   IRRIGATION AND DEBRIDEMENT FOOT Left 11/09/2018   Procedure: IRRIGATION AND DEBRIDEMENT FOOT;  Surgeon: Neill Boas, DPM;  Location: [facility];  Service: Podiatry;  Laterality: Left;   KNEE ARTHROSCOPY Right 03/22/2020   Procedure: RIGHT KNEE ARTHROSCOPY WITH PARTIAL MENISCECTOMY;  Surgeon: Edie Norleen PARAS, MD;  Location: [facility];  Service: Orthopedics;  Laterality: Right;   KNEE ARTHROSCOPY Right 05/25/2021   Procedure: RIGHT KNEE ARTHROSCOPY WITH DEBRIDEMENT AND PARTIAL MEDIAL MENISCECTOMY;  Surgeon: Edie Norleen PARAS, MD;  Location: [facility];  Service: Orthopedics;  Laterality: Right;   KNEE ARTHROSCOPY WITH MENISCAL REPAIR Right 03/22/2020   Procedure: KNEE ARTHROSCOPY WITH MENISCAL REPAIR;  Surgeon: Edie Norleen PARAS, MD;  Location: [facility];  Service: Orthopedics;  Laterality: Right;   LASIK Bilateral    MANDIBLE SURGERY     in late 30s, fo overbite   PROSTATE BIOPSY  12/2015   REVERSE SHOULDER ARTHROPLASTY Left 05/07/2023   Procedure: ARTHROPLASTY, SHOULDER, TOTAL, REVERSE;  Surgeon: Tobie Priest, MD;  Location: [facility];  Service: Orthopedics;  Laterality: Left;  Left reverse shoulder arthroplasty, biceps tenodesis   ROTATOR CUFF REPAIR Left 02/2005   SHOULDER ARTHROSCOPY WITH DEBRIDEMENT AND BICEP TENDON REPAIR Left 11/20/2022   Procedure: Left shoulder open irrigation and debridement, arthroscopic irrigation and debridement and extensive debridment;  Surgeon: Tobie Priest, MD;  Location: [facility];  Service: Orthopedics;  Laterality: Left;   SHOULDER ARTHROSCOPY WITH SUBACROMIAL DECOMPRESSION AND OPEN ROTATOR C Right 07/16/2019   Procedure: Right shoulder arthroscopy with debridement, decompression, possible rotator cuff repair, and probable  biceps tenodesis;  Surgeon: Edie Norleen PARAS, MD;  Location: [facility];  Service: Orthopedics;  Laterality: Right;   TEE WITHOUT CARDIOVERSION N/A 02/18/2023   Procedure: TRANSESOPHAGEAL ECHOCARDIOGRAM (TEE);  Surgeon: Hilarie Rocher, MD;  Location: [facility];  Service: Cardiovascular;  Laterality: N/A;   THYROIDECTOMY, PARTIAL     TONSILLECTOMY AND ADENOIDECTOMY      Home Medications:  Allergies as of 10/11/2023       Reactions   Lamictal [lamotrigine] Hives   Pravastatin Other (See Comments)   Muscle joint/pain.   Simvastatin Other (See Comments)   Muscle aches   Statins Other (See Comments)   Muscle/joint aches        Medication List        Accurate as of October 11, 2023 11:59 PM. If you have any questions, ask your nurse or doctor.          Accu-Chek Guide Test test strip Generic drug: glucose blood USE 1 STRIP ONCE DAILY   acetaminophen  500 MG tablet Commonly known as: TYLENOL  Take 2 tablets (1,000 mg total) by mouth every 8 (eight) hours.   amiodarone 200 MG tablet Commonly known as: PACERONE Take 200 mg by mouth daily before lunch.   amoxicillin  500 MG capsule Commonly known as: AMOXIL  Take 1 capsule (500 mg total) by mouth 3 (three) times daily.   apixaban  5 MG Tabs tablet Commonly known as: ELIQUIS  Take 5 mg by mouth 2 (two) times daily.   busPIRone 7.5 MG tablet Commonly known as: BUSPAR Take 7.5 mg by mouth 2 (two) times daily.   carvedilol  6.25 MG tablet Commonly known as: COREG  Take 6.25 mg by mouth 2 (two) times daily with a meal.   esomeprazole 20 MG capsule Commonly known as: NEXIUM Take 20 mg by mouth daily as needed (acid reflux/indigestion.).   finasteride  5 MG tablet Commonly known as: PROSCAR  TAKE 1 TABLET BY MOUTH EVERY DAY   fluticasone  50 MCG/ACT nasal spray Commonly known as: FLONASE  Place 1-2 sprays into both nostrils daily as needed for allergies.   gabapentin 100 MG capsule Commonly known as: NEURONTIN Take 100 mg by  mouth at bedtime.   Jardiance 10 MG Tabs tablet Generic drug: empagliflozin Take 10 mg by mouth in the morning.   levothyroxine  175 MCG tablet Commonly known as: SYNTHROID  Take 175 mcg by mouth daily before breakfast.   loratadine  10 MG tablet Commonly known as: CLARITIN  Take 10 mg by mouth daily.   Mounjaro 2.5 MG/0.5ML Pen Generic drug: tirzepatide SMARTSIG:0.5 Milliliter(s) SUB-Q Once a Week   ondansetron  4 MG disintegrating tablet Commonly known as: ZOFRAN -ODT  Take 1 tablet (4 mg total) by mouth every 8 (eight) hours as needed for nausea or vomiting.   rosuvastatin  5 MG tablet Commonly known as: CRESTOR  Take 5 mg by mouth every Monday, Wednesday, and Friday. At night   sertraline  100 MG tablet Commonly known as: ZOLOFT  Take 100 mg by mouth in the morning.   tadalafil  20 MG tablet Commonly known as: CIALIS  Take 1 tablet (20 mg total) by mouth daily as needed for erectile dysfunction.   tamsulosin  0.4 MG Caps capsule Commonly known as: FLOMAX  TAKE 1 CAPSULE BY MOUTH EVERY DAY   valsartan 80 MG tablet Commonly known as: DIOVAN Take 80 mg by mouth in the morning.   VITAMIN B-12 PO Take 250 mcg by mouth in the morning.   Vitamin D3 125 MCG (5000 UT) Tabs Take 5,000 Units by mouth in the morning.   Xyosted  100 MG/0.5ML Soaj Generic drug: Testosterone  Enanthate Inject 0.5 mLs (100 mg total) into the skin every Wednesday.   zinc  sulfate (50mg  elemental zinc ) 220 (50 Zn) MG capsule Take 220 mg by mouth in the morning.        Allergies:  Allergies  Allergen Reactions   Lamictal [Lamotrigine] Hives   Pravastatin Other (See Comments)    Muscle joint/pain.   Simvastatin Other (See Comments)    Muscle aches   Statins Other (See Comments)    Muscle/joint aches     Family History: Family History  Problem Relation Age of Onset   Dementia Mother    Pancreatic cancer Father    Prostate cancer Brother     Social History:  reports that he has never  smoked. He has never used smokeless tobacco. He reports current alcohol use of about 1.0 - 2.0 standard drink of alcohol per week. He reports that he does not use drugs.  ROS: Pertinent ROS in HPI  Physical Exam: BP (!) 157/90 (BP Location: Right Arm, Patient Position: Sitting, Cuff Size: Large)   Pulse 61   Wt 228 lb (103.4 kg)   SpO2 97%   BMI 30.92 kg/m   Constitutional:  Well nourished. Alert and oriented, No acute distress. HEENT:  AT, moist mucus membranes.  Trachea midline Cardiovascular: No clubbing, cyanosis, or edema. Respiratory: Normal respiratory effort, no increased work of breathing. Neurologic: Grossly intact, no focal deficits, moving all 4 extremities. Psychiatric: Normal mood and affect.  Laboratory Data: Lab Results  Component Value Date   WBC 5.8 07/30/2023   HGB 15.6 10/02/2023   HCT 48.0 10/02/2023   MCV 86.5 07/30/2023   PLT 166 07/30/2023    Lab Results  Component Value Date   CREATININE 1.33 (H) 07/30/2023    Lab Results  Component Value Date   TESTOSTERONE  537 10/02/2023  I have reviewed the labs.   Pertinent Imaging: N/A  Assessment & Plan:    1. Hypogonadism  -testosterone  levels are therapeutic -H & H normal -continue Xyosted  100 mg/0.5 mL every 7 days   2. BPH with LUTS -PSA pending  -UA benign -continue conservative management, avoiding bladder irritants and timed voiding's -discontinue tamsulosin  0.4 mg daily, see #3 ejaculatory disorder - He will continue finasteride  5 mg daily at this time, but he may discontinue it at a later date  3. Erectile dysfunction:    - Continue tadalafil  20 mg, on-demand dosing; refill given  4.  Ejaculatory disorder -Patient and I discussed the mechanism of action of his tamsulosin  and finasteride .  I explained to him that the tamsulosin  is in  alpha-blocker which relaxes the smooth muscle encircling the prostate and bladder neck.  This results in an opening of the prostatic urethra and  bladder neck facilitating emptying of the bladder.  The finasteride  blocks the testosterone  stimulation of the prostate therefore maintaining the size of the prostate or in some cases decreasing the size of the prostate.  In relation to the ejaculatory disorder and the reduction of semen volume, I had a frank discussion with the patient that as a man ages and the prostate is enlarged is in size, it can be expected that the amount of semen and a forceful ejaculation can be reduced.  This can occur even without medication as the prostate will enlarge as a man ages.  He has the options to stop the tamsulosin  and finasteride  and see if his sexual function and ejaculatory disorders improve, but his urinary symptoms may worsen.  A bladder outlet procedure may be an option to improve his lower urinary tract symptoms, but these procedures run the risk of ejaculatory disorders and erectile dysfunction.   - He will discontinue the tamsulosin  - He may or may not discontinue the finasteride  pending how his ejaculatory function reacts after stopping the tamsulosin     Return in about 6 months (around 04/09/2024) for Testosterone , hemoglobin, hematocrit and PSA only.  These notes generated with voice recognition software. I apologize for typographical errors.  CLOTILDA HELON RIGGERS  [facility] [address]  [address] [place], [place] [address] [phone]

## 2023-10-11 ENCOUNTER — Ambulatory Visit (INDEPENDENT_AMBULATORY_CARE_PROVIDER_SITE_OTHER): Admitting: Urology

## 2023-10-11 VITALS — BP 157/90 | HR 61 | Wt 228.0 lb

## 2023-10-11 DIAGNOSIS — N401 Enlarged prostate with lower urinary tract symptoms: Secondary | ICD-10-CM

## 2023-10-11 DIAGNOSIS — F5232 Male orgasmic disorder: Secondary | ICD-10-CM | POA: Diagnosis not present

## 2023-10-11 DIAGNOSIS — E291 Testicular hypofunction: Secondary | ICD-10-CM

## 2023-10-11 DIAGNOSIS — N529 Male erectile dysfunction, unspecified: Secondary | ICD-10-CM | POA: Diagnosis not present

## 2023-10-11 DIAGNOSIS — N138 Other obstructive and reflux uropathy: Secondary | ICD-10-CM

## 2023-10-11 MED ORDER — TADALAFIL 20 MG PO TABS: 20.0000 mg | ORAL_TABLET | Freq: Every day | ORAL | 3 refills | Status: AC | PRN

## 2023-10-12 LAB — PSA: Prostate Specific Ag, Serum: 0.7 ng/mL (ref 0.0–4.0)

## 2023-10-13 ENCOUNTER — Encounter: Payer: Self-pay | Admitting: Urology

## 2023-10-13 ENCOUNTER — Ambulatory Visit: Payer: Self-pay | Admitting: Urology

## 2023-10-24 ENCOUNTER — Other Ambulatory Visit: Payer: Self-pay | Admitting: Urology

## 2023-12-21 ENCOUNTER — Other Ambulatory Visit: Payer: Self-pay | Admitting: Urology

## 2023-12-21 DIAGNOSIS — E291 Testicular hypofunction: Secondary | ICD-10-CM

## 2024-01-13 ENCOUNTER — Other Ambulatory Visit: Payer: Self-pay | Admitting: Surgery

## 2024-01-21 ENCOUNTER — Encounter
Admission: RE | Admit: 2024-01-21 | Discharge: 2024-01-21 | Disposition: A | Discharge status: Home / Self Care | Source: Ambulatory Visit | Attending: Surgery | Admitting: Surgery

## 2024-01-21 ENCOUNTER — Other Ambulatory Visit: Payer: Self-pay

## 2024-01-21 VITALS — Ht 72.0 in | Wt 216.0 lb

## 2024-01-21 DIAGNOSIS — E119 Type 2 diabetes mellitus without complications: Secondary | ICD-10-CM

## 2024-01-21 DIAGNOSIS — I10 Essential (primary) hypertension: Secondary | ICD-10-CM

## 2024-01-21 DIAGNOSIS — E78019 Familial hypercholesterolemia, unspecified: Secondary | ICD-10-CM

## 2024-01-21 DIAGNOSIS — I700 Atherosclerosis of aorta: Secondary | ICD-10-CM

## 2024-01-21 NOTE — Patient Instructions (Addendum)
 Your procedure is scheduled on: Wednesday 01/29/24 To find out your arrival time, please call [phone] between 1PM - 3PM on: Tuesday 01/28/24 Report to the Registration Desk on the 1st floor of the [facility]. If your arrival time is 6:00 am, do not arrive before that time as the [facility] entrance doors do not open until 6:00 am.  REMEMBER: Instructions that are not followed completely may result in serious medical risk, up to and including death; or upon the discretion of your surgeon and anesthesiologist your surgery may need to be rescheduled.  Do not eat food after midnight the night before surgery.  No gum chewing or hard candies.  You may however, drink CLEAR liquids up to 2 hours before you are scheduled to arrive for your surgery. Do not drink anything within 2 hours of your scheduled arrival time.  Clear liquids include: - water    In addition, your doctor has ordered for you to drink the provided:  Gatorade G2 Drinking this carbohydrate drink up to two hours before surgery helps to reduce insulin  resistance and improve patient outcomes. Please complete drinking 2 hours before scheduled arrival time.  One week prior to surgery: Stop Anti-inflammatories (NSAIDS) such as Advil, Aleve, Ibuprofen, Motrin, Naproxen, Naprosyn and Aspirin  based products such as Excedrin, Goody's Powder, BC Powder.  You may however, continue to take Tylenol  if needed for pain up until the day of surgery.  Stop ALL OVER THE COUNTER supplements and vitamins for at least 7 days until after surgery.  **Follow guidelines for insulin  and diabetes medications.** Skip Wednesday 01/22/24 dose of Mounjaro. Hold Jardiance for 3 days, last dose Saturday 01/24/24.  **Follow recommendations regarding stopping blood thinners.** Hold Eliquis  for 3 days, last dose Saturday 01/24/24  Continue taking all of your other prescription medications up until the day of surgery. Exception: No Cialis  for 48 hours before  surgery  ON THE DAY OF SURGERY ONLY TAKE THESE MEDICATIONS WITH SIPS OF WATER :  amiodarone (PACERONE) 100 MG tablet  carvedilol  (COREG ) 6.25 MG tablet  esomeprazole (NEXIUM) 20 MG capsule  finasteride  (PROSCAR ) 5 MG tablet  levothyroxine  (SYNTHROID ) 175 MCG tablet  rosuvastatin  (CRESTOR ) 5 MG tablet  sertraline  (ZOLOFT ) 100 MG tablet  tamsulosin  (FLOMAX ) 0.4 MG CAPS capsule   No Alcohol for 24 hours before or after surgery.  No Smoking including e-cigarettes for 24 hours before surgery.  No chewable tobacco products for at least 6 hours before surgery.  No nicotine patches on the day of surgery.  Do not use any recreational drugs for at least a week (preferably 2 weeks) before your surgery.  Please be advised that the combination of cocaine and anesthesia may have negative outcomes, up to and including death. If you test positive for cocaine, your surgery will be cancelled.  On the morning of surgery brush your teeth with toothpaste and water , you may rinse your mouth with mouthwash if you wish. Do not swallow any toothpaste or mouthwash.  Use CHG Soap or wipes as directed on instruction sheet. (You can pick this up at our office in the [facility], the building to the left of the [employer], [address] at [address]  Do not shave body hair from the neck down 48 hours before surgery.  Do not wear lotions, powders, or perfumes.   Wear comfortable clothing (specific to your surgery type) to the hospital.  Do not wear jewelry, make-up, hairpins, clips or nail polish.  For welded (permanent)  jewelry: bracelets, anklets, waist bands, etc.  Please have this removed prior to surgery.  If it is not removed, there is a chance that hospital personnel will need to cut it off on the day of surgery.  Contact lenses, hearing aids and dentures may not be worn into surgery.  Do not bring valuables to the hospital. [facility] is not responsible for any  missing/lost belongings or valuables.   Bring your C-PAP to the hospital in case you may have to spend the night. (Per pt no longer needed but still uses)  Notify your doctor if there is any change in your medical condition (cold, fever, infection).  After surgery, you can help prevent lung complications by doing breathing exercises.  Take deep breaths and cough every 1-2 hours. Your doctor may order a device called an Incentive Spirometer to help you take deep breaths.  If you are being discharged the day of surgery, you will not be allowed to drive home. You will need a responsible individual to drive you home and stay with you for 24 hours after surgery.   If you are taking public transportation, you will need to have a responsible individual with you.  Please call the Pre-admissions Testing Dept. at [phone] if you have any questions about these instructions.  Surgery Visitation Policy:  Patients having surgery or a procedure may have two visitors.  Children under the age of 26 must have an adult with them who is not the patient.  Merchandiser, Retail to address health-related social needs:  [email or web address]                                                                                                             Preparing for Surgery with CHLORHEXIDINE  GLUCONATE (CHG) Soap  Chlorhexidine  Gluconate (CHG) Soap  o An antiseptic cleaner that kills germs and bonds with the skin to continue killing germs even after washing  o Used for showering the night before surgery and morning of surgery  Before surgery, you can play an important role by reducing the number of germs on your skin.  CHG (Chlorhexidine  gluconate) soap is an antiseptic cleanser which kills germs and bonds with the skin to continue killing germs even after washing.  Please do not use if you have an allergy to CHG or antibacterial soaps. If your skin becomes reddened/irritated stop  using the CHG.  1. Shower the NIGHT BEFORE SURGERY with CHG soap.  2. If you choose to wash your hair, wash your hair first as usual with your normal shampoo.  3. After shampooing, rinse your hair and body thoroughly to remove the shampoo.  4. Use CHG as you would any other liquid soap. You can apply CHG directly to the skin and wash gently with a clean washcloth.  5. Apply the CHG soap to your body only from the neck down. Do not use on open wounds or open sores. Avoid contact with your eyes, ears, mouth, and genitals (private parts). Wash face and genitals (private parts) with your normal  soap.  6. Wash thoroughly, paying special attention to the area where your surgery will be performed.  7. Thoroughly rinse your body with warm water .  8. Do not shower/wash with your normal soap after using and rinsing off the CHG soap.  9. Do not use lotions, oils, etc., after showering with CHG.  10. Pat yourself dry with a clean towel.  11. Wear clean pajamas to bed the night before surgery.  12. Place clean sheets on your bed the night of your shower and do not sleep with pets.  13. Do not apply any deodorants/lotions/powders.  14. Please wear clean clothes to the hospital.  15. Remember to brush your teeth with your regular toothpaste.

## 2024-01-22 ENCOUNTER — Encounter
Admission: RE | Admit: 2024-01-22 | Discharge: 2024-01-22 | Disposition: A | Discharge status: Home / Self Care | Source: Ambulatory Visit | Attending: Surgery | Admitting: Surgery

## 2024-01-22 DIAGNOSIS — E119 Type 2 diabetes mellitus without complications: Secondary | ICD-10-CM | POA: Insufficient documentation

## 2024-01-22 DIAGNOSIS — I10 Essential (primary) hypertension: Secondary | ICD-10-CM | POA: Diagnosis not present

## 2024-01-22 DIAGNOSIS — I700 Atherosclerosis of aorta: Secondary | ICD-10-CM | POA: Diagnosis not present

## 2024-01-22 DIAGNOSIS — R001 Bradycardia, unspecified: Secondary | ICD-10-CM | POA: Diagnosis not present

## 2024-01-22 DIAGNOSIS — Z01810 Encounter for preprocedural cardiovascular examination: Secondary | ICD-10-CM | POA: Diagnosis not present

## 2024-01-22 DIAGNOSIS — E78019 Familial hypercholesterolemia, unspecified: Secondary | ICD-10-CM | POA: Insufficient documentation

## 2024-01-29 ENCOUNTER — Ambulatory Visit

## 2024-01-29 ENCOUNTER — Encounter: Payer: Self-pay | Admitting: Surgery

## 2024-01-29 ENCOUNTER — Encounter: Admission: RE | Disposition: A | Payer: Self-pay | Source: Ambulatory Visit | Attending: Surgery

## 2024-01-29 ENCOUNTER — Ambulatory Visit
Admission: RE | Admit: 2024-01-29 | Discharge: 2024-01-29 | Disposition: A | Discharge status: Home / Self Care | Source: Ambulatory Visit | Attending: Surgery | Admitting: Surgery

## 2024-01-29 ENCOUNTER — Other Ambulatory Visit: Payer: Self-pay

## 2024-01-29 DIAGNOSIS — K219 Gastro-esophageal reflux disease without esophagitis: Secondary | ICD-10-CM | POA: Insufficient documentation

## 2024-01-29 DIAGNOSIS — G5601 Carpal tunnel syndrome, right upper limb: Secondary | ICD-10-CM | POA: Insufficient documentation

## 2024-01-29 DIAGNOSIS — G4733 Obstructive sleep apnea (adult) (pediatric): Secondary | ICD-10-CM | POA: Insufficient documentation

## 2024-01-29 DIAGNOSIS — E1141 Type 2 diabetes mellitus with diabetic mononeuropathy: Secondary | ICD-10-CM | POA: Insufficient documentation

## 2024-01-29 DIAGNOSIS — I129 Hypertensive chronic kidney disease with stage 1 through stage 4 chronic kidney disease, or unspecified chronic kidney disease: Secondary | ICD-10-CM | POA: Insufficient documentation

## 2024-01-29 DIAGNOSIS — N183 Chronic kidney disease, stage 3 unspecified: Secondary | ICD-10-CM | POA: Diagnosis not present

## 2024-01-29 DIAGNOSIS — G5621 Lesion of ulnar nerve, right upper limb: Secondary | ICD-10-CM | POA: Insufficient documentation

## 2024-01-29 DIAGNOSIS — Z7985 Long-term (current) use of injectable non-insulin antidiabetic drugs: Secondary | ICD-10-CM | POA: Diagnosis not present

## 2024-01-29 DIAGNOSIS — E1122 Type 2 diabetes mellitus with diabetic chronic kidney disease: Secondary | ICD-10-CM | POA: Insufficient documentation

## 2024-01-29 DIAGNOSIS — E119 Type 2 diabetes mellitus without complications: Secondary | ICD-10-CM

## 2024-01-29 HISTORY — PX: CARPAL TUNNEL RELEASE: SHX101

## 2024-01-29 HISTORY — PX: ULNAR NERVE TRANSPOSITION: SHX2595

## 2024-01-29 LAB — GLUCOSE, CAPILLARY
Glucose-Capillary: 84 mg/dL (ref 70–99)
Glucose-Capillary: 98 mg/dL (ref 70–99)

## 2024-01-29 SURGERY — ULNAR NERVE DECOMPRESSION/TRANSPOSITION
Anesthesia: General | Site: Wrist | Laterality: Right

## 2024-01-29 MED ORDER — OXYCODONE HCL 5 MG/5ML PO SOLN: 5.0000 mg | Freq: Once | ORAL | Status: DC | PRN

## 2024-01-29 MED ORDER — ONDANSETRON HCL 4 MG PO TABS: 4.0000 mg | ORAL_TABLET | Freq: Four times a day (QID) | ORAL | Status: DC | PRN

## 2024-01-29 MED ORDER — DEXAMETHASONE SOD PHOSPHATE PF 10 MG/ML IJ SOLN
INTRAMUSCULAR | Status: AC
  Filled 2024-01-29: qty 1

## 2024-01-29 MED ORDER — ORAL CARE MOUTH RINSE: 15.0000 mL | Freq: Once | OROMUCOSAL | Status: AC

## 2024-01-29 MED ORDER — FENTANYL CITRATE (PF) 100 MCG/2ML IJ SOLN
INTRAMUSCULAR | Status: AC
  Filled 2024-01-29: qty 2

## 2024-01-29 MED ORDER — KETOROLAC TROMETHAMINE 30 MG/ML IJ SOLN
INTRAMUSCULAR | Status: DC | PRN
  Administered 2024-01-29: 30 mg via INTRAVENOUS

## 2024-01-29 MED ORDER — SODIUM CHLORIDE 0.9 % IV SOLN: INTRAVENOUS | Status: DC

## 2024-01-29 MED ORDER — OXYCODONE HCL 5 MG PO TABS: 5.0000 mg | ORAL_TABLET | Freq: Once | ORAL | Status: DC | PRN

## 2024-01-29 MED ORDER — DEXAMETHASONE SOD PHOSPHATE PF 10 MG/ML IJ SOLN
INTRAMUSCULAR | Status: DC | PRN
  Administered 2024-01-29: 10 mg via INTRAVENOUS

## 2024-01-29 MED ORDER — ONDANSETRON HCL 4 MG/2ML IJ SOLN
INTRAMUSCULAR | Status: DC | PRN
  Administered 2024-01-29: 4 mg via INTRAVENOUS

## 2024-01-29 MED ORDER — PROPOFOL 10 MG/ML IV BOLUS
INTRAVENOUS | Status: AC
  Filled 2024-01-29: qty 20

## 2024-01-29 MED ORDER — PROPOFOL 1000 MG/100ML IV EMUL
INTRAVENOUS | Status: AC
  Filled 2024-01-29: qty 100

## 2024-01-29 MED ORDER — DROPERIDOL 2.5 MG/ML IJ SOLN: 0.6250 mg | Freq: Once | INTRAMUSCULAR | Status: DC | PRN

## 2024-01-29 MED ORDER — ONDANSETRON HCL 4 MG/2ML IJ SOLN
INTRAMUSCULAR | Status: AC
  Filled 2024-01-29: qty 2

## 2024-01-29 MED ORDER — MIDAZOLAM HCL 2 MG/2ML IJ SOLN
INTRAMUSCULAR | Status: AC
  Filled 2024-01-29: qty 2

## 2024-01-29 MED ORDER — METOCLOPRAMIDE HCL 10 MG PO TABS: 5.0000 mg | ORAL_TABLET | Freq: Three times a day (TID) | ORAL | Status: DC | PRN

## 2024-01-29 MED ORDER — LIDOCAINE HCL (PF) 2 % IJ SOLN
INTRAMUSCULAR | Status: AC
  Filled 2024-01-29: qty 5

## 2024-01-29 MED ORDER — PROPOFOL 10 MG/ML IV BOLUS
INTRAVENOUS | Status: DC | PRN
  Administered 2024-01-29: 200 mg via INTRAVENOUS
  Administered 2024-01-29: 30 mg via INTRAVENOUS

## 2024-01-29 MED ORDER — LACTATED RINGERS IV SOLN: INTRAVENOUS | Status: DC | PRN

## 2024-01-29 MED ORDER — BUPIVACAINE HCL (PF) 0.5 % IJ SOLN
INTRAMUSCULAR | Status: DC | PRN
  Administered 2024-01-29: 30 mL

## 2024-01-29 MED ORDER — CEFAZOLIN SODIUM-DEXTROSE 2-4 GM/100ML-% IV SOLN
INTRAVENOUS | Status: AC
  Filled 2024-01-29: qty 100

## 2024-01-29 MED ORDER — PROPOFOL 500 MG/50ML IV EMUL
INTRAVENOUS | Status: DC | PRN
  Administered 2024-01-29: 125 ug/kg/min via INTRAVENOUS

## 2024-01-29 MED ORDER — METOCLOPRAMIDE HCL 5 MG/ML IJ SOLN: 5.0000 mg | Freq: Three times a day (TID) | INTRAMUSCULAR | Status: DC | PRN

## 2024-01-29 MED ORDER — CHLORHEXIDINE GLUCONATE 0.12 % MT SOLN
15.0000 mL | Freq: Once | OROMUCOSAL | Status: AC
  Administered 2024-01-29: 15 mL via OROMUCOSAL

## 2024-01-29 MED ORDER — CHLORHEXIDINE GLUCONATE 0.12 % MT SOLN
OROMUCOSAL | Status: AC
  Filled 2024-01-29: qty 15

## 2024-01-29 MED ORDER — 0.9 % SODIUM CHLORIDE (POUR BTL) OPTIME
TOPICAL | Status: DC | PRN
  Administered 2024-01-29: 500 mL

## 2024-01-29 MED ORDER — FENTANYL CITRATE (PF) 100 MCG/2ML IJ SOLN
INTRAMUSCULAR | Status: DC | PRN
  Administered 2024-01-29: 50 ug via INTRAVENOUS
  Administered 2024-01-29: 25 ug via INTRAVENOUS
  Administered 2024-01-29: 50 ug via INTRAVENOUS

## 2024-01-29 MED ORDER — CEFAZOLIN SODIUM-DEXTROSE 2-4 GM/100ML-% IV SOLN
2.0000 g | INTRAVENOUS | Status: AC
  Administered 2024-01-29: 2 g via INTRAVENOUS

## 2024-01-29 MED ORDER — BUPIVACAINE HCL (PF) 0.5 % IJ SOLN
INTRAMUSCULAR | Status: AC
  Filled 2024-01-29: qty 30

## 2024-01-29 MED ORDER — DEXMEDETOMIDINE HCL IN NACL 80 MCG/20ML IV SOLN
INTRAVENOUS | Status: DC | PRN
  Administered 2024-01-29: 4 ug via INTRAVENOUS

## 2024-01-29 MED ORDER — LIDOCAINE HCL (CARDIAC) PF 100 MG/5ML IV SOSY
PREFILLED_SYRINGE | INTRAVENOUS | Status: DC | PRN
  Administered 2024-01-29: 100 mg via INTRAVENOUS

## 2024-01-29 MED ORDER — FENTANYL CITRATE (PF) 100 MCG/2ML IJ SOLN: 25.0000 ug | INTRAMUSCULAR | Status: DC | PRN

## 2024-01-29 MED ORDER — ACETAMINOPHEN 10 MG/ML IV SOLN: 1000.0000 mg | Freq: Once | INTRAVENOUS | Status: DC | PRN

## 2024-01-29 MED ORDER — ONDANSETRON HCL 4 MG/2ML IJ SOLN: 4.0000 mg | Freq: Four times a day (QID) | INTRAMUSCULAR | Status: DC | PRN

## 2024-01-29 MED ORDER — KETOROLAC TROMETHAMINE 15 MG/ML IJ SOLN: 15.0000 mg | Freq: Once | INTRAMUSCULAR | Status: DC

## 2024-01-29 MED ORDER — ACETAMINOPHEN 325 MG PO TABS: 325.0000 mg | ORAL_TABLET | Freq: Four times a day (QID) | ORAL | Status: DC | PRN

## 2024-01-29 MED ORDER — MIDAZOLAM HCL (PF) 2 MG/2ML IJ SOLN
INTRAMUSCULAR | Status: DC | PRN
  Administered 2024-01-29: 2 mg via INTRAVENOUS

## 2024-01-29 SURGICAL SUPPLY — 44 items
BENZOIN TINCTURE PRP APPL 2/3 (GAUZE/BANDAGES/DRESSINGS) ×1 IMPLANT
BLADE SURG 10 STRL SS SAFETY (BLADE) IMPLANT
BNDG COHESIVE 4X5 TAN STRL LF (GAUZE/BANDAGES/DRESSINGS) ×1 IMPLANT
BNDG ELASTIC 2INX 5YD STR LF (GAUZE/BANDAGES/DRESSINGS) ×1 IMPLANT
BNDG ELASTIC 2X5.8 VLCR NS LF (GAUZE/BANDAGES/DRESSINGS) ×1 IMPLANT
BNDG ELASTIC 3INX 5YD STR LF (GAUZE/BANDAGES/DRESSINGS) ×1 IMPLANT
BNDG ESMARCH 4X12 STRL LF (GAUZE/BANDAGES/DRESSINGS) ×1 IMPLANT
CHLORAPREP W/TINT 26 (MISCELLANEOUS) ×2 IMPLANT
CORD BIP STRL DISP 12FT (MISCELLANEOUS) ×1 IMPLANT
CUFF TOURN SGL QUICK 18X4 (TOURNIQUET CUFF) IMPLANT
CUFF TRNQT CYL 24X4X16.5-23 (TOURNIQUET CUFF) IMPLANT
DRAPE SURG 17X11 SM STRL (DRAPES) ×2 IMPLANT
ELECTRODE REM PT RTRN 9FT ADLT (ELECTROSURGICAL) ×1 IMPLANT
FORCEPS JEWEL BIP 4-3/4 STR (INSTRUMENTS) ×1 IMPLANT
GAUZE SPONGE 4X4 12PLY STRL (GAUZE/BANDAGES/DRESSINGS) ×1 IMPLANT
GAUZE XEROFORM 1X8 LF (GAUZE/BANDAGES/DRESSINGS) ×1 IMPLANT
GLOVE BIO SURGEON STRL SZ8 (GLOVE) ×2 IMPLANT
GLOVE BIOGEL PI IND STRL 8 (GLOVE) ×1 IMPLANT
GOWN STRL REUS W/ TWL LRG LVL3 (GOWN DISPOSABLE) ×1 IMPLANT
GOWN STRL REUS W/ TWL XL LVL3 (GOWN DISPOSABLE) ×1 IMPLANT
KIT TURNOVER KIT A (KITS) ×1 IMPLANT
LOOP VESSEL MAXI 1X406 RED (MISCELLANEOUS) ×1 IMPLANT
MANIFOLD NEPTUNE II (INSTRUMENTS) ×1 IMPLANT
NEEDLE HYPO 25X1 1.5 SAFETY (NEEDLE) ×1 IMPLANT
NS IRRIG 500ML POUR BTL (IV SOLUTION) ×1 IMPLANT
PACK EXTREMITY ARMC (MISCELLANEOUS) ×1 IMPLANT
PAD ABD DERMACEA PRESS 5X9 (GAUZE/BANDAGES/DRESSINGS) ×1 IMPLANT
PAD CAST 4YDX4 CTTN HI CHSV (CAST SUPPLIES) ×3 IMPLANT
PENCIL SMOKE EVACUATOR (MISCELLANEOUS) ×1 IMPLANT
SOLN STERILE WATER 500 ML (IV SOLUTION) ×1 IMPLANT
SPLINT LT WRIST LG 8.5 (SOFTGOODS) ×1 IMPLANT
SPLINT LT WRIST MD 8 (SOFTGOODS) IMPLANT
SPLINT LT WRIST XL 8.5+ LP LCK (SOFTGOODS) IMPLANT
SPLINT RT WRIST LG 8.5 (SOFTGOODS) ×1 IMPLANT
SPLINT RT WRIST MD 8 (SOFTGOODS) IMPLANT
SPLINT RT WRIST XL 8.5+ (SOFTGOODS) IMPLANT
STAPLER SKIN PROX 35W (STAPLE) IMPLANT
STOCKINETTE IMPERVIOUS 9X36 MD (GAUZE/BANDAGES/DRESSINGS) ×1 IMPLANT
SUT PROLENE 4 0 PS 2 18 (SUTURE) ×1 IMPLANT
SUT VIC AB 2-0 CT1 (SUTURE) ×1 IMPLANT
SUT VIC AB 2-0 CT1 36 (SUTURE) IMPLANT
SUT VICRYL AB 3-0 FS1 BRD 27IN (SUTURE) IMPLANT
TOWEL OR 17X26 4PK STRL BLUE (TOWEL DISPOSABLE) IMPLANT
TRAP FLUID SMOKE EVACUATOR (MISCELLANEOUS) ×1 IMPLANT

## 2024-01-29 NOTE — Anesthesia Procedure Notes (Signed)
 Procedure Name: LMA Insertion Date/Time: 01/29/2024 10:14 AM  Performed by: Brien Sotero PARAS, CRNAPre-anesthesia Checklist: Patient identified, Emergency Drugs available, Suction available, Patient being monitored and Timeout performed Patient Re-evaluated:Patient Re-evaluated prior to induction Oxygen Delivery Method: Circle system utilized Preoxygenation: Pre-oxygenation with 100% oxygen Induction Type: IV induction Ventilation: Mask ventilation without difficulty LMA: LMA inserted LMA Size: 4.0

## 2024-01-29 NOTE — Anesthesia Preprocedure Evaluation (Signed)
"                                    Anesthesia Evaluation  Patient identified by MRN, date of birth, ID band Patient awake    Reviewed: Allergy & Precautions, H&P , NPO status , Patient's Chart, lab work & pertinent test results, reviewed documented beta blocker date and time   Airway Mallampati: II  TM Distance: >3 FB Neck ROM: full    Dental  (+) Teeth Intact   Pulmonary sleep apnea    Pulmonary exam normal        Cardiovascular Exercise Tolerance: Good hypertension, On Medications (-) angina + DOE  Normal cardiovascular exam Rate:Normal     Neuro/Psych  Headaches PSYCHIATRIC DISORDERS Anxiety Depression     Neuromuscular disease    GI/Hepatic Neg liver ROS,GERD  Medicated,,  Endo/Other  diabetes, Well ControlledHypothyroidism    Renal/GU CRFRenal disease  negative genitourinary   Musculoskeletal   Abdominal   Peds  Hematology negative hematology ROS (+)   Anesthesia Other Findings   Reproductive/Obstetrics negative OB ROS                              Anesthesia Physical Anesthesia Plan  ASA: 3  Anesthesia Plan: General LMA   Post-op Pain Management:    Induction:   PONV Risk Score and Plan: 3  Airway Management Planned:   Additional Equipment:   Intra-op Plan:   Post-operative Plan:   Informed Consent: I have reviewed the patients History and Physical, chart, labs and discussed the procedure including the risks, benefits and alternatives for the proposed anesthesia with the patient or authorized representative who has indicated his/her understanding and acceptance.       Plan Discussed with: CRNA  Anesthesia Plan Comments:         Anesthesia Quick Evaluation  "

## 2024-01-29 NOTE — Discharge Instructions (Addendum)
 Orthopedic discharge instructions: Keep dressings dry and intact. Keep hand elevated above heart level. May shower after dressing removed on postop day 4 (Sunday). Cover sutures with Band-Aids after drying off, then reapply Velcro splint. Apply ice to affected areas frequently. May resume Celebrex 200 mg daily with meals for 3-5 days, then as necessary. Take ES Tylenol  if/when needed.  Resume Eliquis  as prescribed tomorrow morning. Return for follow-up in 10-14 days or as scheduled.

## 2024-01-29 NOTE — H&P (Signed)
 History of Present Illness:  Jacob Lawson is a 69 y.o. male who presents for a history and physical for an upcoming right open carpal tunnel release and a right elbow subcutaneous ulnar nerve transposition to be done by Dr. Edie on January 29, 2024. The patient saw Dr. Edie in November for follow-up of his right hand pain and paresthesias. He notes little change in his symptoms since his last visit nearly 5 months ago. He continues to experience moderate to severe paresthesias to all 5 digits, although he feels that his right hand pain is reasonably manageable at this time. He continues to have difficulty grasping and holding small objects, as well as to perform repetitive activities. He also can taste to have difficulty sleeping at night. Since his last visit, he has undergone an EMG of the right upper extremity and presents today to review these results. The patient is a diabetic.  Current Outpatient Medications:  AMIOdarone (PACERONE) 100 MG tablet Take 1 tablet (100 mg total) by mouth once daily 90 tablet 3  amoxicillin  (AMOXIL ) 500 MG capsule Take 500 mg by mouth 3 (three) times daily  busPIRone (BUSPAR) 7.5 MG tablet TAKE 1 TABLET BY MOUTH TWICE A DAY 180 tablet 0  carvedilol  (COREG ) 6.25 MG tablet TAKE 1 TABLET BY MOUTH TWICE A DAY WITH FOOD 180 tablet 3  cholecalciferol  (VITAMIN D3) 1000 unit capsule Take by mouth once daily  clobetasoL (TEMOVATE) 0.05 % cream 1(ONE) APPLICATION(S) TOPICAL 2(TWO) TIMES A DAY  cyanocobalamin  (VITAMIN B12) 500 MCG tablet Take 1,000 mcg by mouth once daily  ELIQUIS  5 mg tablet TAKE 1 TABLET BY MOUTH EVERY 12 HOURS 180 tablet 3  esomeprazole (NEXIUM) 20 MG DR capsule TAKE 1 CAPS BY MOUTH ONCE DAILY AS NEEDED AROUND EVERY 1-2 DAYS PER MONTH FOR REFLUX 90 capsule 3  finasteride  (PROSCAR ) 5 mg tablet Take 1 tablet by mouth once daily  fluticasone  propionate (FLONASE ) 50 mcg/actuation nasal spray Place 1-2 sprays into both nostrils once daily as needed for Allergies 16  g prn  gabapentin (NEURONTIN) 100 MG capsule TAKE 1 CAPSULE (100 MG TOTAL) BY MOUTH AT BEDTIME FOR 30 DOSES 30 capsule 1  hydroCHLOROthiazide  (HYDRODIURIL ) 25 MG tablet TAKE 1 TABLET BY MOUTH EVERY DAY 90 tablet 3  hydrOXYzine (ATARAX) 25 MG tablet 1(one) tablet(s) oral every night at bedtime  JARDIANCE 10 mg tablet TAKE 1 TABLET BY MOUTH EVERY DAY 30 tablet 11  levothyroxine  (SYNTHROID ) 175 MCG tablet Take 1 tablet (175 mcg total) by mouth every morning for thyroid  90 tablet 3  rosuvastatin  (CRESTOR ) 5 MG tablet Take 1 tablet (5 mg total) by mouth once daily 90 tablet 3  sertraline  (ZOLOFT ) 100 MG tablet Take 100 mg by mouth once daily  tadalafiL  (CIALIS ) 20 MG tablet TAKE 1 TABLET BY MOUTH ONCE DAILY AS NEEDED FOR ERECTILE DYSFUNCTION 30 tablet 3  tamsulosin  (FLOMAX ) 0.4 mg capsule Take 0.4 mg by mouth once daily  tirzepatide (MOUNJARO) 7.5 mg/0.5 mL pen injector Inject 0.5 mLs (7.5 mg total) subcutaneously once a week 2 mL 4  valsartan (DIOVAN) 80 MG tablet TAKE 1 TABLET (80 MG TOTAL) BY MOUTH ONCE DAILY. 90 tablet 3  XYOSTED  100 mg/0.5 mL subcutanoues auto-injector Inject 100 mg subcutaneously once a week  zinc  sulfate (ZINCATE) 50 mg zinc  (220 mg) capsule 50 mg   Allergies:  Lamotrigine Hives  Simvastatin Muscle Pain and Other (Muscle aches)  Statins-Hmg-Coa Reductase Inhibitors Muscle Pain and Other (See Comments)  Simvastatin, pravastatin and others Muscle/joint aches  Past Medical History:  Allergic rhinitis  Aortic atherosclerosis 11/14/2017  3.0 x 3.0 cm aneurysm on 08/07/2017 [facility] screening; no aneurysm on 11/14/2017 CT, but did show atherosclerosis  Bacteremia due to Gram-negative bacteria 11/14/2018  Due to infected puncture wound of left foot; admitted at [facility] 10/16 to 11/10/2018 and treated with I&D, IV Unasyn  x 10 days  Carpal tunnel syndrome of left wrist 11/12/2017  Carpal tunnel syndrome, bilateral  S/P R CT release  Chronic low back pain  Chronic right-sided  low back pain with right-sided sciatica 01/29/2017  Chronic urticaria  Colloid thyroid  nodule 07/11/2016  Left thyroid  nodule, biopsied by Dr. Edda; folow up annually  Complex tear of medial meniscus of right knee as current injury 02/22/2020  CRI (chronic renal insufficiency), stage 3 (moderate) 09/06/2016  11/18/2018 est GFR 56  Degenerative tear of glenoid labrum of right shoulder 07/16/2019  Depression Mild  Diabetes mellitus type 2, uncomplicated (CMS/HHS-HCC)  Ectatic abdominal aorta () 11/14/2017  On 11/14/2017 CT; radiologist advised ultrasound in 2024  Essential hypertension, benign 08/13/2013  Fatty liver 02/20/2017  Suggested by appearance on 02/19/2017 ultrasound  Ganglion of flexor tendon sheath of right middle finger 08/20/2018  GERD (gastroesophageal reflux disease)  with gastritis on 10/2008 EGD  Hematologic abnormality 2025  Heart  High risk medication use 02/05/2014  Testosterone , simvastatin, lisinopril   Hyperlipidemia  Hyperlipidemia associated with type 2 diabetes mellitus (CMS/HHS-HCC)  Hypertension associated with type 2 diabetes mellitus (CMS/HHS-HCC) 08/13/2013  Hypogonadism in male  S/P Testim  implant 03/2012  Hypothyroidism  Left carpal tunnel syndrome 08/13/2013  S/P R CT release  Migraine headache  Nontraumatic incomplete tear of right rotator cuff 04/15/2019  Obesity (BMI 30-39.9), unspecified 01/29/2017  Occipital neuralgia (Dr. Maree)  Primary osteoarthritis of first carpometacarpal joint of right hand 08/20/2018  Primary osteoarthritis of right knee 02/22/2020  Pulmonary nodules 04/29/2016 On 03/31/2016 CT, 2 mm in RLL, 4 mm in left mid-lung; no need for follow-up if at low risk for lung cancer (as he is)  Renal cyst, left 02/20/2017 - On 02/19/2017 ultrasound ordered by Dr. Kassie  Rotator cuff tendinitis, right 04/15/2019  Sleep apnea (OSA on CPAP)  Tendinitis of upper biceps tendon of right shoulder 07/16/2019  Trigger middle finger of right  hand 09/24/2019  Type 2 diabetes mellitus with stage 3 chronic kidney disease, without long-term current use of insulin  (CMS/HHS-HCC)  Vitiligo   Past Surgical History:  COLONOSCOPY 03/19/1989 (Normal Colon)  COLONOSCOPY 07/04/1998 (Adenomatous Polyp)  COLONOSCOPY 02/04/2002 (PH Adenomatous Polyp)  ENDOSCOPIC CARPAL TUNNEL RELEASE Right 11/2003  ARTHROSCOPIC ROTATOR CUFF REPAIR Left 02/2005  Excision of neuroma Left foot 06/2007  BIOPSY PROSTATE NEEDLE/PUNCH 12/2015 (Dr. Waddell-- normal result)  COLONOSCOPY 02/26/2017 (Adenomatous Polyps: CBF 02/2020)  ENDOSCOPIC CARPAL TUNNEL RELEASE Left 11/19/2017 (Dr. CECILE Glenn)  Limited arthroscopic debridement, arthroscopic subacromial decompression, mini-open rotator cuff repair, and mini-open biceps tenodesis, right shoulder. Right 07/16/2019 (Dr. Edie)  Excision of the ganglion right long finger flexor sheath with release right long trigger finger. Right 09/23/2019 (Dr. Edie)  COLONOSCOPY 03/07/2020 (Diverticulosis/Otherwise normal/PHx CP/Repeat 65yrs/CTL)  EGD 03/07/2020 (Fundic Gland polyp/Repeat as needed/CTL)  Arthroscopic medial meniscus repair with partial medial meniscectomy and abrasion chondroplasty of grade III-IV chondromalacia of femoral trochlea and grade III chondromalacia of medial femoral condyle, right knee. Right 03/22/2020 (Dr. Edie)  Arthroscopic partial medial meniscectomy with abrasion chondroplasty of grade III chondromalacia femoral trochlea and patellar ridge, right knee Right 05/25/2021 (Dr. Edie)  Left mini-open rotator cuff reconstruction with allograft interpostion, biceps tenodesis, subacromial  decompression, extensive debridement of shoulder, distal clavicle excision Left 09/07/2022 (Dr. Tobie)  Left shoulder open I&D of anteiror portal with arthrotomy, arthroscopic I&D, extensive debridement, complete synovectomy of shoulder Left 11/20/2022 (Dr. Tobie)  Left reverse total shoulder arthroplasty, biceps tenodesis Left  05/07/2023 (Dr. Tobie)  APPENDECTOMY  COLONOSCOPY 01/21/2012, 10/28/2008, 08/19/2006 (Adenomatous Polyps: CBF 12/2016)  Congenital finger webbing surgery  EGD 10/28/2008, 08/19/2006, 02/04/2002, 07/04/1998, 01/24/1993, 03/19/1989  Mandibular surgery in his late 30's for overbite  Partial thyroidectomy for benign thyroid  nodule  TONSILLECTOMY AND ADENOIDECTOMY  VASECTOMY ?   Family History:  Cancer Maternal Grandfather  oral cancer  Pancreatic cancer Father PW Dorin 4  Prostate cancer Father PW Mcinerney  Dementia Mother  Prostate cancer Brother  Bipolar disorder Other  Ovarian cancer Sister  Brain cancer Paternal Aunt   Social History:   Socioeconomic History:  Marital status: Married  Tobacco Use  Smoking status: Never  Smokeless tobacco: Never  Vaping Use  Vaping status: Never Used  Substance and Sexual Activity  Alcohol use: Not Currently  Comment: Occasional  Drug use: Never  Sexual activity: Yes  Partners: Female  Birth control/protection: None   Social Drivers of Health:   Physicist, Medical Strain: Low Risk (08/29/2023)  Overall Financial Resource Strain (CARDIA)  Difficulty of Paying Living Expenses: Not hard at all  Food Insecurity: No Food Insecurity (08/29/2023)  Hunger Vital Sign  Worried About Running Out of Food in the Last Year: Never true  Ran Out of Food in the Last Year: Never true  Transportation Needs: No Transportation Needs (08/29/2023)  PRAPARE - Risk Analyst (Medical): No  Lack of Transportation (Non-Medical): No   Review of Systems:  A comprehensive 14 point ROS was performed, reviewed, and the pertinent orthopaedic findings are documented in the HPI.  Physical Exam: Vitals:  01/24/24 0825  BP: 120/82  Weight: (!) 102.1 kg (225 lb)  Height: 182.9 cm (6')  PainSc: 0-No pain  PainLoc: Hand   General/Constitutional: The patient appears to be well-nourished, well-developed, and in no acute distress. Neuro/Psych:  Normal mood and affect, oriented to person, place and time. Eyes: Non-icteric. Pupils are equal, round, and reactive to light, and exhibit synchronous movement. ENT: Unremarkable. Lymphatic: No palpable adenopathy. Respiratory: Lungs clear to auscultation, Normal chest excursion, No wheezes, and Non-labored breathing Cardiovascular: Regular rate and rhythm. No murmurs. and No edema, swelling or tenderness, except as noted in detailed exam. Integumentary: No impressive skin lesions present, except as noted in detailed exam. Musculoskeletal: Unremarkable, except as noted in detailed exam.  Heart: Examination of the heart reveals regular, rate, and rhythm. There is no murmur noted on ascultation. There is a normal apical pulse.  Lungs: Lungs are clear to auscultation. There is no wheeze, rhonchi, or crackles. There is normal expansion of bilateral chest walls.   Right elbow exam: Skin inspection of the right elbow again is unremarkable. No swelling, erythema, ecchymosis, abrasions, or other skin abnormalities are identified, and he has no elbow effusion. He demonstrates ull active and passive range of motion of the elbow without any pain or catching. He denies any tenderness to palpation around the elbow, but again exhibits a positive Tinel's over the cubital tunnel.  Right wrist/hand exam: Skin inspection of the right wrist and hand again is notable for well-healed surgical incisions over the palmar aspect of his hand, consistent with his history of a prior carpal tunnel release, as well as prior thumb and long trigger finger releases. No swelling, erythema, ecchymosis,  rations, or other skin abnormalities are identified. He again exhibits mild atrophy of the first dorsal interosseous muscle, as well as along the radial margin of the thenar eminence. He notes focal tenderness to palpation over the palmar aspect of the hand in the area of the ring metacarpal head. A small lump is noted in this area,  possibly consistent with either a seed ganglion or tenosynovitis of his flexor sheath. Otherwise, he denies any tenderness to palpation over the dorsal or palmar aspects of the wrist or hand. He has near full range of motion of the wrist without any pain or catching. He can actively flex and extend all digits fully without any obvious triggering. He remains grossly neurovascularly intact to all digits, other than decreased sensation to light touch along the volar aspect of the little finger into the palm. He exhibits a positive Tinel's over the volar aspect of the wrist.  EMG results:  A recent EMG of the right upper extremity is available for review and has been reviewed by myself. By report, the study demonstrate evidence of severe recurrent carpal tunnel syndrome as well as severe cubital tunnel syndrome. This report was reviewed by myself and discussed with the patient.  Assessment: 1. Cubital tunnel syndrome, right.  2. Carpal tunnel syndrome, right.  3. Type 2 diabetes mellitus with diabetic neuropathy, without long-term current use of insulin .   Plan: Regarding the numbness and paresthesias to his right hand, the patient is quite frustrated by the use symptoms and functional limitations, and is ready to consider more aggressive treatment options. Therefore, I have recommended a surgical procedure, specifically a repeat open right carpal tunnel release as well as a subcutaneous anterior transposition of the ulnar nerve at the right elbow. The procedure was discussed with the patient, as were the potential risks (including bleeding, infection, nerve and/or blood vessel injury, persistent or recurrent pain/paresthesias, weakness of grip, stiffness of his fingers, need for further surgery, blood clots, strokes, heart attacks and/or arhythmias, pneumonia, etc.) and benefits. The patient states his understanding and wishes to proceed. All of the patient's questions and concerns were answered. He can  call any time with further concerns. He will follow up post-surgery, routine.    H&P reviewed and patient re-examined. No changes.

## 2024-01-29 NOTE — Op Note (Signed)
 01/29/2024  12:18 PM  Patient:   Jacob Lawson  Pre-Op Diagnosis:   1. Right cubital tunnel syndrome.  2. Recurrent right carpal tunnel syndrome.  Post-Op Diagnosis:   Same  Procedure:   1.  Decompression of ulnar nerve at the cubital tunnel, right elbow.  2.  Revision open right carpal tunnel release.  Surgeon:   DOROTHA Reyes Maltos, MD  Assistant:   None  Anesthesia:   General LMA  Findings:   As above.  Complications:   None  EBL:   1 cc  Fluids:   900 cc crystalloid  TT:   89 minutes at 250 mmHg  Drains:   None  Closure:   Staples  Brief Clinical Note:   The patient is a 69 year old male with a long history of right hand pain and paresthesias.  The symptoms have persisted spite medications, activity modification, etc. The patient's history and examination were consistent with cubital tunnel syndrome, confirmed by an EMG. The patient presents at this time for decompression and subcutaneous anterior transposition of the ulnar nerve at the right elbow.  The patient's EMG also demonstrated recurrent severe right carpal tunnel syndrome. Therefore, he presents at this time for a revision open right carpal tunnel release.  Procedure:   The patient was brought into the operating room and lain in the supine position. After adequate general laryngal mask anesthesia was obtained, the patient's right upper extremity was prepped with ChloraPrep solution before being draped sterilely. Preoperative antibiotics were administered. After performing a timeout to verify the appropriate surgical site, the limb was exsanguinated with an Esmarch and the tourniquet inflated to 250 mmHg.   The ulnar nerve at the right elbow was addressed first. An approximately 7-8 cm curvilinear incision was made along the course of the ulnar nerve posterior to the medial epicondyle. The incision was carried down through the subcutaneous tissues with care taken to avoid the small branches of the medial antebrachial  nerve to expose the sheath overlying the cubital tunnel. The ulnar nerve was identified at the proximal end of the tunnel and was dissected free. The nerve was then carefully followed as the roof of the cubital tunnel was released from proximal to distal. Distally, the fascia overlying the pronator muscle was released for several centimeters. The nerve was clearly thickened just proximal to the cubital tunnel, indicating the most likely source of the nerve compression. A vessel loop was passed around the nerve and used to provide gentle traction on the nerve while circumferential dissection was carried out under loupe magnification using bipolar electrocautery and tenotomy scissors.   Once the nerve was fully mobilized, the anterior tissues were elevated as a flap just superficial to the fascia overlying the common flexor origin and a pocket created to accept the nerve. However, despite careful dissection both proximally and distally, it appeared that the nerve was still somewhat tight with the anterior transposition. Therefore, it was elected not to transpose the nerve. The elbow was flexed and extended several times and there was no evidence for nerve subluxation or instability. Therefore, it was elected to leave the nerve in situ and not transpose it.    The wound was copiously irrigated with sterile saline solution before the subcutaneous tissues were closed using 2-0 Vicryl interrupted sutures. The skin was closed using 3-0 Vicryl subcuticular sutures before benzoin and Steri-Strips were applied to the skin. A total of 20 cc of 0.5% plain Sensorcaine  was injected in and around the incision to help with  postoperative analgesia.   Next, the carpal tunnel was addressed. Utilizing the previous incision on the volar aspect of the palm, the incision was extended proximally in a zigzag fashion over the wrist flexor crease into the distal forearm. The incision was carried down through the subcutaneous tissues  with care taken to identify and protect any neurovascular structures. The distal forearm fascia was penetrated just proximal to the transverse carpal ligament. The soft tissues were released off the superficial and deep surfaces of the distal forearm fascia and this was released proximally for 3-4 cm under direct visualization. The nerve was visualized directly before a Therapist, nutritional was passed beneath the transverse carpal ligament along the ulnar aspect of the carpal tunnel and used to release any adhesions as well as to remove any adherent synovial tissue. It also was used to protect the underlying nerve as the recurrent scar tissue and residual transverse carpal ligament was transected in line with the incision. Several adhesions along the ulnar side of the nerve were released to allow the nerve to move more freely within the carpal tunnel. Hemostasis was achieved using bipolar electrocautery.  This wound was irrigated thoroughly with sterile saline solution before being closed using 4-0 Prolene interrupted sutures. A total of 10 cc of 0.5% plain Sensorcaine  was injected in and around the incision before a sterile bulky dressing was applied to the right wrist wound. A sterile bulky dressing also was applied to the right elbow before the patient was awakened, extubated, and returned to the recovery room in satisfactory condition after tolerating the procedure well.

## 2024-01-29 NOTE — Transfer of Care (Signed)
 Immediate Anesthesia Transfer of Care Note  Patient: Jacob Lawson  Procedure(s) Performed: ULNAR NERVE DECOMPRESSION (Right: Wrist) CARPAL TUNNEL RELEASE (Right: Wrist)  Patient Location: PACU  Anesthesia Type:MAC and General  Level of Consciousness: awake  Airway & Oxygen Therapy: Patient Spontanous Breathing and Patient connected to face mask oxygen  Post-op Assessment: Report given to RN and Post -op Vital signs reviewed and stable  Post vital signs: Reviewed and stable  Last Vitals:  Vitals Value Taken Time  BP 142/78 01/29/24 12:15  Temp    Pulse 46 01/29/24 12:17  Resp 10 01/29/24 12:17  SpO2 97 % 01/29/24 12:17  Vitals shown include unfiled device data.  Last Pain:  Vitals:   01/29/24 0813  PainSc: 0-No pain         Complications: No notable events documented.

## 2024-01-30 ENCOUNTER — Encounter: Payer: Self-pay | Admitting: Surgery

## 2024-01-30 NOTE — Anesthesia Postprocedure Evaluation (Signed)
"   Anesthesia Post Note  Patient: Jacob Lawson  Procedure(s) Performed: ULNAR NERVE DECOMPRESSION (Right: Wrist) CARPAL TUNNEL RELEASE (Right: Wrist)  Patient location during evaluation: PACU Anesthesia Type: General Level of consciousness: awake and alert Pain management: pain level controlled Vital Signs Assessment: post-procedure vital signs reviewed and stable Respiratory status: spontaneous breathing, nonlabored ventilation, respiratory function stable and patient connected to nasal cannula oxygen Cardiovascular status: blood pressure returned to baseline and stable Postop Assessment: no apparent nausea or vomiting Anesthetic complications: no   No notable events documented.   Last Vitals:  Vitals:   01/29/24 1245 01/29/24 1310  BP: (!) 161/89 (!) 138/92  Pulse: (!) 52 (!) 56  Resp: 14 20  Temp: (!) 36.1 C (!) 36.4 C  SpO2: 97% 93%    Last Pain:  Vitals:   01/29/24 1310  TempSrc: Temporal  PainSc: 0-No pain                 Lynwood KANDICE Clause      "

## 2024-03-19 ENCOUNTER — Ambulatory Visit: Admitting: Infectious Diseases

## 2024-04-09 ENCOUNTER — Other Ambulatory Visit
